# Patient Record
Sex: Female | Born: 1965
Health system: Southern US, Community
[De-identification: ages and names within clinical notes are randomized; demographics above are authoritative.]

## PROBLEM LIST (undated history)

## (undated) DIAGNOSIS — R7303 Prediabetes: Secondary | ICD-10-CM

## (undated) DIAGNOSIS — M25569 Pain in unspecified knee: Secondary | ICD-10-CM

## (undated) DIAGNOSIS — Z87442 Personal history of urinary calculi: Secondary | ICD-10-CM

## (undated) DIAGNOSIS — G2581 Restless legs syndrome: Secondary | ICD-10-CM

## (undated) DIAGNOSIS — T7840XA Allergy, unspecified, initial encounter: Secondary | ICD-10-CM

## (undated) DIAGNOSIS — R12 Heartburn: Secondary | ICD-10-CM

## (undated) DIAGNOSIS — G43909 Migraine, unspecified, not intractable, without status migrainosus: Secondary | ICD-10-CM

## (undated) DIAGNOSIS — Z96651 Presence of right artificial knee joint: Secondary | ICD-10-CM

## (undated) DIAGNOSIS — Z6841 Body Mass Index (BMI) 40.0 and over, adult: Secondary | ICD-10-CM

## (undated) DIAGNOSIS — G47419 Narcolepsy without cataplexy: Secondary | ICD-10-CM

## (undated) DIAGNOSIS — G4730 Sleep apnea, unspecified: Secondary | ICD-10-CM

## (undated) DIAGNOSIS — F32A Depression, unspecified: Secondary | ICD-10-CM

## (undated) DIAGNOSIS — M1990 Unspecified osteoarthritis, unspecified site: Secondary | ICD-10-CM

## (undated) DIAGNOSIS — M1712 Unilateral primary osteoarthritis, left knee: Secondary | ICD-10-CM

## (undated) DIAGNOSIS — R002 Palpitations: Secondary | ICD-10-CM

## (undated) DIAGNOSIS — H269 Unspecified cataract: Secondary | ICD-10-CM

## (undated) DIAGNOSIS — E559 Vitamin D deficiency, unspecified: Secondary | ICD-10-CM

## (undated) DIAGNOSIS — B001 Herpesviral vesicular dermatitis: Secondary | ICD-10-CM

## (undated) DIAGNOSIS — B002 Herpesviral gingivostomatitis and pharyngotonsillitis: Secondary | ICD-10-CM

## (undated) DIAGNOSIS — G8929 Other chronic pain: Secondary | ICD-10-CM

## (undated) DIAGNOSIS — I10 Essential (primary) hypertension: Secondary | ICD-10-CM

## (undated) HISTORY — DX: Herpesviral gingivostomatitis and pharyngotonsillitis: B00.2

## (undated) HISTORY — DX: Restless legs syndrome: G25.81

## (undated) HISTORY — DX: Unilateral primary osteoarthritis, left knee: M17.12

## (undated) HISTORY — DX: Narcolepsy without cataplexy: G47.419

## (undated) HISTORY — DX: Migraine, unspecified, not intractable, without status migrainosus: G43.909

## (undated) HISTORY — DX: Palpitations: R00.2

## (undated) HISTORY — DX: Unspecified osteoarthritis, unspecified site: M19.90

## (undated) HISTORY — DX: Sleep apnea, unspecified: G47.30

## (undated) HISTORY — DX: Vitamin D deficiency, unspecified: E55.9

## (undated) HISTORY — DX: Unspecified cataract: H26.9

## (undated) HISTORY — DX: Heartburn: R12

## (undated) HISTORY — DX: Pain in unspecified knee: M25.569

## (undated) HISTORY — DX: Morbid (severe) obesity due to excess calories: E66.01

## (undated) HISTORY — DX: Prediabetes: R73.03

## (undated) HISTORY — DX: Depression, unspecified: F32.A

## (undated) HISTORY — DX: Allergy, unspecified, initial encounter: T78.40XA

## (undated) HISTORY — DX: Herpesviral vesicular dermatitis: B00.1

## (undated) HISTORY — DX: Presence of right artificial knee joint: Z96.651

## (undated) HISTORY — PX: OTHER SURGICAL HISTORY: SHX169

## (undated) HISTORY — DX: Other chronic pain: G89.29

## (undated) HISTORY — DX: Body Mass Index (BMI) 40.0 and over, adult: Z684

## (undated) SURGERY — OOPHORECTOMY, LAPAROSCOPIC
Anesthesia: General

---

## 2003-12-08 ENCOUNTER — Other Ambulatory Visit: Payer: Self-pay

## 2004-08-12 ENCOUNTER — Ambulatory Visit: Payer: Self-pay | Admitting: Obstetrics and Gynecology

## 2004-10-03 ENCOUNTER — Ambulatory Visit: Payer: Self-pay | Admitting: Obstetrics and Gynecology

## 2005-03-14 ENCOUNTER — Ambulatory Visit: Payer: Self-pay | Admitting: Psychiatry

## 2005-04-02 ENCOUNTER — Ambulatory Visit: Payer: Self-pay | Admitting: Psychiatry

## 2007-01-21 DIAGNOSIS — D239 Other benign neoplasm of skin, unspecified: Secondary | ICD-10-CM

## 2007-01-21 HISTORY — DX: Other benign neoplasm of skin, unspecified: D23.9

## 2008-02-18 ENCOUNTER — Ambulatory Visit: Payer: Self-pay | Admitting: Obstetrics and Gynecology

## 2008-10-23 HISTORY — PX: ABDOMINAL HYSTERECTOMY: SHX81

## 2011-01-24 ENCOUNTER — Ambulatory Visit: Payer: Self-pay | Admitting: Family Medicine

## 2012-02-23 ENCOUNTER — Ambulatory Visit: Payer: Self-pay | Admitting: Orthopedic Surgery

## 2013-02-21 ENCOUNTER — Encounter: Payer: Self-pay | Admitting: Nurse Practitioner

## 2013-02-21 ENCOUNTER — Ambulatory Visit (INDEPENDENT_AMBULATORY_CARE_PROVIDER_SITE_OTHER): Payer: No Typology Code available for payment source | Admitting: Nurse Practitioner

## 2013-02-21 VITALS — BP 142/80 | HR 64 | Ht 64.0 in | Wt 231.0 lb

## 2013-02-21 DIAGNOSIS — G43009 Migraine without aura, not intractable, without status migrainosus: Secondary | ICD-10-CM

## 2013-02-21 DIAGNOSIS — G47419 Narcolepsy without cataplexy: Secondary | ICD-10-CM | POA: Insufficient documentation

## 2013-02-21 NOTE — Progress Notes (Signed)
HPI: Patient returns for followup after last visit with Dr. Vickey Huger on 06/26/2012. She has a history of narcolepsy. She has been unable to take Provigil. She is currently on Xyrem and doing well.  ROS:  Restless legs  Physical Exam General: well developed, well nourished, seated, in no evident distress Head: head normocephalic and atraumatic. Oropharynx benign Neck: supple with no carotid or supraclavicular bruits Cardiovascular: regular rate and rhythm, no murmurs  Neurologic Exam Mental Status: Awake and fully alert. Oriented to place and time. ESS 9. Cranial Nerves: Pupils equal, briskly reactive to light. Extraocular movements full without nystagmus. Visual fields full to confrontation. Hearing intact and symmetric to finger snap. Facial sensation intact. Face, tongue, palate move normally and symmetrically. Neck flexion and extension normal.  Motor: Normal bulk and tone. Normal strength in all tested extremity muscles. Sensory.: intact to touch and pinprick and vibratory.  Coordination: Rapid alternating movements normal in all extremities. Finger-to-nose and heel-to-shin performed accurately bilaterally. Gait and Station: Arises from chair without difficulty. Stance is normal. Gait demonstrates normal stride length and balance . Able to heel, toe and tandem walk without difficulty.  Reflexes: 1+ and symmetric. Toes downgoing.     ASSESSMENT: Narcolepsy without cataplexy.Epworth 9.    PLAN: Pt to continue Xyrem. ESS 9. Which has improved F/U in 6 months with Dr. Leander Rams Darrol Angel, GNP-BC APRN

## 2013-02-21 NOTE — Patient Instructions (Addendum)
Pt to continue Xyrem. ESS 9. Which has improved F/U in 6 months with Dr. Vickey Huger

## 2013-02-24 NOTE — Progress Notes (Signed)
I, Shirel Mallis, MD agree with assessment and management as outlined in this  note.   

## 2013-02-24 NOTE — Progress Notes (Signed)
Lazarus Salines, MD agree with assessment and management as outlined in this  note.

## 2013-03-03 ENCOUNTER — Telehealth: Payer: Self-pay | Admitting: Nurse Practitioner

## 2013-05-14 ENCOUNTER — Ambulatory Visit: Payer: Self-pay | Admitting: Obstetrics and Gynecology

## 2013-05-14 LAB — BASIC METABOLIC PANEL
Calcium, Total: 9.1 mg/dL (ref 8.5–10.1)
Chloride: 104 mmol/L (ref 98–107)
Co2: 28 mmol/L (ref 21–32)
Creatinine: 0.81 mg/dL (ref 0.60–1.30)
Glucose: 77 mg/dL (ref 65–99)
Osmolality: 273 (ref 275–301)
Potassium: 4.2 mmol/L (ref 3.5–5.1)

## 2013-05-14 LAB — CBC
HCT: 37.9 % (ref 35.0–47.0)
HGB: 12.7 g/dL (ref 12.0–16.0)
RBC: 4.58 10*6/uL (ref 3.80–5.20)
RDW: 14.1 % (ref 11.5–14.5)
WBC: 8.8 10*3/uL (ref 3.6–11.0)

## 2013-05-22 ENCOUNTER — Ambulatory Visit: Payer: Self-pay | Admitting: Obstetrics and Gynecology

## 2013-05-23 LAB — HEMATOCRIT: HCT: 34 % — ABNORMAL LOW (ref 35.0–47.0)

## 2013-05-23 LAB — BASIC METABOLIC PANEL
Calcium, Total: 8.4 mg/dL — ABNORMAL LOW (ref 8.5–10.1)
EGFR (African American): >60
Glucose: 68 mg/dL (ref 65–99)
Osmolality: 271 (ref 275–301)

## 2013-08-27 ENCOUNTER — Ambulatory Visit: Payer: No Typology Code available for payment source | Admitting: Neurology

## 2013-10-08 ENCOUNTER — Ambulatory Visit: Payer: No Typology Code available for payment source | Admitting: Neurology

## 2013-12-11 NOTE — Telephone Encounter (Signed)
Pt r/s apt closing encounter

## 2013-12-17 ENCOUNTER — Ambulatory Visit: Payer: No Typology Code available for payment source | Admitting: Neurology

## 2014-03-19 ENCOUNTER — Telehealth: Payer: Self-pay

## 2014-03-19 NOTE — Telephone Encounter (Signed)
Xyrem called regarding Rx.  They said the patient will not have ins after June 1 and would like to get auth to send refill before ins terminates.  I spoke with the pharmacist, Raven.  Gave verbal auth to refill med.  Patient has an appt scheduled in Sept.

## 2014-04-01 ENCOUNTER — Other Ambulatory Visit: Payer: Self-pay | Admitting: Neurology

## 2014-04-01 MED ORDER — SODIUM OXYBATE 500 MG/ML PO SOLN: 4500.0000 mg | Freq: Two times a day (BID) | ORAL | Status: DC

## 2014-06-24 ENCOUNTER — Ambulatory Visit (INDEPENDENT_AMBULATORY_CARE_PROVIDER_SITE_OTHER): Payer: No Typology Code available for payment source | Admitting: Neurology

## 2014-06-24 ENCOUNTER — Encounter: Payer: Self-pay | Admitting: Neurology

## 2014-06-24 VITALS — BP 121/67 | HR 68 | Resp 16 | Ht 64.0 in | Wt 238.0 lb

## 2014-06-24 DIAGNOSIS — R0609 Other forms of dyspnea: Secondary | ICD-10-CM

## 2014-06-24 DIAGNOSIS — G43909 Migraine, unspecified, not intractable, without status migrainosus: Secondary | ICD-10-CM

## 2014-06-24 DIAGNOSIS — R0989 Other specified symptoms and signs involving the circulatory and respiratory systems: Secondary | ICD-10-CM

## 2014-06-24 DIAGNOSIS — R0683 Snoring: Secondary | ICD-10-CM

## 2014-06-24 DIAGNOSIS — G47411 Narcolepsy with cataplexy: Secondary | ICD-10-CM

## 2014-06-24 DIAGNOSIS — E669 Obesity, unspecified: Secondary | ICD-10-CM

## 2014-06-24 DIAGNOSIS — D509 Iron deficiency anemia, unspecified: Secondary | ICD-10-CM

## 2014-06-24 DIAGNOSIS — G2581 Restless legs syndrome: Secondary | ICD-10-CM

## 2014-06-24 MED ORDER — RIZATRIPTAN BENZOATE 10 MG PO TABS: 10.0000 mg | ORAL_TABLET | ORAL | Status: DC | PRN

## 2014-06-24 NOTE — Patient Instructions (Signed)
I would like for her to repeat a sleep study at her convenience followed by an MS LT.  I know they will have to wean her off the XYREM  medication for at least 7 days prior to a sleep study to get a valid MSLT to follow .  Xyrem is already weaned. Alternatively I would like to look if there is any apnea present now since the patient has gained some weight over the last year , for today  I do a HLA narcolepsy panel.  This would not require the weaning off medication. There is high likelihood that she has apnea , she  snores now.  She is willing to wean off Zoloft to 25 mg daily and than in 14 days to 25 mg every other day for 8 days than off.    Polysomnography (Sleep Studies) Polysomnography (PSG) is a series of tests used for detecting (diagnosing) obstructive sleep apnea and other sleep disorders. The tests measure how some parts of your body are working while you are sleeping. The tests are extensive and expensive. They are done in a sleep lab or hospital, and vary from center to center. Your caregiver may perform other more simple sleep studies and questionnaires before doing more complete and involved testing. Testing may not be covered by insurance. Some of these tests are:  An EEG (Electroencephalogram). This tests your brain waves and stages of sleep.  An EOG (Electrooculogram). This measures the movements of your eyes. It detects periods of REM (rapid eye movement) sleep, which is your dream sleep.  An EKG (Electrocardiogram). This measures your heart rhythm.  EMG (Electromyography). This is a measurement of how the muscles are working in your upper airway and your legs while sleeping.  An oximetry measurement. It measures how much oxygen (air) you are getting while sleeping.  Breathing efforts may be measured. The same test can be interpreted (understood) differently by different caregivers and centers that study sleep.  Studies may be given an apnea/hypopnea index (AHI). This is a  number which is found by counting the times of no breathing or under breathing during the night, and relating those numbers to the amount of time spent in bed. When the AHI is greater than 15, the patient is likely to complain of daytime sleepiness. When the AHI is greater than 30, the patient is at increased risk for heart problems and must be followed more closely. Following the AHI also allows you to know how treatment is working. Simple oximetry (tracking the amount of oxygen that is taken in) can be used for screening patients who:  Do not have symptoms (problems) of OSA.  Have a normal Epworth Sleepiness Scale Score.  Have a low pre-test probability of having OSA.  Have none of the upper airway problems likely to cause apnea.  Oximetry is also used to determine if treatment is effective in patients who showed significant desaturations (not getting enough oxygen) on their home sleep study. One extra measure of safety is to perform additional studies for the person who only snores. This is because no one can predict with absolute certainty who will have OSA. Those who show significant desaturations (not getting enough oxygen) are recommended to have a more detailed sleep study. Document Released: 04/15/2003 Document Revised: 01/01/2012 Document Reviewed: 12/15/2013 [facility] Patient Information 2015 [place], [place]. This information is not intended to replace advice given to you by your health care provider. Make sure you discuss any questions you have with your health care provider. Marland Kitchen

## 2014-06-24 NOTE — Progress Notes (Signed)
SLEEP MEDICINE CLINIC   Provider:  Larey Seat, M D  Referring Provider: Dear, Trude Mcburney, MD Primary Care Physician:  Dear, Trude Mcburney, MD  Chief Complaint  Patient presents with  . Follow-up    Room 11  . narcolepsy  . Restless leg syndrome    HPI:  Amy Mooney is a 48 y.o. female  Is seen here as a  6 month  revisit  from Dr. Dear for narcolepsy follow up,  [name] was originally diagnosed with narcolepsy in the year 2008 in [place]. The patient had a polysomnography which at the time did not confirm apnea or restless legs. The study was followed 14 days later by an MSLT in [place], which was deemed to be abnormal.  however I have never been able to repeat the study -in the past it was not necessary,  the patient responded well to Xyrem,  after initially failing to respond to Provigil or Nuvigil  The patient endorsed the Epworth score at the upper limits at 22 and 19 points- now on Xyrem the  score is in the single digits.  I would like for her to repeat a sleep study at her convenience followed by an MS LT I know they will have to wean her off the medication for at least 7 days prior to a sleep study to get a valid MSLT to follow .  Xyrem is already weaned. Alternatively I would like to look if there is any apnea present now since the patient has gained some weight over the last year , for today  I do a HLA narcolepsy panel. This would not require the weaning off medication. There is high likelihood  that she has apnea , she  snores now. She is willing to wean off Zoloft to 25 mg daily and than in 14 days to 25 mg every other day.         Review of Systems: Out of a complete 14 system review, the patient complains of only the following symptoms, and all other reviewed systems are negative.   Epworth score 11  , Fatigue severity score: 29  , depression score 2 ,    History   Social History  . Marital Status: Married    Spouse Name: [name]    Number of Children: 2    . Years of Education: College   Occupational History  .     Social History Main Topics  . Smoking status: Never Smoker   . Smokeless tobacco: Never Used  . Alcohol Use: No  . Drug Use: No  . Sexual Activity: Not on file   Other Topics Concern  . Not on file   Social History Narrative   [name])   Patient has two adult children.   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   Patient has an college education.   Patient is right-handed.   Patient drinks very little caffeine.    Family History  Problem Relation Age of Onset  . Breast cancer Mother   . Diabetes Mother   . Breast cancer Paternal Grandmother     Past Medical History  Diagnosis Date  . Migraines   . Narcolepsy without cataplexy     Past Surgical History  Procedure Laterality Date  . Artroscopic meniscus repair      Current Outpatient Prescriptions  Medication Sig Dispense Refill  . rizatriptan (MAXALT) 10 MG tablet 10 mg as needed.      Marland Kitchen  sertraline (ZOLOFT) 50 MG tablet 50 mg at bedtime.      . Sodium Oxybate (XYREM) 500 MG/ML SOLN Take 9 mLs (4,500 mg total) by mouth 2 (two) times daily.  540 mL  3  . valACYclovir (VALTREX) 1000 MG tablet 1,000 mg as needed.       No current facility-administered medications for this visit.    Allergies as of 06/24/2014 - Review Complete 06/24/2014  Allergen Reaction Noted  . Nuvigil [armodafinil] Hives 02/21/2013  . Penicillins Rash 02/21/2013    Vitals: BP 121/67  Pulse 68  Resp 16  Ht 5' 4"  (1.626 m)  Wt 238 lb (107.956 kg)  BMI 40.83 kg/m2 Last Weight:  Wt Readings from Last 1 Encounters:  06/24/14 238 lb (107.956 kg)       Last Height:   Ht Readings from Last 1 Encounters:  06/24/14 5' 4"  (1.626 m)    Physical exam:  General: The patient is awake, alert and appears not in acute distress. The patient is well groomed. Head: Normocephalic, atraumatic. Neck is supple. Mallampati 3 ,  neck  circumference 15 inches . Nasal airflow unrestricted , TMJ is not  evident . Retrognathia is not seen.  Cardiovascular:  Regular rate and rhythm , without  murmurs or carotid bruit, and without distended neck veins. Respiratory: Lungs are clear to auscultation. Skin:  Without evidence of edema, or rash Trunk: BMI is elevated,  patient  has normal posture.  Neurologic exam : The patient is awake and alert, oriented to place and time.   Memory subjective  described as intact. There is a normal attention span & concentration ability. Speech is fluent without  dysarthria, dysphonia or aphasia. Mood and affect are appropriate.  Cranial nerves: Pupils are equal and briskly reactive to light.  Extraocular movements  in vertical and horizontal planes intact and without nystagmus. Visual fields by finger perimetry are intact. Hearing to finger rub intact.  Facial sensation intact to fine touch. Facial motor strength is symmetric and tongue and uvula move midline.  Motor exam:  Normal tone ,muscle bulk and symmetric ,strength in all extremities.  Sensory:  Fine touch, pinprick and vibration were  normal.  Coordination: Rapid alternating movements in the fingers/hands is normal. Finger-to-nose maneuver  normal without evidence of ataxia, dysmetria or tremor.  Gait and station: Patient walks without assistive device. Deep tendon reflexes: in the  upper and lower extremities are symmetric and intact. Babinski maneuver response is downgoing.   Assessment:  After physical and neurologic examination, review of laboratory studies, imaging, neurophysiology testing and pre-existing records, assessment is   1)Narcolepsy by clinic and MSLT 2008, no recent cataplexy - XYREM therapy was interrupted as the patients 73 year old grandfather lives now with her and needs assistance.  I like for her to resume regular intake pattern.  Fatigue severity score 29 and Epworth score 11.  2) Migraine - since she has slept  less at night and is sleepier in daytime , her migraines have increased. 3) RLS , feet constantly moving when at rest. Not painful but aggravating. Creepy crawly sensation with myoclonic jerks.      The patient was advised of the nature of the diagnosed sleep disorder , the treatment options and risks for general a health and wellness arising from not treating the condition. Visit duration was 45 minutes.   Plan:  Treatment plan and additional workup : 1) Restart XYREM full daose, from the current irregular intake.  2) Ferritin and TIBC level.  CK and CKMB and sed rate.  3) migraines are bound to get better with better sleep. She wakes up with headaches. She has gained weight off Xyrem , and she snores. SPLIT study with MSLT to follow ordered. CO2 needed.   No xytrem for 14 days and no Zoloft.      Asencion Partridge Tarik Teixeira MD  06/24/2014

## 2014-06-25 LAB — CK TOTAL AND CKMB (NOT AT ARMC)
CK-MB Index: 3.4 ng/mL (ref 0.0–5.3)
Total CK: 171 U/L (ref 24–173)

## 2014-06-25 LAB — FERRITIN: Ferritin: 130 ng/mL (ref 15–150)

## 2014-06-25 LAB — IRON AND TIBC
IRON SATURATION: 15 % (ref 15–55)
Iron: 50 ug/dL (ref 35–155)
TIBC: 328 ug/dL (ref 250–450)
UIBC: 278 ug/dL (ref 150–375)

## 2014-07-02 ENCOUNTER — Telehealth: Payer: Self-pay | Admitting: *Deleted

## 2014-07-02 MED ORDER — PRAMIPEXOLE DIHYDROCHLORIDE 0.25 MG PO TABS: 0.2500 mg | ORAL_TABLET | Freq: Three times a day (TID) | ORAL | Status: DC

## 2014-07-02 NOTE — Telephone Encounter (Signed)
Patient was given her lab results and wants to know from Dr. Brett Fairy if she is going to wait until after her sleep study to prescribe a medication for her restless legs.  Please advise.

## 2014-07-02 NOTE — Telephone Encounter (Signed)
i will prescribe 0.25 mg of restless leg medication now, that we know it's not an iron deficiency.  Prescription faxed , take one pill 1 hour to onset of symptoms.

## 2014-07-02 NOTE — Telephone Encounter (Signed)
Message copied by Fran Lowes on Thu Jul 02, 2014  1:44 PM ------      Message from: [facility], [place]      Created: Wed Jul 01, 2014  4:22 PM       normal iron levels. ------

## 2014-07-03 ENCOUNTER — Telehealth: Payer: Self-pay | Admitting: Neurology

## 2014-07-03 NOTE — Telephone Encounter (Signed)
Patient called back and would like the doctor to call her and explain about the HLA typing test.

## 2014-07-03 NOTE — Telephone Encounter (Signed)
It means the patient is not genetically pres disposed to narcolepsy . It cannot rule it 100% in or out.

## 2014-07-03 NOTE — Telephone Encounter (Signed)
Patient was notified that the rx will be sent to the pharmacy.  Patient  Verbalized understanding.

## 2014-07-03 NOTE — Telephone Encounter (Signed)
Left message that blood work for HLA typing for narcolepsy came back negative.   

## 2014-07-06 NOTE — Telephone Encounter (Signed)
Spoke to patient and relayed that the HLA typing result means that she is not genetically predisposed to narcolepsy, but it cannot rule it 100% in or out, per Dr. Brett Fairy.

## 2014-07-30 ENCOUNTER — Ambulatory Visit (INDEPENDENT_AMBULATORY_CARE_PROVIDER_SITE_OTHER): Payer: No Typology Code available for payment source

## 2014-07-30 DIAGNOSIS — R0902 Hypoxemia: Secondary | ICD-10-CM

## 2014-07-30 DIAGNOSIS — G4733 Obstructive sleep apnea (adult) (pediatric): Secondary | ICD-10-CM

## 2014-07-30 DIAGNOSIS — G47411 Narcolepsy with cataplexy: Secondary | ICD-10-CM

## 2014-07-30 DIAGNOSIS — R0683 Snoring: Secondary | ICD-10-CM

## 2014-07-30 DIAGNOSIS — E669 Obesity, unspecified: Secondary | ICD-10-CM

## 2014-08-11 ENCOUNTER — Telehealth: Payer: Self-pay | Admitting: *Deleted

## 2014-08-11 NOTE — Telephone Encounter (Signed)
Patient was called and given the results of her overnight sleep study in which obstructive sleep apnea was present.  Patient was intolerant the night of the study and would not comply to using a CPAP mask.  Patient is unwilling to schedule a CPAP titration study but is willing to come back for a follow up visit to discuss other treatment options. A copy of the results were mailed to the patient and a copy was sent to her Physicians office.  Patient stated she would call our office back to schedule the follow up visit.

## 2015-02-12 NOTE — Op Note (Signed)
PATIENT NAME:  Amy Mooney, PAWLING MR#:  [id number] DATE OF BIRTH:  [birth year]  DATE OF PROCEDURE:  05/23/2013  PREOPERATIVE DIAGNOSES:  Cervical stump prolapse, stress urinary incontinence, prolapsed bladder cystocele.  POSTOPERATIVE DIAGNOSES:  Cervical stump prolapse, stress urinary incontinence, prolapsed bladder cystocele, bladder mass.    ESTIMATED BLOOD LOSS:  Approximately 75 mL.   SURGEON:  Delsa Sale, M.D.   PROCEDURE:  Trachelectomy with the intraperitoneal colpopexy with uterosacral ligament plication, anterior colporrhaphy, placement of an Exact TVT sling, cystoscopy and biopsy of the bladder.   ANESTHESIA:  General endotracheal and local.   DESCRIPTION OF PROCEDURE:  The patient was taken to the operating room and placed in the supine position.  After adequate general endotracheal anesthesia was instilled, the patient was prepped and draped in the usual sterile fashion.  A weighted speculum was placed in the patient's vagina and the cervical stump was grasped with a cat claw grasper.  The uterosacrals are massaged to lengthen the cervical tissue.  Lidocaine with epinephrine was injected circumferentially around the cervix.  A knife was used to cut a circular edge of the tissue circumferentially around the cervix.  This was pushed off with the surgeon's finger and a moist open Ray-Tec.  Metzenbaum scissors were then used to sharply dissect the vaginal tissue off of the cervix.  The bladder fold was identified and the Metzenbaum's were used to enter the peritoneum and were spread to open the incision.  A lighted Deaver was placed into the abdomen through this incision.  Attention was then turned to the posterior aspect of the cervix which was grasped, cut and entered sharply.  The long weighted speculum was placed to the peritoneum.  Curved Heaneys were used to take serial bites up the sides of the cervical stump.  These were clamped, cut and suture ligated with Vicryl suture.  The cervical  stump was then removed and the Ethibond suture was used to approximate the uterosacrals by doing a circumferential suture from right uterosacral, posterior peritoneum, left uterosacral, anterior peritoneum and tying this down together.  Serial sutures were then used to approximate the vaginal mucosa.  The cystocele was then injected with superficial lidocaine.  An incision was made and the bladder was peeled off of the vaginal mucosa.  Three sutures were placed to plicate the tissue together in the midline.  The excess vaginal mucosal tissue was removed.  Attention was then turned to the urethra where an incision was made underneath, approximately 1 cm from the urethral opening.  This was then separated from the urethra and the vaginal mucosa to the pubic bone.  The trocars were then placed through this incision after diverting the bladder first to the left, then to the right while placing the right and the left trocar of the TVT Exact.  The sling placement was placed with a Hegar, ensuring that there was enough room between the Hegar dilator and the urethra.  It was placed in the urethral area.  The Foley bulb was used to find the bladder neck.  This was then placed up through the skin edges.  The plastic covering was removed and the tape was cut flush with the skin edge.  Cystoscopy was performed and efflux from ureters bilaterally was seen.  This time an unusual amount of tissue in the midline was identified.  It was biopsied and sent to pathology.  Good hemostasis was identified.  The vaginal mucosa incision was closed with a running Monocryl suture.  All sutures  were cut.  The vagina was found to have good support and good depth.  Good hemostasis was identified.  The patient's vagina was then packed with a Premarin-soaked Kerlix.  Foley catheter was placed back into the bladder.  The patient was then laid supine and taken to recovery after having tolerated the procedure well.      ____________________________ Delsa Sale, MD cck:ea D: 05/23/2013 00:21:00 ET T: 05/23/2013 01:10:04 ET JOB#: [id number]  cc: Delsa Sale, MD, <Dictator> Delsa Sale MD ELECTRONICALLY SIGNED 05/23/2013 13:02

## 2015-02-26 ENCOUNTER — Other Ambulatory Visit: Payer: Self-pay | Admitting: Neurology

## 2015-02-26 NOTE — Telephone Encounter (Signed)
Per note on 09/10

## 2015-12-23 ENCOUNTER — Ambulatory Visit
Admission: EM | Admit: 2015-12-23 | Discharge: 2015-12-23 | Disposition: A | Discharge status: Home / Self Care | Payer: 59 | Attending: Family Medicine | Admitting: Family Medicine

## 2015-12-23 ENCOUNTER — Encounter: Payer: Self-pay | Admitting: Emergency Medicine

## 2015-12-23 DIAGNOSIS — J9801 Acute bronchospasm: Secondary | ICD-10-CM | POA: Diagnosis not present

## 2015-12-23 DIAGNOSIS — B349 Viral infection, unspecified: Secondary | ICD-10-CM

## 2015-12-23 MED ORDER — HYDROCOD POLST-CPM POLST ER 10-8 MG/5ML PO SUER: 5.0000 mL | Freq: Every evening | ORAL | Status: AC | PRN

## 2015-12-23 MED ORDER — ALBUTEROL SULFATE HFA 108 (90 BASE) MCG/ACT IN AERS: 2.0000 | INHALATION_SPRAY | RESPIRATORY_TRACT | Status: DC | PRN

## 2015-12-23 MED ORDER — BENZONATATE 100 MG PO CAPS: 100.0000 mg | ORAL_CAPSULE | Freq: Three times a day (TID) | ORAL | Status: DC | PRN

## 2015-12-23 NOTE — ED Notes (Addendum)
Pt reports cough and congestion that started on Monday denies known fever, sore throat, or ear pain

## 2015-12-23 NOTE — ED Provider Notes (Signed)
[facility]  ____________________________________________  Time seen: Approximately 9:36 AM  I have reviewed the triage vital signs and the nursing notes.   HISTORY  Chief Complaint Cough   HPI Amy Mooney is a 50 y.o. female presents for complaints of 2-3 days of cough and congestion. States mild nasal congestion. Denies sore throat or fever. Denies known sick contacts. Reports works at a [employer]. States cough is mostly a dry cough. States she feels like she intermittently will wheeze and states mostly at night. Reports continues to eat and drink well.   Denies chest pain, shortness of breath, abdominal pain, neck pain, back pain, rash. Denies history of wheezing when sick.   Past Medical History  Diagnosis Date  . Migraines   . Narcolepsy without cataplexy    Denies chance of pregnancy  Patient Active Problem List   Diagnosis Date Noted  . Severe obesity (BMI >= 40) ([place]) 06/24/2014  . Migraine without aura, without mention of intractable migraine without mention of status migrainosus 02/21/2013  . Narcolepsy without cataplexy 02/21/2013    Past Surgical History  Procedure Laterality Date  . Artroscopic meniscus repair      Current Outpatient Rx  Name  Route  Sig  Dispense  Refill  . pramipexole (MIRAPEX) 0.25 MG tablet      TAKE ONE TABLET BY MOUTH THREE TIMES DAILY   90 tablet   1   . rizatriptan (MAXALT) 10 MG tablet   Oral   Take 1 tablet (10 mg total) by mouth as needed for migraine.   10 tablet   5   . sertraline (ZOLOFT) 50 MG tablet      50 mg at bedtime.         . Sodium Oxybate (XYREM) 500 MG/ML SOLN   Oral   Take 9 mLs (4,500 mg total) by mouth 2 (two) times daily.   540 mL   3   . valACYclovir (VALTREX) 1000 MG tablet      1,000 mg as needed.           Allergies Nuvigil and Penicillins  Family History  Problem Relation Age of Onset  . Breast cancer Mother   . Diabetes Mother   . Breast cancer  Paternal Grandmother     Social History Social History  Substance Use Topics  . Smoking status: Never Smoker   . Smokeless tobacco: Never Used  . Alcohol Use: No    Review of Systems Constitutional: No fever/chills Eyes: No visual changes. ENT: No sore throat. Positive cough and congestion. Cardiovascular: Denies chest pain. Respiratory: Denies shortness of breath. Gastrointestinal: No abdominal pain.  No nausea, no vomiting.  No diarrhea.  No constipation. Genitourinary: Negative for dysuria. Musculoskeletal: Negative for back pain. Skin: Negative for rash. Neurological: Negative for headaches, focal weakness or numbness.  10-point ROS otherwise negative.  ____________________________________________   PHYSICAL EXAM:  VITAL SIGNS: ED Triage Vitals  Enc Vitals Group     BP 12/23/15 0853 152/87 mmHg     Pulse Rate 12/23/15 0853 82     Resp 12/23/15 0853 16     Temp 12/23/15 0853 97.5 F (36.4 C)     Temp Source 12/23/15 0853 Tympanic     SpO2 12/23/15 0853 99 %     Weight 12/23/15 0853 229 lb (103.874 kg)     Height 12/23/15 0853 5\' 4"  (1.626 m)     Head Cir --      Peak Flow --  Pain Score --      Pain Loc --      Pain Edu? --      Excl. in [place]? --   Constitutional: Alert and oriented. Well appearing and in no acute distress. Eyes: Conjunctivae are normal. PERRL. EOMI. Head: Atraumatic. No sinus tenderness to palpation. No swelling. No erythema.  Ears: no erythema, normal TMs bilaterally.   Nose: Mild Nasal congestion with clear rhinorrhea  Mouth/Throat: Mucous membranes are moist. No pharyngeal erythema. No tonsillar swelling or exudate.  Neck: No stridor.  No cervical spine tenderness to palpation. Hematological/Lymphatic/Immunilogical: No cervical lymphadenopathy. Cardiovascular: Normal rate, regular rhythm. Grossly normal heart sounds.  Good peripheral circulation. Respiratory: Normal respiratory effort.  No retractions. Lungs CTAB.No wheezes, rales or  rhonchi. Good air movement. Dry intermittent cough noted in room, mild wheeze bronchospasm noted with cough. Gastrointestinal: Soft and nontender. Normal Bowel sounds. No CVA tenderness. Musculoskeletal: No lower or upper extremity tenderness nor edema. No cervical, thoracic or lumbar tenderness to palpation. Neurologic:  Normal speech and language. No gross focal neurologic deficits are appreciated. No gait instability. Skin:  Skin is warm, dry and intact. No rash noted. Psychiatric: Mood and affect are normal. Speech and behavior are normal.  ____________________________________________   LABS (all labs ordered are listed, but only abnormal results are displayed)  Labs Reviewed - No data to display  INITIAL IMPRESSION / ASSESSMENT AND PLAN / ED COURSE  Pertinent labs & imaging results that were available during my care of the patient were reviewed by me and considered in my medical decision making (see chart for details).  Very well-appearing patient. No acute distress. Presents with complaints of to 3 days of some runny nose, chest congestion and cough. Denies fevers. Reports he is eating and drink well. Patient states chief complaint is cough. Lungs clear throughout, however with slight bronchospasm noted with cough. Abdomen soft and nontender. Suspect viral upper respiratory infection with bronchitis with bronchospasm. Will treat supportively and symptomatically. Will treat with oral when necessary Tussionex at night, Tessalon Perles during the day as well as pro-air albuterol inhaler as needed. Reports has taken tussionex in past and tolerated well. Encourage PCP follow up.Discussed indication, risks and benefits of medications with patient.  Discussed follow up with Primary care physician this week. Discussed follow up and return parameters including no resolution or any worsening concerns. Patient verbalized understanding and agreed to plan.    ____________________________________________   FINAL CLINICAL IMPRESSION(S) / ED DIAGNOSES  Final diagnoses:  Acute bronchospasm due to viral infection      Note: This dictation was prepared with Dragon dictation along with smaller phrase technology. Any transcriptional errors that result from this process are unintentional.    Marylene Land, NP 12/23/15 1015

## 2015-12-23 NOTE — Discharge Instructions (Signed)
Take medication as prescribed. Rest. Drink plenty of fluids.   Follow up with your primary care physician this week as needed. Return to Urgent care for new or worsening concerns.    Bronchospasm, Adult A bronchospasm is a spasm or tightening of the airways going into the lungs. During a bronchospasm breathing becomes more difficult because the airways get smaller. When this happens there can be coughing, a whistling sound when breathing (wheezing), and difficulty breathing. Bronchospasm is often associated with asthma, but not all patients who experience a bronchospasm have asthma. CAUSES  A bronchospasm is caused by inflammation or irritation of the airways. The inflammation or irritation may be triggered by:   Allergies (such as to animals, pollen, food, or mold). Allergens that cause bronchospasm may cause wheezing immediately after exposure or many hours later.   Infection. Viral infections are believed to be the most common cause of bronchospasm.   Exercise.   Irritants (such as pollution, cigarette smoke, strong odors, aerosol sprays, and paint fumes).   Weather changes. Winds increase molds and pollens in the air. Rain refreshes the air by washing irritants out. Cold air may cause inflammation.   Stress and emotional upset.  SIGNS AND SYMPTOMS   Wheezing.   Excessive nighttime coughing.   Frequent or severe coughing with a simple cold.   Chest tightness.   Shortness of breath.  DIAGNOSIS  Bronchospasm is usually diagnosed through a history and physical exam. Tests, such as chest X-rays, are sometimes done to look for other conditions. TREATMENT   Inhaled medicines can be given to open up your airways and help you breathe. The medicines can be given using either an inhaler or a nebulizer machine.  Corticosteroid medicines may be given for severe bronchospasm, usually when it is associated with asthma. HOME CARE INSTRUCTIONS   Always have a plan prepared for  seeking medical care. Know when to call your health care provider and local emergency services (911 in the [place] Know where you can access local emergency care.  Only take medicines as directed by your health care provider.  If you were prescribed an inhaler or nebulizer machine, ask your health care provider to explain how to use it correctly. Always use a spacer with your inhaler if you were given one.  It is necessary to remain calm during an attack. Try to relax and breathe more slowly.  Control your home environment in the following ways:   Change your heating and air conditioning filter at least once a month.   Limit your use of fireplaces and wood stoves.  Do not smoke and do not allow smoking in your home.   Avoid exposure to perfumes and fragrances.   Get rid of pests (such as roaches and mice) and their droppings.   Throw away plants if you see mold on them.   Keep your house clean and dust free.   Replace carpet with wood, tile, or vinyl flooring. Carpet can trap dander and dust.   Use allergy-proof pillows, mattress covers, and box spring covers.   Wash bed sheets and blankets every week in hot water and dry them in a dryer.   Use blankets that are made of polyester or cotton.   Wash hands frequently. SEEK MEDICAL CARE IF:   You have muscle aches.   You have chest pain.   The sputum changes from clear or white to yellow, green, gray, or bloody.   The sputum you cough up gets thicker.   There are  problems that may be related to the medicine you are given, such as a rash, itching, swelling, or trouble breathing.  SEEK IMMEDIATE MEDICAL CARE IF:   You have worsening wheezing and coughing even after taking your prescribed medicines.   You have increased difficulty breathing.   You develop severe chest pain. MAKE SURE YOU:   Understand these instructions.  Will watch your condition.  Will get help right away if you are not doing well  or get worse.   This information is not intended to replace advice given to you by your health care provider. Make sure you discuss any questions you have with your health care provider.   Document Released: 10/12/2003 Document Revised: 10/30/2014 Document Reviewed: 03/31/2013 Elsevier Interactive Patient Education 2016 Elsevier Inc.  Viral Infections A virus is a type of germ. Viruses can cause:  Minor sore throats.  Aches and pains.  Headaches.  Runny nose.  Rashes.  Watery eyes.  Tiredness.  Coughs.  Loss of appetite.  Feeling sick to your stomach (nausea).  Throwing up (vomiting).  Watery poop (diarrhea). HOME CARE   Only take medicines as told by your doctor.  Drink enough water and fluids to keep your pee (urine) clear or pale yellow. Sports drinks are a good choice.  Get plenty of rest and eat healthy. Soups and broths with crackers or rice are fine. GET HELP RIGHT AWAY IF:   You have a very bad headache.  You have shortness of breath.  You have chest pain or neck pain.  You have an unusual rash.  You cannot stop throwing up.  You have watery poop that does not stop.  You cannot keep fluids down.  You or your child has a temperature by mouth above 102 F (38.9 C), not controlled by medicine.  Your baby is older than 3 months with a rectal temperature of 102 F (38.9 C) or higher.  Your baby is 80 months old or younger with a rectal temperature of 100.4 F (38 C) or higher. MAKE SURE YOU:   Understand these instructions.  Will watch this condition.  Will get help right away if you are not doing well or get worse.   This information is not intended to replace advice given to you by your health care provider. Make sure you discuss any questions you have with your health care provider.   Document Released: 09/21/2008 Document Revised: 01/01/2012 Document Reviewed: 03/17/2015 Elsevier Interactive Patient Education [employer].

## 2016-01-03 DIAGNOSIS — R030 Elevated blood-pressure reading, without diagnosis of hypertension: Secondary | ICD-10-CM | POA: Diagnosis not present

## 2016-03-10 ENCOUNTER — Ambulatory Visit: Payer: 59 | Admitting: Primary Care

## 2016-03-17 ENCOUNTER — Ambulatory Visit (INDEPENDENT_AMBULATORY_CARE_PROVIDER_SITE_OTHER): Payer: 59 | Admitting: Primary Care

## 2016-03-17 ENCOUNTER — Encounter: Payer: Self-pay | Admitting: Primary Care

## 2016-03-17 VITALS — BP 144/82 | HR 75 | Temp 97.6°F | Ht 63.0 in | Wt 231.0 lb

## 2016-03-17 DIAGNOSIS — M25569 Pain in unspecified knee: Secondary | ICD-10-CM

## 2016-03-17 DIAGNOSIS — R030 Elevated blood-pressure reading, without diagnosis of hypertension: Secondary | ICD-10-CM

## 2016-03-17 DIAGNOSIS — R0683 Snoring: Secondary | ICD-10-CM

## 2016-03-17 DIAGNOSIS — G43001 Migraine without aura, not intractable, with status migrainosus: Secondary | ICD-10-CM | POA: Diagnosis not present

## 2016-03-17 DIAGNOSIS — G47411 Narcolepsy with cataplexy: Secondary | ICD-10-CM

## 2016-03-17 DIAGNOSIS — G2581 Restless legs syndrome: Secondary | ICD-10-CM

## 2016-03-17 DIAGNOSIS — E669 Obesity, unspecified: Secondary | ICD-10-CM

## 2016-03-17 DIAGNOSIS — G8929 Other chronic pain: Secondary | ICD-10-CM

## 2016-03-17 DIAGNOSIS — IMO0001 Reserved for inherently not codable concepts without codable children: Secondary | ICD-10-CM

## 2016-03-17 MED ORDER — PRAMIPEXOLE DIHYDROCHLORIDE 0.25 MG PO TABS: ORAL_TABLET | ORAL | Status: DC

## 2016-03-17 MED ORDER — RIZATRIPTAN BENZOATE 10 MG PO TABS: ORAL_TABLET | ORAL | Status: DC

## 2016-03-17 NOTE — Patient Instructions (Signed)
I sent refills of your medications to your pharmacy.  Please schedule a physical with me within the next 3 months. You may also schedule a lab only appointment 3-4 days prior. We will discuss your lab results in detail during your physical.  It was a pleasure to meet you today! Please don't hesitate to call me with any questions. Welcome to [facility]!

## 2016-03-17 NOTE — Progress Notes (Signed)
Pre visit review using our clinic review tool, if applicable. No additional management support is needed unless otherwise documented below in the visit note. 

## 2016-03-17 NOTE — Progress Notes (Signed)
Subjective:    Patient ID: [name], female    DOB: September 03, 1966, 50 y.o.   MRN: [id number]  HPI  Ms. Wanko is a 50 year old female who presents today to establish care and discuss the problems mentioned below. Will obtain old records. Her last physical was over 1 year ago.   1) Migraines: Currently managed on Maxalt 10 mg as needed for Migraines. Migraines occur sporadically. Present since age 28. She's had migraines twice this week as she is under increased stress, but typically does not experience migraines often.   2) Restless Leg: Present for several years. Currently managed on Mirapex 0.25 mg. Feels well managed on this medication at this dose. She is requesting refills today.   3) Obesity: Managed on Phentermine 37.5 and Topamax 25 for the past 4-5 months. She's lost 20 pounds since being on this medication regimen. She recently started participating in a nutrition class through [facility]. Denies palpitations, chest pain.  4) Oral herpes: Currently managed on Valtrex 1000 mg as needed. Rare outbreaks. Managed by dermatology.  5) Elevated Blood Pressure: Slightly above goal today at 144/80 today. She checks her BP at work and at home at gets readings of 140's/60's. Denies chest pain, changes in vision, dizziness.   6) Chronic knee pain: Present for numerous years. She has a history of meniscal tears to bilateral knees. Takes meloxicam as needed very sparingly.   Review of Systems  Respiratory: Negative for shortness of breath.   Cardiovascular: Negative for chest pain and palpitations.  Musculoskeletal:       Chronic bilateral knee pain  Neurological: Positive for headaches. Negative for dizziness.       Past Medical History  Diagnosis Date  . Migraines   . Narcolepsy without cataplexy   . Restless leg      Social History   Social History  . Marital Status: Married    Spouse Name: [name]  . Number of Children: 2  . Years of Education: College   Occupational  History  .     Social History Main Topics  . Smoking status: Never Smoker   . Smokeless tobacco: Never Used  . Alcohol Use: No  . Drug Use: No  . Sexual Activity: Not on file   Other Topics Concern  . Not on file   Social History Narrative   [name])   Patient has two adult children.   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   Patient has an college education.   Patient is right-handed.   Patient drinks very little caffeine.   Enjoys spending time with family.     Past Surgical History  Procedure Laterality Date  . Artroscopic meniscus repair    . Abdominal hysterectomy  2010    Family History  Problem Relation Age of Onset  . Breast cancer Mother   . Diabetes Mother   . Breast cancer Paternal Grandmother     Allergies  Allergen Reactions  . Nuvigil [Armodafinil] Hives  . Penicillins Rash    Current Outpatient Prescriptions on File Prior to Visit  Medication Sig Dispense Refill  . valACYclovir (VALTREX) 1000 MG tablet 1,000 mg as needed.     No current facility-administered medications on file prior to visit.    BP 144/82 mmHg  Pulse 75  Temp(Src) 97.6 F (36.4 C) (Oral)  Ht 5\' 3"  (1.6 m)  Wt 231 lb (104.781 kg)  BMI 40.93 kg/m2  SpO2 99%  Objective:   Physical Exam  Constitutional: She appears well-nourished.  Neck: Neck supple.  Cardiovascular: Normal rate and regular rhythm.   Pulmonary/Chest: Effort normal and breath sounds normal.  Skin: Skin is warm and dry.  Psychiatric: She has a normal mood and affect.          Assessment & Plan:

## 2016-03-19 ENCOUNTER — Encounter: Payer: Self-pay | Admitting: Primary Care

## 2016-03-19 DIAGNOSIS — G8929 Other chronic pain: Secondary | ICD-10-CM | POA: Insufficient documentation

## 2016-03-19 DIAGNOSIS — M25569 Pain in unspecified knee: Secondary | ICD-10-CM

## 2016-03-19 DIAGNOSIS — G2581 Restless legs syndrome: Secondary | ICD-10-CM | POA: Insufficient documentation

## 2016-03-19 DIAGNOSIS — IMO0001 Reserved for inherently not codable concepts without codable children: Secondary | ICD-10-CM | POA: Insufficient documentation

## 2016-03-19 DIAGNOSIS — R030 Elevated blood-pressure reading, without diagnosis of hypertension: Secondary | ICD-10-CM

## 2016-03-19 NOTE — Assessment & Plan Note (Signed)
Managed on Maxalt as needed, typically migraines are infrequent. Refill provided today.

## 2016-03-19 NOTE — Assessment & Plan Note (Signed)
Managed on Mirapex, feels well managed. Refill provided today.

## 2016-03-19 NOTE — Assessment & Plan Note (Signed)
Likely due to a combination of arthritis and morbid obesity. Discussed importance of weight loss.

## 2016-03-19 NOTE — Assessment & Plan Note (Signed)
Managed on Topamax and phentermine. Discussed that I do not manage weight loss medications, she is okay with this and will stop taking after her current RX runs out. Discussed obtaining monitoring/refills at [facility] in [place]. Recently joined a nutrition class through [facility].

## 2016-03-19 NOTE — Assessment & Plan Note (Signed)
Slightly above goal today, endorses home/work readings about the same. Will continue to monitor, have her work on [employer] and weight loss. If continued elevation, will initiate low dose HCTZ.

## 2016-03-22 ENCOUNTER — Other Ambulatory Visit: Payer: Self-pay | Admitting: Primary Care

## 2016-03-22 DIAGNOSIS — Z0000 Encounter for general adult medical examination without abnormal findings: Secondary | ICD-10-CM

## 2016-03-24 ENCOUNTER — Other Ambulatory Visit (INDEPENDENT_AMBULATORY_CARE_PROVIDER_SITE_OTHER): Payer: 59

## 2016-03-24 DIAGNOSIS — Z0000 Encounter for general adult medical examination without abnormal findings: Secondary | ICD-10-CM

## 2016-03-24 LAB — VITAMIN D 25 HYDROXY (VIT D DEFICIENCY, FRACTURES): VITD: 38.15 ng/mL (ref 30.00–100.00)

## 2016-03-24 LAB — TSH: TSH: 1.7 u[IU]/mL (ref 0.35–4.50)

## 2016-03-24 LAB — COMPREHENSIVE METABOLIC PANEL
ALBUMIN: 4.2 g/dL (ref 3.5–5.2)
ALK PHOS: 100 U/L (ref 39–117)
ALT: 22 U/L (ref 0–35)
AST: 19 U/L (ref 0–37)
BUN: 20 mg/dL (ref 6–23)
CALCIUM: 9.2 mg/dL (ref 8.4–10.5)
CHLORIDE: 103 meq/L (ref 96–112)
CO2: 26 mEq/L (ref 19–32)
Creatinine, Ser: 0.75 mg/dL (ref 0.40–1.20)
GFR: 87.02 mL/min (ref >60.00)
Glucose, Bld: 92 mg/dL (ref 70–99)
POTASSIUM: 3.6 meq/L (ref 3.5–5.1)
SODIUM: 139 meq/L (ref 135–145)
TOTAL PROTEIN: 7.2 g/dL (ref 6.0–8.3)
Total Bilirubin: 0.6 mg/dL (ref 0.2–1.2)

## 2016-03-24 LAB — CBC
HEMATOCRIT: 39.1 % (ref 36.0–46.0)
HEMOGLOBIN: 12.9 g/dL (ref 12.0–15.0)
MCHC: 33 g/dL (ref 30.0–36.0)
MCV: 82.2 fl (ref 78.0–100.0)
Platelets: 232 10*3/uL (ref 150.0–400.0)
RBC: 4.75 Mil/uL (ref 3.87–5.11)
RDW: 14.9 % (ref 11.5–15.5)
WBC: 9.8 10*3/uL (ref 4.0–10.5)

## 2016-03-24 LAB — LIPID PANEL
CHOL/HDL RATIO: 3
Cholesterol: 134 mg/dL (ref 0–200)
HDL: 43.9 mg/dL (ref >39.00)
LDL Cholesterol: 73 mg/dL (ref 0–99)
NONHDL: 90.29
Triglycerides: 84 mg/dL (ref 0.0–149.0)
VLDL: 16.8 mg/dL (ref 0.0–40.0)

## 2016-03-24 LAB — HEMOGLOBIN A1C: HEMOGLOBIN A1C: 5.7 % (ref 4.6–6.5)

## 2016-03-30 ENCOUNTER — Ambulatory Visit (INDEPENDENT_AMBULATORY_CARE_PROVIDER_SITE_OTHER): Payer: 59 | Admitting: Primary Care

## 2016-03-30 ENCOUNTER — Encounter: Payer: Self-pay | Admitting: Primary Care

## 2016-03-30 VITALS — BP 132/88 | HR 70 | Temp 97.7°F | Ht 63.0 in | Wt 230.1 lb

## 2016-03-30 DIAGNOSIS — Z1239 Encounter for other screening for malignant neoplasm of breast: Secondary | ICD-10-CM

## 2016-03-30 DIAGNOSIS — Z23 Encounter for immunization: Secondary | ICD-10-CM | POA: Diagnosis not present

## 2016-03-30 DIAGNOSIS — Z0000 Encounter for general adult medical examination without abnormal findings: Secondary | ICD-10-CM | POA: Diagnosis not present

## 2016-03-30 NOTE — Progress Notes (Signed)
Subjective:    Patient ID: Amy Mooney, female    DOB: 01/20/1966, 50 y.o.   MRN: [id number]  HPI  Amy Mooney is a 50 year old female who presents today for complete physical.  Immunizations: -Tetanus: Unsure, believes it was in 2007. Nothing in [facility] or Epic. -Influenza: Did receive last season.   Diet: She endorses a fair diet. Breakfast: Protein shake Lunch: Catered lunch (3-4 days weekly), cereal Dinner: Meat, vegetable Snacks: Occasionally  Desserts: Occasionally  Beverages: Water, sweet tea  Exercise: She is not currently exercising Eye exam: Completed in January 2017 Dental exam: Completed 6 months ago. Due again today. Pap Smear: Hysterectomy Mammogram: Completed 1 year ago. Due in 2018.   Review of Systems  Constitutional: Negative for unexpected weight change.  HENT: Negative for rhinorrhea.   Respiratory: Negative for cough and shortness of breath.   Cardiovascular: Negative for chest pain.  Gastrointestinal: Negative for diarrhea and constipation.  Genitourinary: Negative for difficulty urinating and menstrual problem.  Musculoskeletal: Negative for myalgias.       Chronic knee pain. Healing chin splints from a 5K walk last weekend, overall feeling better.  Skin: Negative for rash.  Allergic/Immunologic: Negative for environmental allergies.  Neurological: Negative for dizziness, numbness and headaches.  Psychiatric/Behavioral:       Denies concerns for anxiety or depression       Past Medical History  Diagnosis Date  . Migraines   . Narcolepsy without cataplexy   . Restless leg   . Chronic knee pain   . Oral herpes   . Morbid obesity with BMI of 40.0-44.9, adult [facility])      Social History   Social History  . Marital Status: Married    Spouse Name: [name]  . Number of Children: 2  . Years of Education: College   Occupational History  .     Social History Main Topics  . Smoking status: Never Smoker   . Smokeless tobacco: Never Used  .  Alcohol Use: No  . Drug Use: No  . Sexual Activity: Not on file   Other Topics Concern  . Not on file   Social History Narrative   [name])   Patient has two adult children.   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   Patient has an college education.   Patient is right-handed.   Patient drinks very little caffeine.   Enjoys spending time with family.     Past Surgical History  Procedure Laterality Date  . Artroscopic meniscus repair    . Abdominal hysterectomy  2010    Family History  Problem Relation Age of Onset  . Breast cancer Mother   . Diabetes Mother   . Breast cancer Paternal Grandmother     Allergies  Allergen Reactions  . Nuvigil [Armodafinil] Hives  . Penicillins Rash    Current Outpatient Prescriptions on File Prior to Visit  Medication Sig Dispense Refill  . Cholecalciferol (VITAMIN D3) 2000 units TABS Take 2,000 Units by mouth daily.    . meloxicam (MOBIC) 15 MG tablet Take 15 mg by mouth as needed.   2  . Multiple Vitamins-Minerals (CENTRUM ADULTS PO) Take 1 tablet by mouth daily.    . pramipexole (MIRAPEX) 0.25 MG tablet Take 1 tablet by mouth 30 minutes to 1 hour prior to bedtime. 90 tablet 1  . rizatriptan (MAXALT) 10 MG tablet Take 1 tablet by mouth at migraine onset, may repeat in 2  hours if headache persists. Do not exceed 2 tablets in 24 hours. 10 tablet 5  . valACYclovir (VALTREX) 1000 MG tablet 1,000 mg as needed.     No current facility-administered medications on file prior to visit.    BP 132/88 mmHg  Pulse 70  Temp(Src) 97.7 F (36.5 C) (Oral)  Ht 5\' 3"  (1.6 m)  Wt 230 lb 1.9 oz (104.382 kg)  BMI 40.77 kg/m2  SpO2 97%    Objective:   Physical Exam  Constitutional: She is oriented to person, place, and time. She appears well-nourished.  HENT:  Right Ear: Tympanic membrane and ear canal normal.  Left Ear: Tympanic membrane and ear canal normal.  Nose: Nose normal.    Mouth/Throat: Oropharynx is clear and moist.  Eyes: Conjunctivae and EOM are normal. Pupils are equal, round, and reactive to light.  Neck: Neck supple. No thyromegaly present.  Cardiovascular: Normal rate and regular rhythm.   No murmur heard. Pulmonary/Chest: Effort normal and breath sounds normal. She has no rales.  Abdominal: Soft. Bowel sounds are normal. There is no tenderness.  Musculoskeletal: Normal range of motion.  Lymphadenopathy:    She has no cervical adenopathy.  Neurological: She is alert and oriented to person, place, and time. She has normal reflexes. No cranial nerve deficit.  Skin: Skin is warm and dry. No rash noted.  Psychiatric: She has a normal mood and affect.          Assessment & Plan:

## 2016-03-30 NOTE — Patient Instructions (Signed)
You will be contacted regarding your Mammogram.  Please let us know if you have not heard back within one week.   Work to reduce consumption of catered food during the week. Bring your own lunch most days to prevent excess calorie consumption.  You were provided with a tetanus vaccination which will cover you for 10 years.   Follow up in 1 year for repeat physical or sooner if needed.  It was a pleasure to see you today!

## 2016-03-30 NOTE — Addendum Note (Signed)
Addended by: Jacqualin Combes on: 03/30/2016 08:16 AM   Modules accepted: Orders, SmartSet

## 2016-03-30 NOTE — Assessment & Plan Note (Signed)
Td due, provided today.  Mammogram ordered. Colonoscopy due next year. Poor diet and does not exercise. Discussed the importance of a healthy diet and regular exercise in order for weight loss and to reduce risk of other medical diseases. Exam unremarkable. Labs stable.  Follow up in 1 year for repeat physical.

## 2016-04-26 DIAGNOSIS — M7751 Other enthesopathy of right foot: Secondary | ICD-10-CM | POA: Diagnosis not present

## 2016-05-10 DIAGNOSIS — L503 Dermatographic urticaria: Secondary | ICD-10-CM | POA: Diagnosis not present

## 2016-05-10 DIAGNOSIS — R21 Rash and other nonspecific skin eruption: Secondary | ICD-10-CM | POA: Diagnosis not present

## 2016-05-17 ENCOUNTER — Other Ambulatory Visit: Payer: Self-pay | Admitting: Bariatrics

## 2016-05-17 DIAGNOSIS — K219 Gastro-esophageal reflux disease without esophagitis: Secondary | ICD-10-CM | POA: Diagnosis not present

## 2016-05-17 DIAGNOSIS — G4733 Obstructive sleep apnea (adult) (pediatric): Secondary | ICD-10-CM | POA: Insufficient documentation

## 2016-05-17 DIAGNOSIS — R5383 Other fatigue: Secondary | ICD-10-CM | POA: Diagnosis not present

## 2016-05-17 DIAGNOSIS — R5381 Other malaise: Secondary | ICD-10-CM | POA: Diagnosis not present

## 2016-05-26 ENCOUNTER — Ambulatory Visit: Payer: 59

## 2016-06-12 DIAGNOSIS — M170 Bilateral primary osteoarthritis of knee: Secondary | ICD-10-CM | POA: Diagnosis not present

## 2016-06-12 DIAGNOSIS — M25562 Pain in left knee: Secondary | ICD-10-CM | POA: Diagnosis not present

## 2016-06-12 DIAGNOSIS — M25561 Pain in right knee: Secondary | ICD-10-CM | POA: Diagnosis not present

## 2016-06-12 DIAGNOSIS — R262 Difficulty in walking, not elsewhere classified: Secondary | ICD-10-CM | POA: Diagnosis not present

## 2016-06-13 ENCOUNTER — Encounter: Payer: Self-pay | Admitting: Primary Care

## 2016-06-14 ENCOUNTER — Encounter: Payer: Self-pay | Admitting: Primary Care

## 2016-06-14 ENCOUNTER — Ambulatory Visit (INDEPENDENT_AMBULATORY_CARE_PROVIDER_SITE_OTHER): Payer: 59 | Admitting: Primary Care

## 2016-06-14 VITALS — BP 122/74 | HR 74 | Temp 98.0°F | Ht 63.0 in | Wt 233.1 lb

## 2016-06-14 DIAGNOSIS — R109 Unspecified abdominal pain: Secondary | ICD-10-CM

## 2016-06-14 LAB — POC URINALSYSI DIPSTICK (AUTOMATED)
BILIRUBIN UA: NEGATIVE
Blood, UA: NEGATIVE
GLUCOSE UA: NEGATIVE
KETONES UA: NEGATIVE
LEUKOCYTES UA: NEGATIVE
NITRITE UA: NEGATIVE
Protein, UA: NEGATIVE
Spec Grav, UA: 1.025
Urobilinogen, UA: NEGATIVE
pH, UA: 6

## 2016-06-14 MED ORDER — CYCLOBENZAPRINE HCL 5 MG PO TABS: 5.0000 mg | ORAL_TABLET | Freq: Three times a day (TID) | ORAL | 0 refills | Status: DC | PRN

## 2016-06-14 NOTE — Progress Notes (Signed)
Subjective:    Patient ID: Amy Mooney, female    DOB: 07-Sep-1966, 50 y.o.   MRN: [id number]  HPI  [name] is a 50 yo female who presents with flank pain. Her pain is located to the right flank and has been present for the past 1 week. Her pain is intermittent and she describes it as stabbing and sharp, especially with certain movements such as twisting and turning. She taken ibuprofen OTC without relief. She denies n/v, fevers, abd/pelvic pain, urinary symptoms, changes in her diet. She has a history of kidney stones with her last episode being 6 years ago.  Review of Systems  Constitutional: Negative for chills and fever.  Gastrointestinal: Negative for abdominal pain and nausea.  Genitourinary: Positive for flank pain. Negative for dysuria, frequency, hematuria, pelvic pain and vaginal discharge.       Past Medical History:  Diagnosis Date  . Chronic knee pain   . Migraines   . Morbid obesity with BMI of 40.0-44.9, adult ([place])   . Narcolepsy without cataplexy   . Oral herpes   . Restless leg      Social History   Social History  . Marital status: Married    Spouse name: Amy Mooney  . Number of children: 2  . Years of education: College   Occupational History  .  [employer]   Social History Main Topics  . Smoking status: Never Smoker  . Smokeless tobacco: Never Used  . Alcohol use No  . Drug use: No  . Sexual activity: Not on file   Other Topics Concern  . Not on file   Social History Narrative   Patient lives at home with her husband Amy Mooney)   Patient has two adult children.   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   Patient has an college education.   Patient is right-handed.   Patient drinks very little caffeine.   Enjoys spending time with family.     Past Surgical History:  Procedure Laterality Date  . ABDOMINAL HYSTERECTOMY  2010  . artroscopic meniscus repair      Family History  Problem Relation Age of Onset  . Breast  cancer Mother   . Diabetes Mother   . Breast cancer Paternal Grandmother     Allergies  Allergen Reactions  . Nuvigil [Armodafinil] Hives  . Penicillins Rash    Current Outpatient Prescriptions on File Prior to Visit  Medication Sig Dispense Refill  . Cholecalciferol (VITAMIN D3) 2000 units TABS Take 2,000 Units by mouth daily.    . Multiple Vitamins-Minerals (CENTRUM ADULTS PO) Take 1 tablet by mouth daily.    . pramipexole (MIRAPEX) 0.25 MG tablet Take 1 tablet by mouth 30 minutes to 1 hour prior to bedtime. 90 tablet 1  . rizatriptan (MAXALT) 10 MG tablet Take 1 tablet by mouth at migraine onset, may repeat in 2 hours if headache persists. Do not exceed 2 tablets in 24 hours. 10 tablet 5  . valACYclovir (VALTREX) 1000 MG tablet 1,000 mg as needed.    . meloxicam (MOBIC) 15 MG tablet Take 15 mg by mouth as needed.   2   No current facility-administered medications on file prior to visit.     BP 122/74   Pulse 74   Temp 98 F (36.7 C) (Oral)   Ht 5\' 3"  (1.6 m)   Wt 233 lb 1.9 oz (105.7 kg)   SpO2 98%   BMI 41.30 kg/m    Objective:  Physical Exam  Constitutional: She appears well-nourished. She does not appear ill.  Neck: Neck supple.  Cardiovascular: Normal rate and regular rhythm.   Pulmonary/Chest: Effort normal and breath sounds normal.  Abdominal: Soft. Bowel sounds are normal. There is no tenderness. There is no CVA tenderness.  Musculoskeletal:  Right flank pain with twisting movement. Also pain with moving to and from exam table.  Skin: Skin is warm and dry.          Assessment & Plan:  Flank pain:  Located to right flank for the past 1 week. History of kidney stones 6 years ago. Exam today suspicious of MSK involvement such as muscle spasm/strain. UA: Negative. No CVA tenderness. Does not appear acutely ill or distressed. Suspect MSK involvement at this point and will treat with conservative measures. Rx for Flexeril sent to pharmacy to use as  needed for discomfort. Discussed to increase water consumption and limit sugary drinks for now. Continue ibuprofen as needed. She will call if no improvement in the next 3-4 days.  Sheral Flow, NP

## 2016-06-14 NOTE — Patient Instructions (Signed)
You may take Flexeril (cyclobenzaprine) tablets as needed for muscle spasm. Take 1 tablet by mouth every 8 hours as needed. Caution as this may cause drowsiness.  Continue Ibuprofen. Take 600 mg three times daily as needed for pain.  Complete stretching exercises as discussed. Apply a heating pad three times daily for 20 minute intervals.  Please e-mail me if no improvement in 3-4 days.  It was a pleasure to see you today!

## 2016-06-14 NOTE — Progress Notes (Signed)
Pre visit review using our clinic review tool, if applicable. No additional management support is needed unless otherwise documented below in the visit note. 

## 2016-06-21 DIAGNOSIS — M25561 Pain in right knee: Secondary | ICD-10-CM | POA: Diagnosis not present

## 2016-06-21 DIAGNOSIS — M25562 Pain in left knee: Secondary | ICD-10-CM | POA: Diagnosis not present

## 2016-06-21 DIAGNOSIS — M170 Bilateral primary osteoarthritis of knee: Secondary | ICD-10-CM | POA: Diagnosis not present

## 2016-06-23 ENCOUNTER — Ambulatory Visit: Payer: 59

## 2016-06-28 ENCOUNTER — Encounter: Payer: Self-pay | Admitting: Primary Care

## 2016-06-28 DIAGNOSIS — M25561 Pain in right knee: Secondary | ICD-10-CM | POA: Diagnosis not present

## 2016-06-28 DIAGNOSIS — M25562 Pain in left knee: Secondary | ICD-10-CM | POA: Diagnosis not present

## 2016-06-28 DIAGNOSIS — M170 Bilateral primary osteoarthritis of knee: Secondary | ICD-10-CM | POA: Diagnosis not present

## 2016-06-30 ENCOUNTER — Ambulatory Visit: Payer: 59 | Admitting: Dietician

## 2016-07-05 DIAGNOSIS — M25562 Pain in left knee: Secondary | ICD-10-CM | POA: Diagnosis not present

## 2016-07-05 DIAGNOSIS — M170 Bilateral primary osteoarthritis of knee: Secondary | ICD-10-CM | POA: Diagnosis not present

## 2016-07-05 DIAGNOSIS — M25561 Pain in right knee: Secondary | ICD-10-CM | POA: Diagnosis not present

## 2016-07-07 ENCOUNTER — Ambulatory Visit: Payer: 59

## 2016-07-12 DIAGNOSIS — M170 Bilateral primary osteoarthritis of knee: Secondary | ICD-10-CM | POA: Diagnosis not present

## 2016-07-12 DIAGNOSIS — M25561 Pain in right knee: Secondary | ICD-10-CM | POA: Diagnosis not present

## 2016-07-12 DIAGNOSIS — M25562 Pain in left knee: Secondary | ICD-10-CM | POA: Diagnosis not present

## 2016-08-25 ENCOUNTER — Ambulatory Visit: Payer: 59

## 2016-09-06 ENCOUNTER — Ambulatory Visit (INDEPENDENT_AMBULATORY_CARE_PROVIDER_SITE_OTHER): Payer: 59

## 2016-09-06 ENCOUNTER — Ambulatory Visit (INDEPENDENT_AMBULATORY_CARE_PROVIDER_SITE_OTHER): Payer: 59 | Admitting: Orthopedic Surgery

## 2016-09-06 VITALS — BP 157/90 | HR 81 | Wt 241.0 lb

## 2016-09-06 DIAGNOSIS — M25561 Pain in right knee: Secondary | ICD-10-CM

## 2016-09-06 DIAGNOSIS — G8929 Other chronic pain: Secondary | ICD-10-CM | POA: Diagnosis not present

## 2016-09-06 DIAGNOSIS — M170 Bilateral primary osteoarthritis of knee: Secondary | ICD-10-CM

## 2016-09-06 DIAGNOSIS — M25562 Pain in left knee: Secondary | ICD-10-CM

## 2016-09-06 DIAGNOSIS — M1711 Unilateral primary osteoarthritis, right knee: Secondary | ICD-10-CM | POA: Diagnosis not present

## 2016-09-06 MED ORDER — DICLOFENAC POTASSIUM 50 MG PO TABS: 50.0000 mg | ORAL_TABLET | Freq: Two times a day (BID) | ORAL | 2 refills | Status: DC

## 2016-09-06 NOTE — Patient Instructions (Signed)
Weight loss  nsaids   Reck 6 weeks

## 2016-09-06 NOTE — Progress Notes (Signed)
Patient ID: Amy Mooney, female   DOB: 03-05-1966, 50 y.o.   MRN: [id number]  Chief Complaint  Patient presents with  . Knee Pain    bilateral knee pain    HPI Amy Mooney is a 49 y.o. female.  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 She describes intermittent pain stiffness and increased pain with standing. She has tried ibuprofen (which she also takes to control migraine headaches) and meloxicam which did not seem to help.   Review of Systems Review of Systems  Constitutional: Negative.   Respiratory: Negative.   Cardiovascular: Negative.   Gastrointestinal: Negative.     Past Medical History:  Diagnosis Date  . Chronic knee pain   . Migraines   . Morbid obesity with BMI of 40.0-44.9, adult ([place])   . Narcolepsy without cataplexy   . Oral herpes   . Restless leg     Past Surgical History:  Procedure Laterality Date  . ABDOMINAL HYSTERECTOMY  2010  . artroscopic meniscus repair      Social History Social History  Substance Use Topics  . Smoking status: Never Smoker  . Smokeless tobacco: Never Used  . Alcohol use No    Allergies  Allergen Reactions  . Nuvigil [Armodafinil] Hives  . Penicillins Rash    No outpatient prescriptions have been marked as taking for the 09/06/16 encounter (Appointment) with Carole Civil, MD.      Physical Exam Physical Exam There were no vitals taken for this visit.  Gen. appearance. The patient is well-developed and well-nourished, grooming and hygiene are normal. There are no gross congenital abnormalities  The patient is alert and oriented to person place and time  Mood and affect are normal  Ambulation remains normal   Examination reveals the following: On inspection we find medial joint line tenderness in each knee  With the range of motion of  The right knee 110 and the left 115  Stability tests were normal  In each knee   Strength tests revealed grade 5 motor  strength in each quadriceps   Skin we find no rash ulceration or erythema on the right or left leg   Sensation remains intact in each leg   Impression vascular system shows no peripheral edema in the right or left ankle   Data Reviewed I read bilateral knee films as severe OA in each knee   Assessment    Encounter Diagnoses  Name Primary?  . Primary osteoarthritis of both knees Yes  . Primary osteoarthritis of right knee        Plan    Start aggressive medical management in this 50 yo female who is on her feet all day and starting to have trouble with routine activity   Meds ordered this encounter  Medications  . diclofenac (CATAFLAM) 50 MG tablet    Sig: Take 1 tablet (50 mg total) by mouth 2 (two) times daily.    Dispense:  60 tablet    Refill:  2   I recommended she lose weight to get her MI to under 40  And I discussed the long term need for TKA   Return in 6 weeks        Arther Abbott 09/06/2016, 9:14 AM

## 2016-09-29 ENCOUNTER — Ambulatory Visit
Admission: RE | Admit: 2016-09-29 | Discharge: 2016-09-29 | Disposition: A | Discharge status: Home / Self Care | Payer: 59 | Source: Ambulatory Visit | Attending: Primary Care | Admitting: Primary Care

## 2016-09-29 DIAGNOSIS — Z1231 Encounter for screening mammogram for malignant neoplasm of breast: Secondary | ICD-10-CM | POA: Diagnosis not present

## 2016-09-29 DIAGNOSIS — Z1239 Encounter for other screening for malignant neoplasm of breast: Secondary | ICD-10-CM

## 2016-10-18 ENCOUNTER — Encounter (INDEPENDENT_AMBULATORY_CARE_PROVIDER_SITE_OTHER): Payer: No Typology Code available for payment source | Admitting: Family Medicine

## 2016-10-24 ENCOUNTER — Ambulatory Visit: Payer: 59 | Admitting: Orthopedic Surgery

## 2016-10-25 ENCOUNTER — Encounter: Payer: Self-pay | Admitting: Orthopedic Surgery

## 2016-10-25 ENCOUNTER — Ambulatory Visit (INDEPENDENT_AMBULATORY_CARE_PROVIDER_SITE_OTHER): Payer: 59 | Admitting: Orthopedic Surgery

## 2016-10-25 VITALS — Wt 244.0 lb

## 2016-10-25 DIAGNOSIS — D649 Anemia, unspecified: Secondary | ICD-10-CM | POA: Diagnosis not present

## 2016-10-25 MED ORDER — DICLOFENAC POTASSIUM 50 MG PO TABS: 50.0000 mg | ORAL_TABLET | Freq: Two times a day (BID) | ORAL | 2 refills | Status: DC

## 2016-10-25 NOTE — Progress Notes (Signed)
Chief Complaint  Patient presents with  . Knee Pain      bilateral knee pain      HPI Amy Mooney is a 51 y.o. female.  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 She describes intermittent pain stiffness and increased pain with standing. She has tried ibuprofen (which she also takes to control migraine headaches) and meloxicam which did not seem to help.    Review of Systems As noted in 09/06/2016 Review of Systems  Constitutional: Negative.   Respiratory: Negative.   Cardiovascular: Negative.   Gastrointestinal: Negative.    Scheduled follow-up on, started diclofenac which is pretty much controlling her knee pain.  She still has some stiffness when sitting for long periods of time  She does continue to take ibuprofen to prevent migraine when needed  She is going to continue with the diclofenac. We will get a CBC since she does intermittently take ibuprofen to check her hemoglobin for anemia  She'll follow-up in 3 months and we will refill the diclofenac

## 2016-11-11 ENCOUNTER — Encounter (INDEPENDENT_AMBULATORY_CARE_PROVIDER_SITE_OTHER): Payer: No Typology Code available for payment source | Admitting: Family Medicine

## 2016-11-17 DIAGNOSIS — D649 Anemia, unspecified: Secondary | ICD-10-CM | POA: Diagnosis not present

## 2016-11-17 DIAGNOSIS — H52222 Regular astigmatism, left eye: Secondary | ICD-10-CM | POA: Diagnosis not present

## 2016-11-17 DIAGNOSIS — H5212 Myopia, left eye: Secondary | ICD-10-CM | POA: Diagnosis not present

## 2016-11-17 DIAGNOSIS — H524 Presbyopia: Secondary | ICD-10-CM | POA: Diagnosis not present

## 2016-11-17 DIAGNOSIS — H5211 Myopia, right eye: Secondary | ICD-10-CM | POA: Diagnosis not present

## 2016-11-18 LAB — CBC WITH DIFFERENTIAL/PLATELET
BASOS: 0 %
Basophils Absolute: 0 10*3/uL (ref 0.0–0.2)
EOS (ABSOLUTE): 0.2 10*3/uL (ref 0.0–0.4)
EOS: 2 %
HEMATOCRIT: 39.1 % (ref 34.0–46.6)
Hemoglobin: 13 g/dL (ref 11.1–15.9)
Immature Grans (Abs): 0 10*3/uL (ref 0.0–0.1)
Immature Granulocytes: 0 %
LYMPHS ABS: 3.7 10*3/uL — AB (ref 0.7–3.1)
Lymphs: 40 %
MCH: 26.9 pg (ref 26.6–33.0)
MCHC: 33.2 g/dL (ref 31.5–35.7)
MCV: 81 fL (ref 79–97)
MONOS ABS: 0.6 10*3/uL (ref 0.1–0.9)
Monocytes: 7 %
Neutrophils Absolute: 4.8 10*3/uL (ref 1.4–7.0)
Neutrophils: 51 %
Platelets: 225 10*3/uL (ref 150–379)
RBC: 4.84 x10E6/uL (ref 3.77–5.28)
RDW: 15.3 % (ref 12.3–15.4)
WBC: 9.3 10*3/uL (ref 3.4–10.8)

## 2016-12-04 ENCOUNTER — Other Ambulatory Visit: Payer: Self-pay | Admitting: Primary Care

## 2016-12-04 DIAGNOSIS — G2581 Restless legs syndrome: Secondary | ICD-10-CM

## 2016-12-04 NOTE — Telephone Encounter (Signed)
Ok to refill? Electronically refill request for pramipexole (MIRAPEX) 0.25 MG tablet. Last prescribed on 03/17/2016. Last seen on 03/30/2016

## 2016-12-11 DIAGNOSIS — D2261 Melanocytic nevi of right upper limb, including shoulder: Secondary | ICD-10-CM | POA: Diagnosis not present

## 2016-12-11 DIAGNOSIS — D485 Neoplasm of uncertain behavior of skin: Secondary | ICD-10-CM | POA: Diagnosis not present

## 2016-12-21 ENCOUNTER — Other Ambulatory Visit: Payer: Self-pay | Admitting: Primary Care

## 2016-12-21 ENCOUNTER — Encounter: Payer: Self-pay | Admitting: Primary Care

## 2016-12-21 DIAGNOSIS — Z20828 Contact with and (suspected) exposure to other viral communicable diseases: Secondary | ICD-10-CM

## 2016-12-21 MED ORDER — OSELTAMIVIR PHOSPHATE 75 MG PO CAPS: 75.0000 mg | ORAL_CAPSULE | Freq: Every day | ORAL | 0 refills | Status: DC

## 2017-01-15 ENCOUNTER — Telehealth: Payer: 59 | Admitting: Adult Health

## 2017-01-15 DIAGNOSIS — N3000 Acute cystitis without hematuria: Secondary | ICD-10-CM

## 2017-01-15 MED ORDER — CEPHALEXIN 500 MG PO CAPS: 500.0000 mg | ORAL_CAPSULE | Freq: Two times a day (BID) | ORAL | 0 refills | Status: AC

## 2017-01-15 NOTE — Progress Notes (Signed)

## 2017-01-31 ENCOUNTER — Ambulatory Visit: Payer: 59 | Admitting: Orthopedic Surgery

## 2017-02-09 ENCOUNTER — Encounter: Payer: Self-pay | Admitting: Orthopedic Surgery

## 2017-02-09 ENCOUNTER — Ambulatory Visit (INDEPENDENT_AMBULATORY_CARE_PROVIDER_SITE_OTHER): Payer: 59 | Admitting: Orthopedic Surgery

## 2017-02-09 DIAGNOSIS — M170 Bilateral primary osteoarthritis of knee: Secondary | ICD-10-CM

## 2017-02-09 MED ORDER — DICLOFENAC POTASSIUM 50 MG PO TABS: 50.0000 mg | ORAL_TABLET | Freq: Two times a day (BID) | ORAL | 2 refills | Status: DC

## 2017-02-09 NOTE — Progress Notes (Signed)
Progress Note   Patient ID: Amy Mooney, female   DOB: Apr 21, 1966, 51 y.o.   MRN: [id number]  Chief Complaint  Patient presents with  . Follow-up    bilateral knees oa    HPI Amy Mooney is a 51 y.o. female.   HPI 51 year old female with bilateral knee osteoarthritis. BMI is over 40. She's had flexor Genex ibuprofen currently on diclofenac twice a day she is ambulatory with no assistive devices.  She had some increased pain on her vacation with more pain tonight and during the day her left knee hurts worse than the right  She's here for routine follow-up  Review of Systems Review of Systems Normal neuro  Denies fever  Examination There were no vitals taken for this visit.  Gen. appearance the patient's appearance is normal with normal grooming and  hygiene The patient is oriented to person place and time Mood and affect are normal   Ortho Exam Stability tests are normal in each knee Motor exam 5/5 manual muscle testing , no atrophy right and left leg Skin is normal (no rash or erythema) right and left leg Range of motion remains the same in each eye in left knee with crepitus especially in the patellofemoral joint and medial and patellofemoral joint tenderness bilaterally  Medical decision-making Diagnosis, Data, Plan (risk)  Encounter Diagnosis  Name Primary?  . Primary osteoarthritis of both knees Yes   We recommend that she continue diclofenac 50 mg twice a day come back in 6 months for routine follow-up x-rays are not necessary unless change in condition   Current Outpatient Prescriptions:  .  diclofenac (CATAFLAM) 50 MG tablet, Take 1 tablet (50 mg total) by mouth 2 (two) times daily., Disp: 60 tablet, Rfl: 2 .  pramipexole (MIRAPEX) 0.25 MG tablet, TAKE 1 TABLET BY MOUTH 30 MINUTES TO 1 HOUR PRIOR TO BEDTIME., Disp: 90 tablet, Rfl: 1 .  rizatriptan (MAXALT) 10 MG tablet, Take 1 tablet by mouth at migraine onset, may repeat in 2 hours if headache persists. Do  not exceed 2 tablets in 24 hours., Disp: 10 tablet, Rfl: 5   Arther Abbott, MD 02/09/2017 8:58 AM

## 2017-03-15 ENCOUNTER — Telehealth: Payer: Self-pay | Admitting: Orthopedic Surgery

## 2017-03-15 NOTE — Telephone Encounter (Signed)
Forwarding for scheduling.

## 2017-03-15 NOTE — Telephone Encounter (Signed)
Patient called, states Dr Aline Brochure is treating her for arthritis of both knees; relays that her "right medial knee" has started hurting and that the pain is sharp at times, particularly when she moves a certain way.  Last office visit was 02/09/17.  Please advise if new appointment is needed or if other recommendation. [phone]

## 2017-03-15 NOTE — Telephone Encounter (Signed)
"  joint space narrowing on the medial side is more evident on this x-ray view"  Patient is on diclofenac, follow up with xrays due October, any further recommendations?

## 2017-03-15 NOTE — Telephone Encounter (Signed)
Schedule for re evaluation next week

## 2017-03-16 NOTE — Telephone Encounter (Signed)
Reached patient; scheduled appointment accordingly. Patient aware.

## 2017-03-20 ENCOUNTER — Encounter: Payer: Self-pay | Admitting: Orthopedic Surgery

## 2017-03-20 ENCOUNTER — Ambulatory Visit (INDEPENDENT_AMBULATORY_CARE_PROVIDER_SITE_OTHER): Payer: 59 | Admitting: Orthopedic Surgery

## 2017-03-20 VITALS — BP 154/87 | HR 73 | Ht 62.5 in | Wt 246.0 lb

## 2017-03-20 DIAGNOSIS — M25561 Pain in right knee: Secondary | ICD-10-CM

## 2017-03-20 NOTE — Progress Notes (Signed)
Progress Note   Patient ID: Amy Mooney, female   DOB: [birth year], 51 y.o.   MRN: [id number]  Chief Complaint  Patient presents with  . Follow-up    right medial knee pain    HPI Philena B Altmann is a 51 y.o. female.   51 year old female started having new kind of pain in her arthritic right knee. She knows what her right knee pain is like and it was different. It was a sharp pain he comes intermittently is not related to anything in specifics  She had a tear in her meniscus before and it doesn't feel like that either       Review of Systems Review of Systems Normal neuro  Denies fever  Examination BP (!) 154/87   Pulse 73   Ht 5' 2.5" (1.588 m)   Wt 246 lb (111.6 kg)   BMI 44.28 kg/m   Gen. appearance the patient's appearance is normal with normal grooming and  hygiene The patient is oriented to person place and time Mood and affect are normal   Ortho Exam Stability tests are normal  Motor exam 5/5 manual muscle testing , no atrophy  Skin is normal (no rash or erythema)  Right knee is tender over the medial femoral condyle but not the pain she was experiencing in that she has bursitis pain with tenderness over the pes bursa some swelling is maintained her range of motion  Medical decision-making Diagnosis, Data, Plan (risk)  Differential diagnoses includes arthritis of the knee, meniscal tear, bursitis, avascular necrosis  Patient is advised to call us for an appointment if she still experienced any catching locking giving way or mechanical symptoms or pain similar to her previous meniscal tear  Arther Abbott, MD 03/20/2017 3:23 PM

## 2017-03-22 ENCOUNTER — Emergency Department: Payer: 59 | Admitting: Certified Registered Nurse Anesthetist

## 2017-03-22 ENCOUNTER — Emergency Department: Payer: 59

## 2017-03-22 ENCOUNTER — Encounter: Payer: Self-pay | Admitting: Primary Care

## 2017-03-22 ENCOUNTER — Encounter
Admission: EM | Disposition: A | Discharge status: Home / Self Care | Payer: Self-pay | Source: Home / Self Care | Attending: Emergency Medicine

## 2017-03-22 ENCOUNTER — Observation Stay
Admission: EM | Admit: 2017-03-22 | Discharge: 2017-03-23 | Disposition: A | Discharge status: Home / Self Care | Payer: 59 | Attending: Obstetrics & Gynecology | Admitting: Obstetrics & Gynecology

## 2017-03-22 ENCOUNTER — Other Ambulatory Visit: Payer: Self-pay | Admitting: Primary Care

## 2017-03-22 ENCOUNTER — Encounter: Payer: Self-pay | Admitting: Emergency Medicine

## 2017-03-22 DIAGNOSIS — R102 Pelvic and perineal pain: Secondary | ICD-10-CM | POA: Diagnosis not present

## 2017-03-22 DIAGNOSIS — N83291 Other ovarian cyst, right side: Secondary | ICD-10-CM | POA: Diagnosis not present

## 2017-03-22 DIAGNOSIS — N83511 Torsion of right ovary and ovarian pedicle: Principal | ICD-10-CM | POA: Insufficient documentation

## 2017-03-22 DIAGNOSIS — N8311 Corpus luteum cyst of right ovary: Secondary | ICD-10-CM | POA: Diagnosis not present

## 2017-03-22 DIAGNOSIS — Z880 Allergy status to penicillin: Secondary | ICD-10-CM | POA: Insufficient documentation

## 2017-03-22 DIAGNOSIS — K5732 Diverticulitis of large intestine without perforation or abscess without bleeding: Secondary | ICD-10-CM | POA: Diagnosis not present

## 2017-03-22 DIAGNOSIS — G43009 Migraine without aura, not intractable, without status migrainosus: Secondary | ICD-10-CM | POA: Diagnosis not present

## 2017-03-22 DIAGNOSIS — Z0000 Encounter for general adult medical examination without abnormal findings: Secondary | ICD-10-CM

## 2017-03-22 DIAGNOSIS — Z6841 Body Mass Index (BMI) 40.0 and over, adult: Secondary | ICD-10-CM | POA: Insufficient documentation

## 2017-03-22 DIAGNOSIS — Z79899 Other long term (current) drug therapy: Secondary | ICD-10-CM | POA: Diagnosis not present

## 2017-03-22 DIAGNOSIS — N83519 Torsion of ovary and ovarian pedicle, unspecified side: Secondary | ICD-10-CM

## 2017-03-22 DIAGNOSIS — R7303 Prediabetes: Secondary | ICD-10-CM

## 2017-03-22 DIAGNOSIS — Z9889 Other specified postprocedural states: Secondary | ICD-10-CM

## 2017-03-22 DIAGNOSIS — N8301 Follicular cyst of right ovary: Secondary | ICD-10-CM | POA: Diagnosis not present

## 2017-03-22 DIAGNOSIS — G8929 Other chronic pain: Secondary | ICD-10-CM | POA: Insufficient documentation

## 2017-03-22 DIAGNOSIS — G2581 Restless legs syndrome: Secondary | ICD-10-CM | POA: Insufficient documentation

## 2017-03-22 DIAGNOSIS — N83201 Unspecified ovarian cyst, right side: Secondary | ICD-10-CM

## 2017-03-22 DIAGNOSIS — N8353 Torsion of ovary, ovarian pedicle and fallopian tube: Secondary | ICD-10-CM | POA: Diagnosis not present

## 2017-03-22 DIAGNOSIS — R1031 Right lower quadrant pain: Secondary | ICD-10-CM | POA: Diagnosis not present

## 2017-03-22 HISTORY — PX: LAPAROSCOPIC SALPINGO OOPHERECTOMY: SHX5927

## 2017-03-22 HISTORY — DX: Torsion of ovary and ovarian pedicle, unspecified side: N83.519

## 2017-03-22 LAB — URINALYSIS, COMPLETE (UACMP) WITH MICROSCOPIC
BILIRUBIN URINE: NEGATIVE
Bacteria, UA: NONE SEEN
GLUCOSE, UA: NEGATIVE mg/dL
HGB URINE DIPSTICK: NEGATIVE
Ketones, ur: NEGATIVE mg/dL
Leukocytes, UA: NEGATIVE
NITRITE: NEGATIVE
Protein, ur: NEGATIVE mg/dL
SPECIFIC GRAVITY, URINE: 1.013 (ref 1.005–1.030)
pH: 6 (ref 5.0–8.0)

## 2017-03-22 LAB — COMPREHENSIVE METABOLIC PANEL
ALK PHOS: 82 U/L (ref 38–126)
ALT: 35 U/L (ref 14–54)
ANION GAP: 8 (ref 5–15)
AST: 33 U/L (ref 15–41)
Albumin: 4.4 g/dL (ref 3.5–5.0)
BILIRUBIN TOTAL: 1 mg/dL (ref 0.3–1.2)
BUN: 15 mg/dL (ref 6–20)
CALCIUM: 9 mg/dL (ref 8.9–10.3)
CO2: 26 mmol/L (ref 22–32)
CREATININE: 0.89 mg/dL (ref 0.44–1.00)
Chloride: 99 mmol/L — ABNORMAL LOW (ref 101–111)
GFR calc non Af Amer: >60 mL/min (ref >60)
Glucose, Bld: 98 mg/dL (ref 65–99)
Potassium: 3.4 mmol/L — ABNORMAL LOW (ref 3.5–5.1)
SODIUM: 133 mmol/L — AB (ref 135–145)
TOTAL PROTEIN: 8.1 g/dL (ref 6.5–8.1)

## 2017-03-22 LAB — CBC
HCT: 39 % (ref 35.0–47.0)
Hemoglobin: 13.1 g/dL (ref 12.0–16.0)
MCH: 27.4 pg (ref 26.0–34.0)
MCHC: 33.4 g/dL (ref 32.0–36.0)
MCV: 82.1 fL (ref 80.0–100.0)
PLATELETS: 197 10*3/uL (ref 150–440)
RBC: 4.76 MIL/uL (ref 3.80–5.20)
RDW: 15 % — ABNORMAL HIGH (ref 11.5–14.5)
WBC: 9.9 10*3/uL (ref 3.6–11.0)

## 2017-03-22 LAB — LIPASE, BLOOD: Lipase: 29 U/L (ref 11–51)

## 2017-03-22 SURGERY — SALPINGO-OOPHORECTOMY, LAPAROSCOPIC
Anesthesia: General | Laterality: Right | Wound class: Clean Contaminated

## 2017-03-22 MED ORDER — ONDANSETRON HCL 4 MG/2ML IJ SOLN
INTRAMUSCULAR | Status: AC
  Filled 2017-03-22: qty 2

## 2017-03-22 MED ORDER — BUPIVACAINE HCL (PF) 0.5 % IJ SOLN
INTRAMUSCULAR | Status: DC | PRN
  Administered 2017-03-22: 10 mL

## 2017-03-22 MED ORDER — DEXAMETHASONE SODIUM PHOSPHATE 10 MG/ML IJ SOLN
INTRAMUSCULAR | Status: DC | PRN
  Administered 2017-03-22: 10 mg via INTRAVENOUS

## 2017-03-22 MED ORDER — MIDAZOLAM HCL 2 MG/2ML IJ SOLN
INTRAMUSCULAR | Status: DC | PRN
  Administered 2017-03-22: 2 mg via INTRAVENOUS

## 2017-03-22 MED ORDER — OXYCODONE HCL 5 MG PO TABS
ORAL_TABLET | ORAL | Status: AC
  Filled 2017-03-22: qty 2

## 2017-03-22 MED ORDER — ACETAMINOPHEN 650 MG RE SUPP
650.0000 mg | RECTAL | Status: DC | PRN
  Filled 2017-03-22: qty 1

## 2017-03-22 MED ORDER — SODIUM CHLORIDE 0.9 % IV SOLN
INTRAVENOUS | Status: DC | PRN
  Administered 2017-03-22: 20:00:00 via INTRAVENOUS

## 2017-03-22 MED ORDER — BUPIVACAINE LIPOSOME 1.3 % IJ SUSP
INTRAMUSCULAR | Status: AC
  Filled 2017-03-22: qty 20

## 2017-03-22 MED ORDER — ONDANSETRON HCL 4 MG/2ML IJ SOLN: 4.0000 mg | Freq: Once | INTRAMUSCULAR | Status: DC | PRN

## 2017-03-22 MED ORDER — TRAMADOL HCL 50 MG PO TABS
50.0000 mg | ORAL_TABLET | Freq: Four times a day (QID) | ORAL | Status: DC | PRN
  Administered 2017-03-23: 50 mg via ORAL
  Filled 2017-03-22: qty 1

## 2017-03-22 MED ORDER — FENTANYL CITRATE (PF) 100 MCG/2ML IJ SOLN
INTRAMUSCULAR | Status: AC
  Filled 2017-03-22: qty 2

## 2017-03-22 MED ORDER — KETOROLAC TROMETHAMINE 30 MG/ML IJ SOLN
INTRAMUSCULAR | Status: AC
  Filled 2017-03-22: qty 1

## 2017-03-22 MED ORDER — PROPOFOL 10 MG/ML IV BOLUS
INTRAVENOUS | Status: AC
  Filled 2017-03-22: qty 20

## 2017-03-22 MED ORDER — SODIUM CHLORIDE 0.9 % IR SOLN
Status: DC | PRN
  Administered 2017-03-22: 500 mL

## 2017-03-22 MED ORDER — FENTANYL CITRATE (PF) 100 MCG/2ML IJ SOLN
INTRAMUSCULAR | Status: DC | PRN
  Administered 2017-03-22 (×4): 50 ug via INTRAVENOUS

## 2017-03-22 MED ORDER — OXYCODONE HCL 5 MG PO TABS: 5.0000 mg | ORAL_TABLET | ORAL | 0 refills | Status: DC | PRN

## 2017-03-22 MED ORDER — KETOROLAC TROMETHAMINE 30 MG/ML IJ SOLN: 30.0000 mg | Freq: Once | INTRAMUSCULAR | Status: DC

## 2017-03-22 MED ORDER — KETOROLAC TROMETHAMINE 30 MG/ML IJ SOLN
30.0000 mg | Freq: Four times a day (QID) | INTRAMUSCULAR | Status: DC
  Administered 2017-03-22: 30 mg via INTRAVENOUS

## 2017-03-22 MED ORDER — MIDAZOLAM HCL 2 MG/2ML IJ SOLN
INTRAMUSCULAR | Status: AC
  Filled 2017-03-22: qty 2

## 2017-03-22 MED ORDER — HYDROMORPHONE HCL 1 MG/ML IJ SOLN
1.0000 mg | Freq: Once | INTRAMUSCULAR | Status: AC
Start: 2017-03-22 — End: 2017-03-22
  Administered 2017-03-22: 1 mg via INTRAVENOUS
  Filled 2017-03-22: qty 1

## 2017-03-22 MED ORDER — MORPHINE SULFATE (PF) 2 MG/ML IV SOLN: 1.0000 mg | INTRAVENOUS | Status: DC | PRN

## 2017-03-22 MED ORDER — SUGAMMADEX SODIUM 200 MG/2ML IV SOLN
INTRAVENOUS | Status: AC
  Filled 2017-03-22: qty 2

## 2017-03-22 MED ORDER — LIDOCAINE HCL (CARDIAC) 20 MG/ML IV SOLN
INTRAVENOUS | Status: DC | PRN
  Administered 2017-03-22: 100 mg via INTRAVENOUS

## 2017-03-22 MED ORDER — SODIUM CHLORIDE 0.9 % IV SOLN
50.0000 mL/h | INTRAVENOUS | Status: DC
  Administered 2017-03-22: 50 mL/h via INTRAVENOUS

## 2017-03-22 MED ORDER — OXYCODONE HCL 5 MG PO TABS
5.0000 mg | ORAL_TABLET | ORAL | Status: DC | PRN
  Administered 2017-03-22 – 2017-03-23 (×2): 10 mg via ORAL
  Filled 2017-03-22: qty 2

## 2017-03-22 MED ORDER — SUGAMMADEX SODIUM 200 MG/2ML IV SOLN
INTRAVENOUS | Status: DC | PRN
  Administered 2017-03-22: 222.2 mg via INTRAVENOUS

## 2017-03-22 MED ORDER — HYDROMORPHONE HCL 1 MG/ML IJ SOLN: 0.2000 mg | INTRAMUSCULAR | Status: DC | PRN

## 2017-03-22 MED ORDER — ONDANSETRON HCL 4 MG PO TABS: 4.0000 mg | ORAL_TABLET | Freq: Four times a day (QID) | ORAL | Status: DC | PRN

## 2017-03-22 MED ORDER — DOCUSATE SODIUM 100 MG PO CAPS
100.0000 mg | ORAL_CAPSULE | Freq: Two times a day (BID) | ORAL | Status: DC
  Administered 2017-03-23: 100 mg via ORAL
  Filled 2017-03-22: qty 1

## 2017-03-22 MED ORDER — DEXAMETHASONE SODIUM PHOSPHATE 10 MG/ML IJ SOLN
INTRAMUSCULAR | Status: AC
  Filled 2017-03-22: qty 1

## 2017-03-22 MED ORDER — BUPIVACAINE LIPOSOME 1.3 % IJ SUSP
INTRAMUSCULAR | Status: DC | PRN
  Administered 2017-03-22: 20 mL

## 2017-03-22 MED ORDER — ONDANSETRON HCL 4 MG/2ML IJ SOLN: 4.0000 mg | Freq: Four times a day (QID) | INTRAMUSCULAR | Status: DC | PRN

## 2017-03-22 MED ORDER — BUPIVACAINE HCL (PF) 0.5 % IJ SOLN
INTRAMUSCULAR | Status: AC
  Filled 2017-03-22: qty 30

## 2017-03-22 MED ORDER — PHENYLEPHRINE HCL 10 MG/ML IJ SOLN
INTRAMUSCULAR | Status: DC | PRN
  Administered 2017-03-22 (×4): 100 ug via INTRAVENOUS

## 2017-03-22 MED ORDER — ACETAMINOPHEN 325 MG PO TABS
650.0000 mg | ORAL_TABLET | ORAL | Status: DC | PRN
  Administered 2017-03-23 (×2): 650 mg via ORAL
  Filled 2017-03-22 (×2): qty 2

## 2017-03-22 MED ORDER — PROPOFOL 10 MG/ML IV BOLUS
INTRAVENOUS | Status: DC | PRN
  Administered 2017-03-22: 140 mg via INTRAVENOUS

## 2017-03-22 MED ORDER — FENTANYL CITRATE (PF) 100 MCG/2ML IJ SOLN
25.0000 ug | INTRAMUSCULAR | Status: AC | PRN
  Administered 2017-03-22 (×6): 25 ug via INTRAVENOUS

## 2017-03-22 MED ORDER — SIMETHICONE 80 MG PO CHEW: 160.0000 mg | CHEWABLE_TABLET | Freq: Four times a day (QID) | ORAL | Status: DC | PRN

## 2017-03-22 MED ORDER — IBUPROFEN 600 MG PO TABS: 600.0000 mg | ORAL_TABLET | Freq: Four times a day (QID) | ORAL | 1 refills | Status: DC

## 2017-03-22 MED ORDER — ACETAMINOPHEN 10 MG/ML IV SOLN
INTRAVENOUS | Status: DC | PRN
  Administered 2017-03-22: 1000 mg via INTRAVENOUS

## 2017-03-22 MED ORDER — SUCCINYLCHOLINE CHLORIDE 20 MG/ML IJ SOLN
INTRAMUSCULAR | Status: DC | PRN
  Administered 2017-03-22: 100 mg via INTRAVENOUS

## 2017-03-22 MED ORDER — ACETAMINOPHEN 325 MG PO TABS: 650.0000 mg | ORAL_TABLET | Freq: Four times a day (QID) | ORAL | Status: DC

## 2017-03-22 MED ORDER — MENTHOL 3 MG MT LOZG
1.0000 | LOZENGE | OROMUCOSAL | Status: DC | PRN
  Filled 2017-03-22: qty 9

## 2017-03-22 MED ORDER — SODIUM CHLORIDE 0.9 % IV SOLN
Freq: Once | INTRAVENOUS | Status: AC
  Administered 2017-03-22: 17:00:00 via INTRAVENOUS

## 2017-03-22 MED ORDER — DOCUSATE SODIUM 100 MG PO CAPS: 100.0000 mg | ORAL_CAPSULE | Freq: Two times a day (BID) | ORAL | Status: DC

## 2017-03-22 MED ORDER — ONDANSETRON HCL 4 MG/2ML IJ SOLN
INTRAMUSCULAR | Status: DC | PRN
  Administered 2017-03-22: 4 mg via INTRAVENOUS

## 2017-03-22 MED ORDER — ROCURONIUM BROMIDE 100 MG/10ML IV SOLN
INTRAVENOUS | Status: DC | PRN
  Administered 2017-03-22: 50 mg via INTRAVENOUS

## 2017-03-22 MED ORDER — KETOROLAC TROMETHAMINE 30 MG/ML IJ SOLN
30.0000 mg | Freq: Four times a day (QID) | INTRAMUSCULAR | Status: DC
  Administered 2017-03-23: 30 mg via INTRAVENOUS
  Filled 2017-03-22: qty 1

## 2017-03-22 MED ORDER — ACETAMINOPHEN 10 MG/ML IV SOLN
INTRAVENOUS | Status: AC
  Filled 2017-03-22: qty 100

## 2017-03-22 SURGICAL SUPPLY — 53 items
BAG URO DRAIN 2000ML W/SPOUT (MISCELLANEOUS) IMPLANT
BLADE SURG SZ11 CARB STEEL (BLADE) ×4 IMPLANT
CANISTER SUCT 1200ML W/VALVE (MISCELLANEOUS) ×4 IMPLANT
CATH FOLEY 2WAY  5CC 16FR (CATHETERS)
CATH ROBINSON RED A/P 16FR (CATHETERS) ×4 IMPLANT
CATH URTH 16FR FL 2W BLN LF (CATHETERS) IMPLANT
CHLORAPREP W/TINT 26ML (MISCELLANEOUS) ×4 IMPLANT
DERMABOND ADVANCED (GAUZE/BANDAGES/DRESSINGS) ×2
DERMABOND ADVANCED .7 DNX12 (GAUZE/BANDAGES/DRESSINGS) ×2 IMPLANT
DEVICE PMI PUNCTURE CLOSURE (MISCELLANEOUS) ×4 IMPLANT
DRAPE LEGGINS SURG 28X43 STRL (DRAPES) ×4 IMPLANT
DRAPE UNDER BUTTOCK W/FLU (DRAPES) ×4 IMPLANT
DRSG TEGADERM 2-3/8X2-3/4 SM (GAUZE/BANDAGES/DRESSINGS) IMPLANT
DRSG TELFA 4X3 1S NADH ST (GAUZE/BANDAGES/DRESSINGS) IMPLANT
ENDOPOUCH RETRIEVER 10 (MISCELLANEOUS) IMPLANT
GAUZE SPONGE NON-WVN 2X2 STRL (MISCELLANEOUS) IMPLANT
GLOVE PI ORTHOPRO 6.5 (GLOVE) ×2
GLOVE PI ORTHOPRO STRL 6.5 (GLOVE) ×2 IMPLANT
GLOVE SURG SYN 6.5 ES PF (GLOVE) ×4 IMPLANT
GOWN STRL REUS W/ TWL LRG LVL3 (GOWN DISPOSABLE) ×4 IMPLANT
GOWN STRL REUS W/ TWL XL LVL3 (GOWN DISPOSABLE) IMPLANT
GOWN STRL REUS W/TWL LRG LVL3 (GOWN DISPOSABLE) ×4
GOWN STRL REUS W/TWL XL LVL3 (GOWN DISPOSABLE)
GRASPER SUT TROCAR 14GX15 (MISCELLANEOUS) IMPLANT
IRRIGATION STRYKERFLOW (MISCELLANEOUS) IMPLANT
IRRIGATOR STRYKERFLOW (MISCELLANEOUS)
IV LACTATED RINGERS 1000ML (IV SOLUTION) IMPLANT
KIT PINK PAD W/HEAD ARE REST (MISCELLANEOUS) ×4
KIT PINK PAD W/HEAD ARM REST (MISCELLANEOUS) ×2 IMPLANT
KIT RM TURNOVER CYSTO AR (KITS) ×4 IMPLANT
LABEL OR SOLS (LABEL) IMPLANT
LIGASURE LAP MARYLAND 5MM 37CM (ELECTROSURGICAL) IMPLANT
LIGASURE VESSEL 5MM BLUNT TIP (ELECTROSURGICAL) ×4 IMPLANT
MANIPULATOR UTERINE 4.5 ZUMI (MISCELLANEOUS) IMPLANT
NEEDLE VERESS 14GA 120MM (NEEDLE) ×4 IMPLANT
NS IRRIG 500ML POUR BTL (IV SOLUTION) ×4 IMPLANT
PACK LAP CHOLECYSTECTOMY (MISCELLANEOUS) ×4 IMPLANT
PAD OB MATERNITY 4.3X12.25 (PERSONAL CARE ITEMS) ×4 IMPLANT
PAD PREP 24X41 OB/GYN DISP (PERSONAL CARE ITEMS) ×4 IMPLANT
PENCIL ELECTRO HAND CTR (MISCELLANEOUS) ×4 IMPLANT
SCISSORS METZENBAUM CVD 33 (INSTRUMENTS) ×4 IMPLANT
SHEARS HARMONIC ACE PLUS 36CM (ENDOMECHANICALS) IMPLANT
SLEEVE ENDOPATH XCEL 5M (ENDOMECHANICALS) ×8 IMPLANT
SLEEVE SCD COMPRESS THIGH MED (MISCELLANEOUS) ×4 IMPLANT
SPONGE VERSALON 2X2 STRL (MISCELLANEOUS)
SUT MNCRL AB 4-0 PS2 18 (SUTURE) ×4 IMPLANT
SUT VIC AB 0 CT1 36 (SUTURE) ×4 IMPLANT
SUT VIC AB 2-0 UR6 27 (SUTURE) ×4 IMPLANT
SUT VIC AB 4-0 PS2 18 (SUTURE) IMPLANT
SYRINGE 10CC LL (SYRINGE) ×4 IMPLANT
TROCAR ENDO BLADELESS 11MM (ENDOMECHANICALS) ×4 IMPLANT
TROCAR XCEL NON-BLD 5MMX100MML (ENDOMECHANICALS) ×4 IMPLANT
TUBING INSUFFLATOR HI FLOW (MISCELLANEOUS) ×4 IMPLANT

## 2017-03-22 NOTE — Discharge Instructions (Signed)
Discharge instructions:  Call office if you have any of the following: fever >101 F, chills, excessive vaginal bleeding, incision drainage or problems, leg pain or redness, or any other concerns.   Activity: Do not lift > 15 lbs for 2 weeks   You may feel some pain in your upper right abdomen/rib and right shoulder.  This is from the gas in the abdomen for surgery. This will subside over time, please be patient!  Take 600mg  Ibuprofen and 1000mg  Tylenol around the clock, every 6 hours for at least the first 3-5 days.  After this you can take as needed.  This will help decrease inflammation and promote healing.  The narcotics you'll take just as needed, as they just trick your brain into thinking its not in pain.    Please don't limit yourself in terms of routine activity.  You will be able to do most things, although they may take longer to do or be a little painful.  You can do it!  Don't be a hero, but don't be a wimp either!

## 2017-03-22 NOTE — Transfer of Care (Signed)
Immediate Anesthesia Transfer of Care Note  Patient: Amy Mooney  Procedure(s) Performed: Procedure(s): LAPAROSCOPIC SALPINGO OOPHORECTOMY (Right)  Patient Location: PACU  Anesthesia Type:General  Level of Consciousness: sedated  Airway & Oxygen Therapy: Patient Spontanous Breathing and Patient connected to face mask oxygen  Post-op Assessment: Report given to RN and Post -op Vital signs reviewed and stable  Post vital signs: Reviewed and stable  Last Vitals:  Vitals:   03/22/17 1504 03/22/17 1828  BP: (!) 172/89 (!) 149/78  Pulse: 81 76  Resp: 16 16  Temp: 36.7 C     Last Pain:  Vitals:   03/22/17 1638  TempSrc:   PainSc: 10-Worst pain ever         Complications: No apparent anesthesia complications

## 2017-03-22 NOTE — ED Notes (Signed)
Dr Leonides Schanz at patient's bedside

## 2017-03-22 NOTE — ED Provider Notes (Signed)
[facility] Emergency Department Provider Note       Time seen: ----------------------------------------- 4:17 PM on 03/22/2017 -----------------------------------------     I have reviewed the triage vital signs and the nursing notes.   HISTORY   Chief Complaint Abdominal Pain    HPI Amy Mooney is a 51 y.o. female who presents to the ED for right lower quadrant abdominal pain that started earlier this morning. Patient states this started as a dull pain and nausea sharp and stabbing pain. She denies fevers, chills, chest pain, shortness of breath, vomiting or diarrhea. She's never had it before. Pain is currently 10 out of 10.   Past Medical History:  Diagnosis Date  . Chronic knee pain   . Migraines   . Morbid obesity with BMI of 40.0-44.9, adult ([place])   . 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 Oral herpes   . Restless leg     Patient Active Problem List   Diagnosis Date Noted  . Preventative health care 03/30/2016  . Restless leg 03/19/2016  . Chronic knee pain 03/19/2016  . Elevated blood pressure 03/19/2016  . Severe obesity (BMI >= 40) ([place]) 06/24/2014  . Migraine without aura 02/21/2013    Past Surgical History:  Procedure Laterality Date  . ABDOMINAL HYSTERECTOMY  2010  . artroscopic meniscus repair      Allergies Nuvigil [armodafinil] and Penicillins  Social History Social History  Substance Use Topics  . Smoking status: Never Smoker  . Smokeless tobacco: Never Used  . Alcohol use No    Review of Systems Constitutional: Negative for fever. Eyes: Negative for vision changes ENT:  Negative for congestion, sore throat Cardiovascular: Negative for chest pain. Respiratory: Negative for shortness of breath. Gastrointestinal: Positive for abdominal pain Genitourinary: Negative for dysuria. Musculoskeletal: Negative for back pain. Skin: Negative for rash. Neurological: Negative for headaches, focal weakness or  numbness.  All systems negative/normal/unremarkable except as stated in the HPI  ____________________________________________   PHYSICAL EXAM:  VITAL SIGNS: ED Triage Vitals [03/22/17 1504]  Enc Vitals Group     BP (!) 172/89     Pulse Rate 81     Resp 16     Temp 98 F (36.7 C)     Temp Source Oral     SpO2 100 %     Weight 245 lb (111.1 kg)     Height 5\' 2"  (1.575 m)     Head Circumference      Peak Flow      Pain Score 10     Pain Loc      Pain Edu?      Excl. in [place]?     Constitutional: Alert and oriented. Mild to moderate distress Eyes: Conjunctivae are normal. Normal extraocular movements. ENT   Head: Normocephalic and atraumatic.   Nose: No congestion/rhinnorhea.   Mouth/Throat: Mucous membranes are moist.   Neck: No stridor. Cardiovascular: Normal rate, regular rhythm. No murmurs, rubs, or gallops. Respiratory: Normal respiratory effort without tachypnea nor retractions. Breath sounds are clear and equal bilaterally. No wheezes/rales/rhonchi. Gastrointestinal: Right lower quadrant tenderness, no rebound or guarding. Normal bowel sounds. Musculoskeletal: Nontender with normal range of motion in extremities. No lower extremity tenderness nor edema. Neurologic:  Normal speech and language. No gross focal neurologic deficits are appreciated.  Skin:  Skin is warm, dry and intact. No rash noted. Psychiatric: Mood and affect are normal. Speech and behavior are normal.  ____________________________________________  ED COURSE:  Pertinent labs & imaging results that  were available during my care of the patient were reviewed by me and considered in my medical decision making (see chart for details). Patient presents for right lower quadrant pain, we will assess with labs and imaging as indicated.   Procedures ____________________________________________   LABS (pertinent positives/negatives)  Labs Reviewed  COMPREHENSIVE METABOLIC PANEL - Abnormal;  Notable for the following:       Result Value   Sodium 133 (*)    Potassium 3.4 (*)    Chloride 99 (*)    All other components within normal limits  CBC - Abnormal; Notable for the following:    RDW 15.0 (*)    All other components within normal limits  URINALYSIS, COMPLETE (UACMP) WITH MICROSCOPIC - Abnormal; Notable for the following:    Color, Urine YELLOW (*)    APPearance CLEAR (*)    Squamous Epithelial / LPF 0-5 (*)    All other components within normal limits  LIPASE, BLOOD    RADIOLOGY Images were viewed by me  CT renal protocol IMPRESSION: 1. Up to 7 cm cystic and solid right hemipelvis mass, inseparable from distal small bowel but favored to be ovarian in origin. Pelvic Ultrasound would characterize further but Pelvis MRI (GYN protocol without and with contrast) may ultimately be necessary. 2. Trace pelvic free fluid in the cul-de-sac, but otherwise no ascites. No pelvic lymphadenopathy. 3. Normal appendix. Diverticulosis, most pronounced in the transverse colon. 4. Fatty liver disease. IMPRESSION: 6.8 cm complex cystic and solid lesion in the right adnexal space, corresponding to the abnormality seen at CT. The course of the gonadal vasculature on the CT scan is compatible with this being right ovarian origin. Ultrasound imaging shows no detectable blood flow within most of the lesion with only a minimal amount of arterial and venous flow identified along a single margin of the lesion on pulse Doppler assessment. Ultrasound imaging features highly suspicious for ovarian torsion.  Small volume simple appearing intraperitoneal free fluid.  ____________________________________________  FINAL ASSESSMENT AND PLAN  Ovarian torsion  Plan: Patient's labs and imaging were dictated above. Patient had presented for severe right sided pelvic pain that was gradual in onset but suddenly much more severe today. Ultrasound findings were concerning for ovarian torsion. I  will discuss with [place] doctor on call for emergent evaluation.   Earleen Newport, MD   Note: This note was generated in part or whole with voice recognition software. Voice recognition is usually quite accurate but there are transcription errors that can and very often do occur. I apologize for any typographical errors that were not detected and corrected.     Earleen Newport, MD 03/22/17 [id number]

## 2017-03-22 NOTE — Progress Notes (Signed)
Report given at 2340.

## 2017-03-22 NOTE — ED Notes (Signed)
Report to OR RN.

## 2017-03-22 NOTE — Op Note (Signed)
03/22/2017  PATIENT:  Amy Mooney  51 y.o. female  PRE-OPERATIVE DIAGNOSIS:  OVARIAN TORSION  POST-OPERATIVE DIAGNOSIS:  Right ovarian torsion  PROCEDURE:  Procedure(s): LAPAROSCOPIC SALPINGO OOPHORECTOMY (Right)  SURGEON:  Surgeon(s) and Role:    * Ward, Honor Loh, MD - Primary  ANESTHESIA: GET  EBL:  Total I/O In: 800 [I.V.:800] Out: 25 [Blood:25] UOP none  DRAINS: none  SPECIMEN: Right tube and ovary, pelvic washings  DISPOSITION OF SPECIMEN:  To pathology  COUNTS: correct x2  COMPLICATIONS: none apparent  PATIENT DISPOSITION:  VS stable to PACU   Indication for surgery: Patient has history of [place] with LSO, trachelectomy with vaginal vault repair, and presented to ED with acute onset pain on right side; was found on imaging to have an ovarian torsion.  Risks, benefits and alternatives discussed with patient.   Procedure: The patient was brought to the OR and identified as Amy Mooney.  She was given general anesthesia via endotracheal route, positioned supine, and prepped and draped in the usual sterile fashion.  A surgical time-out was called. A periumbilical incision was made, and using the visiport method, a 69mm trochar was inserted into the abdominal cavity.  Opening pressure was 54mm Hg. Pneumoperitoneum was created to 1mmHg.   Brief survey of the abdomen was performed and appeared normal.  Two 68mm trochar was placed in the left and right quadrants under visualization.    The right tube was rubrous and the right ovary was necrotic.  They were rotated on their axis 180 degrees.   There were no other abnormal structures.  Due to the character of the ovary, pelvic washings were obtained for cytology.  There were no gross abnormalities of the peritoneal surfaces. The right ureter was far below the surgical field.  The Ligasure was used to thrice burn then transect the IP ligament. The right trochar site was enlarged to accommodate a 59mm trochar.  The tube and ovary were  placed in an endocatch bag which was retracted.  The incision was again enlarged so the specimen could be removed in the bag.   Once the specimen was removed, the pneumoperitoneum was deflated then recreated and all operative sites were hemostatic.    The inlet closure device was used to close the fascia of the right port site.  Two figures-of-eight were placed to obtain sufficient closure.  The pneumoperitoneum was deflated and trochars removed.  20cc long-acting and 10cc short-acting bupivacaine was injected into the fascia and surrounding muscle/subcutaneous skin of the three port sites,  The skin incisions were reapproximated with 4-0 monocryl.  The skin was then closed with sugical glue.    The patient tolerated the procedure, the sponge, needle, and instrument counts were correct x2, and the patient was brought to PACU extubated, and in a stable condition.  I was present for, and performed this procedure in its entirety.  ----- Larey Days, MD Attending Obstetrician and Gynecologist [facility], Department of [facility]

## 2017-03-22 NOTE — Anesthesia Preprocedure Evaluation (Addendum)
Anesthesia Evaluation  Patient identified by MRN, date of birth, ID band Patient awake    Reviewed: Allergy & Precautions, NPO status , Patient's Chart, lab work & pertinent test results, reviewed documented beta blocker date and time   Airway Mallampati: III  TM Distance: >3 FB     Dental  (+) Chipped   Pulmonary           Cardiovascular      Neuro/Psych    GI/Hepatic   Endo/Other  Morbid obesity  Renal/GU      Musculoskeletal   Abdominal   Peds  Hematology   Anesthesia Other Findings Rls. Obese.  Reproductive/Obstetrics                            Anesthesia Physical Anesthesia Plan  ASA: III  Anesthesia Plan: General   Post-op Pain Management:    Induction: Intravenous  Airway Management Planned: Oral ETT  Additional Equipment:   Intra-op Plan:   Post-operative Plan:   Informed Consent: I have reviewed the patients History and Physical, chart, labs and discussed the procedure including the risks, benefits and alternatives for the proposed anesthesia with the patient or authorized representative who has indicated his/her understanding and acceptance.     Plan Discussed with: CRNA  Anesthesia Plan Comments:         Anesthesia Quick Evaluation

## 2017-03-22 NOTE — ED Notes (Signed)
Patient transported to CT 

## 2017-03-22 NOTE — Anesthesia Procedure Notes (Signed)
Procedure Name: Intubation Date/Time: 03/22/2017 8:20 PM Performed by: Nelda Marseille Pre-anesthesia Checklist: Patient identified, Patient being monitored, Timeout performed, Emergency Drugs available and Suction available Patient Re-evaluated:Patient Re-evaluated prior to inductionOxygen Delivery Method: Circle system utilized Preoxygenation: Pre-oxygenation with 100% oxygen Intubation Type: IV induction Ventilation: Mask ventilation without difficulty Laryngoscope Size: Mac, 3 and McGraph Grade View: Grade III Tube type: Oral Tube size: 7.0 mm Number of attempts: 1 Airway Equipment and Method: Stylet and Video-laryngoscopy Placement Confirmation: ETT inserted through vocal cords under direct vision,  positive ETCO2 and breath sounds checked- equal and bilateral Secured at: 21 cm Tube secured with: Tape Dental Injury: Teeth and Oropharynx as per pre-operative assessment

## 2017-03-22 NOTE — ED Triage Notes (Signed)
Pt comes into the ED via POV c/o RLQ abdominal pain that started earlier this morning and significantly progressed after eating lunch.  Patient denies any N/V/D but states the pain is sharp.  Patient tearful in triage at this time. Patient denies any chest pain or shortness of breath.

## 2017-03-22 NOTE — H&P (Signed)
Consult History and Physical   SERVICE: Gynecology   Patient Name: Amy Mooney Patient MRN:   [id number]  CC: acute abdominal pain  HPI: Amy Mooney is a 51 y.o. s/p hysterectomy, LSO and Right salpingectomy (retaining right ovary) then trachelectomy and pelvic floor reconstruction (colpopexy, uterosacral ligament plication, anterior colporrhaphy, TVT sling) who about a month ago had a 24hr episode of acute pain which spontaneously resolved.  This morning, she had another episode of acute right sided pelvic pain, which has doubled her over throughout the day.  She was unable to lay straight or stand straight.  CT scan then Ultrasound shows a complex right ovarian cystic structure without evidence of blood flow - clinically diagnostic of ovarian torsion. She has migraine with aura thus cannot take estrogen, she is in perimenopause, having a few daily hot flushes starting about a month or two ago.  She denies any cardiac, pulmonary, renal, vascular, or nervous system chronic issues.  She has narcolepsy, mild.   Review of Systems: positives in bold GEN:   fevers, chills, weight changes, appetite changes, fatigue, night sweats HEENT:  HA, vision changes, hearing loss, congestion, rhinorrhea, sinus pressure, dysphagia CV:   CP, palpitations PULM:  SOB, cough GI:  abd pain, N/V/D/C GU:  dysuria, urgency, frequency MSK:  arthralgias, myalgias, back pain, swelling SKIN:  rashes, color changes, pallor NEURO:  numbness, weakness, tingling, seizures, dizziness, tremors PSYCH:  depression, anxiety, behavioral problems, confusion  HEME/LYMPH:  easy bruising or bleeding ENDO:  heat/cold intolerance  Past Obstetrical History: OB History    No data available    G2P2 s/p SVD x2  Past Gynecologic History: No LMP recorded. Patient has had a hysterectomy.  S/p supracervical hysterectomy, with LSO.   Then s/p colpopexy, uterosacral ligament plication, anterior colporrhaphy, TVT sling  Past  Medical History: Past Medical History:  Diagnosis Date  . Chronic knee pain   . Migraines   . Morbid obesity with BMI of 40.0-44.9, adult ([place])   . 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 Oral herpes   . Restless leg     Past Surgical History:   Past Surgical History:  Procedure Laterality Date  . Supracervical  HYSTERECTOMY  2010  . artroscopic meniscus repair      Family History:  family history includes Breast cancer in her paternal grandmother; Breast cancer (age of onset: 69) in her mother; Diabetes in her mother.  Social History:  Social History   Social History  . Marital status: Married    Spouse name: [name]  . Number of children: 2  . Years of education: College   Occupational History  .  [employer]   Social History Main Topics  . Smoking status: Never Smoker  . Smokeless tobacco: Never Used  . Alcohol use No  . Drug use: No  . Sexual activity: Not on file   Other Topics Concern  . Not on file   Social History Narrative   [name])   Patient has two adult children.   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   Patient has an college education.   Patient is right-handed.   Patient drinks very little caffeine.   Enjoys spending time with family.     Home Medications:  Medications reconciled in EPIC  No current facility-administered medications on file prior to encounter.    Current Outpatient Prescriptions on File Prior to Encounter  Medication Sig Dispense Refill  . diclofenac (CATAFLAM)  50 MG tablet Take 1 tablet (50 mg total) by mouth 2 (two) times daily. 60 tablet 2  . pramipexole (MIRAPEX) 0.25 MG tablet TAKE 1 TABLET BY MOUTH 30 MINUTES TO 1 HOUR PRIOR TO BEDTIME. 90 tablet 1  . rizatriptan (MAXALT) 10 MG tablet Take 1 tablet by mouth at migraine onset, may repeat in 2 hours if headache persists. Do not exceed 2 tablets in 24 hours. 10 tablet 5    Allergies:  Allergies  Allergen  Reactions  . Nuvigil [Armodafinil] Hives  . Penicillins Rash    Has patient had a PCN reaction causing immediate rash, facial/tongue/throat swelling, SOB or lightheadedness with hypotension: Unknown Has patient had a PCN reaction causing severe rash involving mucus membranes or skin necrosis: Unknown Has patient had a PCN reaction that required hospitalization: Unknown Has patient had a PCN reaction occurring within the last 10 years: Unknown If all of the above answers are "NO", then may proceed with Cephalosporin use.     Physical Exam:  Temp:  [98 F (36.7 C)] 98 F (36.7 C) (05/31 1504) Pulse Rate:  [76-81] 76 (05/31 1828) Resp:  [16] 16 (05/31 1828) BP: (149-172)/(78-89) 149/78 (05/31 1828) SpO2:  [97 %-100 %] 97 % (05/31 1828) Weight:  [111.1 kg (245 lb)] 111.1 kg (245 lb) (05/31 1504)  She has been treated in the ED with dilaudid, she is coherent and well-mannered.  She is not currently in acute pain but this wanes when the medication wears off per patient. General Appearance:  Well developed, well nourished, no acute distress, alert and oriented, cooperative and appears stated age 51:  Normocephalic atraumatic, extraocular movements intact, moist mucous membranes, neck supple with midline trachea and thyroid without masses Cardiovascular:  Normal S1/S2, regular rate and rhythm, no murmurs, 2+ distal pulses Pulmonary:  clear to auscultation, no wheezes, rales or rhonchi, symmetric air entry, good air exchange Abdomen:  Bowel sounds present, soft, tender in RLQ, nondistended, no abnormal masses or organomegaly, no epigastric pain Back: inspection of back is normal Extremities:  extremities normal, no tenderness, atraumatic, no cyanosis or edema Skin:  normal coloration and turgor, no rashes, no suspicious skin lesions noted  Neurologic:  Cranial nerves 2-12 grossly intact, grossly equal strength and muscle tone, normal speech, no focal findings or movement disorder  noted. Psychiatric:  Normal mood and affect, appropriate, no AH/VH Pelvic:  deferred   Labs/Studies:   CBC and Coags:  Lab Results  Component Value Date   WBC 9.9 03/22/2017   NEUTOPHILPCT 51 11/17/2016   HGB 13.1 03/22/2017   HCT 39.0 03/22/2017   MCV 82.1 03/22/2017   PLT 197 03/22/2017   CMP:  Lab Results  Component Value Date   NA 133 (L) 03/22/2017   K 3.4 (L) 03/22/2017   CL 99 (L) 03/22/2017   CO2 26 03/22/2017   BUN 15 03/22/2017   CREATININE 0.89 03/22/2017   CREATININE 0.75 03/24/2016   CREATININE 0.86 05/23/2013   PROT 8.1 03/22/2017   BILITOT 1.0 03/22/2017   ALT 35 03/22/2017   AST 33 03/22/2017   ALKPHOS 82 03/22/2017    US Transvaginal Non-ob  Result Date: 03/22/2017 CLINICAL DATA:  Worsening pelvic pain since this morning. Patient is status post hysterectomy and unilateral oophorectomy although patient is unsure which ovary was removed. EXAM: TRANSABDOMINAL AND TRANSVAGINAL ULTRASOUND OF PELVIS DOPPLER ULTRASOUND OF OVARIES TECHNIQUE: Both transabdominal and transvaginal ultrasound examinations of the pelvis were performed. Transabdominal technique was performed for global imaging of the pelvis including  uterus, ovaries, adnexal regions, and pelvic cul-de-sac. It was necessary to proceed with endovaginal exam following the transabdominal exam to visualize the right ovary. Color and duplex Doppler ultrasound was utilized to evaluate blood flow to the ovaries. COMPARISON:  CT scan from earlier today. FINDINGS: Uterus Measurements: Surgically absent. Endometrium Thickness: N/A. Right ovary Complex right adnexal mass measures 6.8 x 4.2 x 4.5 cm. This corresponds to the abnormality seen on the CT scan earlier today. This structure is complex with multi cystic change in areas of solid soft tissue. Collar and pulsed Doppler evaluation of the right adnexal mass shows little to no blood flow can be detectable flow on pulsed Doppler imaging is at the extreme periphery of  the lesion. Left ovary Not visualized Other findings Small volume of simple appearing free fluid is identified in the right adnexal space, adjacent to the adnexal mass. IMPRESSION: 6.8 cm complex cystic and solid lesion in the right adnexal space, corresponding to the abnormality seen at CT. The course of the gonadal vasculature on the CT scan is compatible with this being right ovarian origin. Ultrasound imaging shows no detectable blood flow within most of the lesion with only a minimal amount of arterial and venous flow identified along a single margin of the lesion on pulse Doppler assessment. Ultrasound imaging features highly suspicious for ovarian torsion. Small volume simple appearing intraperitoneal free fluid. Electronically Signed   By: Misty Stanley M.D.   On: 03/22/2017 17:45   US Pelvis Complete  Result Date: 03/22/2017 CLINICAL DATA:  Worsening pelvic pain since this morning. Patient is status post hysterectomy and unilateral oophorectomy although patient is unsure which ovary was removed. EXAM: TRANSABDOMINAL AND TRANSVAGINAL ULTRASOUND OF PELVIS DOPPLER ULTRASOUND OF OVARIES TECHNIQUE: Both transabdominal and transvaginal ultrasound examinations of the pelvis were performed. Transabdominal technique was performed for global imaging of the pelvis including uterus, ovaries, adnexal regions, and pelvic cul-de-sac. It was necessary to proceed with endovaginal exam following the transabdominal exam to visualize the right ovary. Color and duplex Doppler ultrasound was utilized to evaluate blood flow to the ovaries. COMPARISON:  CT scan from earlier today. FINDINGS: Uterus Measurements: Surgically absent. Endometrium Thickness: N/A. Right ovary Complex right adnexal mass measures 6.8 x 4.2 x 4.5 cm. This corresponds to the abnormality seen on the CT scan earlier today. This structure is complex with multi cystic change in areas of solid soft tissue. Collar and pulsed Doppler evaluation of the right  adnexal mass shows little to no blood flow can be detectable flow on pulsed Doppler imaging is at the extreme periphery of the lesion. Left ovary Not visualized Other findings Small volume of simple appearing free fluid is identified in the right adnexal space, adjacent to the adnexal mass. IMPRESSION: 6.8 cm complex cystic and solid lesion in the right adnexal space, corresponding to the abnormality seen at CT. The course of the gonadal vasculature on the CT scan is compatible with this being right ovarian origin. Ultrasound imaging shows no detectable blood flow within most of the lesion with only a minimal amount of arterial and venous flow identified along a single margin of the lesion on pulse Doppler assessment. Ultrasound imaging features highly suspicious for ovarian torsion. Small volume simple appearing intraperitoneal free fluid. Electronically Signed   By: Misty Stanley M.D.   On: 03/22/2017 17:45     Ct Renal Stone Study  Result Date: 03/22/2017 CLINICAL DATA:  51 year old female with right lower quadrant abdominal and flank pain, onset this morning with progression.  EXAM: CT ABDOMEN AND PELVIS WITHOUT CONTRAST TECHNIQUE: Multidetector CT imaging of the abdomen and pelvis was performed following the standard protocol without IV contrast. COMPARISON:  None. FINDINGS: Lower chest: Normal lung bases.  No pericardial or pleural effusion. Hepatobiliary: Generalized hepatic steatosis.  Negative gallbladder. Pancreas: Negative. Spleen: Negative. Adrenals/Urinary Tract: Normal adrenal glands. Normal noncontrast left kidney and left ureter. Normal noncontrast right kidney and proximal right ureter. The distal right ureter pres difficult to delineate. The urinary bladder is decompressed, appears separate from the right pelvic mass and is unremarkable. Stomach/Bowel: Decompressed rectosigmoid colon, separate from the right pelvic mass described earlier. Mild diverticulosis in the left colon, no active  inflammation. Mild to moderate diverticulosis in the transverse colon, primarily the proximal segment. No active inflammation. Redundant hepatic flexure. Negative right colon and retrocecal appendix. Negative terminal ileum. Small bowel loops in the lower abdomen and pelvis are decompressed. Several are inseparable from the right pelvic mass. No dilated or inflamed small bowel is identified. Negative stomach and duodenum. No abdominal free fluid or free air. Vascular/Lymphatic: Vascular patency is not evaluated in the absence of IV contrast. No lymphadenopathy identified in the abdomen or pelvis. Reproductive: The uterus appears surgically absent. No left adnexa is identified. In the right hemipelvis there is a complex cystic and solid mass which is partially inseparable from distal small bowel loops but estimated at 70 x 46 x 59 mm (AP by transverse by CC). See series 2, image 70, sagittal image 80, and coronal image 105. Other: Trace pelvic free fluid in the cul-de-sac. Musculoskeletal: Lumbar facet degeneration. No acute osseous abnormality identified. IMPRESSION: 1. Up to 7 cm cystic and solid right hemipelvis mass, inseparable from distal small bowel but favored to be ovarian in origin. Pelvic Ultrasound would characterize further but Pelvis MRI (GYN protocol without and with contrast) may ultimately be necessary. 2. Trace pelvic free fluid in the cul-de-sac, but otherwise no ascites. No pelvic lymphadenopathy. 3. Normal appendix. Diverticulosis, most pronounced in the transverse colon. 4. Fatty liver disease. Electronically Signed   By: Genevie Ann M.D.   On: 03/22/2017 16:41     Assessment / Plan:   Amy Mooney is a 51 y.o. with ovarian torsion  1. Emergency explained to patient, and she agrees to surgical intervention.  Planned surgery: laparoscopic right ovarian cystectomy vs oophorectomy.  She is in perimenopause and removing the ovary in its entirety would send her definitively into menopause.  She  is limited in treatment modalities, although non-estrogen medications as well as acupuncture can be used with success.   2. Called OR team, they need to bring in a 2nd team due to other also emergent case.  Consent signed with patient.  She understands risks of bleeding, infection, damage to nearby organs specifically bowel and ureter.  She would like to stay overnight since her husband is out of town. Will make arrangements for this.    To OR when available.   Thank you for the opportunity to be involved with this patient's care.  ----- Larey Days, MD Attending Obstetrician and Gynecologist [facility], Department of [facility]

## 2017-03-22 NOTE — Anesthesia Post-op Follow-up Note (Cosign Needed)
Anesthesia QCDR form completed.        

## 2017-03-23 ENCOUNTER — Encounter: Payer: Self-pay | Admitting: Obstetrics & Gynecology

## 2017-03-23 DIAGNOSIS — Z79899 Other long term (current) drug therapy: Secondary | ICD-10-CM | POA: Diagnosis not present

## 2017-03-23 DIAGNOSIS — G43009 Migraine without aura, not intractable, without status migrainosus: Secondary | ICD-10-CM | POA: Diagnosis not present

## 2017-03-23 DIAGNOSIS — G8929 Other chronic pain: Secondary | ICD-10-CM | POA: Diagnosis not present

## 2017-03-23 DIAGNOSIS — N83511 Torsion of right ovary and ovarian pedicle: Secondary | ICD-10-CM | POA: Diagnosis not present

## 2017-03-23 DIAGNOSIS — Z6841 Body Mass Index (BMI) 40.0 and over, adult: Secondary | ICD-10-CM | POA: Diagnosis not present

## 2017-03-23 DIAGNOSIS — G2581 Restless legs syndrome: Secondary | ICD-10-CM | POA: Diagnosis not present

## 2017-03-23 DIAGNOSIS — Z880 Allergy status to penicillin: Secondary | ICD-10-CM | POA: Diagnosis not present

## 2017-03-23 LAB — BASIC METABOLIC PANEL
ANION GAP: 8 (ref 5–15)
BUN: 11 mg/dL (ref 6–20)
CHLORIDE: 106 mmol/L (ref 101–111)
CO2: 23 mmol/L (ref 22–32)
Calcium: 8.4 mg/dL — ABNORMAL LOW (ref 8.9–10.3)
Creatinine, Ser: 0.84 mg/dL (ref 0.44–1.00)
GFR calc Af Amer: >60 mL/min (ref >60)
Glucose, Bld: 146 mg/dL — ABNORMAL HIGH (ref 65–99)
POTASSIUM: 3.9 mmol/L (ref 3.5–5.1)
SODIUM: 137 mmol/L (ref 135–145)

## 2017-03-23 LAB — CBC
HCT: 40.4 % (ref 35.0–47.0)
HEMOGLOBIN: 13.5 g/dL (ref 12.0–16.0)
MCH: 28 pg (ref 26.0–34.0)
MCHC: 33.4 g/dL (ref 32.0–36.0)
MCV: 83.9 fL (ref 80.0–100.0)
Platelets: 215 10*3/uL (ref 150–440)
RBC: 4.81 MIL/uL (ref 3.80–5.20)
RDW: 15 % — ABNORMAL HIGH (ref 11.5–14.5)
WBC: 10.3 10*3/uL (ref 3.6–11.0)

## 2017-03-23 MED ORDER — IBUPROFEN 600 MG PO TABS
600.0000 mg | ORAL_TABLET | Freq: Four times a day (QID) | ORAL | Status: DC
  Administered 2017-03-23: 600 mg via ORAL
  Filled 2017-03-23: qty 1

## 2017-03-23 MED ORDER — ACETAMINOPHEN 500 MG PO TABS
1000.0000 mg | ORAL_TABLET | Freq: Four times a day (QID) | ORAL | Status: DC
  Administered 2017-03-23: 1000 mg via ORAL
  Filled 2017-03-23: qty 2

## 2017-03-23 NOTE — Progress Notes (Signed)
Patient discharged home with family. Discharge instructions, prescriptions and follow up appointment given to and reviewed with patient and family. Patient verbalized understanding. Escorted out via wheelchair by auxiliary.  

## 2017-03-23 NOTE — Anesthesia Postprocedure Evaluation (Signed)
Anesthesia Post Note  Patient: Amy Mooney  Procedure(s) Performed: Procedure(s) (LRB): LAPAROSCOPIC SALPINGO OOPHORECTOMY (Right)  Patient location during evaluation: PACU Anesthesia Type: General Level of consciousness: awake and alert Pain management: pain level controlled Vital Signs Assessment: post-procedure vital signs reviewed and stable Respiratory status: spontaneous breathing, nonlabored ventilation, respiratory function stable and patient connected to nasal cannula oxygen Cardiovascular status: blood pressure returned to baseline and stable Postop Assessment: no signs of nausea or vomiting Anesthetic complications: no     Last Vitals:  Vitals:   03/23/17 0302 03/23/17 0702  BP: (!) 142/75 119/70  Pulse: 81 76  Resp: 18 18  Temp: 36.7 C 36.6 C    Last Pain:  Vitals:   03/23/17 0702  TempSrc: Oral  PainSc:                  Hoang Reich S

## 2017-03-23 NOTE — Discharge Summary (Signed)
Gynecology Physician Postoperative Discharge Summary  Patient ID: Amy Mooney MRN: [id number] DOB/AGE: Aug 24, 1966 51 y.o.  Admit Date: 03/22/2017 Discharge Date: 03/23/2017  Preoperative Diagnoses: right ovarian torsion  Procedures: Procedure(s) (LRB): LAPAROSCOPIC SALPINGO OOPHORECTOMY (Right)  CBC Latest Ref Rng & Units 03/23/2017 03/22/2017 11/17/2016  WBC 3.6 - 11.0 K/uL 10.3 9.9 9.3  Hemoglobin 12.0 - 16.0 g/dL 13.5 13.1 -  Hematocrit 35.0 - 47.0 % 40.4 39.0 39.1  Platelets 150 - 440 K/uL 215 197 225    Hospital Course:  Amy Mooney is a 51 y.o.  admitted from the ED for acute pain suspicious of ovarian torsion.  She was brought to the OR for emergency surgery.  She underwent the procedures as mentioned above, her operation was uncomplicated. For further details about surgery, please refer to the operative report. Patient stayed overnight for pain control. By time of discharge on POD#1, her pain was controlled on oral pain medications; she was ambulating, voiding without difficulty, tolerating regular diet and passing flatus. She was deemed stable for discharge to home.   Discharge Exam: Blood pressure 119/70, pulse 76, temperature 97.8 F (36.6 C), temperature source Oral, resp. rate 18, height 5\' 2"  (1.575 m), weight 111.1 kg (245 lb), SpO2 94 %. General appearance: alert and no distress  Resp: clear to auscultation bilaterally, normal respiratory effort Cardio: regular rate and rhythm  GI: soft, non-tender; bowel sounds normal; no masses, no organomegaly.  Incision: C/D/I, no erythema, no drainage noted Extremities: extremities normal, atraumatic, no cyanosis or edema and Homans sign is negative, no sign of DVT  Discharged Condition: Stable  Disposition: 01-Home or Self Care   Allergies as of 03/23/2017      Reactions   Nuvigil [armodafinil] Hives   Penicillins Rash   Has patient had a PCN reaction causing immediate rash, facial/tongue/throat swelling, SOB or  lightheadedness with hypotension: Unknown Has patient had a PCN reaction causing severe rash involving mucus membranes or skin necrosis: Unknown Has patient had a PCN reaction that required hospitalization: Unknown Has patient had a PCN reaction occurring within the last 10 years: Unknown If all of the above answers are "NO", then may proceed with Cephalosporin use.      Medication List    TAKE these medications   diclofenac 50 MG tablet Commonly known as:  CATAFLAM Take 1 tablet (50 mg total) by mouth 2 (two) times daily.   ibuprofen 600 MG tablet Commonly known as:  ADVIL,MOTRIN Take 1 tablet (600 mg total) by mouth every 6 (six) hours.   oxyCODONE 5 MG immediate release tablet Commonly known as:  ROXICODONE Take 1 tablet (5 mg total) by mouth every 4 (four) hours as needed.   pramipexole 0.25 MG tablet Commonly known as:  MIRAPEX TAKE 1 TABLET BY MOUTH 30 MINUTES TO 1 HOUR PRIOR TO BEDTIME.   rizatriptan 10 MG tablet Commonly known as:  MAXALT Take 1 tablet by mouth at migraine onset, may repeat in 2 hours if headache persists. Do not exceed 2 tablets in 24 hours.      Follow-up Information    Ward, Honor Loh, MD Follow up in 2 week(s).   Specialty:  Obstetrics and Gynecology Contact information: [place] [address] [phone]           Signed:  [place] Attending [facility]

## 2017-03-26 LAB — CYTOLOGY - NON PAP

## 2017-03-26 LAB — SURGICAL PATHOLOGY

## 2017-03-30 ENCOUNTER — Other Ambulatory Visit: Payer: 59

## 2017-04-05 ENCOUNTER — Encounter: Payer: 59 | Admitting: Primary Care

## 2017-04-10 DIAGNOSIS — Z9889 Other specified postprocedural states: Secondary | ICD-10-CM | POA: Diagnosis not present

## 2017-04-10 DIAGNOSIS — N83519 Torsion of ovary and ovarian pedicle, unspecified side: Secondary | ICD-10-CM | POA: Diagnosis not present

## 2017-04-16 ENCOUNTER — Other Ambulatory Visit: Payer: Self-pay | Admitting: Primary Care

## 2017-04-24 ENCOUNTER — Encounter: Payer: Self-pay | Admitting: Orthopedic Surgery

## 2017-04-27 ENCOUNTER — Other Ambulatory Visit: Payer: Self-pay | Admitting: *Deleted

## 2017-04-27 ENCOUNTER — Other Ambulatory Visit (INDEPENDENT_AMBULATORY_CARE_PROVIDER_SITE_OTHER): Payer: 59

## 2017-04-27 DIAGNOSIS — Z0000 Encounter for general adult medical examination without abnormal findings: Secondary | ICD-10-CM | POA: Diagnosis not present

## 2017-04-27 DIAGNOSIS — R7303 Prediabetes: Secondary | ICD-10-CM

## 2017-04-27 LAB — LIPID PANEL
CHOL/HDL RATIO: 4
Cholesterol: 164 mg/dL (ref 0–200)
HDL: 46.7 mg/dL (ref >39.00)
LDL Cholesterol: 87 mg/dL (ref 0–99)
NONHDL: 117.34
Triglycerides: 151 mg/dL — ABNORMAL HIGH (ref 0.0–149.0)
VLDL: 30.2 mg/dL (ref 0.0–40.0)

## 2017-04-27 LAB — HEMOGLOBIN A1C: HEMOGLOBIN A1C: 5.8 % (ref 4.6–6.5)

## 2017-04-27 MED ORDER — DICLOFENAC SODIUM 2 % TD SOLN: TRANSDERMAL | 0 refills | Status: DC

## 2017-05-04 ENCOUNTER — Ambulatory Visit (INDEPENDENT_AMBULATORY_CARE_PROVIDER_SITE_OTHER): Payer: 59 | Admitting: Primary Care

## 2017-05-04 ENCOUNTER — Encounter: Payer: Self-pay | Admitting: Primary Care

## 2017-05-04 VITALS — BP 122/76 | HR 62 | Temp 97.9°F | Ht 62.0 in | Wt 239.8 lb

## 2017-05-04 DIAGNOSIS — R05 Cough: Secondary | ICD-10-CM

## 2017-05-04 DIAGNOSIS — R7303 Prediabetes: Secondary | ICD-10-CM | POA: Diagnosis not present

## 2017-05-04 DIAGNOSIS — Z0000 Encounter for general adult medical examination without abnormal findings: Secondary | ICD-10-CM

## 2017-05-04 DIAGNOSIS — R059 Cough, unspecified: Secondary | ICD-10-CM

## 2017-05-04 DIAGNOSIS — N83519 Torsion of ovary and ovarian pedicle, unspecified side: Secondary | ICD-10-CM | POA: Diagnosis not present

## 2017-05-04 DIAGNOSIS — Z1211 Encounter for screening for malignant neoplasm of colon: Secondary | ICD-10-CM | POA: Diagnosis not present

## 2017-05-04 MED ORDER — HYDROCODONE-HOMATROPINE 5-1.5 MG/5ML PO SYRP: 5.0000 mL | ORAL_SOLUTION | Freq: Every evening | ORAL | 0 refills | Status: DC | PRN

## 2017-05-04 NOTE — Patient Instructions (Addendum)
Continue exercising. You should be getting 150 minutes of moderate intensity exercise weekly.  It's important to improve your diet by reducing consumption of fast food, fried food, processed snack foods, sugary drinks. Increase consumption of fresh vegetables and fruits, whole grains, water.  Ensure you are drinking 64 ounces of water daily.  You may take the Hycodan cough suppressant at bedtime as needed for cough and rest. Caution this medication contains codeine and will make you feel drowsy.  You will be contacted regarding your referral to GI for the colonoscopy.  Please let us know if you have not heard back within one week.   Follow up in 1 year for your annual exam or sooner if needed.   It was a pleasure to see you today!

## 2017-05-04 NOTE — Assessment & Plan Note (Signed)
Discussed the importance of a healthy diet and regular exercise in order for weight loss, and to reduce the risk of other medical problems.  

## 2017-05-04 NOTE — Assessment & Plan Note (Signed)
A1C slightly increased to 5.8. Encouraged weight loss through healthy diet and exercise. Continue to monitor.

## 2017-05-04 NOTE — Assessment & Plan Note (Signed)
Underwent surgery in early June, doing well. Following with GYN.

## 2017-05-04 NOTE — Assessment & Plan Note (Signed)
Immunizations UTD. Mammogram UTD, due in December 2018. Colonoscopy due, pending. Discussed the importance of a healthy diet and regular exercise in order for weight loss, and to reduce the risk of other medical problems. Exam unremarkable. Labs stable. Follow up in 1 year for annual exam.

## 2017-05-04 NOTE — Progress Notes (Signed)
Subjective:    Patient ID: Amy Mooney, female    DOB: [birth year], 51 y.o.   MRN: [id number]  HPI  Amy Mooney is a 51 year old female who presents today for complete physical.  Immunizations: -Tetanus: Completed in 2017 -Influenza: Did completed last season    Diet: She endorses a fair diet. Breakfast: Protein shake, eggs Lunch: Sandwich, restaurant food from drug reps. Dinner: Scientist, research (physical sciences), vegetable, starch Snacks: Nuts, rice cakes Desserts: 2-3 times weekly Beverages: Water, sweet tea  Exercise: She has started swimming Eye exam: Completed in January 2018 Dental exam: Completes semi-annually Colonoscopy: Due. Pap Smear: Hysterectomy  Mammogram: Completed in December 2017, normal   Review of Systems  Constitutional: Negative for unexpected weight change.  HENT: Positive for voice change. Negative for postnasal drip and rhinorrhea.   Respiratory: Positive for cough. Negative for shortness of breath.        Cough worse at night. Started Tuesday this week. Overall feels well.   Cardiovascular: Negative for chest pain.  Gastrointestinal: Negative for constipation and diarrhea.  Genitourinary: Negative for difficulty urinating and menstrual problem.  Musculoskeletal: Negative for arthralgias and myalgias.  Skin: Negative for rash.  Allergic/Immunologic: Negative for environmental allergies.  Neurological: Negative for dizziness, numbness and headaches.  Psychiatric/Behavioral:       Denies concerns for anxiety or depression       Past Medical History:  Diagnosis Date  . Chronic knee pain   . Migraines   . Morbid obesity with BMI of 40.0-44.9, adult ([place])   . 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 Oral herpes   . Restless leg      Social History   Social History  . Marital status: Married    Spouse name: [name]  . Number of children: 2  . Years of education: College   Occupational History  .  [place]   Social History Main Topics  . Smoking  status: Never Smoker  . Smokeless tobacco: Never Used  . Alcohol use No  . Drug use: No  . Sexual activity: Not on file   Other Topics Concern  . Not on file   Social History Narrative   [name])   Patient has two adult children.   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   Patient has an college education.   Patient is right-handed.   Patient drinks very little caffeine.   Enjoys spending time with family.     Past Surgical History:  Procedure Laterality Date  . ABDOMINAL HYSTERECTOMY  2010  . artroscopic meniscus repair    . LAPAROSCOPIC SALPINGO OOPHERECTOMY Right 03/22/2017   Procedure: LAPAROSCOPIC SALPINGO OOPHORECTOMY;  Surgeon: Ward, Honor Loh, MD;  Location: [facility] ORS;  Service: Gynecology;  Laterality: Right;    Family History  Problem Relation Age of Onset  . Breast cancer Mother 44  . Diabetes Mother   . Breast cancer Paternal Grandmother     Allergies  Allergen Reactions  . Nuvigil [Armodafinil] Hives  . Penicillins Rash    Has patient had a PCN reaction causing immediate rash, facial/tongue/throat swelling, SOB or lightheadedness with hypotension: Unknown Has patient had a PCN reaction causing severe rash involving mucus membranes or skin necrosis: Unknown Has patient had a PCN reaction that required hospitalization: Unknown Has patient had a PCN reaction occurring within the last 10 years: Unknown If all of the above answers are "NO", then may proceed with Cephalosporin use.  Current Outpatient Prescriptions on File Prior to Visit  Medication Sig Dispense Refill  . diclofenac (CATAFLAM) 50 MG tablet Take 1 tablet (50 mg total) by mouth 2 (two) times daily. 60 tablet 2  . Diclofenac Sodium (PENNSAID) 2 % SOLN Pennsaid 20 mg/gram/actuation (2 %) topical soln in metered-dose pump  apply 2 PUMPS twice daily 112 g 0  . pramipexole (MIRAPEX) 0.25 MG tablet TAKE 1 TABLET BY MOUTH 30 MINUTES TO 1 HOUR PRIOR TO  BEDTIME. 90 tablet 1  . rizatriptan (MAXALT) 10 MG tablet Take 1 tablet by mouth at migraine onset, may repeat in 2 hours if headache persists. Do not exceed 2 tablets in 24 hours. 10 tablet 5   No current facility-administered medications on file prior to visit.     BP 122/76   Pulse 62   Temp 97.9 F (36.6 C) (Oral)   Ht 5\' 2"  (1.575 m)   Wt 239 lb 12.8 oz (108.8 kg)   SpO2 97%   BMI 43.86 kg/m    Objective:   Physical Exam  Constitutional: She is oriented to person, place, and time. She appears well-nourished.  HENT:  Right Ear: Tympanic membrane and ear canal normal.  Left Ear: Tympanic membrane and ear canal normal.  Nose: Nose normal.  Mouth/Throat: Oropharynx is clear and moist.  Hoarse voice   Eyes: Pupils are equal, round, and reactive to light. Conjunctivae and EOM are normal.  Neck: Neck supple. No thyromegaly present.  Cardiovascular: Normal rate and regular rhythm.   No murmur heard. Pulmonary/Chest: Effort normal and breath sounds normal. She has no rales.  Abdominal: Soft. Bowel sounds are normal. There is no tenderness.  Musculoskeletal: Normal range of motion.  Lymphadenopathy:    She has no cervical adenopathy.  Neurological: She is alert and oriented to person, place, and time. She has normal reflexes. No cranial nerve deficit.  Skin: Skin is warm and dry. No rash noted.  Psychiatric: She has a normal mood and affect.          Assessment & Plan:  URI:  Cough and hoarse voice x 3 days. Overall feeling well. Cough worse at night. Exam today with clear lungs, no bacterial involvement. Could be allergies.  Rx for Hycodan provided to use HS PRN. She will update if symptoms persist over 7 days.  Sheral Flow, NP

## 2017-05-22 ENCOUNTER — Other Ambulatory Visit: Payer: Self-pay

## 2017-05-22 DIAGNOSIS — Z1211 Encounter for screening for malignant neoplasm of colon: Secondary | ICD-10-CM

## 2017-05-22 MED ORDER — NA SULFATE-K SULFATE-MG SULF 17.5-3.13-1.6 GM/177ML PO SOLN: 1.0000 | ORAL | 0 refills | Status: DC

## 2017-06-01 ENCOUNTER — Other Ambulatory Visit: Payer: Self-pay | Admitting: Primary Care

## 2017-06-01 DIAGNOSIS — G2581 Restless legs syndrome: Secondary | ICD-10-CM

## 2017-06-01 DIAGNOSIS — G43001 Migraine without aura, not intractable, with status migrainosus: Secondary | ICD-10-CM

## 2017-06-01 NOTE — Telephone Encounter (Signed)
Noted, Rx's sent to pharmacy 

## 2017-06-01 NOTE — Telephone Encounter (Signed)
Ok to refill? Electronically refill request for  rizatriptan (MAXALT) 10 MG tablet Last prescribed on 03/17/2016   pramipexole (MIRAPEX) 0.25 MG tablet Last prescribed on 12/04/2016  Last seen for physical on 05/04/2017

## 2017-06-19 ENCOUNTER — Ambulatory Visit: Payer: 59 | Admitting: Anesthesiology

## 2017-06-19 ENCOUNTER — Encounter
Admission: RE | Disposition: A | Discharge status: Home / Self Care | Payer: Self-pay | Source: Ambulatory Visit | Attending: Gastroenterology

## 2017-06-19 ENCOUNTER — Ambulatory Visit
Admission: RE | Admit: 2017-06-19 | Discharge: 2017-06-19 | Disposition: A | Discharge status: Home / Self Care | Payer: 59 | Source: Ambulatory Visit | Attending: Gastroenterology | Admitting: Gastroenterology

## 2017-06-19 DIAGNOSIS — K635 Polyp of colon: Secondary | ICD-10-CM | POA: Diagnosis not present

## 2017-06-19 DIAGNOSIS — G2581 Restless legs syndrome: Secondary | ICD-10-CM | POA: Insufficient documentation

## 2017-06-19 DIAGNOSIS — G8929 Other chronic pain: Secondary | ICD-10-CM | POA: Diagnosis not present

## 2017-06-19 DIAGNOSIS — Z79899 Other long term (current) drug therapy: Secondary | ICD-10-CM | POA: Diagnosis not present

## 2017-06-19 DIAGNOSIS — D125 Benign neoplasm of sigmoid colon: Secondary | ICD-10-CM | POA: Diagnosis not present

## 2017-06-19 DIAGNOSIS — Z6841 Body Mass Index (BMI) 40.0 and over, adult: Secondary | ICD-10-CM | POA: Insufficient documentation

## 2017-06-19 DIAGNOSIS — G47419 Narcolepsy without cataplexy: Secondary | ICD-10-CM | POA: Insufficient documentation

## 2017-06-19 DIAGNOSIS — Z87442 Personal history of urinary calculi: Secondary | ICD-10-CM | POA: Insufficient documentation

## 2017-06-19 DIAGNOSIS — N189 Chronic kidney disease, unspecified: Secondary | ICD-10-CM | POA: Insufficient documentation

## 2017-06-19 DIAGNOSIS — Z90710 Acquired absence of both cervix and uterus: Secondary | ICD-10-CM | POA: Insufficient documentation

## 2017-06-19 DIAGNOSIS — Z1211 Encounter for screening for malignant neoplasm of colon: Secondary | ICD-10-CM | POA: Insufficient documentation

## 2017-06-19 DIAGNOSIS — M25569 Pain in unspecified knee: Secondary | ICD-10-CM | POA: Diagnosis not present

## 2017-06-19 DIAGNOSIS — K5730 Diverticulosis of large intestine without perforation or abscess without bleeding: Secondary | ICD-10-CM | POA: Insufficient documentation

## 2017-06-19 HISTORY — PX: COLONOSCOPY WITH PROPOFOL: SHX5780

## 2017-06-19 SURGERY — COLONOSCOPY WITH PROPOFOL
Anesthesia: General

## 2017-06-19 MED ORDER — LIDOCAINE 2% (20 MG/ML) 5 ML SYRINGE
INTRAMUSCULAR | Status: DC | PRN
  Administered 2017-06-19: 40 mg via INTRAVENOUS

## 2017-06-19 MED ORDER — PROPOFOL 10 MG/ML IV BOLUS
INTRAVENOUS | Status: DC | PRN
  Administered 2017-06-19: 100 mg via INTRAVENOUS

## 2017-06-19 MED ORDER — SODIUM CHLORIDE 0.9 % IV SOLN
INTRAVENOUS | Status: DC
Start: 2017-06-19 — End: 2017-06-19
  Administered 2017-06-19 (×2): via INTRAVENOUS

## 2017-06-19 MED ORDER — PROPOFOL 500 MG/50ML IV EMUL
INTRAVENOUS | Status: DC | PRN
  Administered 2017-06-19: 200 ug/kg/min via INTRAVENOUS

## 2017-06-19 NOTE — H&P (Signed)
Amy Lame, MD [facility] [address], [place], [place] [address] Phone: [phone] Fax : [phone]  Primary Care Physician:  Amy Koch, NP Primary Gastroenterologist:  Dr. Allen Mooney  Pre-Procedure History & Physical: HPI:  Amy Mooney is a 51 y.o. female is here for a screening colonoscopy.   Past Medical History:  Diagnosis Date  . Chronic kidney disease    kidney stones  . Chronic knee pain   . Migraines   . Morbid obesity with BMI of 40.0-44.9, adult ([place])   . 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 Oral herpes   . Restless leg     Past Surgical History:  Procedure Laterality Date  . ABDOMINAL HYSTERECTOMY  2010  . artroscopic meniscus repair    . LAPAROSCOPIC SALPINGO OOPHERECTOMY Right 03/22/2017   Procedure: LAPAROSCOPIC SALPINGO OOPHORECTOMY;  Surgeon: Ward, Honor Loh, MD;  Location: [facility] ORS;  Service: Gynecology;  Laterality: Right;    Prior to Admission medications   Medication Sig Start Date End Date Taking? Authorizing Provider  Na Sulfate-K Sulfate-Mg Sulf (SUPREP BOWEL PREP KIT) 17.5-3.13-1.6 GM/180ML SOLN Take 1 kit by mouth as directed. 05/22/17  Yes Amy Lame, MD  rizatriptan (MAXALT) 10 MG tablet TAKE 1 TABLET BY MOUTH AT MIGRAINE ONSET MAY REPEAT IN 2 HOURS IF HEADACHE PERSISTS.NOT EXCEED 2 TABLETS IN 24HRS 06/01/17  Yes Amy Koch, NP  diclofenac (CATAFLAM) 50 MG tablet Take 1 tablet (50 mg total) by mouth 2 (two) times daily. 02/09/17   Carole Civil, MD  Diclofenac Sodium (PENNSAID) 2 % SOLN Pennsaid 20 mg/gram/actuation (2 %) topical soln in metered-dose pump  apply 2 PUMPS twice daily 04/27/17   Carole Civil, MD  HYDROcodone-homatropine [facility]) 5-1.5 MG/5ML syrup Take 5 mLs by mouth at bedtime as needed. Patient not taking: Reported on 06/19/2017 05/04/17   Amy Koch, NP  pramipexole (MIRAPEX) 0.25 MG tablet TAKE 1 TABLET BY MOUTH 30 MINUTES TO 1 HOUR PRIOR TO BEDTIME 06/01/17   Amy Koch, NP     Allergies as of 05/22/2017 - Review Complete 05/04/2017  Allergen Reaction Noted  . Nuvigil [armodafinil] Hives 02/21/2013  . Penicillins Rash 02/21/2013    Family History  Problem Relation Age of Onset  . Breast cancer Mother 51  . Diabetes Mother   . Breast cancer Paternal Grandmother     Social History   Social History  . Marital status: Married    Spouse name: Amy Mooney  . Number of children: 2  . Years of education: College   Occupational History  .  Commodore   Social History Main Topics  . Smoking status: Never Smoker  . Smokeless tobacco: Never Used  . Alcohol use No  . Drug use: No  . Sexual activity: Not on file   Other Topics Concern  . Not on file   Social History Narrative   Patient lives at home with her husband Amy Mooney)   Patient has two adult children.   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   Patient has an college education.   Patient is right-handed.   Patient drinks very little caffeine.   Enjoys spending time with family.     Review of Systems: See HPI, otherwise negative ROS  Physical Exam: BP (!) 144/75   Pulse 77   Temp 99 F (37.2 C) (Tympanic)   Resp 20   Ht 5' 3"  (1.6 m)   Wt 230 lb (104.3 kg)   SpO2 99%  BMI 40.74 kg/m  General:   Alert,  pleasant and cooperative in NAD Head:  Normocephalic and atraumatic. Neck:  Supple; no masses or thyromegaly. Lungs:  Clear throughout to auscultation.    Heart:  Regular rate and rhythm. Abdomen:  Soft, nontender and nondistended. Normal bowel sounds, without guarding, and without rebound.   Neurologic:  Alert and  oriented x4;  grossly normal neurologically.  Impression/Plan: Amy Mooney is now here to undergo a screening colonoscopy.  Risks, benefits, and alternatives regarding colonoscopy have been reviewed with the patient.  Questions have been answered.  All parties agreeable.

## 2017-06-19 NOTE — Op Note (Signed)
[facility] Gastroenterology Patient Name: Amy Mooney Procedure Date: 06/19/2017 9:52 AM MRN: [id number] Account #: [id number] Date of Birth: 1966/08/03 Admit Type: Outpatient Age: 51 Room: [facility] ENDO ROOM 4 Gender: Female Note Status: Finalized Procedure:            Colonoscopy Indications:          Screening for colorectal malignant neoplasm Providers:            [name] MD, MD Referring MD:         [name] (Referring MD) Medicines:            Propofol per Anesthesia Complications:        No immediate complications. Procedure:            Pre-Anesthesia Assessment:                       - Prior to the procedure, a History and Physical was                        performed, and patient medications and allergies were                        reviewed. The patient's tolerance of previous                        anesthesia was also reviewed. The risks and benefits of                        the procedure and the sedation options and risks were                        discussed with the patient. All questions were                        answered, and informed consent was obtained. Prior                        Anticoagulants: The patient has taken no previous                        anticoagulant or antiplatelet agents. ASA Grade                        Assessment: II - A patient with mild systemic disease.                        After reviewing the risks and benefits, the patient was                        deemed in satisfactory condition to undergo the                        procedure.                       After obtaining informed consent, the colonoscope was                        passed under direct vision. Throughout the procedure,  the patient's blood pressure, pulse, and oxygen                        saturations were monitored continuously. The                        Colonoscope was introduced through the anus and                         advanced to the the cecum, identified by appendiceal                        orifice and ileocecal valve. The colonoscopy was                        performed without difficulty. The patient tolerated the                        procedure well. The quality of the bowel preparation                        was excellent. Findings:      The perianal and digital rectal examinations were normal.      Three sessile polyps were found in the sigmoid colon. The polyps were 2       to 3 mm in size. These polyps were removed with a cold biopsy forceps.       Resection and retrieval were complete.      A few small-mouthed diverticula were found in the entire colon. Impression:           - Three 2 to 3 mm polyps in the sigmoid colon, removed                        with a cold biopsy forceps. Resected and retrieved.                       - Diverticulosis in the entire examined colon. Recommendation:       - Discharge patient to home.                       - Resume previous diet.                       - Continue present medications.                       - Await pathology results.                       - Repeat colonoscopy in 5 years if polyp adenoma and 10                        years if hyperplastic Procedure Code(s):    --- Professional ---                       [phone], Colonoscopy, flexible; with biopsy, single or                        multiple Diagnosis Code(s):    --- Professional ---  Z12.11, Encounter for screening for malignant neoplasm                        of colon                       D12.5, Benign neoplasm of sigmoid colon CPT copyright 2016 American Medical Association. All rights reserved. The codes documented in this report are preliminary and upon coder review may  be revised to meet current compliance requirements. [name] MD, MD 06/19/2017 10:11:40 AM This report has been signed electronically. Number of Addenda: 0 Note Initiated On: 06/19/2017 9:52  AM Scope Withdrawal Time: 0 hours 7 minutes 58 seconds  Total Procedure Duration: 0 hours 12 minutes 53 seconds       [facility]

## 2017-06-19 NOTE — Transfer of Care (Signed)
Immediate Anesthesia Transfer of Care Note  Patient: Amy Mooney  Procedure(s) Performed: Procedure(s): COLONOSCOPY WITH PROPOFOL (N/A)  Patient Location: PACU and Endoscopy Unit  Anesthesia Type:General  Level of Consciousness: drowsy  Airway & Oxygen Therapy: Patient Spontanous Breathing and Patient connected to nasal cannula oxygen  Post-op Assessment: Report given to RN and Post -op Vital signs reviewed and stable  Post vital signs: Reviewed and stable  Last Vitals:  Vitals:   06/19/17 0929  BP: (!) 144/75  Pulse: 77  Resp: 20  Temp: 37.2 C  SpO2: 99%    Last Pain:  Vitals:   06/19/17 0929  TempSrc: Tympanic         Complications: No apparent anesthesia complications

## 2017-06-19 NOTE — Anesthesia Post-op Follow-up Note (Signed)
Anesthesia QCDR form completed.        

## 2017-06-19 NOTE — Anesthesia Preprocedure Evaluation (Signed)
Anesthesia Evaluation  Patient identified by MRN, date of birth, ID band Patient awake    Reviewed: Allergy & Precautions, H&P , NPO status , Patient's Chart, lab work & pertinent test results, reviewed documented beta blocker date and time   History of Anesthesia Complications Negative for: history of anesthetic complications  Airway Mallampati: I  TM Distance: >3 FB Neck ROM: full    Dental  (+) Dental Advidsory Given, Missing   Pulmonary neg pulmonary ROS,           Cardiovascular Exercise Tolerance: Good negative cardio ROS       Neuro/Psych  Headaches, neg Seizures negative psych ROS   GI/Hepatic negative GI ROS, Neg liver ROS,   Endo/Other  neg diabetesMorbid obesity  Renal/GU Renal disease (kidney stones)  negative genitourinary   Musculoskeletal   Abdominal   Peds  Hematology negative hematology ROS (+)   Anesthesia Other Findings Past Medical History: No date: Chronic kidney disease     Comment:  kidney stones No date: Chronic knee pain No date: Migraines No date: Morbid obesity with BMI of 40.0-44.9, adult ([facility]) No date: Narcolepsy without cataplexy(347.00) No date: Oral herpes No date: Restless leg   Reproductive/Obstetrics negative OB ROS                             Anesthesia Physical Anesthesia Plan  ASA: III  Anesthesia Plan: General   Post-op Pain Management:    Induction: Intravenous  PONV Risk Score and Plan: 3 and Propofol infusion  Airway Management Planned: Natural Airway and Nasal Cannula  Additional Equipment:   Intra-op Plan:   Post-operative Plan:   Informed Consent: I have reviewed the patients History and Physical, chart, labs and discussed the procedure including the risks, benefits and alternatives for the proposed anesthesia with the patient or authorized representative who has indicated his/her understanding and acceptance.   Dental  Advisory Given  Plan Discussed with: Anesthesiologist, CRNA and Surgeon  Anesthesia Plan Comments:         Anesthesia Quick Evaluation

## 2017-06-20 LAB — SURGICAL PATHOLOGY

## 2017-06-21 ENCOUNTER — Encounter: Payer: Self-pay | Admitting: Gastroenterology

## 2017-06-21 NOTE — Anesthesia Postprocedure Evaluation (Signed)
Anesthesia Post Note  Patient: Amy Mooney  Procedure(s) Performed: Procedure(s) (LRB): COLONOSCOPY WITH PROPOFOL (N/A)  Patient location during evaluation: Endoscopy Anesthesia Type: General Level of consciousness: awake and alert Pain management: pain level controlled Vital Signs Assessment: post-procedure vital signs reviewed and stable Respiratory status: spontaneous breathing, nonlabored ventilation, respiratory function stable and patient connected to nasal cannula oxygen Cardiovascular status: blood pressure returned to baseline and stable Postop Assessment: no signs of nausea or vomiting Anesthetic complications: no     Last Vitals:  Vitals:   06/19/17 1046 06/19/17 1056  BP: 132/80   Pulse: 61 61  Resp: 18 17  Temp:    SpO2: 99% 100%    Last Pain:  Vitals:   06/20/17 0756  TempSrc:   PainSc: 0-No pain                 Martha Clan

## 2017-08-10 ENCOUNTER — Encounter: Payer: Self-pay | Admitting: Orthopedic Surgery

## 2017-08-10 ENCOUNTER — Ambulatory Visit (INDEPENDENT_AMBULATORY_CARE_PROVIDER_SITE_OTHER): Payer: 59 | Admitting: Orthopedic Surgery

## 2017-08-10 VITALS — BP 152/99 | HR 66 | Ht 62.0 in | Wt 236.0 lb

## 2017-08-10 DIAGNOSIS — M170 Bilateral primary osteoarthritis of knee: Secondary | ICD-10-CM

## 2017-08-10 MED ORDER — DICLOFENAC POTASSIUM 50 MG PO TABS: 50.0000 mg | ORAL_TABLET | Freq: Two times a day (BID) | ORAL | 2 refills | Status: DC

## 2017-08-10 NOTE — Progress Notes (Signed)
Progress Note   Patient ID: [name], female   DOB: [birth year], 52 y.o.   MRN: [id number]  Chief Complaint  Patient presents with  . Follow-up    6 month recheck on bilateral knees.    On the patient's 9 years all she has bilateral knee arthritis she is managed with Pennsaid and diclofenac as Cataflam. Her son died a month or 2 ago when she stopped taking her medicine. However, now that she is back on her medication her knees are starting to feel good again.     Review of Systems  Psychiatric/Behavioral: Positive for depression.   No outpatient prescriptions have been marked as taking for the 08/10/17 encounter (Office Visit) with Carole Civil, MD.    Allergies  Allergen Reactions  . Nuvigil [Armodafinil] Hives  . Penicillins Rash    Has patient had a PCN reaction causing immediate rash, facial/tongue/throat swelling, SOB or lightheadedness with hypotension: Unknown Has patient had a PCN reaction causing severe rash involving mucus membranes or skin necrosis: Unknown Has patient had a PCN reaction that required hospitalization: Unknown Has patient had a PCN reaction occurring within the last 10 years: Unknown If all of the above answers are "NO", then may proceed with Cephalosporin use.      BP (!) 152/99   Pulse 66   Ht 5\' 2"  (1.575 m)   Wt 236 lb (107 kg)   BMI 43.16 kg/m   Physical Exam   Gen. appearance the patient's appearance is normal with normal grooming and  hygiene The patient is oriented to person place and time Mood Depressed affect flat  BP (!) 152/99   Pulse 66   Ht 5\' 2"  (1.575 m)   Wt 236 lb (107 kg)   BMI 43.16 kg/m  Ortho Exam  Right knee full range of motion left knee full range of motion   Ambulation is without assistive device    Medical decision-making Encounter Diagnosis  Name Primary?  . Primary osteoarthritis of both knees Yes      Meds ordered this encounter  Medications  . diclofenac (CATAFLAM) 50 MG tablet     Sig: Take 1 tablet (50 mg total) by mouth 2 (two) times daily.    Dispense:  180 tablet    Refill:  2   6 month follow-up x-rays needed  Arther Abbott, MD 08/10/2017 10:08 AM

## 2017-12-14 DIAGNOSIS — H524 Presbyopia: Secondary | ICD-10-CM | POA: Diagnosis not present

## 2017-12-14 DIAGNOSIS — H5213 Myopia, bilateral: Secondary | ICD-10-CM | POA: Diagnosis not present

## 2017-12-24 ENCOUNTER — Other Ambulatory Visit: Payer: Self-pay | Admitting: Primary Care

## 2017-12-24 DIAGNOSIS — G2581 Restless legs syndrome: Secondary | ICD-10-CM

## 2018-02-22 ENCOUNTER — Ambulatory Visit: Payer: 59 | Admitting: Orthopedic Surgery

## 2018-03-02 IMAGING — US US PELVIS COMPLETE
1 series · 13 of 25 positions shown · non-contrast
Comparison: CT scan from earlier today.

CLINICAL DATA: Worsening pelvic pain since this morning. Patient is
status post hysterectomy and unilateral oophorectomy although
patient is unsure which ovary was removed.

EXAM:
TRANSABDOMINAL AND TRANSVAGINAL ULTRASOUND OF PELVIS
DOPPLER ULTRASOUND OF OVARIES
TECHNIQUE: Both transabdominal and transvaginal ultrasound examinations of the
pelvis were performed. Transabdominal technique was performed for
global imaging of the pelvis including uterus, ovaries, adnexal
regions, and pelvic cul-de-sac.
It was necessary to proceed with endovaginal exam following the
transabdominal exam to visualize the right ovary. Color and duplex
Doppler ultrasound was utilized to evaluate blood flow to the
ovaries.

[Series 1: us pelvis complete · 0.31mm/px · 100 acquisitions, 13 frames shown]
[im 1/100]
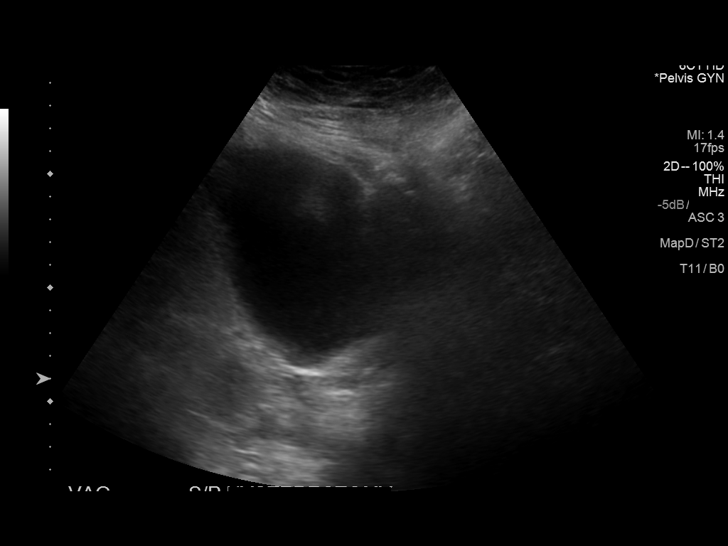
[im 9/100]
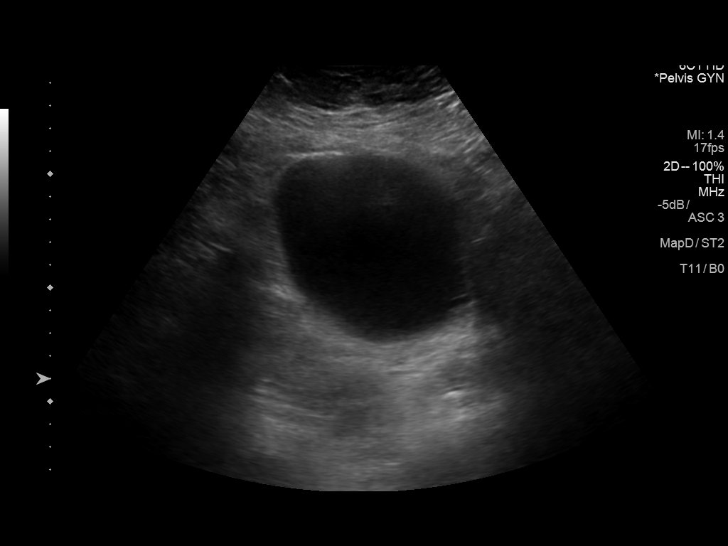
[im 17/100]
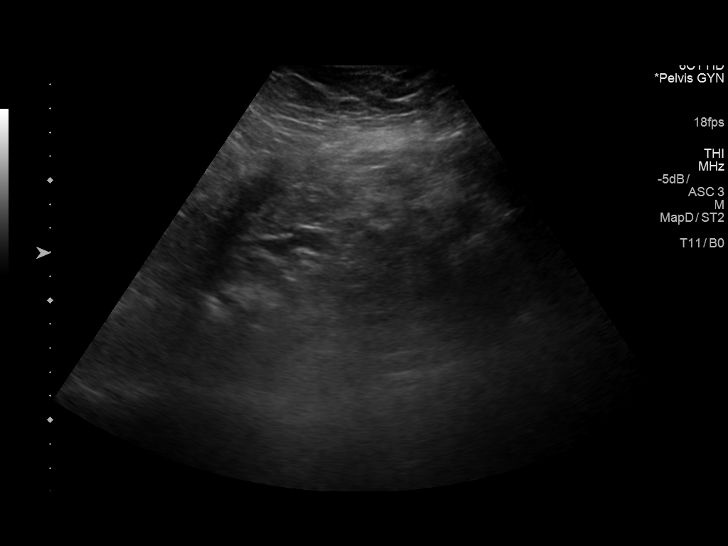
[im 25/100]
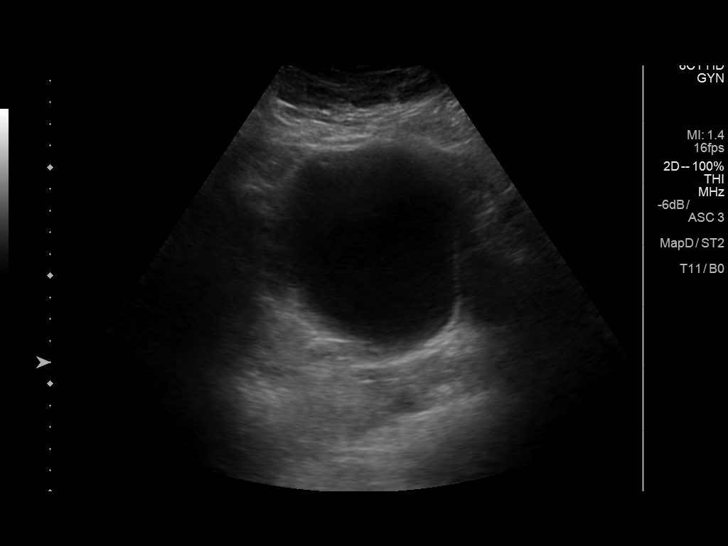
[im 34/100]
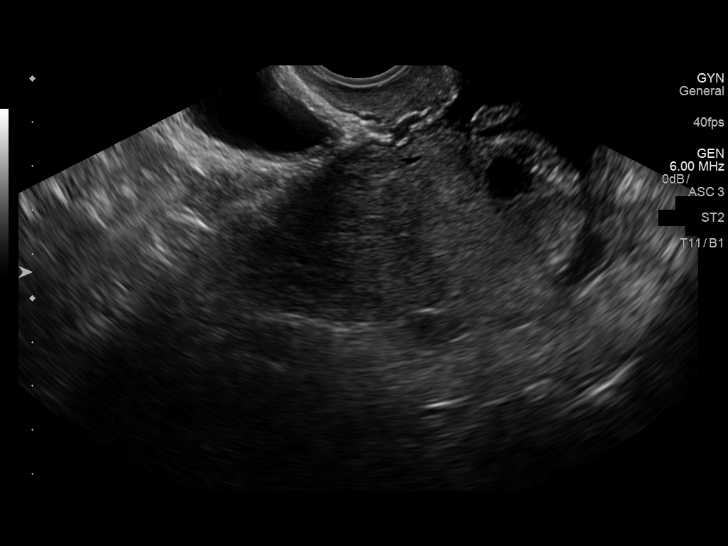
[im 42/100]
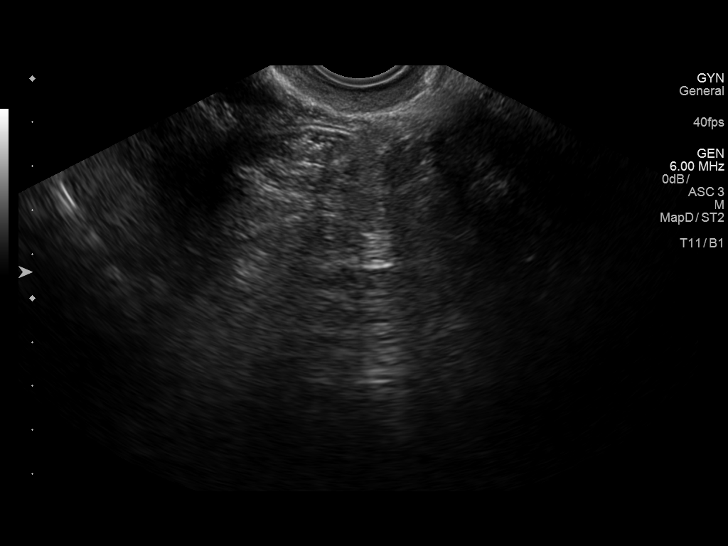
[im 50/100]
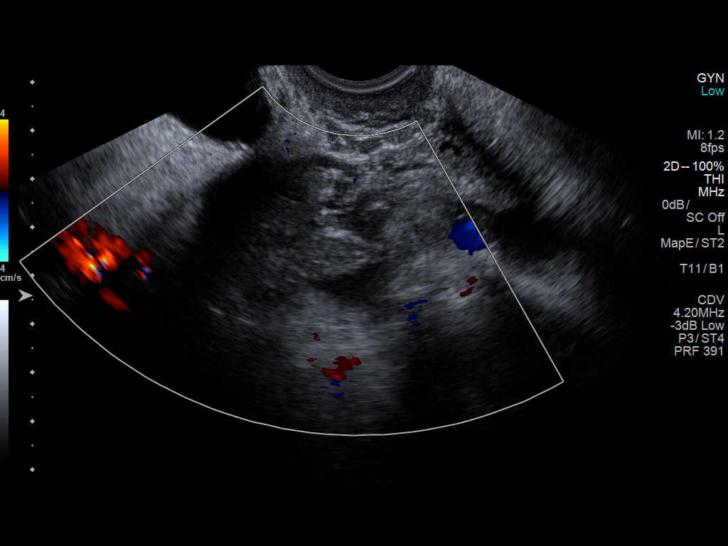
[im 58/100]
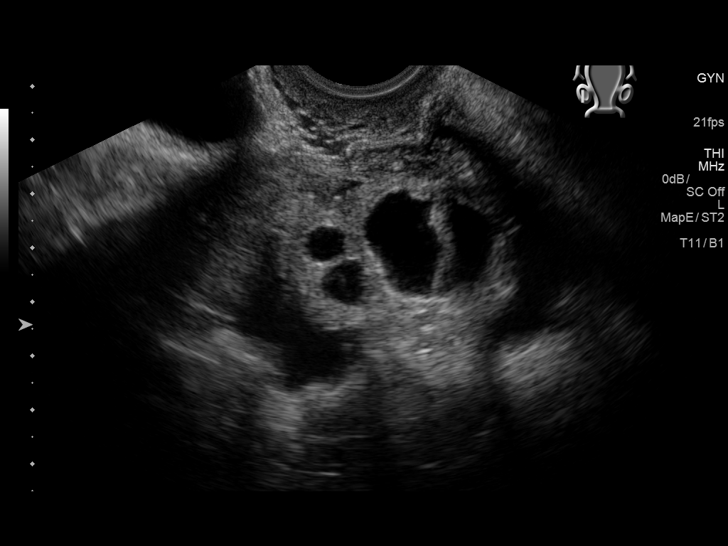
[im 67/100]
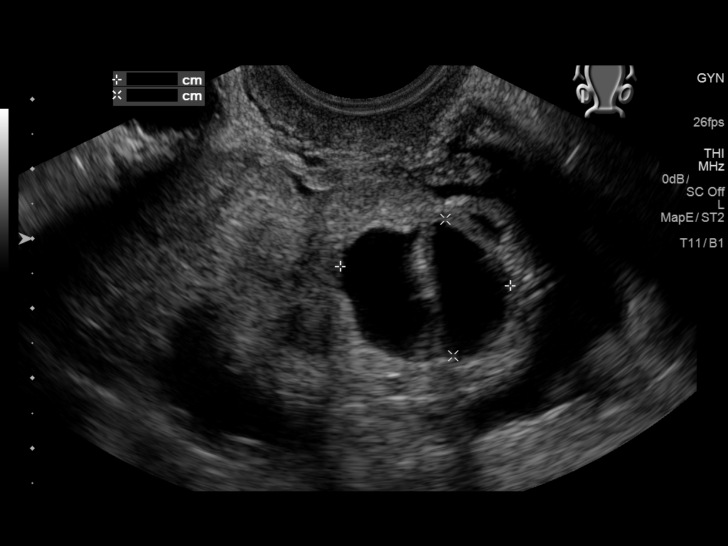
[im 75/100]
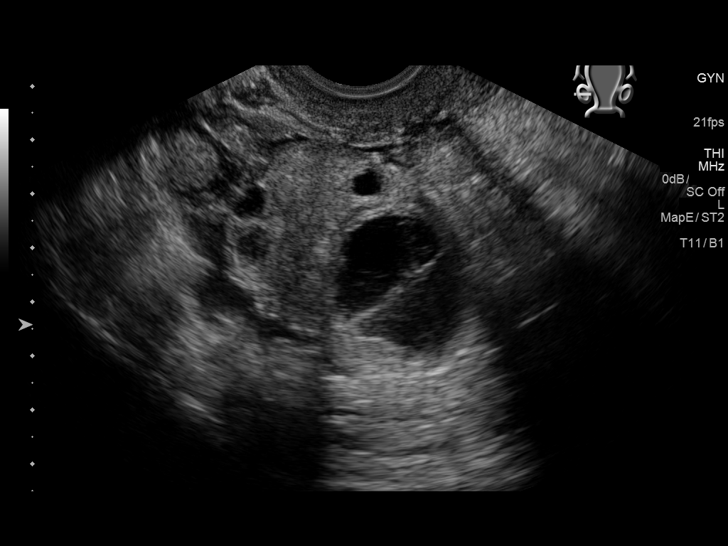
[im 83/100]
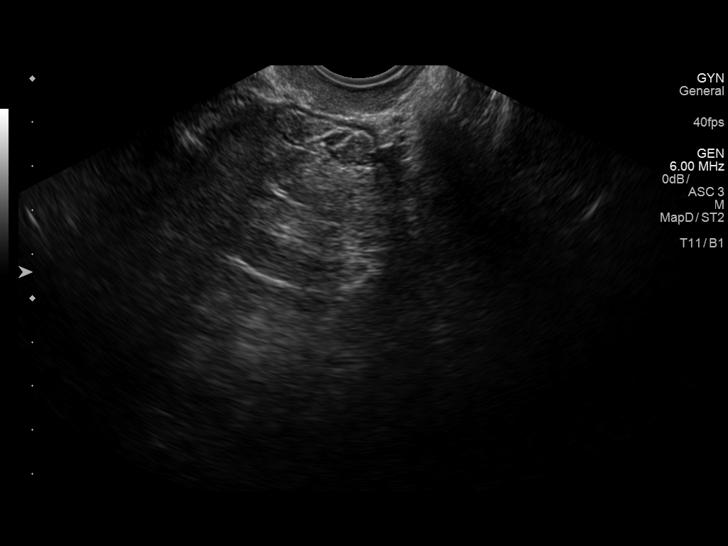
[im 91/100]
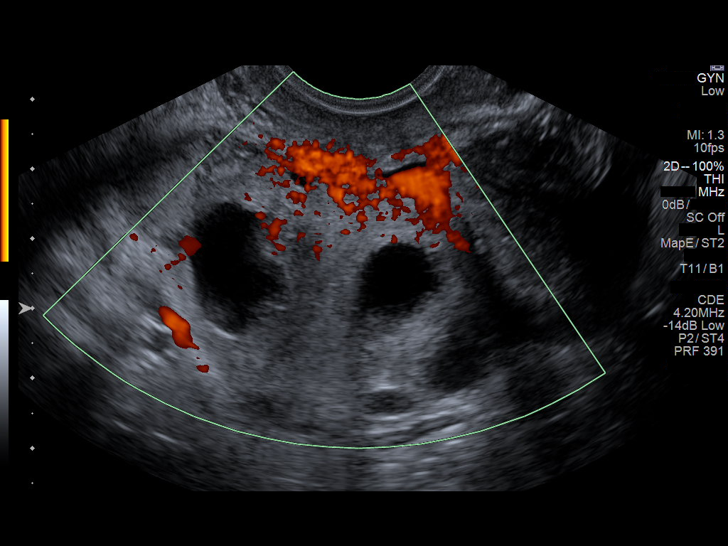
[im 100/100]
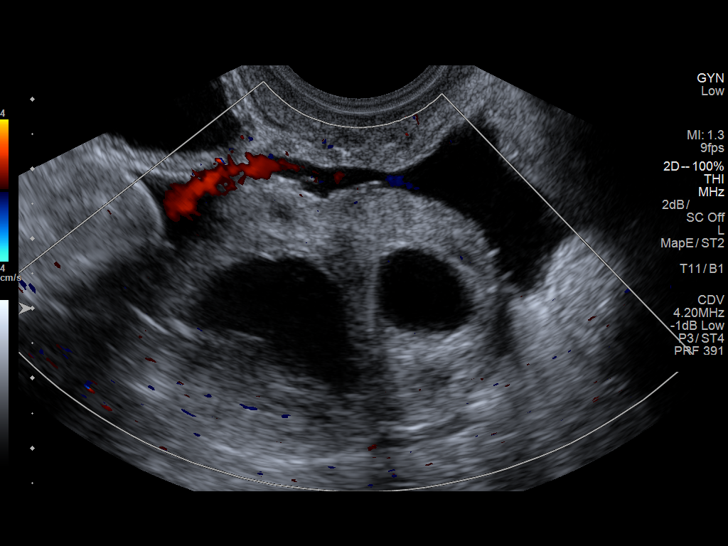

[13 of 25 positions shown; findings below may reference images not displayed]

FINDINGS: Uterus

Measurements: Surgically absent..

Endometrium

Thickness: N/A.

Right ovary

Complex right adnexal mass measures 6.8 x 4.2 x 4.5 cm. This
corresponds to the abnormality seen on the CT scan earlier today.
This structure is complex with multi cystic change in areas of solid
soft tissue. Collar and pulsed Doppler evaluation of the right
adnexal mass shows little to no blood flow can be detectable flow on
pulsed Doppler imaging is at the extreme periphery of the lesion.

Left ovary

Not visualized

Other findings

Small volume of simple appearing free fluid is identified in the
right adnexal space, adjacent to the adnexal mass.
IMPRESSION: 6.8 cm complex cystic and solid lesion in the right adnexal space,
corresponding to the abnormality seen at CT. The course of the
gonadal vasculature on the CT scan is compatible with this being
right ovarian origin. Ultrasound imaging shows no detectable blood
flow within most of the lesion with only a minimal amount of
arterial and venous flow identified along a single margin of the
lesion on pulse Doppler assessment. Ultrasound imaging features
highly suspicious for ovarian torsion.

Small volume simple appearing intraperitoneal free fluid.

## 2018-03-08 ENCOUNTER — Encounter: Payer: Self-pay | Admitting: Orthopedic Surgery

## 2018-03-08 ENCOUNTER — Ambulatory Visit (INDEPENDENT_AMBULATORY_CARE_PROVIDER_SITE_OTHER): Payer: 59

## 2018-03-08 ENCOUNTER — Ambulatory Visit (INDEPENDENT_AMBULATORY_CARE_PROVIDER_SITE_OTHER): Payer: 59 | Admitting: Orthopedic Surgery

## 2018-03-08 VITALS — BP 132/85 | HR 76 | Ht 62.0 in | Wt 240.0 lb

## 2018-03-08 DIAGNOSIS — M170 Bilateral primary osteoarthritis of knee: Secondary | ICD-10-CM

## 2018-03-08 MED ORDER — DICLOFENAC SODIUM 75 MG PO TBEC: 75.0000 mg | DELAYED_RELEASE_TABLET | Freq: Two times a day (BID) | ORAL | 5 refills | Status: DC

## 2018-03-08 NOTE — Progress Notes (Signed)
Progress Note   Patient ID: Amy Mooney, female   DOB: [birth year], 52 y.o.   MRN: [id number] Chief Complaint  Patient presents with  . Follow-up    Recheck on bilateral knees.        Medical decision-making  Imaging:  Xrays ordered: y/n ? yes right and left knee  The x-ray report is been dictated the right knee worse than the left goes along with her symptoms  Encounter Diagnosis  Name Primary?  . Primary osteoarthritis of both knees Yes     PLAN:  Weight loss goal weight to 15 Bilateral knee injections Increase diclofenac to 75 mg Return in 6 months x-ray again   Procedure note left knee injection verbal consent was obtained to inject left knee joint  Timeout was completed to confirm the site of injection  The medications used were 40 mg of Depo-Medrol and 1% lidocaine 3 cc  Anesthesia was provided by ethyl chloride and the skin was prepped with alcohol.  After cleaning the skin with alcohol a 20-gauge needle was used to inject the left knee joint. There were no complications. A sterile bandage was applied.   Procedure note right knee injection verbal consent was obtained to inject right knee joint  Timeout was completed to confirm the site of injection  The medications used were 40 mg of Depo-Medrol and 1% lidocaine 3 cc  Anesthesia was provided by ethyl chloride and the skin was prepped with alcohol.  After cleaning the skin with alcohol a 20-gauge needle was used to inject the right knee joint. There were no complications. A sterile bandage was applied.    Meds ordered this encounter  Medications  . diclofenac (VOLTAREN) 75 MG EC tablet    Sig: Take 1 tablet (75 mg total) by mouth 2 (two) times daily.    Dispense:  60 tablet    Refill:  5      Chief Complaint  Patient presents with  . Follow-up    Recheck on bilateral knees.    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  She says Tuesdays are the worst days as she does surgery by the end of last Tuesday she was in tears  The pain is dull its on the inside of the knee right is worse than left she is had it for several years now she is only 52 years old.  She does not associate this with swelling or major stiffness    Review of Systems  Constitutional: Negative for fever.  Skin: Negative for rash.  Neurological: Negative for tingling.  All other systems reviewed and are negative.  No outpatient medications have been marked as taking for the 03/08/18 encounter (Office Visit) with Carole Civil, MD.    Past Medical History:  Diagnosis Date  . Chronic kidney disease    kidney stones  . Chronic knee pain   . Migraines   . Morbid obesity with BMI of 40.0-44.9, adult ([place])   . 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 Oral herpes   . Restless leg      Allergies  Allergen Reactions  . Nuvigil [Armodafinil] Hives  . Penicillins Rash    Has patient had a PCN reaction causing immediate rash, facial/tongue/throat swelling, SOB or lightheadedness with hypotension: Unknown Has patient  had a PCN reaction causing severe rash involving mucus membranes or skin necrosis: Unknown Has patient had a PCN reaction that required hospitalization: Unknown Has patient had a PCN reaction occurring within the last 10 years: Unknown If all of the above answers are "NO", then may proceed with Cephalosporin use.     BP 132/85   Pulse 76   Ht 5\' 2"  (1.575 m)   Wt 240 lb (108.9 kg)   BMI 43.90 kg/m    Physical Exam  Constitutional: She is oriented to person, place, and time. She appears well-developed and well-nourished.  Moderate obesity  Musculoskeletal:       Right knee: She exhibits no effusion.       Left knee: She exhibits no effusion.   Neurological: She is alert and oriented to person, place, and time.  Psychiatric: She has a normal mood and affect. Judgment normal.  Vitals reviewed.   Right Knee Exam   Muscle Strength  The patient has normal right knee strength.  Tenderness  The patient is experiencing tenderness in the medial joint line.  Range of Motion  Extension: normal  Flexion:  120 normal   Tests  McMurray:  Medial - negative Lateral - negative Varus: negative Valgus: negative Drawer:  Anterior - negative    Posterior - negative  Other  Erythema: absent Scars: absent Sensation: normal Pulse: present Swelling: none Effusion: no effusion present   Left Knee Exam   Muscle Strength  The patient has normal left knee strength.  Tenderness  The patient is experiencing tenderness in the medial joint line.  Range of Motion  Extension: normal  Flexion:  120 normal   Tests  McMurray:  Medial - negative Lateral - negative Varus: negative Valgus: negative Drawer:  Anterior - negative     Posterior - negative  Other  Erythema: absent Scars: absent Sensation: normal Pulse: present Swelling: none Effusion: no effusion present      Arther Abbott, MD  10:44 AM 03/08/2018

## 2018-04-11 ENCOUNTER — Other Ambulatory Visit: Payer: Self-pay | Admitting: Primary Care

## 2018-04-11 DIAGNOSIS — R7303 Prediabetes: Secondary | ICD-10-CM

## 2018-04-11 DIAGNOSIS — Z0000 Encounter for general adult medical examination without abnormal findings: Secondary | ICD-10-CM

## 2018-05-06 ENCOUNTER — Other Ambulatory Visit: Payer: 59

## 2018-05-10 ENCOUNTER — Encounter: Payer: 59 | Admitting: Primary Care

## 2018-05-22 ENCOUNTER — Telehealth: Payer: Self-pay | Admitting: Orthopedic Surgery

## 2018-05-22 NOTE — Telephone Encounter (Signed)
Patient called for appointment.  I did explain that Dr. Aline Brochure is out of the office until the 12th but that I could offer her an appointment on Monday, 05/10/18 in the late afternoon.  She said that wasn't a good date or time.  I then offered several other appointments to which did not work for her schedule.  She opted to just wait until her next scheduled appointment.

## 2018-06-03 ENCOUNTER — Other Ambulatory Visit (INDEPENDENT_AMBULATORY_CARE_PROVIDER_SITE_OTHER): Payer: 59

## 2018-06-03 DIAGNOSIS — Z0000 Encounter for general adult medical examination without abnormal findings: Secondary | ICD-10-CM | POA: Diagnosis not present

## 2018-06-03 DIAGNOSIS — R7303 Prediabetes: Secondary | ICD-10-CM | POA: Diagnosis not present

## 2018-06-03 LAB — COMPREHENSIVE METABOLIC PANEL
ALBUMIN: 4.1 g/dL (ref 3.5–5.2)
ALK PHOS: 100 U/L (ref 39–117)
ALT: 37 U/L — ABNORMAL HIGH (ref 0–35)
AST: 28 U/L (ref 0–37)
BUN: 13 mg/dL (ref 6–23)
CALCIUM: 9.4 mg/dL (ref 8.4–10.5)
CHLORIDE: 105 meq/L (ref 96–112)
CO2: 28 mEq/L (ref 19–32)
Creatinine, Ser: 0.82 mg/dL (ref 0.40–1.20)
GFR: 77.82 mL/min (ref >60.00)
Glucose, Bld: 95 mg/dL (ref 70–99)
POTASSIUM: 3.6 meq/L (ref 3.5–5.1)
Sodium: 141 mEq/L (ref 135–145)
TOTAL PROTEIN: 7.3 g/dL (ref 6.0–8.3)
Total Bilirubin: 1 mg/dL (ref 0.2–1.2)

## 2018-06-03 LAB — HEMOGLOBIN A1C: HEMOGLOBIN A1C: 5.9 % (ref 4.6–6.5)

## 2018-06-03 LAB — LIPID PANEL
CHOLESTEROL: 146 mg/dL (ref 0–200)
HDL: 47.6 mg/dL (ref >39.00)
LDL CALC: 70 mg/dL (ref 0–99)
NonHDL: 97.96
Total CHOL/HDL Ratio: 3
Triglycerides: 140 mg/dL (ref 0.0–149.0)
VLDL: 28 mg/dL (ref 0.0–40.0)

## 2018-06-04 ENCOUNTER — Telehealth: Payer: 59 | Admitting: Physician Assistant

## 2018-06-04 DIAGNOSIS — N3000 Acute cystitis without hematuria: Secondary | ICD-10-CM

## 2018-06-04 MED ORDER — NITROFURANTOIN MONOHYD MACRO 100 MG PO CAPS: 100.0000 mg | ORAL_CAPSULE | Freq: Two times a day (BID) | ORAL | 0 refills | Status: AC

## 2018-06-04 NOTE — Progress Notes (Signed)

## 2018-06-07 ENCOUNTER — Ambulatory Visit (INDEPENDENT_AMBULATORY_CARE_PROVIDER_SITE_OTHER): Payer: 59 | Admitting: Primary Care

## 2018-06-07 ENCOUNTER — Encounter: Payer: Self-pay | Admitting: Primary Care

## 2018-06-07 VITALS — BP 128/86 | HR 65 | Temp 97.6°F | Ht 62.5 in | Wt 245.0 lb

## 2018-06-07 DIAGNOSIS — F4320 Adjustment disorder, unspecified: Secondary | ICD-10-CM

## 2018-06-07 DIAGNOSIS — F4321 Adjustment disorder with depressed mood: Secondary | ICD-10-CM | POA: Insufficient documentation

## 2018-06-07 DIAGNOSIS — M25561 Pain in right knee: Secondary | ICD-10-CM

## 2018-06-07 DIAGNOSIS — Z0000 Encounter for general adult medical examination without abnormal findings: Secondary | ICD-10-CM

## 2018-06-07 DIAGNOSIS — G2581 Restless legs syndrome: Secondary | ICD-10-CM

## 2018-06-07 DIAGNOSIS — R7303 Prediabetes: Secondary | ICD-10-CM | POA: Diagnosis not present

## 2018-06-07 DIAGNOSIS — M25562 Pain in left knee: Secondary | ICD-10-CM

## 2018-06-07 DIAGNOSIS — Z1231 Encounter for screening mammogram for malignant neoplasm of breast: Secondary | ICD-10-CM | POA: Diagnosis not present

## 2018-06-07 DIAGNOSIS — G43009 Migraine without aura, not intractable, without status migrainosus: Secondary | ICD-10-CM | POA: Diagnosis not present

## 2018-06-07 DIAGNOSIS — Z1239 Encounter for other screening for malignant neoplasm of breast: Secondary | ICD-10-CM

## 2018-06-07 DIAGNOSIS — G8929 Other chronic pain: Secondary | ICD-10-CM

## 2018-06-07 HISTORY — DX: Adjustment disorder, unspecified: F43.20

## 2018-06-07 HISTORY — DX: Adjustment disorder with depressed mood: F43.21

## 2018-06-07 MED ORDER — SERTRALINE HCL 25 MG PO TABS: 25.0000 mg | ORAL_TABLET | Freq: Every day | ORAL | 0 refills | Status: DC

## 2018-06-07 NOTE — Assessment & Plan Note (Signed)
BP normal in the office today, continue monitoring. Discussed the importance of a healthy diet and regular exercise in order for weight loss, and to reduce the risk of any potential medical problems.

## 2018-06-07 NOTE — Assessment & Plan Note (Signed)
Immunizations UTD. Colonoscopy UTD. Mammogram due, pending. Discussed the importance of a healthy diet and regular exercise in order for weight loss, and to reduce the risk of any potential medical problems. Exam unremarkable. Labs reviewed.  Follow up in 1 year for CPE.

## 2018-06-07 NOTE — Patient Instructions (Signed)
Start sertraline (Zoloft) 25 mg tablets for depression/grief. Start by taking 1/2 tablet once daily for 6 days, then increase to 1 full tablet thereafter.   Start exercising. You should be getting 150 minutes of exercise weekly.  Limit take out food. Increase vegetables, fruit, whole grains, lean protein.  Ensure you are consuming 64 ounces of water daily.  Call the [facility] to schedule your Mammogram.  Follow up in 1 year for your annual exam or sooner if needed.  It was a pleasure to see you today!   Preventive Care 40-64 Years, Female Preventive care refers to lifestyle choices and visits with your health care provider that can promote health and wellness. What does preventive care include?  A yearly physical exam. This is also called an annual well check.  Dental exams once or twice a year.  Routine eye exams. Ask your health care provider how often you should have your eyes checked.  Personal lifestyle choices, including: ? Daily care of your teeth and gums. ? Regular physical activity. ? Eating a healthy diet. ? Avoiding tobacco and drug use. ? Limiting alcohol use. ? Practicing safe sex. ? Taking low-dose aspirin daily starting at age 65. ? Taking vitamin and mineral supplements as recommended by your health care provider. What happens during an annual well check? The services and screenings done by your health care provider during your annual well check will depend on your age, overall health, lifestyle risk factors, and family history of disease. Counseling Your health care provider may ask you questions about your:  Alcohol use.  Tobacco use.  Drug use.  Emotional well-being.  Home and relationship well-being.  Sexual activity.  Eating habits.  Work and work Statistician.  Method of birth control.  Menstrual cycle.  Pregnancy history.  Screening You may have the following tests or measurements:  Height, weight, and BMI.  Blood  pressure.  Lipid and cholesterol levels. These may be checked every 5 years, or more frequently if you are over 31 years old.  Skin check.  Lung cancer screening. You may have this screening every year starting at age 3 if you have a 30-pack-year history of smoking and currently smoke or have quit within the past 15 years.  Fecal occult blood test (FOBT) of the stool. You may have this test every year starting at age 44.  Flexible sigmoidoscopy or colonoscopy. You may have a sigmoidoscopy every 5 years or a colonoscopy every 10 years starting at age 30.  Hepatitis C blood test.  Hepatitis B blood test.  Sexually transmitted disease (STD) testing.  Diabetes screening. This is done by checking your blood sugar (glucose) after you have not eaten for a while (fasting). You may have this done every 1-3 years.  Mammogram. This may be done every 1-2 years. Talk to your health care provider about when you should start having regular mammograms. This may depend on whether you have a family history of breast cancer.  BRCA-related cancer screening. This may be done if you have a family history of breast, ovarian, tubal, or peritoneal cancers.  Pelvic exam and Pap test. This may be done every 3 years starting at age 62. Starting at age 26, this may be done every 5 years if you have a Pap test in combination with an HPV test.  Bone density scan. This is done to screen for osteoporosis. You may have this scan if you are at high risk for osteoporosis.  Discuss your test results, treatment  options, and if necessary, the need for more tests with your health care provider. Vaccines Your health care provider may recommend certain vaccines, such as:  Influenza vaccine. This is recommended every year.  Tetanus, diphtheria, and acellular pertussis (Tdap, Td) vaccine. You may need a Td booster every 10 years.  Varicella vaccine. You may need this if you have not been vaccinated.  Zoster vaccine. You  may need this after age 45.  Measles, mumps, and rubella (MMR) vaccine. You may need at least one dose of MMR if you were born in 1957 or later. You may also need a second dose.  Pneumococcal 13-valent conjugate (PCV13) vaccine. You may need this if you have certain conditions and were not previously vaccinated.  Pneumococcal polysaccharide (PPSV23) vaccine. You may need one or two doses if you smoke cigarettes or if you have certain conditions.  Meningococcal vaccine. You may need this if you have certain conditions.  Hepatitis A vaccine. You may need this if you have certain conditions or if you travel or work in places where you may be exposed to hepatitis A.  Hepatitis B vaccine. You may need this if you have certain conditions or if you travel or work in places where you may be exposed to hepatitis B.  Haemophilus influenzae type b (Hib) vaccine. You may need this if you have certain conditions.  Talk to your health care provider about which screenings and vaccines you need and how often you need them. This information is not intended to replace advice given to you by your health care provider. Make sure you discuss any questions you have with your health care provider. Document Released: 11/05/2015 Document Revised: 06/28/2016 Document Reviewed: 08/10/2015 Elsevier Interactive Patient Education  [employer].

## 2018-06-07 NOTE — Progress Notes (Signed)
Subjective:    Patient ID: Amy Mooney, female    DOB: 07-06-1966, 52 y.o.   MRN: [id number]  HPI  Amy Mooney is a 52 year old female who presents today for complete physical.  Grieving the loss of her son from September 2018. Symptoms of sporadic tearfulness, sadness occurring 2-3 times weekly. Cries daily on the way home from work. She was once managed on Zoloft in the past after the passing of her grandmother, did well on Zoloft. Thinks she's ready to resume. PHQ 9 score of 7 today. She's not interested in speaking with a therapist, already done so and made symptoms worse.   Immunizations: -Tetanus: Completed in 2017 -Influenza: Completed last season   Diet: She endorses a fair diet. Breakfast: Egg, bacon Lunch: Take out food Dinner: Chicken, some fruit, vegetables, starch Snacks: None Desserts: None Beverages: Water, sweet tea  Exercise: She is not exercising due to chronic knee pain Eye exam: Completed in 2018 Dental exam: Completes semi-annually  Colonoscopy: Completed in 2018 Pap Smear: Hysterectomy  Mammogram: Completed in December 2017, due   Review of Systems  Constitutional: Negative for unexpected weight change.  HENT: Negative for rhinorrhea.   Respiratory: Negative for cough and shortness of breath.   Cardiovascular: Negative for chest pain.  Gastrointestinal: Negative for constipation and diarrhea.  Genitourinary: Negative for difficulty urinating.       Hot flashes several times daily, using estradiol patches once monthly average for one weeks duration. This was prescribed after her hysterectomy  Musculoskeletal: Positive for arthralgias.  Skin: Negative for rash.  Allergic/Immunologic: Negative for environmental allergies.  Neurological: Negative for dizziness, numbness and headaches.  Psychiatric/Behavioral:       See HPI       Past Medical History:  Diagnosis Date  . Chronic kidney disease    kidney stones  . Chronic knee pain   . Migraines     . Morbid obesity with BMI of 40.0-44.9, adult ([place])   . 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 Oral herpes   . Restless leg      Social History   Socioeconomic History  . Marital status: Married    Spouse name: Amy Mooney  . Number of children: 2  . Years of education: College  . Highest education level: Not on file  Occupational History    Employer: [employer]  Social Needs  . Financial resource strain: Not on file  . Food insecurity:    Worry: Not on file    Inability: Not on file  . Transportation needs:    Medical: Not on file    Non-medical: Not on file  Tobacco Use  . Smoking status: Never Smoker  . Smokeless tobacco: Never Used  Substance and Sexual Activity  . Alcohol use: No  . Drug use: No  . Sexual activity: Not on file  Lifestyle  . Physical activity:    Days per week: Not on file    Minutes per session: Not on file  . Stress: Not on file  Relationships  . Social connections:    Talks on phone: Not on file    Gets together: Not on file    Attends religious service: Not on file    Active member of club or organization: Not on file    Attends meetings of clubs or organizations: Not on file    Relationship status: Not on file  . Intimate partner violence:    Fear of current or ex partner: Not on  file    Emotionally abused: Not on file    Physically abused: Not on file    Forced sexual activity: Not on file  Other Topics Concern  . Not on file  Social History Narrative   Patient lives at home with her husband Amy Mooney)   Patient has two adult children.   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   Patient has an college education.   Patient is right-handed.   Patient drinks very little caffeine.   Enjoys spending time with family.     Past Surgical History:  Procedure Laterality Date  . ABDOMINAL HYSTERECTOMY  2010  . artroscopic meniscus repair    . COLONOSCOPY WITH PROPOFOL N/A 06/19/2017   Procedure: COLONOSCOPY WITH PROPOFOL;   Surgeon: [name], MD;  Location: [facility] ENDOSCOPY;  Service: Endoscopy;  Laterality: N/A;  . LAPAROSCOPIC SALPINGO OOPHERECTOMY Right 03/22/2017   Procedure: LAPAROSCOPIC SALPINGO OOPHORECTOMY;  Surgeon: Ward, Honor Loh, MD;  Location: [facility] ORS;  Service: Gynecology;  Laterality: Right;    Family History  Problem Relation Age of Onset  . Breast cancer Mother 17  . Diabetes Mother   . Breast cancer Paternal Grandmother     Allergies  Allergen Reactions  . Nuvigil [Armodafinil] Hives  . Penicillins Rash    Has patient had a PCN reaction causing immediate rash, facial/tongue/throat swelling, SOB or lightheadedness with hypotension: Unknown Has patient had a PCN reaction causing severe rash involving mucus membranes or skin necrosis: Unknown Has patient had a PCN reaction that required hospitalization: Unknown Has patient had a PCN reaction occurring within the last 10 years: Unknown If all of the above answers are "NO", then may proceed with Cephalosporin use.     Current Outpatient Medications on File Prior to Visit  Medication Sig Dispense Refill  . diclofenac (VOLTAREN) 75 MG EC tablet Take 1 tablet (75 mg total) by mouth 2 (two) times daily. 60 tablet 5  . Diclofenac Sodium (PENNSAID) 2 % SOLN Pennsaid 20 mg/gram/actuation (2 %) topical soln in metered-dose pump  apply 2 PUMPS twice daily 112 g 0  . nitrofurantoin, macrocrystal-monohydrate, (MACROBID) 100 MG capsule Take 1 capsule (100 mg total) by mouth 2 (two) times daily for 5 days. 10 capsule 0  . pramipexole (MIRAPEX) 0.25 MG tablet TAKE 1 TABLET BY MOUTH 30 MINUTES TO 1 HOUR PRIOR TO BEDTIME 90 tablet 1  . rizatriptan (MAXALT) 10 MG tablet TAKE 1 TABLET BY MOUTH AT MIGRAINE ONSET MAY REPEAT IN 2 HOURS IF HEADACHE PERSISTS.NOT EXCEED 2 TABLETS IN 24HRS 10 tablet 0   No current facility-administered medications on file prior to visit.     BP 128/86 (BP Location: Left Arm, Patient Position: Sitting, Cuff Size: Large)    Pulse 65   Temp 97.6 F (36.4 C) (Oral)   Ht 5' 2.5" (1.588 m)   Wt 245 lb (111.1 kg)   SpO2 96%   BMI 44.10 kg/m    Objective:   Physical Exam  Constitutional: She is oriented to person, place, and time. She appears well-nourished.  HENT:  Mouth/Throat: No oropharyngeal exudate.  Eyes: Pupils are equal, round, and reactive to light. EOM are normal.  Neck: Neck supple. No thyromegaly present.  Cardiovascular: Normal rate and regular rhythm.  Respiratory: Effort normal and breath sounds normal.  GI: Soft. Bowel sounds are normal. There is no tenderness.  Musculoskeletal: Normal range of motion.  Neurological: She is alert and oriented to person, place, and time.  Skin: Skin is warm and dry.  Psychiatric: She has a normal mood and affect.  Tearful when discussing grief symptoms           Assessment & Plan:

## 2018-06-07 NOTE — Assessment & Plan Note (Signed)
Last migraine was several weeks ago, infrequent overall.  Continue to monitor. Using Maxalt sparingly.

## 2018-06-07 NOTE — Assessment & Plan Note (Signed)
A1C of 5.9 today. Recommended to limit take out food, start exercising as tolerated. Will continue to monitor.

## 2018-06-07 NOTE — Assessment & Plan Note (Addendum)
Following with Dr. Aline Brochure through orthopedics.  Getting injections, taking diclofenac daily.  Plans are for knee replacement, right before left.

## 2018-06-07 NOTE — Assessment & Plan Note (Signed)
Doing okay with pramipexole daily, continue same.

## 2018-06-07 NOTE — Assessment & Plan Note (Signed)
Exercise has been limited given chronic knee pain, will go for replacement soon. Recommended to improve diet, try exercise as tolerated.

## 2018-06-07 NOTE — Assessment & Plan Note (Signed)
Grieving the loss of her son from September 2018. Needing some help as she's having a tough time controlling symptoms at work. Offered sincerest condolences.  Will resume Zoloft as she did well on this in the past. Rx for Zoloft 25 mg sent to pharmacy. Patient is to take 1/2 tablet daily for 6 days, then advance to 1 full tablet thereafter. We discussed possible side effects of headache, GI upset, drowsiness, and SI/HI. If thoughts of SI/HI develop, we discussed to present to the emergency immediately. Patient verbalized understanding.   Will send a message via My Chart in 4 weeks for an update.

## 2018-06-10 ENCOUNTER — Ambulatory Visit: Payer: 59 | Admitting: Orthopedic Surgery

## 2018-06-10 VITALS — BP 130/80 | HR 68 | Ht 63.0 in | Wt 247.0 lb

## 2018-06-10 DIAGNOSIS — M170 Bilateral primary osteoarthritis of knee: Secondary | ICD-10-CM | POA: Diagnosis not present

## 2018-06-10 DIAGNOSIS — Z6841 Body Mass Index (BMI) 40.0 and over, adult: Secondary | ICD-10-CM

## 2018-06-10 NOTE — Progress Notes (Signed)
Chief Complaint  Patient presents with  . Follow-up    Recheck on bilateral knees.    Encounter Diagnoses  Name Primary?  . Primary osteoarthritis of both knees Yes  . Body mass index 40.0-44.9, adult ([place])   . Morbid obesity ([place])      Procedure note left knee injection verbal consent was obtained to inject left knee joint  Timeout was completed to confirm the site of injection  The medications used were 40 mg of Depo-Medrol and 1% lidocaine 3 cc  Anesthesia was provided by ethyl chloride and the skin was prepped with alcohol.  After cleaning the skin with alcohol a 20-gauge needle was used to inject the left knee joint. There were no complications. A sterile bandage was applied.   Procedure note right knee injection verbal consent was obtained to inject right knee joint  Timeout was completed to confirm the site of injection  The medications used were 40 mg of Depo-Medrol and 1% lidocaine 3 cc  Anesthesia was provided by ethyl chloride and the skin was prepped with alcohol.  After cleaning the skin with alcohol a 20-gauge needle was used to inject the right knee joint. There were no complications. A sterile bandage was applied.  Fu nov  Xray if planning surgery

## 2018-06-19 ENCOUNTER — Other Ambulatory Visit: Payer: Self-pay | Admitting: Primary Care

## 2018-06-19 DIAGNOSIS — G2581 Restless legs syndrome: Secondary | ICD-10-CM

## 2018-08-06 ENCOUNTER — Encounter (INDEPENDENT_AMBULATORY_CARE_PROVIDER_SITE_OTHER): Payer: 59

## 2018-08-15 ENCOUNTER — Encounter (INDEPENDENT_AMBULATORY_CARE_PROVIDER_SITE_OTHER): Payer: 59

## 2018-08-19 ENCOUNTER — Ambulatory Visit (INDEPENDENT_AMBULATORY_CARE_PROVIDER_SITE_OTHER): Payer: 59 | Admitting: Family Medicine

## 2018-08-19 ENCOUNTER — Encounter (INDEPENDENT_AMBULATORY_CARE_PROVIDER_SITE_OTHER): Payer: Self-pay | Admitting: Family Medicine

## 2018-08-19 VITALS — BP 152/85 | HR 62 | Ht 63.0 in | Wt 244.0 lb

## 2018-08-19 DIAGNOSIS — R030 Elevated blood-pressure reading, without diagnosis of hypertension: Secondary | ICD-10-CM | POA: Diagnosis not present

## 2018-08-19 DIAGNOSIS — Z9189 Other specified personal risk factors, not elsewhere classified: Secondary | ICD-10-CM | POA: Diagnosis not present

## 2018-08-19 DIAGNOSIS — R0602 Shortness of breath: Secondary | ICD-10-CM

## 2018-08-19 DIAGNOSIS — Z1331 Encounter for screening for depression: Secondary | ICD-10-CM

## 2018-08-19 DIAGNOSIS — E559 Vitamin D deficiency, unspecified: Secondary | ICD-10-CM

## 2018-08-19 DIAGNOSIS — R5383 Other fatigue: Secondary | ICD-10-CM

## 2018-08-19 DIAGNOSIS — Z6841 Body Mass Index (BMI) 40.0 and over, adult: Secondary | ICD-10-CM | POA: Diagnosis not present

## 2018-08-19 DIAGNOSIS — Z0289 Encounter for other administrative examinations: Secondary | ICD-10-CM

## 2018-08-20 ENCOUNTER — Encounter (INDEPENDENT_AMBULATORY_CARE_PROVIDER_SITE_OTHER): Payer: Self-pay | Admitting: Family Medicine

## 2018-08-20 LAB — T4, FREE: FREE T4: 1.2 ng/dL (ref 0.82–1.77)

## 2018-08-20 LAB — LIPID PANEL WITH LDL/HDL RATIO
CHOLESTEROL TOTAL: 178 mg/dL (ref 100–199)
HDL: 46 mg/dL (ref >39)
LDL Calculated: 100 mg/dL — ABNORMAL HIGH (ref 0–99)
LDl/HDL Ratio: 2.2 ratio (ref 0.0–3.2)
Triglycerides: 160 mg/dL — ABNORMAL HIGH (ref 0–149)
VLDL Cholesterol Cal: 32 mg/dL (ref 5–40)

## 2018-08-20 LAB — CBC WITH DIFFERENTIAL
BASOS ABS: 0.1 10*3/uL (ref 0.0–0.2)
BASOS: 1 %
EOS (ABSOLUTE): 0.2 10*3/uL (ref 0.0–0.4)
Eos: 2 %
HEMOGLOBIN: 13.1 g/dL (ref 11.1–15.9)
Hematocrit: 39.9 % (ref 34.0–46.6)
IMMATURE GRANS (ABS): 0 10*3/uL (ref 0.0–0.1)
Immature Granulocytes: 0 %
LYMPHS: 34 %
Lymphocytes Absolute: 3.1 10*3/uL (ref 0.7–3.1)
MCH: 27.8 pg (ref 26.6–33.0)
MCHC: 32.8 g/dL (ref 31.5–35.7)
MCV: 85 fL (ref 79–97)
MONOCYTES: 7 %
Monocytes Absolute: 0.6 10*3/uL (ref 0.1–0.9)
NEUTROS ABS: 5.2 10*3/uL (ref 1.4–7.0)
NEUTROS PCT: 56 %
RBC: 4.71 x10E6/uL (ref 3.77–5.28)
RDW: 14 % (ref 12.3–15.4)
WBC: 9.1 10*3/uL (ref 3.4–10.8)

## 2018-08-20 LAB — VITAMIN D 25 HYDROXY (VIT D DEFICIENCY, FRACTURES): Vit D, 25-Hydroxy: 45.4 ng/mL (ref 30.0–100.0)

## 2018-08-20 LAB — INSULIN, RANDOM: INSULIN: 29 u[IU]/mL — AB (ref 2.6–24.9)

## 2018-08-20 LAB — COMPREHENSIVE METABOLIC PANEL
ALBUMIN: 4.5 g/dL (ref 3.5–5.5)
ALT: 33 IU/L — AB (ref 0–32)
AST: 28 IU/L (ref 0–40)
Albumin/Globulin Ratio: 1.6 (ref 1.2–2.2)
Alkaline Phosphatase: 115 IU/L (ref 39–117)
BILIRUBIN TOTAL: 0.6 mg/dL (ref 0.0–1.2)
BUN/Creatinine Ratio: 22 (ref 9–23)
BUN: 18 mg/dL (ref 6–24)
CALCIUM: 9.2 mg/dL (ref 8.7–10.2)
CHLORIDE: 100 mmol/L (ref 96–106)
CO2: 23 mmol/L (ref 20–29)
CREATININE: 0.81 mg/dL (ref 0.57–1.00)
GFR calc Af Amer: 97 mL/min/{1.73_m2} (ref >59)
GFR calc non Af Amer: 84 mL/min/{1.73_m2} (ref >59)
GLUCOSE: 83 mg/dL (ref 65–99)
Globulin, Total: 2.8 g/dL (ref 1.5–4.5)
Potassium: 4.7 mmol/L (ref 3.5–5.2)
Sodium: 141 mmol/L (ref 134–144)
Total Protein: 7.3 g/dL (ref 6.0–8.5)

## 2018-08-20 LAB — TSH: TSH: 2.85 u[IU]/mL (ref 0.450–4.500)

## 2018-08-20 LAB — FOLATE: Folate: >20 ng/mL (ref >3.0)

## 2018-08-20 LAB — HEMOGLOBIN A1C
ESTIMATED AVERAGE GLUCOSE: 117 mg/dL
Hgb A1c MFr Bld: 5.7 % — ABNORMAL HIGH (ref 4.8–5.6)

## 2018-08-20 LAB — VITAMIN B12: Vitamin B-12: 561 pg/mL (ref 232–1245)

## 2018-08-20 LAB — T3: T3 TOTAL: 150 ng/dL (ref 71–180)

## 2018-08-20 NOTE — Progress Notes (Signed)
Office: [phone]  /  Fax: [phone]   Dear [name], NP,   Thank you for referring [name] to our clinic. The following note includes my evaluation and treatment recommendations.  HPI:   Chief Complaint: OBESITY    Cory Roughen Wachter has been referred by [name], NP for consultation regarding her obesity and obesity related comorbidities.    Amy Mooney (MR# [id number]) is a 52 y.o. female who presents on 08/20/2018 for obesity evaluation and treatment. Current BMI is Body mass index is 43.22 kg/m.Amy Mooney has been struggling with her weight for many years and has been unsuccessful in either losing weight, maintaining weight loss, or reaching her healthy weight goal.     [name] attended our information session and states she is currently in the action stage of change and ready to dedicate time achieving and maintaining a healthier weight. [name] is interested in becoming our patient and working on intensive lifestyle modifications including (but not limited to) diet, exercise and weight loss. [name] wants surgery and she must lose weight before this can be done.    [name] states her family eats meals together she thinks her family will eat healthier with  her her desired weight loss is 64 lbs she started gaining weight after 1st pregnancy her heaviest weight ever was 244 lbs. she has significant food cravings issues  she snacks frequently in the evenings she is frequently drinking liquids with calories she frequently eats larger portions than normal  she has binge eating behaviors she struggles with emotional eating    Amy Mooney feels her energy is lower than it should be. This has worsened with weight gain and has not worsened recently. Amy Mooney admits to daytime somnolence and admits to waking up still tired. Patient is at risk for obstructive sleep apnea. Patent has a history of symptoms of daytime Amy, morning Amy and morning headache.  Patient generally gets 7 or 8 hours of sleep per night, and states they generally have restless sleep. Snoring is present. Apneic episodes are present. Epworth Sleepiness Score is 16  Dyspnea on exertion Amy Mooney notes increasing shortness of breath with exercising and seems to be worsening over time with weight gain. She notes getting out of breath sooner with activity than she used to. This has not gotten worse recently. [name] denies orthopnea.  Elevated Blood Pressure without hypertension Amy Mooney is a 52 y.o. female with elevated blood pressure today at 152/85. She is not on medications. This may be white coat hypertension [name] denies chest pain or headache   At risk for cardiovascular disease Amy Mooney is at a higher than average risk for cardiovascular disease due to obesity and elevated blood pressure. She currently denies any chest pain.  Vitamin D deficiency Amy Mooney has a diagnosis of vitamin D deficiency. Amy Mooney is currently taking vit D and she admits Amy. Amy Mooney denies nausea, vomiting or muscle weakness.  Positive Depression Screen Amy Mooney had a strongly positive depression screening with a PHQ-9 score of 16 and she is struggling with emotional eating. Depression is commonly associated with obesity and often results in emotional eating behaviors. Amy Mooney has a history of depression and she has been on Zoloft previously, but she is not taking Zoloft now.  Amy Mooney's Food and Mood (modified PHQ-9) score was  Depression screen PHQ 2/9 08/19/2018  Decreased Interest 2  Down, Depressed, Hopeless 1  PHQ - 2 Score 3  Altered sleeping 2  Tired, decreased energy 3  Change in appetite 2  Feeling bad or failure about yourself  1  Trouble concentrating 2  Moving slowly or fidgety/restless 3  Suicidal thoughts 0  PHQ-9 Score 16  Difficult doing work/chores Somewhat difficult    ALLERGIES: Allergies  Allergen Reactions  . Nuvigil [Armodafinil] Hives  . Penicillins Rash      Has patient had a PCN reaction causing immediate rash, facial/tongue/throat swelling, SOB or lightheadedness with hypotension: Unknown Has patient had a PCN reaction causing severe rash involving mucus membranes or skin necrosis: Unknown Has patient had a PCN reaction that required hospitalization: Unknown Has patient had a PCN reaction occurring within the last 10 years: Unknown If all of the above answers are "NO", then may proceed with Cephalosporin use.     MEDICATIONS: Current Outpatient Medications on File Prior to Visit  Medication Sig Dispense Refill  . Calcium Carb-Cholecalciferol (CALCIUM 600+D) 600-800 MG-UNIT TABS Take 1 tablet by mouth daily.    . Cholecalciferol (VITAMIN D3) 5000 units CAPS Take 1 capsule by mouth daily.    . diclofenac (VOLTAREN) 75 MG EC tablet Take 1 tablet (75 mg total) by mouth 2 (two) times daily. 60 tablet 5  . doxycycline (VIBRAMYCIN) 100 MG capsule Take 100 mg by mouth 2 (two) times daily as needed.    Amy Kitchen estradiol (CLIMARA - DOSED IN MG/24 HR) 0.025 mg/24hr patch Place 0.025 mg onto the skin once a week.    . fluconazole (DIFLUCAN) 100 MG tablet Take 100 mg by mouth every 3 (three) days.    Amy Kitchen loratadine (CLARITIN) 10 MG tablet Take 10 mg by mouth daily as needed for allergies.    . pramipexole (MIRAPEX) 0.25 MG tablet TAKE 1 TABLET BY MOUTH 30 MINUTES TO 1 HOUR PRIOR TO BEDTIME 90 tablet 1  . rizatriptan (MAXALT) 10 MG tablet TAKE 1 TABLET BY MOUTH AT MIGRAINE ONSET MAY REPEAT IN 2 HOURS IF HEADACHE PERSISTS.NOT EXCEED 2 TABLETS IN 24HRS 10 tablet 0  . sertraline (ZOLOFT) 25 MG tablet Take 1 tablet (25 mg total) by mouth daily. 90 tablet 0  . valACYclovir (VALTREX) 1000 MG tablet Take 1,000 mg by mouth 2 (two) times daily as needed.    . Diclofenac Sodium (PENNSAID) 2 % SOLN Pen[place]aid 20 mg/gram/actuation (2 %) topical soln in metered-dose pump  apply 2 PUMPS twice daily 112 g 0   No current facility-administered medications on file prior to visit.      PAST MEDICAL HISTORY: Past Medical History:  Diagnosis Date  . Chronic kidney disease    kidney stones  . Chronic knee pain    arthritis  . Fever blister    history of  . Migraines   . Morbid obesity with BMI of 40.0-44.9, adult ([place])   . 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 Oral herpes   . Osteoarthritis   . Restless leg   . Sleep apnea    per pt not treated  . Vitamin D deficiency     PAST SURGICAL HISTORY: Past Surgical History:  Procedure Laterality Date  . ABDOMINAL HYSTERECTOMY  2010  . artroscopic meniscus repair    . cervix removed, bladder tack     2015  . COLONOSCOPY WITH PROPOFOL N/A 06/19/2017   Procedure: COLONOSCOPY WITH PROPOFOL;  Surgeon: [name], MD;  Location: [facility] ENDOSCOPY;  Service: Endoscopy;  Laterality: N/A;  . LAPAROSCOPIC SALPINGO OOPHERECTOMY Right 03/22/2017   Procedure: LAPAROSCOPIC SALPINGO OOPHORECTOMY;  Surgeon: Ward, Honor Loh, MD;  Location: [facility] ORS;  Service: Gynecology;  Laterality: Right;    SOCIAL HISTORY: Social History   Tobacco Use  . Smoking status: Former Smoker    Packs/day: 0.50    Years: 4.00    Pack years: 2.00    Last attempt to quit: 10/23/1986    Years since quitting: 31.8  . Smokeless tobacco: Never Used  Substance Use Topics  . Alcohol use: No  . Drug use: No    FAMILY HISTORY: Family History  Problem Relation Age of Onset  . Breast cancer Mother 85  . Diabetes Mother   . Depression Mother   . Anxiety disorder Mother   . Obesity Mother   . Breast cancer Paternal Grandmother     ROS: Review of Systems  Constitutional: Positive for malaise/Amy.  Respiratory: Positive for shortness of breath (on exertion).   Cardiovascular: Negative for chest pain and orthopnea.  Gastrointestinal: Negative for nausea and vomiting.  Musculoskeletal:       Negative for muscle weakness  Neurological: Negative for headaches.    PHYSICAL EXAM: Blood pressure (!) 152/85, pulse 62, height 5\' 3"  (1.6 m),  weight 244 lb (110.7 kg), SpO2 99 %. Body mass index is 43.22 kg/m. Physical Exam  Constitutional: She is oriented to person, place, and time. She appears well-developed and well-nourished.  HENT:  Head: Normocephalic and atraumatic.  Nose: Nose normal.  Eyes: EOM are normal. No scleral icterus.  Neck: Normal range of motion. Neck supple. No thyromegaly present.  Cardiovascular: Normal rate and regular rhythm.  Pulmonary/Chest: Effort normal. No respiratory distress.  Abdominal: Soft. There is no tenderness.  + Obesity  Musculoskeletal: Normal range of motion.  Range of Motion normal in all 4 extremities  Neurological: She is alert and oriented to person, place, and time. Coordination normal.  Skin: Skin is warm and dry.  Psychiatric: She has a normal mood and affect. She expresses no homicidal and no suicidal ideation.  Vitals reviewed.   RECENT LABS AND TESTS: BMET    Component Value Date/Time   NA 141 08/19/2018 1005   NA 137 05/23/2013 1515   K 4.7 08/19/2018 1005   K 3.9 05/23/2013 1515   CL 100 08/19/2018 1005   CL 104 05/23/2013 1515   CO2 23 08/19/2018 1005   CO2 35 (H) 05/23/2013 1515   GLUCOSE 83 08/19/2018 1005   GLUCOSE 95 06/03/2018 0746   GLUCOSE 68 05/23/2013 1515   BUN 18 08/19/2018 1005   BUN 10 05/23/2013 1515   CREATININE 0.81 08/19/2018 1005   CREATININE 0.86 05/23/2013 1515   CALCIUM 9.2 08/19/2018 1005   CALCIUM 8.4 (L) 05/23/2013 1515   GFRNONAA 84 08/19/2018 1005   GFRNONAA >60 05/23/2013 1515   GFRAA 97 08/19/2018 1005   GFRAA >60 05/23/2013 1515   Lab Results  Component Value Date   HGBA1C 5.7 (H) 08/19/2018   Lab Results  Component Value Date   INSULIN 29.0 (H) 08/19/2018   CBC    Component Value Date/Time   WBC 9.1 08/19/2018 1005   WBC 10.3 03/23/2017 0625   RBC 4.71 08/19/2018 1005   RBC 4.81 03/23/2017 0625   HGB 13.1 08/19/2018 1005   HCT 39.9 08/19/2018 1005   PLT 215 03/23/2017 0625   PLT 225 11/17/2016 1000   MCV 85  08/19/2018 1005   MCV 83 05/14/2013 1149   MCH 27.8 08/19/2018 1005   MCH 28.0 03/23/2017 0625   MCHC 32.8 08/19/2018 1005   MCHC 33.4 03/23/2017 0625   RDW 14.0 08/19/2018 1005  RDW 14.1 05/14/2013 1149   LYMPHSABS 3.1 08/19/2018 1005   EOSABS 0.2 08/19/2018 1005   BASOSABS 0.1 08/19/2018 1005   Iron/TIBC/Ferritin/ %Sat    Component Value Date/Time   IRON 50 06/24/2014 1542   TIBC 328 06/24/2014 1542   FERRITIN 130 06/24/2014 1542   IRONPCTSAT 15 06/24/2014 1542   Lipid Panel     Component Value Date/Time   CHOL 178 08/19/2018 1005   TRIG 160 (H) 08/19/2018 1005   HDL 46 08/19/2018 1005   CHOLHDL 3 06/03/2018 0746   VLDL 28.0 06/03/2018 0746   LDLCALC 100 (H) 08/19/2018 1005   Hepatic Function Panel     Component Value Date/Time   PROT 7.3 08/19/2018 1005   ALBUMIN 4.5 08/19/2018 1005   AST 28 08/19/2018 1005   ALT 33 (H) 08/19/2018 1005   ALKPHOS 115 08/19/2018 1005   BILITOT 0.6 08/19/2018 1005      Component Value Date/Time   TSH 2.850 08/19/2018 1005   TSH 1.70 03/24/2016 1259    ECG  shows NSR with a rate of 62 BPM INDIRECT CALORIMETER done today shows a VO2 of 243 and a REE of 1687.  Her calculated basal metabolic rate is 6063 thus her basal metabolic rate is worse than expected.    ASSESSMENT AND PLAN: Other Amy - Plan: EKG 12-Lead, Vitamin B12, CBC With Differential, Comprehensive metabolic panel, Folate, Hemoglobin A1c, Insulin, random, T3, T4, free, TSH  Shortness of breath on exertion - Plan: Lipid Panel With LDL/HDL Ratio  Blood pressure elevated without history of HTN - Plan: Comprehensive metabolic panel  Vitamin D deficiency - Plan: VITAMIN D 25 Hydroxy (Vit-D Deficiency, Fractures)  Positive depression screening  At risk for heart disease  Class 3 severe obesity with serious comorbidity and body mass index (BMI) of 40.0 to 44.9 in adult, unspecified obesity type (HCC)  PLAN: Amy Kali was informed that her Amy may be  related to obesity, depression or many other causes. Labs will be ordered, and in the meanwhile [facility] has agreed to work on diet, exercise and weight loss to help with Amy. Proper sleep hygiene was discussed including the need for 7-8 hours of quality sleep each night. A sleep study was not ordered based on symptoms and Epworth score.  Dyspnea on exertion Loreen's shortness of breath appears to be obesity related and exercise induced. She has agreed to work on weight loss and gradually increase exercise to treat her exercise induced shortness of breath. If Elli follows our instructions and loses weight without improvement of her shortness of breath, we will plan to refer to pulmonology. We will monitor this condition regularly. Jamarria agrees to this plan.  Elevated Blood Pressure without hypertension We discussed sodium restriction, working on healthy weight loss, and a regular exercise program as the means to achieve improved blood pressure control. Dajanique agreed with this plan and agreed to follow up as directed. We will recheck blood pressure in 2 weeks and will continue to monitor her blood pressure as well as her progress with the above lifestyle modifications.   Cardiovascular risk counseling Blu was given extended (15 minutes) coronary artery disease prevention counseling today. She is 52 y.o. female and has risk factors for heart disease including obesity and elevated blood pressure. We discussed intensive lifestyle modifications today with an emphasis on specific weight loss instructions and strategies. Pt was also informed of the importance of increasing exercise and decreasing saturated fats to help prevent heart disease.  Vitamin D Deficiency  Gaylin was informed that low vitamin D levels contributes to Amy and are associated with obesity, breast, and colon cancer. She will continue to take OTC Vit D and will follow up for routine testing of vitamin D, at least 2-3 times per  year. She was informed of the risk of over-replacement of vitamin D and agrees to not increase her dose unless she discusses this with [name] first. We will check vitamin D level today and Tamberly will follow up as directed.  Positive Depression Screen We will monitor this closely and work on CBT to help improve the non-hunger eating patterns. We may need to refer to Dr. Mallie Mussel our bariatric psychologist. [facility] agrees to follow up with our clinic in 2 weeks.  Obesity Yena is currently in the action stage of change and her goal is to continue with weight loss efforts. I recommend Abaigeal begin the structured treatment plan as follows:  She has agreed to follow the Category 3 plan [facility] has been instructed to eventually work up to a goal of 150 minutes of combined cardio and strengthening exercise per week for weight loss and overall health benefits. We discussed the following Behavioral Modification Strategies today: increasing lean protein intake, decreasing simple carbohydrates , decrease eating out and work on meal planning and easy cooking plans   She was informed of the importance of frequent follow up visits to maximize her success with intensive lifestyle modifications for her multiple health conditions. She was informed we would discuss her lab results at her next visit unless there is a critical issue that needs to be addressed sooner. Mairen agreed to keep her next visit at the agreed upon time to discuss these results.    OBESITY BEHAVIORAL INTERVENTION VISIT  Today's visit was # 1   Starting weight: 244 lbs Starting date: 08/19/18 Today's weight : 244 lbs  Today's date: 08/19/2018 Total lbs lost to date: 0 At least 15 minutes were spent on discussing the following behavioral intervention visit.   ASK: We discussed the diagnosis of obesity with Brittinee B Koehne today and Montrice agreed to give [name] permission to discuss obesity behavioral modification therapy today.  ASSESS: Celita  has the diagnosis of obesity and her BMI today is 43.23 Gracianna is in the action stage of change   ADVISE: Jeannene was educated on the multiple health risks of obesity as well as the benefit of weight loss to improve her health. She was advised of the need for long term treatment and the importance of lifestyle modifications to improve her current health and to decrease her risk of future health problems.  AGREE: Multiple dietary modification options and treatment options were discussed and  Brianah agreed to follow the recommendations documented in the above note.  ARRANGE: Anndee was educated on the importance of frequent visits to treat obesity as outlined per CMS and USPSTF guidelines and agreed to schedule her next follow up appointment today.  I, Doreene Nest, am acting as transcriptionist for Dennard Nip, MD   I have reviewed the above documentation for accuracy and completeness, and I agree with the above. -Dennard Nip, MD

## 2018-08-22 ENCOUNTER — Ambulatory Visit (INDEPENDENT_AMBULATORY_CARE_PROVIDER_SITE_OTHER): Payer: 59 | Admitting: Bariatrics

## 2018-08-27 ENCOUNTER — Encounter (INDEPENDENT_AMBULATORY_CARE_PROVIDER_SITE_OTHER): Payer: Self-pay | Admitting: Family Medicine

## 2018-08-30 ENCOUNTER — Ambulatory Visit
Admission: RE | Admit: 2018-08-30 | Discharge: 2018-08-30 | Disposition: A | Discharge status: Home / Self Care | Payer: 59 | Source: Ambulatory Visit | Attending: Primary Care | Admitting: Primary Care

## 2018-08-30 DIAGNOSIS — Z1231 Encounter for screening mammogram for malignant neoplasm of breast: Secondary | ICD-10-CM | POA: Diagnosis not present

## 2018-08-30 DIAGNOSIS — Z1239 Encounter for other screening for malignant neoplasm of breast: Secondary | ICD-10-CM | POA: Diagnosis not present

## 2018-09-02 ENCOUNTER — Ambulatory Visit (INDEPENDENT_AMBULATORY_CARE_PROVIDER_SITE_OTHER): Payer: 59 | Admitting: Family Medicine

## 2018-09-02 VITALS — BP 132/78 | HR 70 | Temp 97.7°F | Ht 63.0 in | Wt 240.0 lb

## 2018-09-02 DIAGNOSIS — Z6841 Body Mass Index (BMI) 40.0 and over, adult: Secondary | ICD-10-CM | POA: Diagnosis not present

## 2018-09-02 DIAGNOSIS — R7303 Prediabetes: Secondary | ICD-10-CM

## 2018-09-02 DIAGNOSIS — E782 Mixed hyperlipidemia: Secondary | ICD-10-CM

## 2018-09-02 DIAGNOSIS — Z9189 Other specified personal risk factors, not elsewhere classified: Secondary | ICD-10-CM

## 2018-09-02 DIAGNOSIS — E66813 Obesity, class 3: Secondary | ICD-10-CM

## 2018-09-03 NOTE — Progress Notes (Signed)
Office: [phone]  /  Fax: [phone]   HPI:   Chief Complaint: OBESITY Amy Mooney is here to discuss her progress with her obesity treatment plan. She is on the Category 3 plan and is following her eating plan approximately 95 % of the time. She states she is exercising 0 minutes 0 times per week. Amy Mooney did very well on her Category 2 plan. She states hunger is controlled, but she felt that it was a lot of protein. Her weight is 240 lb (108.9 kg) today and has had a weight loss of 4 pounds over a period of 2 weeks since her last visit. She has lost 4 lbs since starting treatment with [name].  Pre-Diabetes Amy Mooney has a diagnosis of prediabetes based on her elevated Hgb A1c and was informed this puts her at greater risk of developing diabetes. Her last A1c was at 5.7, which is improved from 5.9. Amy Mooney has a strong family history of diabetes. Amy Mooney is not taking metformin currently and she continues to work on diet and exercise to decrease risk of diabetes. Polyphagia has decreased on diet prescription and she denies nausea or hypoglycemia.  At risk for diabetes Amy Mooney is at higher than average risk for developing diabetes due to her obesity and pre-diabetes. She currently denies polyuria or polydipsia.  Mixed Hyperlipidemia Amy Mooney has mixed hyperlipidemia. Her triglycerides and LDL have increased over the last two months. She is attempting to control her cholesterol levels with intensive lifestyle modification including a low saturated fat diet, exercise and weight loss. She denies any chest pain.  ALLERGIES: Allergies  Allergen Reactions  . Nuvigil [Armodafinil] Hives  . Penicillins Rash    Has patient had a PCN reaction causing immediate rash, facial/tongue/throat swelling, SOB or lightheadedness with hypotension: Unknown Has patient had a PCN reaction causing severe rash involving mucus membranes or skin necrosis: Unknown Has patient had a PCN reaction that required hospitalization:  Unknown Has patient had a PCN reaction occurring within the last 10 years: Unknown If all of the above answers are "NO", then may proceed with Cephalosporin use.     MEDICATIONS: Current Outpatient Medications on File Prior to Visit  Medication Sig Dispense Refill  . Calcium Carb-Cholecalciferol (CALCIUM 600+D) 600-800 MG-UNIT TABS Take 1 tablet by mouth daily.    . Cholecalciferol (VITAMIN D3) 5000 units CAPS Take 1 capsule by mouth daily.    . diclofenac (VOLTAREN) 75 MG EC tablet Take 1 tablet (75 mg total) by mouth 2 (two) times daily. 60 tablet 5  . Diclofenac Sodium (PENNSAID) 2 % SOLN Pennsaid 20 mg/gram/actuation (2 %) topical soln in metered-dose pump  apply 2 PUMPS twice daily 112 g 0  . doxycycline (VIBRAMYCIN) 100 MG capsule Take 100 mg by mouth 2 (two) times daily as needed.    Marland Kitchen estradiol (CLIMARA - DOSED IN MG/24 HR) 0.025 mg/24hr patch Place 0.025 mg onto the skin once a week.    . fluconazole (DIFLUCAN) 100 MG tablet Take 100 mg by mouth every 3 (three) days.    Marland Kitchen loratadine (CLARITIN) 10 MG tablet Take 10 mg by mouth daily as needed for allergies.    . pramipexole (MIRAPEX) 0.25 MG tablet TAKE 1 TABLET BY MOUTH 30 MINUTES TO 1 HOUR PRIOR TO BEDTIME 90 tablet 1  . rizatriptan (MAXALT) 10 MG tablet TAKE 1 TABLET BY MOUTH AT MIGRAINE ONSET MAY REPEAT IN 2 HOURS IF HEADACHE PERSISTS.NOT EXCEED 2 TABLETS IN 24HRS 10 tablet 0  . sertraline (ZOLOFT) 25 MG tablet Take  1 tablet (25 mg total) by mouth daily. 90 tablet 0  . valACYclovir (VALTREX) 1000 MG tablet Take 1,000 mg by mouth 2 (two) times daily as needed.     No current facility-administered medications on file prior to visit.     PAST MEDICAL HISTORY: Past Medical History:  Diagnosis Date  . Chronic kidney disease    kidney stones  . Chronic knee pain    arthritis  . Fever blister    history of  . Migraines   . Morbid obesity with BMI of 40.0-44.9, adult ([place])   . 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 Oral  herpes   . Osteoarthritis   . Restless leg   . Sleep apnea    per pt not treated  . Vitamin D deficiency     PAST SURGICAL HISTORY: Past Surgical History:  Procedure Laterality Date  . ABDOMINAL HYSTERECTOMY  2010  . artroscopic meniscus repair    . cervix removed, bladder tack     2015  . COLONOSCOPY WITH PROPOFOL N/A 06/19/2017   Procedure: COLONOSCOPY WITH PROPOFOL;  Surgeon: [name], MD;  Location: [facility] ENDOSCOPY;  Service: Endoscopy;  Laterality: N/A;  . LAPAROSCOPIC SALPINGO OOPHERECTOMY Right 03/22/2017   Procedure: LAPAROSCOPIC SALPINGO OOPHORECTOMY;  Surgeon: Ward, Honor Loh, MD;  Location: [facility] ORS;  Service: Gynecology;  Laterality: Right;    SOCIAL HISTORY: Social History   Tobacco Use  . Smoking status: Former Smoker    Packs/day: 0.50    Years: 4.00    Pack years: 2.00    Last attempt to quit: 10/23/1986    Years since quitting: 31.8  . Smokeless tobacco: Never Used  Substance Use Topics  . Alcohol use: No  . Drug use: No    FAMILY HISTORY: Family History  Problem Relation Age of Onset  . Breast cancer Mother 4  . Diabetes Mother   . Depression Mother   . Anxiety disorder Mother   . Obesity Mother   . Breast cancer Paternal Grandmother     ROS: Review of Systems  Constitutional: Positive for weight loss.  Cardiovascular: Negative for chest pain.  Gastrointestinal: Negative for nausea.  Genitourinary: Negative for frequency.  Endo/Heme/Allergies: Negative for polydipsia.       + polyphagia Negative for hypoglycemia    PHYSICAL EXAM: Blood pressure 132/78, pulse 70, temperature 97.7 F (36.5 C), temperature source Oral, height 5\' 3"  (1.6 m), weight 240 lb (108.9 kg), SpO2 97 %. Body mass index is 42.51 kg/m. Physical Exam  Constitutional: She is oriented to person, place, and time. She appears well-developed and well-nourished.  Cardiovascular: Normal rate.  Pulmonary/Chest: Effort normal.  Musculoskeletal: Normal range of motion.    Neurological: She is oriented to person, place, and time.  Skin: Skin is warm and dry.  Psychiatric: She has a normal mood and affect. Her behavior is normal.  Vitals reviewed.   RECENT LABS AND TESTS: BMET    Component Value Date/Time   NA 141 08/19/2018 1005   NA 137 05/23/2013 1515   K 4.7 08/19/2018 1005   K 3.9 05/23/2013 1515   CL 100 08/19/2018 1005   CL 104 05/23/2013 1515   CO2 23 08/19/2018 1005   CO2 35 (H) 05/23/2013 1515   GLUCOSE 83 08/19/2018 1005   GLUCOSE 95 06/03/2018 0746   GLUCOSE 68 05/23/2013 1515   BUN 18 08/19/2018 1005   BUN 10 05/23/2013 1515   CREATININE 0.81 08/19/2018 1005   CREATININE 0.86 05/23/2013 1515   CALCIUM  9.2 08/19/2018 1005   CALCIUM 8.4 (L) 05/23/2013 1515   GFRNONAA 84 08/19/2018 1005   GFRNONAA >60 05/23/2013 1515   GFRAA 97 08/19/2018 1005   GFRAA >60 05/23/2013 1515   Lab Results  Component Value Date   HGBA1C 5.7 (H) 08/19/2018   HGBA1C 5.9 06/03/2018   HGBA1C 5.8 04/27/2017   HGBA1C 5.7 03/24/2016   Lab Results  Component Value Date   INSULIN 29.0 (H) 08/19/2018   CBC    Component Value Date/Time   WBC 9.1 08/19/2018 1005   WBC 10.3 03/23/2017 0625   RBC 4.71 08/19/2018 1005   RBC 4.81 03/23/2017 0625   HGB 13.1 08/19/2018 1005   HCT 39.9 08/19/2018 1005   PLT 215 03/23/2017 0625   PLT 225 11/17/2016 1000   MCV 85 08/19/2018 1005   MCV 83 05/14/2013 1149   MCH 27.8 08/19/2018 1005   MCH 28.0 03/23/2017 0625   MCHC 32.8 08/19/2018 1005   MCHC 33.4 03/23/2017 0625   RDW 14.0 08/19/2018 1005   RDW 14.1 05/14/2013 1149   LYMPHSABS 3.1 08/19/2018 1005   EOSABS 0.2 08/19/2018 1005   BASOSABS 0.1 08/19/2018 1005   Iron/TIBC/Ferritin/ %Sat    Component Value Date/Time   IRON 50 06/24/2014 1542   TIBC 328 06/24/2014 1542   FERRITIN 130 06/24/2014 1542   IRONPCTSAT 15 06/24/2014 1542   Lipid Panel     Component Value Date/Time   CHOL 178 08/19/2018 1005   TRIG 160 (H) 08/19/2018 1005   HDL 46  08/19/2018 1005   CHOLHDL 3 06/03/2018 0746   VLDL 28.0 06/03/2018 0746   LDLCALC 100 (H) 08/19/2018 1005   Hepatic Function Panel     Component Value Date/Time   PROT 7.3 08/19/2018 1005   ALBUMIN 4.5 08/19/2018 1005   AST 28 08/19/2018 1005   ALT 33 (H) 08/19/2018 1005   ALKPHOS 115 08/19/2018 1005   BILITOT 0.6 08/19/2018 1005      Component Value Date/Time   TSH 2.850 08/19/2018 1005   TSH 1.70 03/24/2016 1259   Results for ALEEYA, VEITCH (MRN [id number]) as of 09/03/2018 10:54  Ref. Range 08/19/2018 10:05  Vitamin D, 25-Hydroxy Latest Ref Range: 30.0 - 100.0 ng/mL 45.4   ASSESSMENT AND PLAN: Prediabetes  Mixed hyperlipidemia  At risk for diabetes mellitus  Class 3 severe obesity with serious comorbidity and body mass index (BMI) of 40.0 to 44.9 in adult, unspecified obesity type (Amy Mooney)  PLAN:  Pre-Diabetes Amy Mooney will continue to work on weight loss, exercise, and decreasing simple carbohydrates in her diet to help decrease the risk of diabetes. She was informed that eating too many simple carbohydrates or too many calories at one sitting increases the likelihood of GI side effects. We will defer metformin and recheck labs in 3 months. Amy Mooney agreed to follow up with [name] as directed to monitor her progress  Diabetes risk counseling Amy Mooney was given extended diabetes prevention counseling today. She is 52 y.o. female and has risk factors for diabetes including obesity and pre-diabetes. We discussed intensive lifestyle modifications today with an emphasis on weight loss as well as increasing exercise and decreasing simple carbohydrates in her diet.  Mixed Hyperlipidemia Amy Mooney was informed of the American Heart Association Guidelines emphasizing intensive lifestyle modifications as the first line treatment for hyperlipidemia. We discussed many lifestyle modifications today in depth, and Amy Mooney will continue to work on decreasing saturated fats such as fatty red meat, butter  and many fried foods. She will also  increase vegetables and lean protein in her diet and continue to work on exercise and weight loss efforts. We will recheck labs in 3 months and Amy Mooney will follow up at the agreed upon time.  Obesity Amy Mooney is currently in the action stage of change. As such, her goal is to continue with weight loss efforts She has agreed to follow the Category 2 plan Amy Mooney has been instructed to work up to a goal of 150 minutes of combined cardio and strengthening exercise per week for weight loss and overall health benefits. We discussed the following Behavioral Modification Strategies today: increasing lean protein intake, decreasing simple carbohydrates , work on meal planning and easy cooking plans and holiday eating strategies   Amy Mooney has agreed to follow up with our clinic in 3 weeks. She was informed of the importance of frequent follow up visits to maximize her success with intensive lifestyle modifications for her multiple health conditions.  I spent > than 50% of the 25 minute visit on counseling as documented in the note.     OBESITY BEHAVIORAL INTERVENTION VISIT  Today's visit was # 2   Starting weight: 244 lbs Starting date: 08/19/2018 Today's weight : 240 lbs Today's date: 09/02/2018 Total lbs lost to date: 4   ASK: We discussed the diagnosis of obesity with Amy Mooney today and Amy Mooney agreed to give [name] permission to discuss obesity behavioral modification therapy today.  ASSESS: Amy Mooney has the diagnosis of obesity and her BMI today is 42.52 Amy Mooney is in the action stage of change   ADVISE: Amy Mooney was educated on the multiple health risks of obesity as well as the benefit of weight loss to improve her health. She was advised of the need for long term treatment and the importance of lifestyle modifications to improve her current health and to decrease her risk of future health problems.  AGREE: Multiple dietary modification options and  treatment options were discussed and  Amy Mooney agreed to follow the recommendations documented in the above note.  ARRANGE: Amy Mooney was educated on the importance of frequent visits to treat obesity as outlined per CMS and USPSTF guidelines and agreed to schedule her next follow up appointment today.  I, Doreene Nest, am acting as transcriptionist for Dennard Nip, MD  I have reviewed the above documentation for accuracy and completeness, and I agree with the above. -Dennard Nip, MD

## 2018-09-05 ENCOUNTER — Ambulatory Visit (INDEPENDENT_AMBULATORY_CARE_PROVIDER_SITE_OTHER): Payer: 59 | Admitting: Bariatrics

## 2018-09-06 ENCOUNTER — Encounter: Payer: Self-pay | Admitting: Orthopedic Surgery

## 2018-09-06 ENCOUNTER — Ambulatory Visit: Payer: 59 | Admitting: Orthopedic Surgery

## 2018-09-06 ENCOUNTER — Ambulatory Visit (INDEPENDENT_AMBULATORY_CARE_PROVIDER_SITE_OTHER): Payer: 59

## 2018-09-06 VITALS — BP 142/86 | HR 68 | Ht 63.0 in | Wt 240.0 lb

## 2018-09-06 DIAGNOSIS — M170 Bilateral primary osteoarthritis of knee: Secondary | ICD-10-CM

## 2018-09-06 DIAGNOSIS — Z6841 Body Mass Index (BMI) 40.0 and over, adult: Secondary | ICD-10-CM

## 2018-09-06 NOTE — Progress Notes (Signed)
Chief Complaint  Patient presents with  . Follow-up    Recheck on bilateral knees.    Encounter Diagnosis  Name Primary?  . Primary osteoarthritis of both knees Yes    BP (!) 142/86   Pulse 68   Ht 5\' 3"  (1.6 m)   Wt 240 lb (108.9 kg)   BMI 42.51 kg/m   The patient meets the AMA guidelines for Morbid (severe) obesity with a BMI > 40.0 and I have recommended weight loss.    Procedure note left knee injection verbal consent was obtained to inject left knee joint  Timeout was completed to confirm the site of injection  The medications used were 40 mg of Depo-Medrol and 1% lidocaine 3 cc  Anesthesia was provided by ethyl chloride and the skin was prepped with alcohol.  After cleaning the skin with alcohol a 20-gauge needle was used to inject the left knee joint. There were no complications. A sterile bandage was applied.   Procedure note right knee injection verbal consent was obtained to inject right knee joint  Timeout was completed to confirm the site of injection  The medications used were 40 mg of Depo-Medrol and 1% lidocaine 3 cc  Anesthesia was provided by ethyl chloride and the skin was prepped with alcohol.  After cleaning the skin with alcohol a 20-gauge needle was used to inject the right knee joint. There were no complications. A sterile bandage was applied.   She decided to have knee replacement right knee  The procedure has been fully reviewed with the patient; The risks and benefits of surgery have been discussed and explained and understood. Alternative treatment has also been reviewed, questions were encouraged and answered. The postoperative plan is also been reviewed.   Encounter Diagnoses  Name Primary?  . Primary osteoarthritis of both knees Yes  . Body mass index 40.0-44.9, adult ([place])   . Morbid obesity ([place])    New x-rays were taken she has severe arthritis with varus on the right moderately severe arthritis with varus on the left

## 2018-09-06 NOTE — Patient Instructions (Addendum)
Your body mass index is 42.5 You are considering total knee surgery please try to lose 10 pounds before your surgery to get her body mass index under 40 to decrease her chance of wound complications   Preparing for Knee Replacement Getting prepared before knee replacement surgery can make your recovery easier and more comfortable. This document provides some tips and guidelines that will help you prepare for your surgery. You can ease any concerns about your financial responsibilities by calling your insurance company after you decide to have surgery. In addition to asking about your surgery and hospital stay, you will want to ask about coverage for medical equipment, rehabilitation facilities, and home care. How should I arrange for help? You will be stronger and more mobile every day. However, in the first couple of weeks after surgery, it is unlikely that you will be able to do all your daily activities as easily as you did before your surgery. You may tire easily and will still have limited movement in your leg. Follow these guidelines to best arrange for the help you may need after your surgery:  Plan to have someone take you home after the procedure. Your health care provider will tell you how many days you can expect to be in the hospital.  Cancel all work, caregiving, and volunteer responsibilities for at least 4-6 weeks after your surgery.  If you live alone, arrange for someone to take care of your home and pets for the first 4-6 weeks after surgery.  Plan to have someone stay with you day and night for the first week. This person should be someone you are comfortable with. You may need this person to help you with your exercises and personal care, such as bathing and using the toilet.  Arrange for drivers to take you to and from your follow-up appointments, the grocery store, and other places you may need to go for at least 4-6 weeks.  How should I prepare my home?  Pick a recovery  spot, but do not plan on recovering in bed. Sitting upright is better for your health. You may want to use a recliner with a small table nearby. Place the items you use most frequently on that table. These may include the TV remote, a cordless phone, a cell phone, a book or laptop computer, a water glass, and any other items of your choice.  Remove all clutter from your floors. Also remove any throw rugs.  To see if you will be able to move in your home with a walker, hold your hands out about 6 inches (15 cm) from your sides. Walk from your recovery spot to your kitchen and bathroom. Then walk from your bed to the bathroom. If you do not hit anything with your hands, you'll know that you have enough room for a walker.  Move the items that you use most often from your kitchen, bathroom, and bedroom to shelves and drawers that are at countertop height.  Prepare a few meals to freeze and reheat later.  Consider adding grab bars in the shower and near the toilet.  While you are in the hospital, you will learn about equipment that can be helpful for your recovery. Equipment may include raised toilet seats, tub benches, and shower benches. How should I prepare my body?  Have a preoperative exam. ? During the exam, your health care provider will make sure that your body is healthy enough to safely have this surgery. ? To your exam, take  a complete list of all your medicines and supplements, including herbs and vitamins. ? You may need to have additional tests to ensure your safety.  Have elective dental care and routine cleanings completed before your surgery. Germs from anywhere in your body, including your mouth, can travel to your new joint and infect it. It is important that you do not have any dental work performed for at least 3 months after your surgery. After surgery, be sure to tell your dentist about your joint replacement.  Maintain a healthy diet. Do not change your diet before surgery  unless your health care provider advised you to do that.  Do not use any products that contain nicotine or tobacco, such as cigarettes or e-cigarettes. Tobacco and nicotine can delay bone healing. If you need help quitting, ask your health care provider.  The day before your surgery, follow instructions from your health care provider for showering, eating, drinking, and taking medicines. These directions are for your safety. What kinds of exercises should I do? Your health care provider may have you do the following exercises before your surgery. Be sure to follow the exercise program only as directed by your health care provider. You should not feel pain while performing these exercises. Ankle pumps ( Dorsiflexion/plantar flexion) 1. Sit on a firm surface with your __________ leg straight out in front of you. Do not rest your foot on anything. 2. Move your foot at the ankle joint to tilt the top of your foot toward your shin. 3. Hold this position for __________ seconds. 4. Reverse the motion by tilting the top of your foot away from your shin. 5. Hold this position for __________ seconds. Repeat __________ times, then do the exercise with your other leg. Complete this exercise __________ times a day. Heel slides ( Knee flexion) 1. Lie on your back with both knees straight. (If this causes back discomfort, bend your healthy knee so your foot is flat on the floor. Keep this leg in this position while doing heel slides with the other leg.) 2. Slowly slide your __________ heel back toward your buttocks until you feel a gentle stretch in the front of your knee or thigh. 3. Hold this position for __________ seconds. 4. Slowly slide your __________ heel to the starting position. Repeat __________ times, then do the exercise with your other leg. Complete this exercise __________ times a day. Quadriceps, isometric 1. Lie on your back with your __________ leg extended and your other knee  bent. 2. Gradually tighten the muscles in the front of your __________ thigh. This motion will push the back of your knee down toward the surface that is under it. 3. For __________ seconds, keep the muscles as tight as you can without causing or increasing pain. 4. Relax the muscles slowly and completely after each repetition. Repeat __________ times, then do the exercise with your other leg. Complete this exercise __________ times a day. Short arc kicks 1. Lie on your back. Place a rolled towel (about 4-6 inches [10-15 cm] in diameter) under one knee so the knee is slightly bent. 2. Tighten the muscles in the front of your __________ thigh to raise only the lower part of your slightly bent leg off the floor. Do not allow your thigh to rise. 3. Hold this position for __________ seconds. Repeat __________ times, then do the exercise with your other leg. Complete this exercise __________ times a day. Straight leg raises - quadriceps 1. Lie on your back with your  __________ leg extended and your other knee bent. 2. Tighten the muscles in the front of your __________ thigh. Your thigh may shake slightly. 3. Tighten these muscles even more to raise your leg 4-6 inches (10-15 cm) off the floor. 4. Hold this position for __________ seconds. 5. Keep these muscles tight as you lower your leg. 6. Relax the muscles slowly and completely after each repetition. Repeat __________ times, then do the exercise with your other leg. Complete this exercise __________ times a day. Hamstring curls, prone If told by your health care provider, do this exercise while wearing ankle weights. Begin with __________ weights. Then increase the weight by 1 lb (0.5 kg) increments. Do not wear ankle weights that are more than __________. 1. Lie on your abdomen with your legs straight. [place] your __________ knee as far as you can comfortably do that. Keep your hips flat against the surface that is under them. When you bend  your knee, bring your foot straight toward your buttock. Do not let it fall in or out. 3. Hold this position for __________ seconds. 4. Slowly lower your leg to the starting position.  Repeat __________ times, then do the exercise with your other leg. Complete this exercise __________ times a day. Armchair push-ups 1. Find a firm, non-wheeled chair that has solid armrests. 2. Sitting in the chair, extend your __________ leg straight out in front of you. 3. Use your arms and your other leg to lift your body weight off of the seat of the chair. 4. Slowly lower your body weight to the seat. Repeat __________ times, then do the exercise with your other leg. Complete this exercise __________ times a day. This information is not intended to replace advice given to you by your health care provider. Make sure you discuss any questions you have with your health care provider. Document Released: 01/13/2011 Document Revised: 11/11/2016 Document Reviewed: 01/01/2014 Elsevier Interactive Patient Education  2018 [employer].  Total Knee Replacement Total knee replacement is a procedure to replace the knee joint with an artificial (prosthetic) knee joint. The purpose of this surgery is to reduce knee pain and improve knee function. The prosthetic knee joint (prosthesis) may be made of metal, plastic or ceramic. It replaces parts of the thigh bone (femur), lower leg bone (tibia), and kneecap (patella) that are removed during the procedure. Tell a health care provider about:  Any allergies you have.  All medicines you are taking, including vitamins, herbs, eye drops, creams, and over-the-counter medicines.  Any problems you or family members have had with anesthetic medicines.  Any blood disorders you have.  Any surgeries you have had.  Any medical conditions you have.  Whether you are pregnant or may be pregnant. What are the risks? Generally, this is a safe procedure. However, problems may  occur, including:  Infection.  Bleeding.  Allergic reactions to medicines.  Damage to other structures or organs.  Decreased range of motion of the knee.  Instability of the knee.  Loosening of the prosthetic joint.  Knee pain that does not go away (chronic pain).  What happens before the procedure?  Ask your health care provider about: ? Changing or stopping your regular medicines. This is especially important if you are taking diabetes medicines or blood thinners. ? Taking medicines such as aspirin and ibuprofen. These medicines can thin your blood. Do not take these medicines before your procedure if your health care provider instructs you not to.  Have dental care and  routine cleanings completed before your procedure. Plan to not have dental work done for 3 months after your procedure. Germs from anywhere in your body, including your mouth, can travel to your new joint and infect it.  Follow instructions from your health care provider about eating or drinking restrictions.  Ask your health care provider how your surgical site will be marked or identified.  You may be given antibiotic medicine to help prevent infection.  If your health care provider prescribes physical therapy, do exercises as instructed.  Do not use any tobacco products, such as cigarettes, chewing tobacco, or e-cigarettes. If you need help quitting, ask your health care provider.  You may have a physical exam.  You may have tests, such as: ? X-rays. ? MRI. ? CT scan. ? Bone scans.  You may have a blood or urine sample taken.  Plan to have someone take you home after the procedure.  If you will be going home right after the procedure, plan to have someone with you for at least 24 hours. It is recommended that you have someone to help care for you for at least 4-6 weeks after your procedure. What happens during the procedure?  To reduce your risk of infection: ? Your health care team will wash  or sanitize their hands. ? Your skin will be washed with soap.  An IV tube will be inserted into one of your veins.  You will be given one or more of the following: ? A medicine to help you relax (sedative). ? A medicine to numb the area (local anesthetic). ? A medicine to make you fall asleep (general anesthetic). ? A medicine that is injected into your spine to numb the area below and slightly above the injection site (spinal anesthetic). ? A medicine that is injected into an area of your body to numb everything below the injection site (regional anesthetic).  An incision will be made in your knee.  Damaged cartilage and bone will be removed from your femur, tibia, and patella.  Parts of the prosthesis (liners) will be placed over the areas of bone and cartilage that were removed. A metal liner will be placed over your femur, and plastic liners will be placed over your tibia and the underside of your patella.  One or more small tubes (drains) may be placed near your incision to help drain extra fluid from your surgical site.  Your incision will be closed with stitches (sutures), skin glue, or adhesive strips. Medicine may be applied to your incision.  A bandage (dressing) will be placed over your incision. The procedure may vary among health care providers and hospitals. What happens after the procedure?  Your blood pressure, heart rate, breathing rate, and blood oxygen level will be monitored often until the medicines you were given have worn off.  You may continue to receive fluids and medicines through an IV tube.  You will have some pain. Pain medicines will be available to help you.  You may have fluid coming from one or more drains in your incision.  You may have to wear compression stockings. These stockings help to prevent blood clots and reduce swelling in your legs.  You will be encouraged to move around as much as possible.  You may be given a continuous passive  motion machine to use at home. You will be shown how to use this machine.  Do not drive for 24 hours if you received a sedative. This information is not intended to  replace advice given to you by your health care provider. Make sure you discuss any questions you have with your health care provider. Document Released: 01/15/2001 Document Revised: 11/11/2016 Document Reviewed: 09/15/2015 Elsevier Interactive Patient Education  [employer].

## 2018-09-24 ENCOUNTER — Ambulatory Visit (INDEPENDENT_AMBULATORY_CARE_PROVIDER_SITE_OTHER): Payer: 59 | Admitting: Family Medicine

## 2018-09-24 VITALS — BP 144/83 | HR 62 | Temp 98.0°F | Ht 63.0 in | Wt 237.0 lb

## 2018-09-24 DIAGNOSIS — R030 Elevated blood-pressure reading, without diagnosis of hypertension: Secondary | ICD-10-CM | POA: Diagnosis not present

## 2018-09-24 DIAGNOSIS — Z6841 Body Mass Index (BMI) 40.0 and over, adult: Secondary | ICD-10-CM | POA: Diagnosis not present

## 2018-09-25 ENCOUNTER — Ambulatory Visit (INDEPENDENT_AMBULATORY_CARE_PROVIDER_SITE_OTHER): Payer: 59 | Admitting: Family Medicine

## 2018-09-25 ENCOUNTER — Encounter: Payer: Self-pay | Admitting: Orthopedic Surgery

## 2018-09-26 ENCOUNTER — Ambulatory Visit (INDEPENDENT_AMBULATORY_CARE_PROVIDER_SITE_OTHER): Payer: 59 | Admitting: Family Medicine

## 2018-09-26 NOTE — Progress Notes (Signed)
Office: [phone]  /  Fax: [phone]   HPI:   Chief Complaint: OBESITY Amy Mooney is here to discuss her progress with her obesity treatment plan. She is on the Category 2 plan and is following her eating plan approximately 75 % of the time. She states she is exercising 0 minutes 0 times per week. Amy Mooney continues to do well with weight loss. She is scheduled for knee replacement January 7th and she needs to lose another 7 pounds to get her BMI below 40. Her weight is 237 lb (107.5 kg) today and has had a weight loss of 3 pounds over a period of 3 weeks since her last visit. She has lost 7 lbs since starting treatment with [name].  Elevated Blood Pressure Amy Mooney has elevated blood pressure today at 144/83.  She checked her blood pressure at work and states it is always below 140/90 and she suspects she has white coat syndrome. She is working on diet and weight loss in the meanwhile. She denies chest pain and her pulse is normal.  ALLERGIES: Allergies  Allergen Reactions  . Nuvigil [Armodafinil] Hives  . Penicillins Rash    Has patient had a PCN reaction causing immediate rash, facial/tongue/throat swelling, SOB or lightheadedness with hypotension: Unknown Has patient had a PCN reaction causing severe rash involving mucus membranes or skin necrosis: Unknown Has patient had a PCN reaction that required hospitalization: Unknown Has patient had a PCN reaction occurring within the last 10 years: Unknown If all of the above answers are "NO", then may proceed with Cephalosporin use.     MEDICATIONS: Current Outpatient Medications on File Prior to Visit  Medication Sig Dispense Refill  . Calcium Carb-Cholecalciferol (CALCIUM 600+D) 600-800 MG-UNIT TABS Take 1 tablet by mouth daily.    . Cholecalciferol (VITAMIN D3) 5000 units CAPS Take 1 capsule by mouth daily.    . diclofenac (VOLTAREN) 75 MG EC tablet Take 1 tablet (75 mg total) by mouth 2 (two) times daily. 60 tablet 5  . Diclofenac  Sodium (PENNSAID) 2 % SOLN Pennsaid 20 mg/gram/actuation (2 %) topical soln in metered-dose pump  apply 2 PUMPS twice daily 112 g 0  . doxycycline (VIBRAMYCIN) 100 MG capsule Take 100 mg by mouth 2 (two) times daily as needed.    Marland Kitchen estradiol (CLIMARA - DOSED IN MG/24 HR) 0.025 mg/24hr patch Place 0.025 mg onto the skin once a week.    . fluconazole (DIFLUCAN) 100 MG tablet Take 100 mg by mouth every 3 (three) days.    Marland Kitchen loratadine (CLARITIN) 10 MG tablet Take 10 mg by mouth daily as needed for allergies.    . meloxicam (MOBIC) 15 MG tablet meloxicam 15 mg tablet    . phentermine (ADIPEX-P) 37.5 MG tablet phentermine 37.5 mg tablet    . pramipexole (MIRAPEX) 0.25 MG tablet TAKE 1 TABLET BY MOUTH 30 MINUTES TO 1 HOUR PRIOR TO BEDTIME 90 tablet 1  . rizatriptan (MAXALT) 10 MG tablet TAKE 1 TABLET BY MOUTH AT MIGRAINE ONSET MAY REPEAT IN 2 HOURS IF HEADACHE PERSISTS.NOT EXCEED 2 TABLETS IN 24HRS 10 tablet 0  . sertraline (ZOLOFT) 25 MG tablet Take 1 tablet (25 mg total) by mouth daily. 90 tablet 0  . topiramate (TOPAMAX) 25 MG tablet topiramate 25 mg tablet    . valACYclovir (VALTREX) 1000 MG tablet Take 1,000 mg by mouth 2 (two) times daily as needed.     No current facility-administered medications on file prior to visit.     PAST MEDICAL HISTORY:  Past Medical History:  Diagnosis Date  . Chronic kidney disease    kidney stones  . Chronic knee pain    arthritis  . Fever blister    history of  . Migraines   . Morbid obesity with BMI of 40.0-44.9, adult ([place])   . 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 Oral herpes   . Osteoarthritis   . Restless leg   . Sleep apnea    per pt not treated  . Vitamin D deficiency     PAST SURGICAL HISTORY: Past Surgical History:  Procedure Laterality Date  . ABDOMINAL HYSTERECTOMY  2010  . artroscopic meniscus repair    . cervix removed, bladder tack     2015  . COLONOSCOPY WITH PROPOFOL N/A 06/19/2017   Procedure: COLONOSCOPY WITH PROPOFOL;   Surgeon: [name], MD;  Location: [facility] ENDOSCOPY;  Service: Endoscopy;  Laterality: N/A;  . LAPAROSCOPIC SALPINGO OOPHERECTOMY Right 03/22/2017   Procedure: LAPAROSCOPIC SALPINGO OOPHORECTOMY;  Surgeon: Ward, Honor Loh, MD;  Location: [facility] ORS;  Service: Gynecology;  Laterality: Right;    SOCIAL HISTORY: Social History   Tobacco Use  . Smoking status: Former Smoker    Packs/day: 0.50    Years: 4.00    Pack years: 2.00    Last attempt to quit: 10/23/1986    Years since quitting: 31.9  . Smokeless tobacco: Never Used  Substance Use Topics  . Alcohol use: No  . Drug use: No    FAMILY HISTORY: Family History  Problem Relation Age of Onset  . Breast cancer Mother 75  . Diabetes Mother   . Depression Mother   . Anxiety disorder Mother   . Obesity Mother   . Breast cancer Paternal Grandmother     ROS: Review of Systems  Constitutional: Positive for weight loss.  Cardiovascular: Negative for chest pain.    PHYSICAL EXAM: Blood pressure (!) 144/83, pulse 62, temperature 98 F (36.7 C), temperature source Oral, height 5\' 3"  (1.6 m), weight 237 lb (107.5 kg), SpO2 99 %. Body mass index is 41.98 kg/m. Physical Exam  Constitutional: She is oriented to person, place, and time. She appears well-developed and well-nourished.  Cardiovascular: Normal rate.  Pulmonary/Chest: Effort normal.  Musculoskeletal: Normal range of motion.  Neurological: She is oriented to person, place, and time.  Skin: Skin is warm and dry.  Psychiatric: She has a normal mood and affect. Her behavior is normal.  Vitals reviewed.   RECENT LABS AND TESTS: BMET    Component Value Date/Time   NA 141 08/19/2018 1005   NA 137 05/23/2013 1515   K 4.7 08/19/2018 1005   K 3.9 05/23/2013 1515   CL 100 08/19/2018 1005   CL 104 05/23/2013 1515   CO2 23 08/19/2018 1005   CO2 35 (H) 05/23/2013 1515   GLUCOSE 83 08/19/2018 1005   GLUCOSE 95 06/03/2018 0746   GLUCOSE 68 05/23/2013 1515   BUN 18 08/19/2018  1005   BUN 10 05/23/2013 1515   CREATININE 0.81 08/19/2018 1005   CREATININE 0.86 05/23/2013 1515   CALCIUM 9.2 08/19/2018 1005   CALCIUM 8.4 (L) 05/23/2013 1515   GFRNONAA 84 08/19/2018 1005   GFRNONAA >60 05/23/2013 1515   GFRAA 97 08/19/2018 1005   GFRAA >60 05/23/2013 1515   Lab Results  Component Value Date   HGBA1C 5.7 (H) 08/19/2018   HGBA1C 5.9 06/03/2018   HGBA1C 5.8 04/27/2017   HGBA1C 5.7 03/24/2016   Lab Results  Component Value Date   INSULIN 29.0 (  H) 08/19/2018   CBC    Component Value Date/Time   WBC 9.1 08/19/2018 1005   WBC 10.3 03/23/2017 0625   RBC 4.71 08/19/2018 1005   RBC 4.81 03/23/2017 0625   HGB 13.1 08/19/2018 1005   HCT 39.9 08/19/2018 1005   PLT 215 03/23/2017 0625   PLT 225 11/17/2016 1000   MCV 85 08/19/2018 1005   MCV 83 05/14/2013 1149   MCH 27.8 08/19/2018 1005   MCH 28.0 03/23/2017 0625   MCHC 32.8 08/19/2018 1005   MCHC 33.4 03/23/2017 0625   RDW 14.0 08/19/2018 1005   RDW 14.1 05/14/2013 1149   LYMPHSABS 3.1 08/19/2018 1005   EOSABS 0.2 08/19/2018 1005   BASOSABS 0.1 08/19/2018 1005   Iron/TIBC/Ferritin/ %Sat    Component Value Date/Time   IRON 50 06/24/2014 1542   TIBC 328 06/24/2014 1542   FERRITIN 130 06/24/2014 1542   IRONPCTSAT 15 06/24/2014 1542   Lipid Panel     Component Value Date/Time   CHOL 178 08/19/2018 1005   TRIG 160 (H) 08/19/2018 1005   HDL 46 08/19/2018 1005   CHOLHDL 3 06/03/2018 0746   VLDL 28.0 06/03/2018 0746   LDLCALC 100 (H) 08/19/2018 1005   Hepatic Function Panel     Component Value Date/Time   PROT 7.3 08/19/2018 1005   ALBUMIN 4.5 08/19/2018 1005   AST 28 08/19/2018 1005   ALT 33 (H) 08/19/2018 1005   ALKPHOS 115 08/19/2018 1005   BILITOT 0.6 08/19/2018 1005      Component Value Date/Time   TSH 2.850 08/19/2018 1005   TSH 1.70 03/24/2016 1259    Ref. Range 08/19/2018 10:05  Vitamin D, 25-Hydroxy Latest Ref Range: 30.0 - 100.0 ng/mL 45.4   ASSESSMENT AND PLAN: Elevated  blood pressure reading  Class 3 severe obesity with serious comorbidity and body mass index (BMI) of 40.0 to 44.9 in adult, unspecified obesity type ([place])  PLAN:  Elevated Blood Pressure Amy Mooney will continue to work on diet and exercise and we will recheck her blood pressure in 2 weeks. Amy Mooney agreed to follow up at the agreed upon time.  I spent > than 50% of the 15 minute visit on counseling as documented in the note.  Obesity Amy Mooney is currently in the action stage of change. As such, her goal is to continue with weight loss efforts She has agreed to follow the Category 2 plan Amy Mooney has been instructed to work up to a goal of 150 minutes of combined cardio and strengthening exercise per week for weight loss and overall Mooney benefits. We discussed the following Behavioral Modification Strategies today: increasing lean protein intake, work on meal planning and easy cooking plans, holiday eating strategies, celebration eating strategies and decrease liquid calories  Amy Mooney has agreed to follow up with our clinic in 2 weeks. She was informed of the importance of frequent follow up visits to maximize her success with intensive lifestyle modifications for her multiple Mooney conditions.   OBESITY BEHAVIORAL INTERVENTION VISIT  Today's visit was # 3   Starting weight: 244 lbs Starting date: 08/19/2018 Today's weight : 237 lbs Today's date: 09/24/2018 Total lbs lost to date: 7   ASK: We discussed the diagnosis of obesity with Amy Mooney today and Amy Mooney agreed to give [name] permission to discuss obesity behavioral modification therapy today.  ASSESS: Amy Mooney has the diagnosis of obesity and her BMI today is 41.99 Amy Mooney is in the action stage of change   ADVISE: Amy Mooney was educated on  the multiple Mooney risks of obesity as well as the benefit of weight loss to improve her Mooney. She was advised of the need for long term treatment and the importance of lifestyle modifications to  improve her current Mooney and to decrease her risk of future Mooney problems.  AGREE: Multiple dietary modification options and treatment options were discussed and  Amy Mooney agreed to follow the recommendations documented in the above note.  ARRANGE: Amy Mooney was educated on the importance of frequent visits to treat obesity as outlined per CMS and USPSTF guidelines and agreed to schedule her next follow up appointment today.  I, Doreene Nest, am acting as transcriptionist for Dennard Nip, MD  I have reviewed the above documentation for accuracy and completeness, and I agree with the above. -Dennard Nip, MD

## 2018-10-04 ENCOUNTER — Telehealth: Payer: Self-pay | Admitting: Orthopedic Surgery

## 2018-10-04 DIAGNOSIS — M170 Bilateral primary osteoarthritis of knee: Secondary | ICD-10-CM

## 2018-10-04 NOTE — Telephone Encounter (Signed)
Patient has questions about the equipment needed following her total knee surgery 10/29/2018; states has 2 walkers - rolling walker with seat, and also standard walker; therefore, will not need walker, but needs all other equipment. Please advise. States will bring her 2020 [facility] insurance card 10/24/2018, for pre-authorization purposes.

## 2018-10-07 NOTE — Telephone Encounter (Signed)
She wants a shower bench and a 3 in one commode  fax [phone] / done

## 2018-10-09 ENCOUNTER — Ambulatory Visit (INDEPENDENT_AMBULATORY_CARE_PROVIDER_SITE_OTHER): Payer: 59 | Admitting: Family Medicine

## 2018-10-09 ENCOUNTER — Other Ambulatory Visit: Payer: Self-pay | Admitting: Primary Care

## 2018-10-09 ENCOUNTER — Other Ambulatory Visit: Payer: Self-pay | Admitting: Orthopedic Surgery

## 2018-10-09 ENCOUNTER — Encounter (INDEPENDENT_AMBULATORY_CARE_PROVIDER_SITE_OTHER): Payer: Self-pay | Admitting: Family Medicine

## 2018-10-09 VITALS — BP 148/82 | HR 66 | Temp 97.7°F | Ht 63.0 in | Wt 236.0 lb

## 2018-10-09 DIAGNOSIS — M170 Bilateral primary osteoarthritis of knee: Secondary | ICD-10-CM

## 2018-10-09 DIAGNOSIS — Z6841 Body Mass Index (BMI) 40.0 and over, adult: Secondary | ICD-10-CM

## 2018-10-09 DIAGNOSIS — G43001 Migraine without aura, not intractable, with status migrainosus: Secondary | ICD-10-CM

## 2018-10-09 DIAGNOSIS — I10 Essential (primary) hypertension: Secondary | ICD-10-CM | POA: Diagnosis not present

## 2018-10-09 DIAGNOSIS — Z9189 Other specified personal risk factors, not elsewhere classified: Secondary | ICD-10-CM | POA: Diagnosis not present

## 2018-10-09 MED ORDER — HYDROCHLOROTHIAZIDE 25 MG PO TABS: 25.0000 mg | ORAL_TABLET | Freq: Every day | ORAL | 0 refills | Status: DC

## 2018-10-10 NOTE — Progress Notes (Signed)
Office: [phone]  /  Fax: [phone]   HPI:   Chief Complaint: OBESITY Amy Mooney is here to discuss her progress with her obesity treatment plan. She is on the Category 2 plan and is following her eating plan approximately 65 % of the time. She states she is exercising 0 minutes 0 times per week. Amy Mooney has indulged more this month, but still tried to be mindful and use portion control. Her hunger is controlled.  Her weight is 236 lb (107 kg) today and has had a weight loss of 10 pounds over a period of 2 weeks since her last visit. She has lost 8 lbs since starting treatment with [name].  Hypertension Amy Mooney is a 52 y.o. female with hypertension. Carla's blood pressure is elevated again at 148/82. She is not on medications and is working on weight loss to help control her blood pressure, but it is still elevated. Navreet denies chest pain or headache.  At risk for cardiovascular disease Amy Mooney is at a higher than average risk for cardiovascular disease due to hypertension and obesity. She currently denies any chest pain.  ASSESSMENT AND PLAN:  Essential hypertension - Plan: hydrochlorothiazide (HYDRODIURIL) 25 MG tablet  At risk for heart disease  Class 3 severe obesity with serious comorbidity and body mass index (BMI) of 40.0 to 44.9 in adult, unspecified obesity type ([place])  PLAN:  Hypertension We discussed sodium restriction, working on healthy weight loss, and a regular exercise program as the means to achieve improved blood pressure control. We will continue to monitor her blood pressure as well as her progress with the above lifestyle modifications. She will watch for signs of hypotension as she continues her lifestyle modifications. Amy Mooney agrees to start HCTZ 25mg  qd #30 with no refills and we will check her blood pressure in 3 weeks. Amy Mooney agreed with this plan and agreed to follow up as directed.  Cardiovascular risk counseling Amy Mooney was given extended (15  minutes) coronary artery disease prevention counseling today. She is 52 y.o. female and has risk factors for heart disease including hypertension and obesity. We discussed intensive lifestyle modifications today with an emphasis on specific weight loss instructions and strategies. Pt was also informed of the importance of increasing exercise and decreasing saturated fats to help prevent heart disease.  Obesity Amy Mooney is currently in the action stage of change. As such, her goal is to continue with weight loss efforts. She has agreed to follow the Category 2 plan. Amy Mooney has been instructed to work up to a goal of 150 minutes of combined cardio and strengthening exercise per week for weight loss and overall health benefits. We discussed the following Behavioral Modification Strategies today: increasing lean protein intake, decreasing simple carbohydrates, work on meal planning and easy cooking plans, holiday eating strategies, and celebration eating strategies.  Amy Mooney has agreed to follow up with our clinic in 3 weeks with Dr. Owens Shark. She was informed of the importance of frequent follow up visits to maximize her success with intensive lifestyle modifications for her multiple health conditions.  ALLERGIES: Allergies  Allergen Reactions  . Nuvigil [Armodafinil] Hives  . Penicillins Rash    Has patient had a PCN reaction causing immediate rash, facial/tongue/throat swelling, SOB or lightheadedness with hypotension: Unknown Has patient had a PCN reaction causing severe rash involving mucus membranes or skin necrosis: Unknown Has patient had a PCN reaction that required hospitalization: Unknown Has patient had a PCN reaction occurring within the last 10 years: Unknown If all of  the above answers are "NO", then may proceed with Cephalosporin use.     MEDICATIONS: Current Outpatient Medications on File Prior to Visit  Medication Sig Dispense Refill  . Calcium Carb-Cholecalciferol (CALCIUM 600+D)  600-800 MG-UNIT TABS Take 1 tablet by mouth daily.    . Cholecalciferol (VITAMIN D3) 5000 units CAPS Take 1 capsule by mouth daily.    . Diclofenac Sodium (PENNSAID) 2 % SOLN Pennsaid 20 mg/gram/actuation (2 %) topical soln in metered-dose pump  apply 2 PUMPS twice daily 112 g 0  . doxycycline (VIBRAMYCIN) 100 MG capsule Take 100 mg by mouth 2 (two) times daily as needed.    Marland Kitchen estradiol (CLIMARA - DOSED IN MG/24 HR) 0.025 mg/24hr patch Place 0.025 mg onto the skin once a week.    . fluconazole (DIFLUCAN) 100 MG tablet Take 100 mg by mouth every 3 (three) days.    Marland Kitchen loratadine (CLARITIN) 10 MG tablet Take 10 mg by mouth daily as needed for allergies.    . meloxicam (MOBIC) 15 MG tablet meloxicam 15 mg tablet    . pramipexole (MIRAPEX) 0.25 MG tablet TAKE 1 TABLET BY MOUTH 30 MINUTES TO 1 HOUR PRIOR TO BEDTIME 90 tablet 1  . sertraline (ZOLOFT) 25 MG tablet Take 1 tablet (25 mg total) by mouth daily. 90 tablet 0  . topiramate (TOPAMAX) 25 MG tablet topiramate 25 mg tablet    . valACYclovir (VALTREX) 1000 MG tablet Take 1,000 mg by mouth 2 (two) times daily as needed.     No current facility-administered medications on file prior to visit.     PAST MEDICAL HISTORY: Past Medical History:  Diagnosis Date  . Chronic kidney disease    kidney stones  . Chronic knee pain    arthritis  . Fever blister    history of  . Migraines   . Morbid obesity with BMI of 40.0-44.9, adult ([place])   . 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 Oral herpes   . Osteoarthritis   . Restless leg   . Sleep apnea    per pt not treated  . Vitamin D deficiency     PAST SURGICAL HISTORY: Past Surgical History:  Procedure Laterality Date  . ABDOMINAL HYSTERECTOMY  2010  . artroscopic meniscus repair    . cervix removed, bladder tack     2015  . COLONOSCOPY WITH PROPOFOL N/A 06/19/2017   Procedure: COLONOSCOPY WITH PROPOFOL;  Surgeon: [name], MD;  Location: [facility] ENDOSCOPY;  Service: Endoscopy;  Laterality:  N/A;  . LAPAROSCOPIC SALPINGO OOPHERECTOMY Right 03/22/2017   Procedure: LAPAROSCOPIC SALPINGO OOPHORECTOMY;  Surgeon: Ward, Honor Loh, MD;  Location: [facility] ORS;  Service: Gynecology;  Laterality: Right;    SOCIAL HISTORY: Social History   Tobacco Use  . Smoking status: Former Smoker    Packs/day: 0.50    Years: 4.00    Pack years: 2.00    Last attempt to quit: 10/23/1986    Years since quitting: 31.9  . Smokeless tobacco: Never Used  Substance Use Topics  . Alcohol use: No  . Drug use: No    FAMILY HISTORY: Family History  Problem Relation Age of Onset  . Breast cancer Mother 35  . Diabetes Mother   . Depression Mother   . Anxiety disorder Mother   . Obesity Mother   . Breast cancer Paternal Grandmother    ROS: Review of Systems  Constitutional: Positive for weight loss.  Cardiovascular: Negative for chest pain.  Neurological: Negative for headaches.   PHYSICAL EXAM: Blood  pressure (!) 148/82, pulse 66, temperature 97.7 F (36.5 C), temperature source Oral, height 5\' 3"  (1.6 m), weight 236 lb (107 kg), SpO2 98 %. Body mass index is 41.81 kg/m. Physical Exam Vitals signs reviewed.  Constitutional:      Appearance: Normal appearance. She is obese.  Cardiovascular:     Rate and Rhythm: Normal rate.  Pulmonary:     Effort: Pulmonary effort is normal.  Musculoskeletal: Normal range of motion.  Skin:    General: Skin is warm and dry.  Neurological:     Mental Status: She is alert and oriented to person, place, and time.  Psychiatric:        Mood and Affect: Mood normal.        Behavior: Behavior normal.    RECENT LABS AND TESTS: BMET    Component Value Date/Time   NA 141 08/19/2018 1005   NA 137 05/23/2013 1515   K 4.7 08/19/2018 1005   K 3.9 05/23/2013 1515   CL 100 08/19/2018 1005   CL 104 05/23/2013 1515   CO2 23 08/19/2018 1005   CO2 35 (H) 05/23/2013 1515   GLUCOSE 83 08/19/2018 1005   GLUCOSE 95 06/03/2018 0746   GLUCOSE 68 05/23/2013 1515   BUN  18 08/19/2018 1005   BUN 10 05/23/2013 1515   CREATININE 0.81 08/19/2018 1005   CREATININE 0.86 05/23/2013 1515   CALCIUM 9.2 08/19/2018 1005   CALCIUM 8.4 (L) 05/23/2013 1515   GFRNONAA 84 08/19/2018 1005   GFRNONAA >60 05/23/2013 1515   GFRAA 97 08/19/2018 1005   GFRAA >60 05/23/2013 1515   Lab Results  Component Value Date   HGBA1C 5.7 (H) 08/19/2018   HGBA1C 5.9 06/03/2018   HGBA1C 5.8 04/27/2017   HGBA1C 5.7 03/24/2016   Lab Results  Component Value Date   INSULIN 29.0 (H) 08/19/2018   CBC    Component Value Date/Time   WBC 9.1 08/19/2018 1005   WBC 10.3 03/23/2017 0625   RBC 4.71 08/19/2018 1005   RBC 4.81 03/23/2017 0625   HGB 13.1 08/19/2018 1005   HCT 39.9 08/19/2018 1005   PLT 215 03/23/2017 0625   PLT 225 11/17/2016 1000   MCV 85 08/19/2018 1005   MCV 83 05/14/2013 1149   MCH 27.8 08/19/2018 1005   MCH 28.0 03/23/2017 0625   MCHC 32.8 08/19/2018 1005   MCHC 33.4 03/23/2017 0625   RDW 14.0 08/19/2018 1005   RDW 14.1 05/14/2013 1149   LYMPHSABS 3.1 08/19/2018 1005   EOSABS 0.2 08/19/2018 1005   BASOSABS 0.1 08/19/2018 1005   Iron/TIBC/Ferritin/ %Sat    Component Value Date/Time   IRON 50 06/24/2014 1542   TIBC 328 06/24/2014 1542   FERRITIN 130 06/24/2014 1542   IRONPCTSAT 15 06/24/2014 1542   Lipid Panel     Component Value Date/Time   CHOL 178 08/19/2018 1005   TRIG 160 (H) 08/19/2018 1005   HDL 46 08/19/2018 1005   CHOLHDL 3 06/03/2018 0746   VLDL 28.0 06/03/2018 0746   LDLCALC 100 (H) 08/19/2018 1005   Hepatic Function Panel     Component Value Date/Time   PROT 7.3 08/19/2018 1005   ALBUMIN 4.5 08/19/2018 1005   AST 28 08/19/2018 1005   ALT 33 (H) 08/19/2018 1005   ALKPHOS 115 08/19/2018 1005   BILITOT 0.6 08/19/2018 1005      Component Value Date/Time   TSH 2.850 08/19/2018 1005   TSH 1.70 03/24/2016 1259   Results for RAYNEISHA, BOUZA (MRN  [id number]) as of 10/10/2018 11:13  Ref. Range 08/19/2018 10:05  Vitamin D, 25-Hydroxy  Latest Ref Range: 30.0 - 100.0 ng/mL 45.4    OBESITY BEHAVIORAL INTERVENTION VISIT  Today's visit was # 4   Starting weight: 244 lbs Starting date: 08/19/18 Today's weight : Weight: 236 lb (107 kg)  Today's date: 10/09/2018 Total lbs lost to date: 8  ASK: We discussed the diagnosis of obesity with Amy Mooney today and Amy Mooney agreed to give [name] permission to discuss obesity behavioral modification therapy today.  ASSESS: Amy Mooney has the diagnosis of obesity and her BMI today is 41.8. Amy Mooney is in the action stage of change.   ADVISE: Amy Mooney was educated on the multiple health risks of obesity as well as the benefit of weight loss to improve her health. She was advised of the need for long term treatment and the importance of lifestyle modifications to improve her current health and to decrease her risk of future health problems.  AGREE: Multiple dietary modification options and treatment options were discussed and Amy Mooney agreed to follow the recommendations documented in the above note.  ARRANGE: Amy Mooney was educated on the importance of frequent visits to treat obesity as outlined per CMS and USPSTF guidelines and agreed to schedule her next follow up appointment today.  I, Marcille Blanco, am acting as transcriptionist for Starlyn Skeans, MD  I have reviewed the above documentation for accuracy and completeness, and I agree with the above. -Dennard Nip, MD

## 2018-10-14 NOTE — Patient Instructions (Signed)
Amy Mooney  10/14/2018     @PREFPERIOPPHARMACY @   Your procedure is scheduled on  10/29/2018  Report to [name] at  615   A.M.  Call this number if you have problems the morning of surgery:  [phone]   Remember:  Do not eat or drink after midnight.                        Take these medicines the morning of surgery with A SIP OF WATER  voltaren and maxalt ( if needed).    Do not wear jewelry, make-up or nail polish.  Do not wear lotions, powders, or perfumes, or deodorant.  Do not shave 48 hours prior to surgery.  Men may shave face and neck.  Do not bring valuables to the hospital.  [facility] is not responsible for any belongings or valuables.  Contacts, dentures or bridgework may not be worn into surgery.  Leave your suitcase in the car.  After surgery it may be brought to your room.  For patients admitted to the hospital, discharge time will be determined by your treatment team.  Patients discharged the day of surgery will not be allowed to drive home.   Name and phone number of your driver:   family Special instructions:  None  Please read over the following fact sheets that you were given. Pain Booklet, Coughing and Deep Breathing, Blood Transfusion Information, Lab Information, Total Joint Packet, MRSA Information, Surgical Site Infection Prevention, Anesthesia Post-op Instructions and Care and Recovery After Surgery       Total Knee Replacement Total knee replacement is a procedure to replace the knee joint with an artificial (prosthetic) knee joint. The purpose of this surgery is to reduce knee pain and improve knee function. The prosthetic knee joint (prosthesis) may be made of metal, plastic or ceramic. It replaces parts of the thigh bone (femur), lower leg bone (tibia), and kneecap (patella) that are removed during the procedure. Tell a health care provider about:  Any allergies you have.  All medicines you are taking,  including vitamins, herbs, eye drops, creams, and over-the-counter medicines.  Any problems you or family members have had with anesthetic medicines.  Any blood disorders you have.  Any surgeries you have had.  Any medical conditions you have.  Whether you are pregnant or may be pregnant. What are the risks? Generally, this is a safe procedure. However, problems may occur, including:  Infection.  Bleeding.  Allergic reactions to medicines.  Damage to other structures or organs.  Decreased range of motion of the knee.  Instability of the knee.  Loosening of the prosthetic joint.  Knee pain that does not go away (chronic pain). What happens before the procedure? Staying hydrated Follow instructions from your health care provider about hydration, which may include:  Up to 2 hours before the procedure - you may continue to drink clear liquids, such as water, clear fruit juice, black coffee, and plain tea.  Eating and drinking restrictions Follow instructions from your health care provider about eating and drinking, which may include:  8 hours before the procedure - stop eating heavy meals or foods such as meat, fried foods, or fatty foods.  6 hours before the procedure - stop eating light meals or foods, such as toast or cereal.  6 hours before the procedure - stop drinking milk or drinks that contain  milk.  2 hours before the procedure - stop drinking clear liquids. Medicines  Ask your health care provider about: ? Changing or stopping your regular medicines. This is especially important if you are taking diabetes medicines or blood thinners. ? Taking medicines such as aspirin and ibuprofen. These medicines can thin your blood. Do not take these medicines before your procedure if your health care provider instructs you not to.  You may be given antibiotic medicine to help prevent infection. General instructions  Have dental care and routine cleanings completed  before your procedure. Plan to not have dental work done for 3 months after your procedure. Germs from anywhere in your body, including your mouth, can travel to your new joint and infect it.  Ask your health care provider how your surgical site will be marked or identified.  If your health care provider prescribes physical therapy, do exercises as instructed.  Do not use any tobacco products, such as cigarettes, chewing tobacco, or e-cigarettes. If you need help quitting, ask your health care provider.  You may have a physical exam.  You may have tests, such as: ? X-rays. ? MRI. ? CT scan. ? Bone scans.  You may have a blood or urine sample taken.  Plan to have someone take you home after the procedure.  If you will be going home right after the procedure, plan to have someone with you for at least 24 hours. It is recommended that you have someone to help care for you for at least 4-6 weeks after your procedure. What happens during the procedure?   To reduce your risk of infection: ? Your health care team will wash or sanitize their hands. ? Your skin will be washed with soap.  An IV tube will be inserted into one of your veins.  You will be given one or more of the following: ? A medicine to help you relax (sedative). ? A medicine to numb the area (local anesthetic). ? A medicine to make you fall asleep (general anesthetic). ? A medicine that is injected into your spine to numb the area below and slightly above the injection site (spinal anesthetic). ? A medicine that is injected into an area of your body to numb everything below the injection site (regional anesthetic).  An incision will be made in your knee.  Damaged cartilage and bone will be removed from your femur, tibia, and patella.  Parts of the prosthesis (liners) will be placed over the areas of bone and cartilage that were removed. A metal liner will be placed over your femur, and plastic liners will be placed  over your tibia and the underside of your patella.  One or more small tubes (drains) may be placed near your incision to help drain extra fluid from your surgical site.  Your incision will be closed with stitches (sutures), skin glue, or adhesive strips. Medicine may be applied to your incision.  A bandage (dressing) will be placed over your incision. The procedure may vary among health care providers and hospitals. What happens after the procedure?  Your blood pressure, heart rate, breathing rate, and blood oxygen level will be monitored often until the medicines you were given have worn off.  You may continue to receive fluids and medicines through an IV tube.  You will have some pain. Pain medicines will be available to help you.  You may have fluid coming from one or more drains in your incision.  You may have to wear compression stockings.  These stockings help to prevent blood clots and reduce swelling in your legs.  You will be encouraged to move around as much as possible.  You may be given a continuous passive motion machine to use at home. You will be shown how to use this machine.  Do not drive for 24 hours if you received a sedative. This information is not intended to replace advice given to you by your health care provider. Make sure you discuss any questions you have with your health care provider. Document Released: 01/15/2001 Document Revised: 03/29/2017 Document Reviewed: 09/15/2015 Elsevier Interactive Patient Education  2019 [place].  Total Knee Replacement, Care After Refer to this sheet in the next few weeks. These instructions provide you with information about caring for yourself after your procedure. Your health care provider may also give you more specific instructions. Your treatment has been planned according to current medical practices, but problems sometimes occur. Call your health care provider if you have any problems or questions after your  procedure. What can I expect after the procedure? After the procedure, it is common to have:  Pain and swelling.  A small amount of blood or clear fluid coming from your incision.  Limited range of motion. Follow these instructions at home: Medicines  Take over-the-counter and prescription medicines only as told by your health care provider.  If you were prescribed an antibiotic medicine, take it as told by your health care provider. Do not stop taking the antibiotic even if you start to feel better.  If you were prescribed a blood thinner (anticoagulant), take it as told by your health care provider. Bathing  Do not take baths, swim, or use a hot tub until your health care provider approves. Ask your health care provider if you can take showers. You may only be allowed to take sponge baths for bathing.  If you have an immobilizer that is not waterproof, cover it with a watertight covering when you take a bath or shower.  Keep your bandage (dressing) dry until your health care provider says it can be removed. Incision care and drain care   Check your incision area and drain site every day for signs of infection. Check for: ? More redness, swelling, or pain. ? More fluid or blood. ? Warmth. ? Pus or a bad smell.  Follow instructions from your health care provider about how to take care of your incision. Make sure you: ? Wash your hands with soap and water before you change your dressing. If soap and water are not available, use alcohol-based hand sanitizer. ? Change your dressing as told by your health care provider. ? Leave stitches (sutures), skin glue, or adhesive strips in place. These skin closures may need to stay in place for 2 weeks or longer. If adhesive strip edges start to loosen and curl up, you may trim the loose edges. Do not remove adhesive strips completely unless your health care provider tells you to do that.  If you have a drain, follow instructions from your  health care provider about caring for it. Do not remove the drain tube or any dressings around the tube opening unless your health care provider approves. Managing pain, stiffness, and swelling      If directed, put ice on your knee. ? Put ice in a plastic bag or use the icing device (cold flow pad or cryocuff) that you were given. Follow instructions from your health care provider about how to use the icing device. ?  Place a towel between your skin and the bag or between your skin and the icing device. ? Leave the ice on for 20 minutes, 2-3 times per day.  If directed, apply heat to the affected area as often as told by your health care provider. Use the heat source that your health care provider recommends, such as a moist heat pack or a heating pad. ? Place a towel between your skin and the heat source. ? Leave the heat on for 20-30 minutes. ? Remove the heat if your skin turns bright red. This is especially important if you are unable to feel pain, heat, or cold. You may have a greater risk of getting burned.  Move your toes often to avoid stiffness and to lessen swelling.  Raise (elevate) your knee above the level of your heart while you are sitting or lying down.  Wear elastic knee support for as long as told by your health care provider. Driving   Do not drive until your health care provider approves. Ask your health care provider when it is safe to drive if you have an immobilizer on your knee.  Do not drive or operate heavy machinery while taking prescription pain medicine.  Do not drive for 24 hours if you received a sedative. Activity  Do not play contact sports until your health care provider approves.  Avoid high-impact activities, including running, jumping rope, and jumping jacks.  Avoid sitting for a long time without moving. Get up and move around at least every few hours.  If physical therapy was prescribed, do exercises as told by your health care  provider.  Return to your normal activities as told by your health care provider. Ask your health care provider what activities are safe for you. Safety  Do not use your leg to support your body weight until your health care provider approves. Use crutches or a walker as told by your health care provider. General instructions  Do not have any dental work done for at least 3 months after your surgery. When you do have dental work done, tell your dentist about your joint replacement.  Do not use any tobacco products, such as cigarettes, chewing tobacco, or e-cigarettes. If you need help quitting, ask your health care provider.  Wear compression stockings as told by your health care provider. These stockings help to prevent blood clots and reduce swelling in your legs.  If you have been sent home with a continuous passive motion machine, use it as told by your health care provider.  Drink enough fluid to keep your urine clear or pale yellow.  If you have been instructed to lose weight, follow instructions from your health care provider about how to do this safely.  Keep all follow-up visits as told by your health care provider. This is important. Contact a health care provider if:  You have more redness, swelling, or pain around your incision or drain.  You have more fluid or blood coming from your incision or drain.  Your incision or drain site feels warm to the touch.  You have pus or a bad smell coming from your incision or drain.  You have a fever.  Your incision breaks open after your health care provider removes your sutures, skin glue, or adhesive tape.  Your prosthesis feels loose.  You have knee pain that does not go away. Get help right away if:  You have a rash.  You have pain or swelling in your calf or thigh.  You have shortness of breath or difficulty breathing.  You have chest pain.  Your range of motion in your knee is getting worse. This information is  not intended to replace advice given to you by your health care provider. Make sure you discuss any questions you have with your health care provider. Document Released: 04/28/2005 Document Revised: 03/04/2017 Document Reviewed: 09/15/2015 Elsevier Interactive Patient Education  2019 [place].  Spinal Anesthesia and Epidural Anesthesia Spinal anesthesia and epidural anesthesia are methods of numbing the body. They are done by injecting numbing medicine (anesthetic) into the back, near the spinal cord. Spinal anesthesia is usually done to numb an area at and below the place where the injection is made. It is often used during surgeries of the pelvis, hips, legs, and lower abdomen. It begins to work almost immediately. Epidural anesthesia may be done to numb an area above or below the area where the injection is made. It is often used during childbirth and after major abdominal or chest surgery. It begins to work after 10-20 minutes. Tell a health care provider about:  Any allergies you have.  All medicines you are taking, including vitamins, herbs, eye drops, creams, and over-the-counter medicines.  Any problems you or family members have had with anesthetic medicines.  Any blood disorders you have.  Any surgeries you have had.  Any medical conditions you have.  Whether you are pregnant or may be pregnant.  Any recent alcohol, tobacco, or drug use. What are the risks? Generally, this is a safe procedure. However, problems may occur, including:  Headache.  A drop in blood pressure. In some cases, this can lead to a heart attack or a stroke.  Nerve damage.  Infection.  Allergic reaction to medicines.  Seizures.  Spinal fluid leak.  Bleeding around the injection site.  Inability to breathe. If this happens, a tube may be put into your windpipe (trachea) and a machine may be used to help you breathe until the anesthetic wears off.  Long-lasting numbness, pain, or loss of  function of body parts.  Nausea and vomiting.  Dizziness and fainting.  Shivering.  Itching. What happens before the procedure? Staying hydrated Follow instructions from your health care provider about hydration, which may include:  Up to 2 hours before the procedure - you may continue to drink clear liquids, such as water, clear fruit juice, black coffee, and plain tea.  Eating and drinking restrictions Follow instructions from your health care provider about eating and drinking, which may include:  8 hours before the procedure - stop eating heavy meals or foods such as meat, fried foods, or fatty foods.  6 hours before the procedure - stop eating light meals or foods, such as toast or cereal.  6 hours before the procedure - stop drinking milk or drinks that contain milk.  2 hours before the procedure - stop drinking clear liquids. Medicine Ask your health care provider about:  Changing or stopping your regular medicines. This is especially important if you are taking diabetes medicines or blood thinners.  Changing or stopping your dietary supplements.  Taking medicines such as aspirin and ibuprofen. These medicines can thin your blood. Do not take these medicines before your procedure if your health care provider instructs you not to. General instructions   Do not use any tobacco products, such as cigarettes, chewing tobacco, and electronic cigarettes, for as long as possible before your procedure. If you need help quitting, ask your health care provider.  If  you use a sleep apnea device, ask your health care provider whether you should bring it with you on the day of your surgery.  Ask your health care provider if you will have to stay overnight at the hospital or clinic.  If you will not have to stay overnight: ? Plan to have someone take you home from the hospital or clinic. ? Plan to have a responsible adult care for you for at least 24 hours after you leave the  hospital or clinic. This is important. What happens during the procedure?   A health care provider will put patches on your chest, a cuff around your arm, or a sensor device on your finger. These will be attached to monitors that allow your health care provider to watch your blood pressure, pulse, and oxygen levels to make sure that the anesthetic does not cause any problems.  An IV tube may be inserted into one of your veins. The tube will be used to give you fluids and medicines throughout the procedure as needed.  You may be given a medicine to help you relax (sedative).  You will be asked to sit up or to lie on your side with your knees and your chin bent toward your chest. These positions open up the space between the bones in your back, making it easier to inject the medicine.  The area of your back where the medicine will be injected will be cleaned.  A medicine called a local anesthetic may be injected to numb the area where the spinal or epidural anesthetic will be injected.  A needle will be inserted between the bones of your back. While this is being done: ? Continue to breathe normally. ? Stay as still and quiet as you can. ? If you feel a tingling shock or pain going down your leg, tell your health care provider but try not to move.  The spinal or epidural anesthetic will be injected.  If you receive an epidural anesthetic and need more than one dose, a tiny, flexible tube (catheter) will be placed where the anesthetic was injected. Additional doses will be given through the catheter. If you need pain medicine after the procedure, the catheter will be kept in place.  If an epidural catheter has not been placed in your injection site or left there, a small bandage (dressing) will be placed over the injection site. The procedure may vary among health care providers and hospitals. What happens after the procedure?  You will need to stay in bed until your health care provider  says it safe for you to walk.  Your blood pressure, heart rate, breathing rate, and blood oxygen level will be monitored until the medicines you were given have worn off.  If you have a catheter, it will be removed when it is no longer needed.  Do not drive for 24 hours if you received a sedative.  It is common to have nausea and itching. There are medicines that can help with these side effects. It is also common to have: ? Sleepiness. ? Vomiting. ? Numbness or tingling in your legs. ? Trouble urinating. Summary  Spinal anesthesia and epidural anesthesia are methods of numbing the body.  Tell your health care provider about any allergies or medical problems you have, any problems you have had with anesthetic medicines, any blood disorders you have, and whether you are or may be pregnant.  If you use a sleep apnea device, ask your health care provider whether  you should bring it with you on the day of your surgery.  The spinal or epidural anesthetic will be injected through the space between the bones in your back.  Do not drive for 24 hours if you received a sedative. This information is not intended to replace advice given to you by your health care provider. Make sure you discuss any questions you have with your health care provider. Document Released: 12/30/2003 Document Revised: 05/23/2017 Document Reviewed: 01/31/2016 Elsevier Interactive Patient Education  2019 [place].  Spinal Anesthesia and Epidural Anesthesia, Care After This sheet gives you information about how to care for yourself after your procedure. Your doctor may also give you more specific instructions. If you have problems or questions, call your doctor. Follow these instructions at home: For at least 24 hours after the procedure:   Have a responsible adult stay with you. It is important to have someone help care for you until you are awake and alert.  Rest as needed.  Do not do activities where you  could fall or get hurt (injured).  Do not drive.  Do not use heavy machinery.  Do not drink alcohol.  Do not take sleeping pills or medicines that make you sleepy (drowsy).  Do not make important decisions.  Do not sign legal documents.  Do not take care of children on your own. Eating and drinking  If you throw up (vomit), drink water, juice, or soup when nausea and vomiting stop.  Drink enough fluid to keep your pee (urine) pale yellow.  Make sure you do not feel like throwing up (nauseous) before you eat solid foods.  Follow the diet that your doctor recommends. General instructions  Return to your normal activities as told by your doctor. Ask your doctor what activities are safe for you.  Take over-the-counter and prescription medicines only as told by your doctor.  If you have sleep apnea, surgery and certain medicines can raise your risk for breathing problems. Follow instructions from your doctor about when to wear your sleep device. Your doctor may tell you to wear your sleep device: ? Anytime you are sleeping, including during daytime naps. ? While taking prescription pain medicines, sleeping pills, or medicines that make you sleepy.  Do not use any products that contain nicotine or tobacco. This includes cigarettes and e-cigarettes. ? If you need help quitting, ask your doctor. ? If you smoke, do not smoke by yourself. Make sure someone is nearby in case you need help.  Keep all follow-up visits as told by your doctor. This is important. Contact a doctor if:  It has been more than one day since your procedure and you feel like throwing up.  It has been more than one day since your procedure and you throw up.  You have a rash. Get help right away if:  You have a fever.  You have a headache that lasts a long time.  You have a very bad headache.  Your vision is blurry.  You see two of a single object (double vision).  You are dizzy or  light-headed.  You faint.  Your arms or legs tingle, feel weak, or get numb.  You have trouble breathing.  You cannot pee (urinate). Summary  After the procedure, have a responsible adult stay with you at home until you are fully awake and alert.  Do not do activities that might get you injured. Do not drive, use heavy machinery, drink alcohol, or make important decisions for 24  hours after the procedure.  Take medicines as told by your doctor. Do not use products that contain nicotine or tobacco.  Get help right away if you have a fever, blurry vision, difficulty breathing or passing urine, or weakness or numbness in arms or legs. This information is not intended to replace advice given to you by your health care provider. Make sure you discuss any questions you have with your health care provider. Document Released: 01/31/2016 Document Revised: 05/23/2017 Document Reviewed: 01/31/2016 Elsevier Interactive Patient Education  2019 [place] Anesthesia, Adult General anesthesia is the use of medicines to make a person "go to sleep" (unconscious) for a medical procedure. General anesthesia must be used for certain procedures, and is often recommended for procedures that:  Last a long time.  Require you to be still or in an unusual position.  Are major and can cause blood loss. The medicines used for general anesthesia are called general anesthetics. As well as making you unconscious for a certain amount of time, these medicines:  Prevent pain.  Control your blood pressure.  Relax your muscles. Tell a health care provider about:  Any allergies you have.  All medicines you are taking, including vitamins, herbs, eye drops, creams, and over-the-counter medicines.  Any problems you or family members have had with anesthetic medicines.  Types of anesthetics you have had in the past.  Any blood disorders you have.  Any surgeries you have had.  Any medical  conditions you have.  Any recent upper respiratory, chest, or ear infections.  Any history of: ? Heart or lung conditions, such as heart failure, sleep apnea, asthma, or chronic obstructive pulmonary disease (COPD). ? Armed forces logistics/support/administrative officer. ? Depression or anxiety.  Any tobacco or drug use, including marijuana or alcohol use.  Whether you are pregnant or may be pregnant. What are the risks? Generally, this is a safe procedure. However, problems may occur, including:  Allergic reaction.  Lung and heart problems.  Inhaling food or liquid from the stomach into the lungs (aspiration).  Nerve injury.  Dental injury.  Air in the bloodstream, which can lead to stroke.  Extreme agitation or confusion (delirium) when you wake up from the anesthetic.  Waking up during your procedure and being unable to move. This is rare. These problems are more likely to develop if you are having a major surgery or if you have an advanced or serious medical condition. You can prevent some of these complications by answering all of your health care provider's questions thoroughly and by following all instructions before your procedure. General anesthesia can cause side effects, including:  Nausea or vomiting.  A sore throat from the breathing tube.  Hoarseness.  Wheezing or coughing.  Shaking chills.  Tiredness.  Body aches.  Anxiety.  Sleepiness or drowsiness.  Confusion or agitation. What happens before the procedure? Staying hydrated Follow instructions from your health care provider about hydration, which may include:  Up to 2 hours before the procedure - you may continue to drink clear liquids, such as water, clear fruit juice, black coffee, and plain tea.  Eating and drinking restrictions Follow instructions from your health care provider about eating and drinking, which may include:  8 hours before the procedure - stop eating heavy meals or foods such as meat, fried foods, or  fatty foods.  6 hours before the procedure - stop eating light meals or foods, such as toast or cereal.  6 hours before the procedure - stop drinking milk  or drinks that contain milk.  2 hours before the procedure - stop drinking clear liquids. Medicines Ask your health care provider about:  Changing or stopping your regular medicines. This is especially important if you are taking diabetes medicines or blood thinners.  Taking medicines such as aspirin and ibuprofen. These medicines can thin your blood. Do not take these medicines unless your health care provider tells you to take them.  Taking over-the-counter medicines, vitamins, herbs, and supplements. Do not take these during the week before your procedure unless your health care provider approves them. General instructions  Starting 3-6 weeks before the procedure, do not use any products that contain nicotine or tobacco, such as cigarettes and e-cigarettes. If you need help quitting, ask your health care provider.  If you brush your teeth on the morning of the procedure, make sure to spit out all of the toothpaste.  Tell your health care provider if you become ill or develop a cold, cough, or fever.  If instructed by your health care provider, bring your sleep apnea device with you on the day of your surgery (if applicable).  Ask your health care provider if you will be going home the same day, the following day, or after a longer hospital stay. ? Plan to have someone take you home from the hospital or clinic. ? Plan to have a responsible adult care for you for at least 24 hours after you leave the hospital or clinic. This is important. What happens during the procedure?   You will be given anesthetics through both of the following: ? A mask placed over your nose and mouth. ? An IV in one of your veins.  You may receive a medicine to help you relax (sedative).  After you are unconscious, a breathing tube may be inserted down  your throat to help you breathe. This will be removed before you wake up.  An anesthesia specialist will stay with you throughout your procedure. He or she will: ? Keep you comfortable and safe by continuing to give you medicines and adjusting the amount of medicine that you get. ? Monitor your blood pressure, pulse, and oxygen levels to make sure that the anesthetics do not cause any problems. The procedure may vary among health care providers and hospitals. What happens after the procedure?  Your blood pressure, temperature, heart rate, breathing rate, and blood oxygen level will be monitored until the medicines you were given have worn off.  You will wake up in a recovery area. You may wake up slowly.  If you feel anxious or agitated, you may be given medicine to help you calm down.  If you will be going home the same day, your health care provider may check to make sure you can walk, drink, and urinate.  Your health care provider will treat any pain or side effects you have before you go home.  Do not drive for 24 hours if you were given a sedative. Summary  General anesthesia is used to keep you still and prevent pain during a procedure.  It is important to tell your health care provider about your medical history and any surgeries you have had, and previous experience with anesthesia.  Follow your health care provider's instructions about when to stop eating, drinking, or taking certain medicines before your procedure.  Plan to have someone take you home from the hospital or clinic. This information is not intended to replace advice given to you by your health care provider. Make  sure you discuss any questions you have with your health care provider. Document Released: 01/16/2008 Document Revised: 02/26/2018 Document Reviewed: 05/25/2017 Elsevier Interactive Patient Education  2019 [place] Anesthesia, Adult, Care After This sheet gives you information about how to  care for yourself after your procedure. Your health care provider may also give you more specific instructions. If you have problems or questions, contact your health care provider. What can I expect after the procedure? After the procedure, the following side effects are common:  Pain or discomfort at the IV site.  Nausea.  Vomiting.  Sore throat.  Trouble concentrating.  Feeling cold or chills.  Weak or tired.  Sleepiness and fatigue.  Soreness and body aches. These side effects can affect parts of the body that were not involved in surgery. Follow these instructions at home:  For at least 24 hours after the procedure:  Have a responsible adult stay with you. It is important to have someone help care for you until you are awake and alert.  Rest as needed.  Do not: ? Participate in activities in which you could fall or become injured. ? Drive. ? Use heavy machinery. ? Drink alcohol. ? Take sleeping pills or medicines that cause drowsiness. ? Make important decisions or sign legal documents. ? Take care of children on your own. Eating and drinking  Follow any instructions from your health care provider about eating or drinking restrictions.  When you feel hungry, start by eating small amounts of foods that are soft and easy to digest (bland), such as toast. Gradually return to your regular diet.  Drink enough fluid to keep your urine pale yellow.  If you vomit, rehydrate by drinking water, juice, or clear broth. General instructions  If you have sleep apnea, surgery and certain medicines can increase your risk for breathing problems. Follow instructions from your health care provider about wearing your sleep device: ? Anytime you are sleeping, including during daytime naps. ? While taking prescription pain medicines, sleeping medicines, or medicines that make you drowsy.  Return to your normal activities as told by your health care provider. Ask your health care  provider what activities are safe for you.  Take over-the-counter and prescription medicines only as told by your health care provider.  If you smoke, do not smoke without supervision.  Keep all follow-up visits as told by your health care provider. This is important. Contact a health care provider if:  You have nausea or vomiting that does not get better with medicine.  You cannot eat or drink without vomiting.  You have pain that does not get better with medicine.  You are unable to pass urine.  You develop a skin rash.  You have a fever.  You have redness around your IV site that gets worse. Get help right away if:  You have difficulty breathing.  You have chest pain.  You have blood in your urine or stool, or you vomit blood. Summary  After the procedure, it is common to have a sore throat or nausea. It is also common to feel tired.  Have a responsible adult stay with you for the first 24 hours after general anesthesia. It is important to have someone help care for you until you are awake and alert.  When you feel hungry, start by eating small amounts of foods that are soft and easy to digest (bland), such as toast. Gradually return to your regular diet.  Drink enough fluid to keep your urine  pale yellow.  Return to your normal activities as told by your health care provider. Ask your health care provider what activities are safe for you. This information is not intended to replace advice given to you by your health care provider. Make sure you discuss any questions you have with your health care provider. Document Released: 01/15/2001 Document Revised: 05/25/2017 Document Reviewed: 05/25/2017 Elsevier Interactive Patient Education  2019 [employer].

## 2018-10-18 ENCOUNTER — Other Ambulatory Visit: Payer: Self-pay | Admitting: Orthopedic Surgery

## 2018-10-18 DIAGNOSIS — M1712 Unilateral primary osteoarthritis, left knee: Secondary | ICD-10-CM

## 2018-10-19 DIAGNOSIS — M1712 Unilateral primary osteoarthritis, left knee: Secondary | ICD-10-CM | POA: Diagnosis not present

## 2018-10-19 DIAGNOSIS — M1711 Unilateral primary osteoarthritis, right knee: Secondary | ICD-10-CM | POA: Diagnosis not present

## 2018-10-21 ENCOUNTER — Ambulatory Visit (INDEPENDENT_AMBULATORY_CARE_PROVIDER_SITE_OTHER): Payer: 59 | Admitting: Bariatrics

## 2018-10-21 ENCOUNTER — Telehealth: Payer: Self-pay | Admitting: Orthopedic Surgery

## 2018-10-21 NOTE — Telephone Encounter (Signed)
Collie Siad with [facility] called and had a request for this patient. She got the 11/15 injection and xray. She is asking if you have anything with an exam for patient's right knee, if so she is asking for you to fax it to her direct fax# [phone].

## 2018-10-22 NOTE — Telephone Encounter (Signed)
Can you made addendum to her previous note for the exam, rationale for the knee replacement? Or do I need to make her an appointment for this? She is scheduled for a total knee replacement Jan 7th, and insurance will not approve without notes regarding need for surgery.

## 2018-10-23 NOTE — H&P (Addendum)
PREOP H/P  CHIEF COMPLAINT : RIGHT  KNEE PAIN   HPI  53 YO FEMALE WITH bilateral knee pain right worse than left, she has failed non operative treatment which included oral nsiads attempted weight loss cortisone injections self directed exercises. She c/o severe dull aching knee pain that is worse with activity and doesn't radiate. She has asked to procedd with right tka    she has difficulty ambulating due to pain from OA   Review of Systems  Constitutional: Positive for weight loss.  Cardiovascular: Negative for chest pain.  Neurological: Negative for headaches.  All other systems reviewed and are negative.    Past Medical History:  Diagnosis Date  . Chronic kidney disease    kidney stones  . Chronic knee pain    arthritis  . Fever blister    history of  . Migraines   . Morbid obesity with BMI of 40.0-44.9, adult ([place])   . 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 Oral herpes   . Osteoarthritis   . Restless leg   . Sleep apnea    per pt not treated  . Vitamin D deficiency     Past Surgical History:  Procedure Laterality Date  . ABDOMINAL HYSTERECTOMY  2010  . artroscopic meniscus repair    . cervix removed, bladder tack     2015  . COLONOSCOPY WITH PROPOFOL N/A 06/19/2017   Procedure: COLONOSCOPY WITH PROPOFOL;  Surgeon: [name], MD;  Location: [facility] ENDOSCOPY;  Service: Endoscopy;  Laterality: N/A;  . LAPAROSCOPIC SALPINGO OOPHERECTOMY Right 03/22/2017   Procedure: LAPAROSCOPIC SALPINGO OOPHORECTOMY;  Surgeon: Ward, Honor Loh, MD;  Location: [facility] ORS;  Service: Gynecology;  Laterality: Right;    Family History  Problem Relation Age of Onset  . Breast cancer Mother 31  . Diabetes Mother   . Depression Mother   . Anxiety disorder Mother   . Obesity Mother   . Breast cancer Paternal Grandmother    Social History   Tobacco Use  . Smoking status: Former Smoker    Packs/day: 0.50    Years: 4.00    Pack years: 2.00    Last attempt to quit: 10/23/1986     Years since quitting: 32.0  . Smokeless tobacco: Never Used  Substance Use Topics  . Alcohol use: No  . Drug use: No    Allergies  Allergen Reactions  . Nuvigil [Armodafinil] Hives  . Penicillins Rash    Has patient had a PCN reaction causing immediate rash, facial/tongue/throat swelling, SOB or lightheadedness with hypotension: Unknown Has patient had a PCN reaction causing severe rash involving mucus membranes or skin necrosis: Unknown Has patient had a PCN reaction that required hospitalization: Unknown Has patient had a PCN reaction occurring within the last 10 years: Unknown If all of the above answers are "NO", then may proceed with Cephalosporin use.     Current Meds  Medication Sig  . Calcium Carb-Cholecalciferol (CALCIUM 600+D) 600-800 MG-UNIT TABS Take 1 tablet by mouth daily.  . Cholecalciferol (VITAMIN D3) 5000 units CAPS Take 1 capsule by mouth daily.  . diclofenac (VOLTAREN) 75 MG EC tablet TAKE 1 TABLET BY MOUTH TWICE DAILY  . Diclofenac Sodium (PENNSAID) 2 % SOLN Pennsaid 20 mg/gram/actuation (2 %) topical soln in metered-dose pump  apply 2 PUMPS twice daily (Patient taking differently: Apply 2 Pump topically 2 (two) times daily as needed (PAIN). Pennsaid 20 mg/gram/actuation (2 %) topical soln in metered-dose pump  apply 2 PUMPS twice daily)  . doxycycline (VIBRAMYCIN)  100 MG capsule Take 100 mg by mouth 2 (two) times daily as needed.  . fluconazole (DIFLUCAN) 100 MG tablet Take 100 mg by mouth daily as needed (FINGER).   . hydrochlorothiazide (HYDRODIURIL) 25 MG tablet Take 1 tablet (25 mg total) by mouth daily.  Marland Kitchen loratadine (CLARITIN) 10 MG tablet Take 10 mg by mouth daily as needed for allergies.  . Multiple Vitamins-Minerals (HAIR SKIN AND NAILS FORMULA PO) Take 1 tablet by mouth daily.  . Multiple Vitamins-Minerals (MULTIVITAMIN WITH MINERALS) tablet Take 1 tablet by mouth daily.  . pramipexole (MIRAPEX) 0.25 MG tablet TAKE 1 TABLET BY MOUTH 30 MINUTES TO 1  HOUR PRIOR TO BEDTIME (Patient taking differently: Take 0.25 mg by mouth at bedtime. Take 1 tablet by mouth 30 minutes to 1 hour prior to bedtime.)  . rizatriptan (MAXALT) 10 MG tablet TAKE 1 TABLET BY MOUTH AT MIGRAINE ONSET MAY REPEAT IN 2 HOURS IF HEADACHE PERSISTS.NOT EXCEED 2 TABLETS IN 24HRS (Patient taking differently: Take 10 mg by mouth as needed for migraine. )  . valACYclovir (VALTREX) 1000 MG tablet Take 1,000 mg by mouth 2 (two) times daily as needed.    There were no vitals taken for this visit.  Physical Exam Vitals signs and nursing note reviewed.  Constitutional:      General: She is not in acute distress.    Appearance: She is well-developed. She is not ill-appearing, toxic-appearing or diaphoretic.  HENT:     Head: Normocephalic and atraumatic.     Right Ear: External ear normal.     Left Ear: External ear normal.     Nose: Nose normal.     Mouth/Throat:     Mouth: Mucous membranes are moist.     Pharynx: No oropharyngeal exudate.  Eyes:     General: No scleral icterus.       Right eye: No discharge.        Left eye: No discharge.     Extraocular Movements: Extraocular movements intact.     Conjunctiva/sclera: Conjunctivae normal.     Pupils: Pupils are equal, round, and reactive to light.  Neck:     Musculoskeletal: Normal range of motion and neck supple.     Thyroid: No thyromegaly.     Vascular: No JVD.     Trachea: No tracheal deviation.  Cardiovascular:     Rate and Rhythm: Normal rate.     Chest Wall: PMI is not displaced.     Pulses: Normal pulses.  Pulmonary:     Effort: Pulmonary effort is normal. No respiratory distress.     Breath sounds: No stridor. No wheezing.  Abdominal:     General: Bowel sounds are normal. There is no distension.     Palpations: Abdomen is soft. There is no mass.     Tenderness: There is no abdominal tenderness. There is no rebound.  Musculoskeletal:     Right knee: She exhibits no effusion.     Left knee: She exhibits  no effusion.     Right lower leg: No edema.     Left lower leg: No edema.  Lymphadenopathy:     Cervical: No cervical adenopathy.     Lower Body: No right inguinal adenopathy. No left inguinal adenopathy.  Skin:    General: Skin is warm and dry.     Capillary Refill: Capillary refill takes less than 2 seconds.     Findings: No ecchymosis or rash. Rash is not nodular or papular.     Nails:  There is no clubbing.   Neurological:     General: No focal deficit present.     Mental Status: She is alert and oriented to person, place, and time.     Cranial Nerves: No cranial nerve deficit.     Sensory: No sensory deficit.     Motor: No abnormal muscle tone.     Coordination: Coordination normal.     Deep Tendon Reflexes: Reflexes are normal and symmetric. Reflexes normal.  Psychiatric:        Mood and Affect: Mood normal.        Behavior: Behavior normal.        Thought Content: Thought content normal.        Judgment: Judgment normal.     Right Knee Exam   Muscle Strength  The patient has normal right knee strength.  Tenderness  The patient is experiencing tenderness in the medial joint line.  Range of Motion  Extension: 0  Flexion: 120   Tests  McMurray:  Medial - negative  Varus: negative Valgus: negative Drawer:  Anterior - negative    Posterior - negative Patellar apprehension: negative  Other  Erythema: absent Scars: absent Sensation: normal Pulse: present Swelling: none Effusion: no effusion present   Left Knee Exam   Muscle Strength  The patient has normal left knee strength.  Tenderness  The patient is experiencing tenderness in the medial joint line.  Range of Motion  Extension: 0  Flexion: 120   Tests  McMurray:  Medial - negative  Varus: negative Valgus: negative Drawer:  Anterior - negative     Posterior - negative Patellar apprehension: negative  Other  Erythema: absent Scars: absent Sensation: normal Pulse: present Swelling:  none Effusion: no effusion present        MEDICAL DECISION SECTION  Xrays were done at [facility] bone quality good Mild deformity  JS narrowing moderate to severe  2nd bone changes sclerosis and osteophytes  Severe OA right knee  X-ray report at [place].  3 views of the left knee   X-rays show varus alignment mild joint space narrowing severe secondary bone changes minimal on the AP large osteophytes on the axial view and in the posterior tibia on the lateral view there is also probable fabella met and osteophyte on the posterior femur   Impression severe arthritis left knee mild varus   Severe oa right knee with mild deformity   PLAN: (Rx., injectx, surgery, frx, mri/ct) RT TKA  The procedure has been fully reviewed with the patient; The risks and benefits of surgery have been discussed and explained and understood. Alternative treatment has also been reviewed, questions were encouraged and answered. The postoperative plan is also been reviewed.   No orders of the defined types were placed in this encounter.   Arther Abbott, MD  10/23/2018 10:07 AM

## 2018-10-24 ENCOUNTER — Other Ambulatory Visit: Payer: Self-pay

## 2018-10-24 ENCOUNTER — Encounter (HOSPITAL_COMMUNITY)
Admission: RE | Admit: 2018-10-24 | Discharge: 2018-10-24 | Disposition: A | Discharge status: Home / Self Care | Payer: 59 | Source: Ambulatory Visit | Attending: Orthopedic Surgery | Admitting: Orthopedic Surgery

## 2018-10-24 ENCOUNTER — Encounter (HOSPITAL_COMMUNITY): Payer: Self-pay

## 2018-10-24 DIAGNOSIS — Z01812 Encounter for preprocedural laboratory examination: Secondary | ICD-10-CM | POA: Diagnosis not present

## 2018-10-24 HISTORY — DX: Essential (primary) hypertension: I10

## 2018-10-24 HISTORY — DX: Personal history of urinary calculi: Z87.442

## 2018-10-24 LAB — CBC WITH DIFFERENTIAL/PLATELET
ABS IMMATURE GRANULOCYTES: 0.03 10*3/uL (ref 0.00–0.07)
Basophils Absolute: 0 10*3/uL (ref 0.0–0.1)
Basophils Relative: 1 %
Eosinophils Absolute: 0.2 10*3/uL (ref 0.0–0.5)
Eosinophils Relative: 2 %
HCT: 40.9 % (ref 36.0–46.0)
HEMOGLOBIN: 12.7 g/dL (ref 12.0–15.0)
Immature Granulocytes: 0 %
Lymphocytes Relative: 37 %
Lymphs Abs: 2.9 10*3/uL (ref 0.7–4.0)
MCH: 27 pg (ref 26.0–34.0)
MCHC: 31.1 g/dL (ref 30.0–36.0)
MCV: 86.8 fL (ref 80.0–100.0)
Monocytes Absolute: 0.5 10*3/uL (ref 0.1–1.0)
Monocytes Relative: 7 %
Neutro Abs: 4.2 10*3/uL (ref 1.7–7.7)
Neutrophils Relative %: 53 %
Platelets: 217 10*3/uL (ref 150–400)
RBC: 4.71 MIL/uL (ref 3.87–5.11)
RDW: 13.5 % (ref 11.5–15.5)
WBC: 7.9 10*3/uL (ref 4.0–10.5)
nRBC: 0 % (ref 0.0–0.2)

## 2018-10-24 LAB — BASIC METABOLIC PANEL
Anion gap: 7 (ref 5–15)
BUN: 21 mg/dL — ABNORMAL HIGH (ref 6–20)
CO2: 29 mmol/L (ref 22–32)
Calcium: 9.6 mg/dL (ref 8.9–10.3)
Chloride: 101 mmol/L (ref 98–111)
Creatinine, Ser: 0.82 mg/dL (ref 0.44–1.00)
GFR calc Af Amer: >60 mL/min (ref >60)
GFR calc non Af Amer: >60 mL/min (ref >60)
Glucose, Bld: 88 mg/dL (ref 70–99)
Potassium: 3.5 mmol/L (ref 3.5–5.1)
Sodium: 137 mmol/L (ref 135–145)

## 2018-10-24 LAB — SURGICAL PCR SCREEN
MRSA, PCR: NEGATIVE
Staphylococcus aureus: NEGATIVE

## 2018-10-24 LAB — PREPARE RBC (CROSSMATCH)

## 2018-10-24 LAB — ABO/RH: ABO/RH(D): A POS

## 2018-10-28 ENCOUNTER — Ambulatory Visit (INDEPENDENT_AMBULATORY_CARE_PROVIDER_SITE_OTHER): Payer: 59 | Admitting: Bariatrics

## 2018-10-28 ENCOUNTER — Other Ambulatory Visit: Payer: Self-pay | Admitting: Orthopedic Surgery

## 2018-10-28 ENCOUNTER — Encounter (INDEPENDENT_AMBULATORY_CARE_PROVIDER_SITE_OTHER): Payer: Self-pay

## 2018-10-28 DIAGNOSIS — M1712 Unilateral primary osteoarthritis, left knee: Secondary | ICD-10-CM

## 2018-10-28 NOTE — Telephone Encounter (Signed)
Insurance will approve, can you add to the note she has difficulty ambulating due to pain from OA

## 2018-10-29 ENCOUNTER — Inpatient Hospital Stay (HOSPITAL_COMMUNITY): Payer: 59

## 2018-10-29 ENCOUNTER — Ambulatory Visit (HOSPITAL_COMMUNITY)
Admission: RE | Admit: 2018-10-29 | Discharge: 2018-10-31 | Disposition: A | Discharge status: Home / Self Care | Payer: 59 | Attending: Orthopedic Surgery | Admitting: Orthopedic Surgery

## 2018-10-29 ENCOUNTER — Encounter (HOSPITAL_COMMUNITY)
Admission: RE | Disposition: A | Discharge status: Home / Self Care | Payer: Self-pay | Source: Home / Self Care | Attending: Orthopedic Surgery

## 2018-10-29 ENCOUNTER — Encounter (HOSPITAL_COMMUNITY): Payer: Self-pay | Admitting: Anesthesiology

## 2018-10-29 ENCOUNTER — Inpatient Hospital Stay (HOSPITAL_COMMUNITY): Payer: 59 | Admitting: Anesthesiology

## 2018-10-29 ENCOUNTER — Other Ambulatory Visit: Payer: Self-pay

## 2018-10-29 DIAGNOSIS — G4730 Sleep apnea, unspecified: Secondary | ICD-10-CM | POA: Diagnosis not present

## 2018-10-29 DIAGNOSIS — Z881 Allergy status to other antibiotic agents status: Secondary | ICD-10-CM | POA: Diagnosis not present

## 2018-10-29 DIAGNOSIS — E559 Vitamin D deficiency, unspecified: Secondary | ICD-10-CM | POA: Insufficient documentation

## 2018-10-29 DIAGNOSIS — G2581 Restless legs syndrome: Secondary | ICD-10-CM | POA: Insufficient documentation

## 2018-10-29 DIAGNOSIS — Z6841 Body Mass Index (BMI) 40.0 and over, adult: Secondary | ICD-10-CM | POA: Diagnosis not present

## 2018-10-29 DIAGNOSIS — Z7982 Long term (current) use of aspirin: Secondary | ICD-10-CM | POA: Insufficient documentation

## 2018-10-29 DIAGNOSIS — Z87891 Personal history of nicotine dependence: Secondary | ICD-10-CM | POA: Diagnosis not present

## 2018-10-29 DIAGNOSIS — Z803 Family history of malignant neoplasm of breast: Secondary | ICD-10-CM | POA: Diagnosis not present

## 2018-10-29 DIAGNOSIS — E876 Hypokalemia: Secondary | ICD-10-CM | POA: Diagnosis not present

## 2018-10-29 DIAGNOSIS — G43909 Migraine, unspecified, not intractable, without status migrainosus: Secondary | ICD-10-CM | POA: Insufficient documentation

## 2018-10-29 DIAGNOSIS — Z87442 Personal history of urinary calculi: Secondary | ICD-10-CM | POA: Diagnosis not present

## 2018-10-29 DIAGNOSIS — Z880 Allergy status to penicillin: Secondary | ICD-10-CM | POA: Insufficient documentation

## 2018-10-29 DIAGNOSIS — Z79899 Other long term (current) drug therapy: Secondary | ICD-10-CM | POA: Insufficient documentation

## 2018-10-29 DIAGNOSIS — I10 Essential (primary) hypertension: Secondary | ICD-10-CM | POA: Diagnosis not present

## 2018-10-29 DIAGNOSIS — Z471 Aftercare following joint replacement surgery: Secondary | ICD-10-CM | POA: Diagnosis not present

## 2018-10-29 DIAGNOSIS — Z90710 Acquired absence of both cervix and uterus: Secondary | ICD-10-CM | POA: Diagnosis not present

## 2018-10-29 DIAGNOSIS — Z818 Family history of other mental and behavioral disorders: Secondary | ICD-10-CM | POA: Insufficient documentation

## 2018-10-29 DIAGNOSIS — Z96651 Presence of right artificial knee joint: Secondary | ICD-10-CM | POA: Diagnosis not present

## 2018-10-29 DIAGNOSIS — Z8489 Family history of other specified conditions: Secondary | ICD-10-CM | POA: Insufficient documentation

## 2018-10-29 DIAGNOSIS — Z833 Family history of diabetes mellitus: Secondary | ICD-10-CM | POA: Diagnosis not present

## 2018-10-29 DIAGNOSIS — G47419 Narcolepsy without cataplexy: Secondary | ICD-10-CM | POA: Insufficient documentation

## 2018-10-29 DIAGNOSIS — M1711 Unilateral primary osteoarthritis, right knee: Principal | ICD-10-CM | POA: Insufficient documentation

## 2018-10-29 HISTORY — PX: TOTAL KNEE ARTHROPLASTY: SHX125

## 2018-10-29 SURGERY — ARTHROPLASTY, KNEE, TOTAL
Anesthesia: Choice | Laterality: Left

## 2018-10-29 SURGERY — ARTHROPLASTY, KNEE, TOTAL
Anesthesia: Spinal | Site: Knee | Laterality: Right

## 2018-10-29 MED ORDER — ASPIRIN EC 325 MG PO TBEC
325.0000 mg | DELAYED_RELEASE_TABLET | Freq: Every day | ORAL | Status: DC
  Administered 2018-10-30 – 2018-10-31 (×2): 325 mg via ORAL
  Filled 2018-10-29 (×2): qty 1

## 2018-10-29 MED ORDER — SODIUM CHLORIDE 0.9 % IV SOLN
INTRAVENOUS | Status: AC
  Administered 2018-10-29 – 2018-10-30 (×2): via INTRAVENOUS

## 2018-10-29 MED ORDER — METOCLOPRAMIDE HCL 10 MG PO TABS: 5.0000 mg | ORAL_TABLET | Freq: Three times a day (TID) | ORAL | Status: DC | PRN

## 2018-10-29 MED ORDER — CLINDAMYCIN PHOSPHATE 900 MG/50ML IV SOLN
INTRAVENOUS | Status: AC
  Filled 2018-10-29: qty 50

## 2018-10-29 MED ORDER — DEXAMETHASONE SODIUM PHOSPHATE 10 MG/ML IJ SOLN
10.0000 mg | Freq: Once | INTRAMUSCULAR | Status: AC
  Administered 2018-10-30: 10 mg via INTRAVENOUS
  Filled 2018-10-29: qty 1

## 2018-10-29 MED ORDER — HYDROCODONE-ACETAMINOPHEN 7.5-325 MG PO TABS: 1.0000 | ORAL_TABLET | Freq: Once | ORAL | Status: DC | PRN

## 2018-10-29 MED ORDER — VITAMIN D 25 MCG (1000 UNIT) PO TABS
5000.0000 [IU] | ORAL_TABLET | Freq: Every day | ORAL | Status: DC
  Administered 2018-10-29 – 2018-10-31 (×3): 5000 [IU] via ORAL
  Filled 2018-10-29 (×2): qty 5

## 2018-10-29 MED ORDER — MIDAZOLAM HCL 2 MG/2ML IJ SOLN
INTRAMUSCULAR | Status: AC
  Filled 2018-10-29: qty 2

## 2018-10-29 MED ORDER — METOCLOPRAMIDE HCL 5 MG/ML IJ SOLN: 5.0000 mg | Freq: Three times a day (TID) | INTRAMUSCULAR | Status: DC | PRN

## 2018-10-29 MED ORDER — CALCIUM CARBONATE ANTACID 500 MG PO CHEW
1.0000 | CHEWABLE_TABLET | Freq: Every day | ORAL | Status: DC
  Administered 2018-10-29 – 2018-10-31 (×3): 200 mg via ORAL
  Filled 2018-10-29 (×3): qty 1

## 2018-10-29 MED ORDER — LORATADINE 10 MG PO TABS: 10.0000 mg | ORAL_TABLET | Freq: Every day | ORAL | Status: DC | PRN

## 2018-10-29 MED ORDER — HAIR SKIN AND NAILS FORMULA PO TABS: ORAL_TABLET | Freq: Every day | ORAL | Status: DC

## 2018-10-29 MED ORDER — FLUCONAZOLE 100 MG PO TABS: 100.0000 mg | ORAL_TABLET | Freq: Every day | ORAL | Status: DC | PRN

## 2018-10-29 MED ORDER — CELECOXIB 400 MG PO CAPS
400.0000 mg | ORAL_CAPSULE | Freq: Once | ORAL | Status: AC
  Administered 2018-10-29: 400 mg via ORAL
  Filled 2018-10-29: qty 1

## 2018-10-29 MED ORDER — BUPIVACAINE-EPINEPHRINE (PF) 0.25% -1:200000 IJ SOLN
INTRAMUSCULAR | Status: AC
  Filled 2018-10-29: qty 30

## 2018-10-29 MED ORDER — BUPIVACAINE IN DEXTROSE 0.75-8.25 % IT SOLN
INTRATHECAL | Status: DC | PRN
  Administered 2018-10-29: 12.5 mg via INTRATHECAL

## 2018-10-29 MED ORDER — SODIUM CHLORIDE 0.9 % IR SOLN
Status: DC | PRN
  Administered 2018-10-29: 3000 mL

## 2018-10-29 MED ORDER — GABAPENTIN 300 MG PO CAPS
300.0000 mg | ORAL_CAPSULE | Freq: Three times a day (TID) | ORAL | Status: DC
  Administered 2018-10-29 – 2018-10-31 (×7): 300 mg via ORAL
  Filled 2018-10-29 (×7): qty 1

## 2018-10-29 MED ORDER — LACTATED RINGERS IV SOLN
INTRAVENOUS | Status: DC | PRN
  Administered 2018-10-29 (×3): via INTRAVENOUS

## 2018-10-29 MED ORDER — 0.9 % SODIUM CHLORIDE (POUR BTL) OPTIME
TOPICAL | Status: DC | PRN
  Administered 2018-10-29: 1000 mL

## 2018-10-29 MED ORDER — SUMATRIPTAN SUCCINATE 50 MG PO TABS: 50.0000 mg | ORAL_TABLET | ORAL | Status: DC | PRN

## 2018-10-29 MED ORDER — EPINEPHRINE PF 1 MG/ML IJ SOLN
INTRAMUSCULAR | Status: AC
  Filled 2018-10-29: qty 1

## 2018-10-29 MED ORDER — DEXAMETHASONE SODIUM PHOSPHATE 4 MG/ML IJ SOLN
INTRAMUSCULAR | Status: DC | PRN
  Administered 2018-10-29: 10 mg via INTRAVENOUS

## 2018-10-29 MED ORDER — HYDROMORPHONE HCL 1 MG/ML IJ SOLN
0.2500 mg | INTRAMUSCULAR | Status: DC | PRN
  Administered 2018-10-29 (×2): 0.5 mg via INTRAVENOUS
  Filled 2018-10-29 (×2): qty 0.5

## 2018-10-29 MED ORDER — GLYCOPYRROLATE 0.2 MG/ML IJ SOLN
INTRAMUSCULAR | Status: DC | PRN
  Administered 2018-10-29: 0.2 mg via INTRAVENOUS

## 2018-10-29 MED ORDER — TRANEXAMIC ACID-NACL 1000-0.7 MG/100ML-% IV SOLN
1000.0000 mg | INTRAVENOUS | Status: AC
  Administered 2018-10-29: 1000 mg via INTRAVENOUS
  Filled 2018-10-29: qty 100

## 2018-10-29 MED ORDER — METHOCARBAMOL 500 MG PO TABS
500.0000 mg | ORAL_TABLET | Freq: Four times a day (QID) | ORAL | Status: DC | PRN
  Administered 2018-10-30 (×2): 500 mg via ORAL
  Filled 2018-10-29 (×2): qty 1

## 2018-10-29 MED ORDER — ONDANSETRON HCL 4 MG/2ML IJ SOLN
INTRAMUSCULAR | Status: DC | PRN
  Administered 2018-10-29: 4 mg via INTRAVENOUS

## 2018-10-29 MED ORDER — BUPIVACAINE LIPOSOME 1.3 % IJ SUSP
INTRAMUSCULAR | Status: AC
  Filled 2018-10-29: qty 20

## 2018-10-29 MED ORDER — LIDOCAINE 2% (20 MG/ML) 5 ML SYRINGE
INTRAMUSCULAR | Status: AC
  Filled 2018-10-29: qty 5

## 2018-10-29 MED ORDER — OXYCODONE HCL 5 MG PO TABS
5.0000 mg | ORAL_TABLET | Freq: Once | ORAL | Status: AC
  Administered 2018-10-29: 5 mg via ORAL
  Filled 2018-10-29: qty 1

## 2018-10-29 MED ORDER — CHLORHEXIDINE GLUCONATE 4 % EX LIQD: 60.0000 mL | Freq: Once | CUTANEOUS | Status: DC

## 2018-10-29 MED ORDER — TRANEXAMIC ACID-NACL 1000-0.7 MG/100ML-% IV SOLN
1000.0000 mg | Freq: Once | INTRAVENOUS | Status: AC
  Administered 2018-10-29: 1000 mg via INTRAVENOUS
  Filled 2018-10-29: qty 100

## 2018-10-29 MED ORDER — METHOCARBAMOL 1000 MG/10ML IJ SOLN
500.0000 mg | Freq: Once | INTRAVENOUS | Status: AC
  Administered 2018-10-29: 500 mg via INTRAVENOUS
  Filled 2018-10-29: qty 5

## 2018-10-29 MED ORDER — CLINDAMYCIN PHOSPHATE 600 MG/50ML IV SOLN
600.0000 mg | Freq: Four times a day (QID) | INTRAVENOUS | Status: AC
  Administered 2018-10-29 (×2): 600 mg via INTRAVENOUS
  Filled 2018-10-29 (×2): qty 50

## 2018-10-29 MED ORDER — GLYCOPYRROLATE PF 0.2 MG/ML IJ SOSY
PREFILLED_SYRINGE | INTRAMUSCULAR | Status: AC
  Filled 2018-10-29: qty 1

## 2018-10-29 MED ORDER — PHENOL 1.4 % MT LIQD: 1.0000 | OROMUCOSAL | Status: DC | PRN

## 2018-10-29 MED ORDER — ONDANSETRON HCL 4 MG PO TABS: 4.0000 mg | ORAL_TABLET | Freq: Four times a day (QID) | ORAL | Status: DC | PRN

## 2018-10-29 MED ORDER — HYDROCHLOROTHIAZIDE 25 MG PO TABS
25.0000 mg | ORAL_TABLET | Freq: Every day | ORAL | Status: DC
  Administered 2018-10-29 – 2018-10-31 (×3): 25 mg via ORAL
  Filled 2018-10-29 (×4): qty 1

## 2018-10-29 MED ORDER — EPINEPHRINE PF 1 MG/10ML IJ SOSY
PREFILLED_SYRINGE | INTRAMUSCULAR | Status: DC | PRN
  Administered 2018-10-29: 0.1 mg via INTRAVENOUS

## 2018-10-29 MED ORDER — MENTHOL 3 MG MT LOZG: 1.0000 | LOZENGE | OROMUCOSAL | Status: DC | PRN

## 2018-10-29 MED ORDER — DOCUSATE SODIUM 100 MG PO CAPS
100.0000 mg | ORAL_CAPSULE | Freq: Two times a day (BID) | ORAL | Status: DC
  Administered 2018-10-29 – 2018-10-31 (×5): 100 mg via ORAL
  Filled 2018-10-29 (×5): qty 1

## 2018-10-29 MED ORDER — SODIUM CHLORIDE 0.9 % IV SOLN
INTRAVENOUS | Status: DC | PRN
  Administered 2018-10-29: 60 mL

## 2018-10-29 MED ORDER — PROPOFOL 500 MG/50ML IV EMUL
INTRAVENOUS | Status: DC | PRN
  Administered 2018-10-29: 09:00:00 via INTRAVENOUS
  Administered 2018-10-29: 35 ug/kg/min via INTRAVENOUS
  Administered 2018-10-29: 09:00:00 via INTRAVENOUS

## 2018-10-29 MED ORDER — METHOCARBAMOL 1000 MG/10ML IJ SOLN: 500.0000 mg | Freq: Four times a day (QID) | INTRAVENOUS | Status: DC | PRN

## 2018-10-29 MED ORDER — CELECOXIB 100 MG PO CAPS
200.0000 mg | ORAL_CAPSULE | Freq: Two times a day (BID) | ORAL | Status: DC
  Administered 2018-10-29 – 2018-10-31 (×5): 200 mg via ORAL
  Filled 2018-10-29 (×5): qty 2

## 2018-10-29 MED ORDER — LACTATED RINGERS IV SOLN: INTRAVENOUS | Status: DC

## 2018-10-29 MED ORDER — ONDANSETRON HCL 4 MG/2ML IJ SOLN: 4.0000 mg | Freq: Four times a day (QID) | INTRAMUSCULAR | Status: DC | PRN

## 2018-10-29 MED ORDER — BUPIVACAINE-EPINEPHRINE (PF) 0.25% -1:200000 IJ SOLN
INTRAMUSCULAR | Status: DC | PRN
  Administered 2018-10-29: 20 mL via PERINEURAL

## 2018-10-29 MED ORDER — VANCOMYCIN HCL 10 G IV SOLR
1500.0000 mg | INTRAVENOUS | Status: DC
  Filled 2018-10-29: qty 1500

## 2018-10-29 MED ORDER — HYDROCODONE-ACETAMINOPHEN 7.5-325 MG PO TABS
1.0000 | ORAL_TABLET | ORAL | Status: DC | PRN
  Administered 2018-10-29 – 2018-10-30 (×4): 1 via ORAL
  Administered 2018-10-31: 2 via ORAL
  Filled 2018-10-29 (×4): qty 1
  Filled 2018-10-29: qty 2

## 2018-10-29 MED ORDER — FENTANYL CITRATE (PF) 100 MCG/2ML IJ SOLN
INTRAMUSCULAR | Status: DC | PRN
  Administered 2018-10-29: 25 ug via INTRAVENOUS
  Administered 2018-10-29: 25 ug via INTRATHECAL
  Administered 2018-10-29 (×2): 25 ug via INTRAVENOUS

## 2018-10-29 MED ORDER — CLINDAMYCIN PHOSPHATE 600 MG/50ML IV SOLN
INTRAVENOUS | Status: AC
  Filled 2018-10-29: qty 50

## 2018-10-29 MED ORDER — FENTANYL CITRATE (PF) 100 MCG/2ML IJ SOLN
INTRAMUSCULAR | Status: AC
  Filled 2018-10-29: qty 2

## 2018-10-29 MED ORDER — MEPERIDINE HCL 50 MG/ML IJ SOLN: 6.2500 mg | INTRAMUSCULAR | Status: DC | PRN

## 2018-10-29 MED ORDER — EPINEPHRINE PF 1 MG/10ML IJ SOSY
PREFILLED_SYRINGE | INTRAMUSCULAR | Status: AC
  Filled 2018-10-29: qty 10

## 2018-10-29 MED ORDER — DIPHENHYDRAMINE HCL 50 MG/ML IJ SOLN
INTRAMUSCULAR | Status: AC
  Filled 2018-10-29: qty 1

## 2018-10-29 MED ORDER — MIDAZOLAM HCL 5 MG/5ML IJ SOLN
INTRAMUSCULAR | Status: DC | PRN
  Administered 2018-10-29: 2 mg via INTRAVENOUS
  Administered 2018-10-29 (×2): 1 mg via INTRAVENOUS

## 2018-10-29 MED ORDER — ALUM & MAG HYDROXIDE-SIMETH 200-200-20 MG/5ML PO SUSP: 30.0000 mL | ORAL | Status: DC | PRN

## 2018-10-29 MED ORDER — ONDANSETRON HCL 4 MG/2ML IJ SOLN
4.0000 mg | Freq: Once | INTRAMUSCULAR | Status: AC
  Administered 2018-10-29: 4 mg via INTRAVENOUS
  Filled 2018-10-29: qty 2

## 2018-10-29 MED ORDER — DIPHENHYDRAMINE HCL 12.5 MG/5ML PO ELIX: 12.5000 mg | ORAL_SOLUTION | ORAL | Status: DC | PRN

## 2018-10-29 MED ORDER — SODIUM CHLORIDE (PF) 0.9 % IJ SOLN
INTRAMUSCULAR | Status: AC
  Filled 2018-10-29: qty 40

## 2018-10-29 MED ORDER — VALACYCLOVIR HCL 500 MG PO TABS
1000.0000 mg | ORAL_TABLET | Freq: Every day | ORAL | Status: DC
  Administered 2018-10-29 – 2018-10-30 (×2): 1000 mg via ORAL
  Filled 2018-10-29 (×2): qty 2

## 2018-10-29 MED ORDER — PROMETHAZINE HCL 25 MG/ML IJ SOLN: 6.2500 mg | INTRAMUSCULAR | Status: DC | PRN

## 2018-10-29 MED ORDER — DEXAMETHASONE SODIUM PHOSPHATE 10 MG/ML IJ SOLN
INTRAMUSCULAR | Status: AC
  Filled 2018-10-29: qty 1

## 2018-10-29 MED ORDER — MORPHINE SULFATE (PF) 2 MG/ML IV SOLN
0.5000 mg | INTRAVENOUS | Status: DC | PRN
  Administered 2018-10-29: 0.5 mg via INTRAVENOUS
  Administered 2018-10-29 (×2): 1 mg via INTRAVENOUS
  Administered 2018-10-30: 0.5 mg via INTRAVENOUS
  Administered 2018-10-31: 1 mg via INTRAVENOUS
  Filled 2018-10-29 (×5): qty 1

## 2018-10-29 MED ORDER — PREGABALIN 50 MG PO CAPS
50.0000 mg | ORAL_CAPSULE | Freq: Once | ORAL | Status: AC
  Administered 2018-10-29: 50 mg via ORAL
  Filled 2018-10-29: qty 1

## 2018-10-29 MED ORDER — DIPHENHYDRAMINE HCL 50 MG/ML IJ SOLN
INTRAMUSCULAR | Status: DC | PRN
  Administered 2018-10-29: 50 mg via INTRAVENOUS

## 2018-10-29 MED ORDER — PROPOFOL 10 MG/ML IV BOLUS
INTRAVENOUS | Status: AC
  Filled 2018-10-29: qty 40

## 2018-10-29 MED ORDER — ADULT MULTIVITAMIN W/MINERALS CH
1.0000 | ORAL_TABLET | Freq: Every day | ORAL | Status: DC
  Administered 2018-10-29 – 2018-10-31 (×3): 1 via ORAL
  Filled 2018-10-29 (×3): qty 1

## 2018-10-29 MED ORDER — ONDANSETRON HCL 4 MG/2ML IJ SOLN
INTRAMUSCULAR | Status: AC
  Filled 2018-10-29: qty 2

## 2018-10-29 MED ORDER — DICLOFENAC SODIUM 75 MG PO TBEC: 75.0000 mg | DELAYED_RELEASE_TABLET | Freq: Two times a day (BID) | ORAL | Status: DC

## 2018-10-29 MED ORDER — CLINDAMYCIN PHOSPHATE 900 MG/50ML IV SOLN
INTRAVENOUS | Status: DC | PRN
  Administered 2018-10-29: 900 mg via INTRAVENOUS

## 2018-10-29 MED ORDER — DICLOFENAC SODIUM 2 % TD SOLN: 2.0000 | Freq: Two times a day (BID) | TRANSDERMAL | Status: DC | PRN

## 2018-10-29 MED ORDER — PRAMIPEXOLE DIHYDROCHLORIDE 0.25 MG PO TABS
0.2500 mg | ORAL_TABLET | Freq: Every day | ORAL | Status: DC
  Administered 2018-10-29 – 2018-10-30 (×2): 0.25 mg via ORAL
  Filled 2018-10-29 (×3): qty 1

## 2018-10-29 SURGICAL SUPPLY — 71 items
BANDAGE ELASTIC 4 LF NS (GAUZE/BANDAGES/DRESSINGS) ×6 IMPLANT
BANDAGE ELASTIC 6 LF NS (GAUZE/BANDAGES/DRESSINGS) ×3 IMPLANT
BANDAGE ESMARK 6X9 LF (GAUZE/BANDAGES/DRESSINGS) ×1 IMPLANT
BIT DRILL 3.2X128 (BIT) ×1 IMPLANT
BIT DRILL 3.2X128MM (BIT) ×1
BLADE HEX COATED 2.75 (ELECTRODE) ×3 IMPLANT
BLADE SAGITTAL 25.0X1.27X90 (BLADE) ×2 IMPLANT
BLADE SAGITTAL 25.0X1.27X90MM (BLADE) ×1
BNDG ESMARK 6X9 LF (GAUZE/BANDAGES/DRESSINGS) ×3
CEMENT HV SMART SET (Cement) ×6 IMPLANT
CLOTH BEACON ORANGE TIMEOUT ST (SAFETY) ×3 IMPLANT
COOLER CRYO CUFF IC AND MOTOR (MISCELLANEOUS) ×3 IMPLANT
COVER LIGHT HANDLE STERIS (MISCELLANEOUS) ×6 IMPLANT
CUFF CRYO KNEE LG 20X31 COOLER (ORTHOPEDIC SUPPLIES) ×2 IMPLANT
CUFF CRYO KNEE18X23 MED (MISCELLANEOUS) IMPLANT
CUFF TOURNIQUET SINGLE 34IN LL (TOURNIQUET CUFF) ×2 IMPLANT
DECANTER SPIKE VIAL GLASS SM (MISCELLANEOUS) ×4 IMPLANT
DRAPE BACK TABLE (DRAPES) ×3 IMPLANT
DRAPE EXTREMITY T 121X128X90 (DISPOSABLE) ×3 IMPLANT
DRSG MEPILEX BORDER 4X12 (GAUZE/BANDAGES/DRESSINGS) ×3 IMPLANT
DURAPREP 26ML APPLICATOR (WOUND CARE) ×6 IMPLANT
ELECT REM PT RETURN 9FT ADLT (ELECTROSURGICAL) ×3
ELECTRODE REM PT RTRN 9FT ADLT (ELECTROSURGICAL) ×1 IMPLANT
EVACUATOR 3/16  PVC DRAIN (DRAIN)
EVACUATOR 3/16 PVC DRAIN (DRAIN) ×1 IMPLANT
FEMUR RIGHT SZ 3 (Knees) ×2 IMPLANT
GLOVE BIO SURGEON STRL SZ7 (GLOVE) ×10 IMPLANT
GLOVE BIOGEL PI IND STRL 7.0 (GLOVE) ×2 IMPLANT
GLOVE BIOGEL PI INDICATOR 7.0 (GLOVE) ×8
GLOVE SKINSENSE NS SZ8.0 LF (GLOVE) ×4
GLOVE SKINSENSE STRL SZ8.0 LF (GLOVE) ×2 IMPLANT
GLOVE SS N UNI LF 8.5 STRL (GLOVE) ×3 IMPLANT
GOWN STRL REUS W/ TWL LRG LVL3 (GOWN DISPOSABLE) ×1 IMPLANT
GOWN STRL REUS W/TWL LRG LVL3 (GOWN DISPOSABLE) ×8 IMPLANT
GOWN STRL REUS W/TWL XL LVL3 (GOWN DISPOSABLE) ×3 IMPLANT
HANDPIECE INTERPULSE COAX TIP (DISPOSABLE) ×2
HOOD W/PEELAWAY (MISCELLANEOUS) ×12 IMPLANT
INSERT 2.5X12.5MM (Knees) ×2 IMPLANT
INST SET MAJOR BONE (KITS) ×3 IMPLANT
IV NS IRRIG 3000ML ARTHROMATIC (IV SOLUTION) ×3 IMPLANT
KIT BLADEGUARD II DBL (SET/KITS/TRAYS/PACK) ×3 IMPLANT
KIT TURNOVER CYSTO (KITS) ×3 IMPLANT
MANIFOLD NEPTUNE II (INSTRUMENTS) ×3 IMPLANT
MARKER SKIN DUAL TIP RULER LAB (MISCELLANEOUS) ×3 IMPLANT
NDL HYPO 21X1.5 SAFETY (NEEDLE) ×1 IMPLANT
NEEDLE HYPO 21X1.5 SAFETY (NEEDLE) ×3 IMPLANT
NS IRRIG 1000ML POUR BTL (IV SOLUTION) ×3 IMPLANT
PACK TOTAL JOINT (CUSTOM PROCEDURE TRAY) ×3 IMPLANT
PAD ARMBOARD 7.5X6 YLW CONV (MISCELLANEOUS) ×3 IMPLANT
PAD DANNIFLEX CPM (ORTHOPEDIC SUPPLIES) ×3 IMPLANT
PATELLA DOME PFC 35MM (Knees) ×2 IMPLANT
PILLOW KNEE EXTENSION 0 DEG (MISCELLANEOUS) ×3 IMPLANT
PIN STEINMAN FIXATION KNEE (PIN) ×2 IMPLANT
PIN THREADED HEADED SIGMA (PIN) ×2 IMPLANT
SAW OSC TIP CART 19.5X105X1.3 (SAW) ×3 IMPLANT
SET BASIN LINEN APH (SET/KITS/TRAYS/PACK) ×3 IMPLANT
SET HNDPC FAN SPRY TIP SCT (DISPOSABLE) ×1 IMPLANT
STAPLER VISISTAT 35W (STAPLE) ×3 IMPLANT
SUT BRALON NAB BRD #1 30IN (SUTURE) ×6 IMPLANT
SUT MNCRL 0 VIOLET CTX 36 (SUTURE) ×1 IMPLANT
SUT MON AB 0 CT1 (SUTURE) ×3 IMPLANT
SUT MON AB 2-0 CT1 36 (SUTURE) IMPLANT
SUT MONOCRYL 0 CTX 36 (SUTURE) ×2
SYR BULB IRRIGATION 50ML (SYRINGE) ×2 IMPLANT
SYRINGE 20CC LL (MISCELLANEOUS) ×15 IMPLANT
TOWEL OR 17X26 4PK STRL BLUE (TOWEL DISPOSABLE) ×3 IMPLANT
TOWER CARTRIDGE SMART MIX (DISPOSABLE) ×3 IMPLANT
TRAY FOLEY MTR SLVR 16FR STAT (SET/KITS/TRAYS/PACK) ×3 IMPLANT
TRAY TIB MOD SZ 2.5 (Knees) ×2 IMPLANT
WATER STERILE IRR 1000ML POUR (IV SOLUTION) ×6 IMPLANT
YANKAUER SUCT 12FT TUBE ARGYLE (SUCTIONS) ×5 IMPLANT

## 2018-10-29 NOTE — Anesthesia Procedure Notes (Signed)
Spinal  Patient location during procedure: OR Start time: 10/29/2018 7:34 AM End time: 10/29/2018 7:37 AM Staffing Anesthesiologist: Vena Rua, MD Performed: anesthesiologist  Preanesthetic Checklist Completed: patient identified, site marked, surgical consent, pre-op evaluation, timeout performed, IV checked, risks and benefits discussed and monitors and equipment checked Spinal Block Patient position: sitting Prep: Betadine Patient monitoring: heart rate Approach: midline Location: L3-4 Injection technique: single-shot Needle Needle type: Pencan  Needle gauge: 24 G Needle length: 9 cm Needle insertion depth: 7 cm Assessment Sensory level: T8

## 2018-10-29 NOTE — Op Note (Signed)
10/29/2018  9:46 AM  PATIENT:  Amy Mooney  53 y.o. female  PRE-OPERATIVE DIAGNOSIS:  right knee osteoarthritis  POST-OPERATIVE DIAGNOSIS:  right knee osteoarthritis  PROCEDURE:  Procedure(s): TOTAL KNEE ARTHROPLASTY (Right) [phone]  Findings at surgery multiple osteophytes extensive contracture of the posterior medial and anteromedial capsule creating internal rotation deformity as well as moderate varus deformity grade 4 chondral changes of the medial femoral condyle ACL and PCL were intact with a stenotic notch lateral meniscus was diminutive medial meniscus was intact  Preop range of motion under anesthesia 5-110 degrees  Implants: Depuy sigma tka: 39F 2.5 tibia 12.5 poly-38 patella fixed-bearing posterior stabilized   Surgeon Aline Brochure [phone]   Antibiotic initially with vancomycin patient had a severe reaction was treated with Benadryl and Decadron  We switched to 900 mg of clindamycin   The patient was identified in the preop holding area and the surgical site was confirmed as the right knee. Chart review and update were completed. The patient was taken to the operating room for spinal anesthesia. After successful spinal anesthesia Foley catheter was inserted. The patient was placed supine on the operating table.   the right leg was prepped with DuraPrep and draped sterilely. Timeout was completed. The limb was then exsanguinated a  6 inch Esmarch. The tourniquet was elevated to 300 mmHg.   A midline incision was made and taken down to the extensor mechanism followed by medial arthrotomy. The patella was everted. A synovectomy was performed as needed. The osteophytes were resected.  Anterior cruciate ligament and PCL and medial and lateral meniscus were resected.   a 3/8 inch drill bit was used to enter the femoral canal which was suctioned and irrigated until the fluid was clear. The distal femoral cut was set for 11 millimeter resection with a 5  Right Valgus angle. This  cut was completed and checked for flatness.   the femur was then measured to a size 3.    the tibia was subluxated forward and the external alignment guide was placed. We removed 10 mm of bone from the higher lateral side. We set the guide for neutral varus valgus cut related to the  Mechanical axis of the tibia and for slope matching the patient's anatomy. Rotational alignment was set using the tibial tubercle, tibial spine and second metatarsal. The cutting block was pinned and the proximal tibia was resected.   A spacer block was placed starting with a 10 mm insert to check the extension gap.  The lateral side was still tight and therefore the medial side was released including the posterior medial capsule and superficial MCL   The 4-in-1 femoral cutting block was placed to match the femoral epicondyles and the 4 distal cuts were made.   A spacer block was placed starting with a 10 mm insert to check the flexion and extension gap. The 12.5 spacer block confirmed proper balance in flexion and extension.   We placed the femoral notch cutting guide size 3  and resected the notch.   Trial implants were placed using appropriate size femur , appropriate size tibial baseplate which was measured after the proximal tibia resection. Tibial rotation was set patella tracking was normal   The tibia was then punched per manufacture technique making sure to avoid internal rotation.   The patella measured a size 22   We resected down to a size 12 using a size 38 mm button button. Total patella thickness was 22.5   Final range of motion check  was performed with the appropriate size trials as mentioned above. Satisfactory reduction and motion were obtained.   Trial implants were removed. The bone was irrigated and dried and the cement was mixed on the back table  exparel was injected in the soft tissues and posterior capsule of the knee  These implants were then cemented in place. Excess cement was  removed. The cement was allowed to cure. Second irrigation was performed.    FInal range of motion check and stability check was completed  The wound was irrigated  a third time , THE extensor mechanism was closed with #1 Nurolon followed by 0 Monocryl and staples to reapproximate the skin edges and subcutaneous tissue.   Sterile dressing and cryocuff  was applied  The patient was taken recovery in stable condition   SURGEON:  Surgeon(s) and Role:    * Carole Civil, MD - Primary  Assisted by Simonne Maffucci  Spinal anesthetic  Blood loss minimal  No blood administered  No drains  Exparel 20 and Marcaine with epinephrine 20 injected  No specimen   Sponge and needle count correct tOURNIQUET:   Total Tourniquet Time Documented: Thigh (Right) - 83 minutes Total: Thigh (Right) - 83 minutes   DICTATION: .Viviann Spare Dictation  PLAN OF CARE: Admit for overnight observation  PATIENT DISPOSITION:  PACU - hemodynamically stable.   Delay start of Pharmacological VTE agent (>24hrs) due to surgical blood loss or risk of bleeding: yes

## 2018-10-29 NOTE — Anesthesia Preprocedure Evaluation (Signed)
Anesthesia Evaluation    Airway Mallampati: II       Dental  (+) Teeth Intact   Pulmonary sleep apnea , former smoker,    breath sounds clear to auscultation       Cardiovascular hypertension, On Medications  Rhythm:regular     Neuro/Psych  Headaches,    GI/Hepatic   Endo/Other    Renal/GU      Musculoskeletal   Abdominal   Peds  Hematology   Anesthesia Other Findings Morbid obesity  Reproductive/Obstetrics                             Anesthesia Physical Anesthesia Plan  ASA: III  Anesthesia Plan: Spinal   Post-op Pain Management:    Induction:   PONV Risk Score and Plan:   Airway Management Planned:   Additional Equipment:   Intra-op Plan:   Post-operative Plan:   Informed Consent: I have reviewed the patients History and Physical, chart, labs and discussed the procedure including the risks, benefits and alternatives for the proposed anesthesia with the patient or authorized representative who has indicated his/her understanding and acceptance.     Plan Discussed with: Anesthesiologist  Anesthesia Plan Comments:         Anesthesia Quick Evaluation

## 2018-10-29 NOTE — Progress Notes (Signed)
Patient placed in CPM machine. Patient removed from CPM machine at 2120 patient tolerated well. Patient placed in bone foam. Patient complaint of pain with use of bone foam. Educated patient on the purpose of bone foam and gave patient prn pain medication. Will continue to monitor throughout shift.

## 2018-10-29 NOTE — Plan of Care (Signed)
  Problem: Acute Rehab PT Goals(only PT should resolve) Goal: Pt Will Go Supine/Side To Sit Outcome: Progressing Flowsheets (Taken 10/29/2018 1704) Pt will go Supine/Side to Sit: with min guard assist Goal: Patient Will Transfer Sit To/From Stand Outcome: Progressing Flowsheets (Taken 10/29/2018 1704) Patient will transfer sit to/from stand: with supervision Goal: Pt Will Transfer Bed To Chair/Chair To Bed Outcome: Progressing Flowsheets (Taken 10/29/2018 1704) Pt will Transfer Bed to Chair/Chair to Bed: with supervision Goal: Pt Will Ambulate Outcome: Progressing Flowsheets (Taken 10/29/2018 1704) Pt will Ambulate: 100 feet; with supervision; with rolling walker   5:05 PM, 10/29/18 Lonell Grandchild, MPT Physical Therapist with [facility] [phone] office [phone] mobile phone

## 2018-10-29 NOTE — Clinical Social Work Note (Signed)
Received CSW referral for SNF placement. This appears to be one of Dr. Ruthe Mannan standing orders for TKA patients. It appears likely pt will return home with [facility] PT at dc. CSW will follow and assist if pt ends up needing SNF rehab.

## 2018-10-29 NOTE — Anesthesia Postprocedure Evaluation (Signed)
Anesthesia Post Note Late Entry for 1035  Patient: Amy Mooney  Procedure(s) Performed: TOTAL KNEE ARTHROPLASTY (Right Knee)  Patient location during evaluation: PACU Anesthesia Type: Spinal Level of consciousness: awake and alert and oriented Pain management: pain level controlled Vital Signs Assessment: post-procedure vital signs reviewed and stable Respiratory status: spontaneous breathing Cardiovascular status: stable Postop Assessment: no apparent nausea or vomiting Anesthetic complications: no     Last Vitals:  Vitals:   10/29/18 1045 10/29/18 1100  BP: 126/77 124/77  Pulse: 72 68  Resp: 11 13  Temp:    SpO2: 98% 99%    Last Pain:  Vitals:   10/29/18 1100  PainSc: 3                  Claudette Wermuth A

## 2018-10-29 NOTE — Anesthesia Procedure Notes (Signed)
Procedure Name: MAC Date/Time: 10/29/2018 7:45 AM Performed by: Andree Elk Kavir Savoca A, CRNA Pre-anesthesia Checklist: Patient identified, Emergency Drugs available, Suction available, Timeout performed and Patient being monitored Patient Re-evaluated:Patient Re-evaluated prior to induction Oxygen Delivery Method: Non-rebreather mask

## 2018-10-29 NOTE — Interval H&P Note (Signed)
History and Physical Interval Note:  10/29/2018 7:22 AM  Amy Mooney  has presented today for surgery, with the diagnosis of right knee osteoarthritis  The various methods of treatment have been discussed with the patient and family. After consideration of risks, benefits and other options for treatment, the patient has consented to  Procedure(s): TOTAL KNEE ARTHROPLASTY (Right) as a surgical intervention .  The patient's history has been reviewed, patient examined, no change in status, stable for surgery.  I have reviewed the patient's chart and labs.  Questions were answered to the patient's satisfaction.     Arther Abbott

## 2018-10-29 NOTE — Clinical Social Work Note (Signed)
CSW met with patient.  Also in room were husband Amy Mooney and daughter Amy Mooney.  Pt was adamant that she would be returning home in [place] from here.  Stated that she already has walker, bedside commode and shower chair that she inherited from her grandfather who was staying with them before he passed.  Husband states he works from home and so is readily available, and patient states her sister is coming from out of town for a week to help upon transfer home from hospital.  Pt does not have a preference for [facility] agency.  CSW sign off.

## 2018-10-29 NOTE — Brief Op Note (Addendum)
10/29/2018  9:46 AM  PATIENT:  Amy Mooney  53 y.o. female  PRE-OPERATIVE DIAGNOSIS:  right knee osteoarthritis  POST-OPERATIVE DIAGNOSIS:  right knee osteoarthritis  PROCEDURE:  Procedure(s): TOTAL KNEE ARTHROPLASTY (Right) 27447   depuy sigma tka: 74F 2.5 tibia 12.5 poly-38 patella fixed-bearing posterior stabilized  SURGEON:  Surgeon(s) and Role:    Carole Civil, MD - Primary  Assisted by Simonne Maffucci  Spinal anesthetic  Blood loss minimal  No blood administered  No drains  Exparel 20 and Marcaine with epinephrine 20 injected  No specimen   Sponge and needle count correct tOURNIQUET:   Total Tourniquet Time Documented: Thigh (Right) - 83 minutes Total: Thigh (Right) - 83 minutes   DICTATION: .Viviann Spare Dictation  PLAN OF CARE: Admit for overnight observation  PATIENT DISPOSITION:  PACU - hemodynamically stable.   Delay start of Pharmacological VTE agent (>24hrs) due to surgical blood loss or risk of bleeding: yes

## 2018-10-29 NOTE — Transfer of Care (Signed)
Immediate Anesthesia Transfer of Care Note  Patient: Amy Mooney  Procedure(s) Performed: TOTAL KNEE ARTHROPLASTY (Right Knee)  Patient Location: PACU  Anesthesia Type:MAC and Spinal  Level of Consciousness: awake, alert , oriented and patient cooperative  Airway & Oxygen Therapy: Patient Spontanous Breathing  Post-op Assessment: Report given to RN and Post -op Vital signs reviewed and stable  Post vital signs: Reviewed and stable  Last Vitals:  Vitals Value Taken Time  BP 116/66 10/29/2018  9:49 AM  Temp    Pulse 83 10/29/2018  9:52 AM  Resp 15 10/29/2018  9:52 AM  SpO2 97 % 10/29/2018  9:52 AM  Vitals shown include unvalidated device data.  Last Pain:  Vitals:   10/29/18 0640  PainSc: 0-No pain         Complications: No apparent anesthesia complications

## 2018-10-29 NOTE — Evaluation (Signed)
Physical Therapy Evaluation Patient Details Name: Amy Mooney MRN: [id number] DOB: 1966/09/21 Today's Date: 10/29/2018  RIGHT KNEE ROM:  11-69 degrees AMBULATION DISTANCE: 25 feet using RW with Min guard assist   History of Present Illness  Amy Mooney is a 53 y/o female, s/p Right TKA, 10/29/18 with the diagnosis of right knee osteoarthritis    Clinical Impression  Patient instructed in HEP with fair return demonstrated secondary to c/o increased right knee pain with movement.  Patient requires assistance to move RLE when sitting up at bedside due to weakness/pain, able to ambulate out side of room, but mostly limited for gait training due to c/o dizziness possibly due to pain medication per patient.  Patient tolerated sitting up in chair after therapy - RN aware.  Patient will benefit from continued physical therapy in hospital and recommended venue below to increase strength, balance, endurance for safe ADLs and gait.    Follow Up Recommendations Home health PT;Supervision for mobility/OOB;Supervision - Intermittent    Equipment Recommendations  None recommended by PT    Recommendations for Other Services       Precautions / Restrictions Precautions Precautions: Fall Precaution Comments: s/p Right TKA Restrictions Weight Bearing Restrictions: Yes RLE Weight Bearing: Weight bearing as tolerated      Mobility  Bed Mobility Overal bed mobility: Needs Assistance Bed Mobility: Supine to Sit     Supine to sit: Min assist     General bed mobility comments: to help move RLE  Transfers Overall transfer level: Needs assistance Equipment used: Rolling walker (2 wheeled) Transfers: Sit to/from [facility] Sit to Stand: Min guard Stand pivot transfers: Min guard       General transfer comment: verbal/tactile cueing for proper hand placement using RW  Ambulation/Gait Ambulation/Gait assistance: Min guard Gait Distance (Feet): 25 Feet Assistive device:  Rolling walker (2 wheeled) Gait Pattern/deviations: Decreased step length - right;Decreased stance time - right;Decreased stride length Gait velocity: slow   General Gait Details: slow labored cadence with limited weightbearing on RLE due to increased pain, limited mostly due to c/o dizziness possibly due pain medication per patient  Stairs            Wheelchair Mobility    Modified Rankin (Stroke Patients Only)       Balance Overall balance assessment: Needs assistance Sitting-balance support: Feet supported;No upper extremity supported Sitting balance-Leahy Scale: Good     Standing balance support: Bilateral upper extremity supported;During functional activity Standing balance-Leahy Scale: Fair Standing balance comment: using RW                             Pertinent Vitals/Pain Pain Assessment: 0-10 Pain Score: 8  Pain Location: right knee with movement, 4/10 at rest Pain Descriptors / Indicators: Sharp;Discomfort;Grimacing;Guarding Pain Intervention(s): Limited activity within patient's tolerance;Monitored during session;Premedicated before session    Home Living Family/patient expects to be discharged to:: Private residence Living Arrangements: Spouse/significant other Available Help at Discharge: Family Type of Home: House Home Access: Ramped entrance     Home Layout: One level Home Equipment: Environmental consultant - 2 wheels;Bedside commode;Shower seat      Prior Function Level of Independence: Independent         Comments: Hydrographic surveyor, drives     Journalist, newspaper        Extremity/Trunk Assessment   Upper Extremity Assessment Upper Extremity Assessment: Overall WFL for tasks assessed    Lower Extremity Assessment Lower Extremity  Assessment: Generalized weakness;RLE deficits/detail RLE Deficits / Details: grossly 3/5 RLE: Unable to fully assess due to pain    Cervical / Trunk Assessment Cervical / Trunk Assessment: Normal   Communication   Communication: No difficulties  Cognition Arousal/Alertness: Awake/alert Behavior During Therapy: WFL for tasks assessed/performed Overall Cognitive Status: Within Functional Limits for tasks assessed                                        General Comments      Exercises Total Joint Exercises Ankle Circles/Pumps: Supine;AROM;Strengthening;Both;5 reps Quad Sets: Supine;AROM;Strengthening;Both;5 reps Gluteal Sets: Supine;AROM;Strengthening;Both;5 reps Heel Slides: Supine;AROM;Strengthening;Right;5 reps   Assessment/Plan    PT Assessment Patient needs continued PT services  PT Problem List Decreased strength;Decreased range of motion;Decreased activity tolerance;Decreased balance;Decreased mobility       PT Treatment Interventions Gait training;Stair training;Functional mobility training;Therapeutic activities;Patient/family education;Therapeutic exercise    PT Goals (Current goals can be found in the Care Plan section)  Acute Rehab PT Goals Patient Stated Goal: return home with family to assist PT Goal Formulation: With patient Time For Goal Achievement: 11/02/18 Potential to Achieve Goals: Good    Frequency 7X/week   Barriers to discharge        Co-evaluation               AM-PAC PT "6 Clicks" Mobility  Outcome Measure Help needed turning from your back to your side while in a flat bed without using bedrails?: A Lot(due to right knee pain) Help needed moving from lying on your back to sitting on the side of a flat bed without using bedrails?: Total(help to move RLE) Help needed moving to and from a bed to a chair (including a wheelchair)?: A Little(verbal cueing) Help needed standing up from a chair using your arms (e.g., wheelchair or bedside chair)?: A Little Help needed to walk in hospital room?: A Little Help needed climbing 3-5 steps with a railing? : A Lot 6 Click Score: 14    End of Session Equipment Utilized During  Treatment: Gait belt Activity Tolerance: Patient tolerated treatment well;Patient limited by fatigue(Patient limited by dizziness) Patient left: in chair;with call bell/phone within reach;with nursing/sitter in room Nurse Communication: Mobility status PT Visit Diagnosis: Unsteadiness on feet (R26.81);Other abnormalities of gait and mobility (R26.89);Muscle weakness (generalized) (M62.81)    Time: 3009-2330 PT Time Calculation (min) (ACUTE ONLY): 35 min   Charges:   PT Evaluation $PT Eval Moderate Complexity: 1 Mod PT Treatments $Therapeutic Activity: 23-37 mins        5:03 PM, 10/29/18 Lonell Grandchild, MPT Physical Therapist with [facility] [phone] office [phone] mobile phone

## 2018-10-30 DIAGNOSIS — E559 Vitamin D deficiency, unspecified: Secondary | ICD-10-CM | POA: Diagnosis not present

## 2018-10-30 DIAGNOSIS — G43909 Migraine, unspecified, not intractable, without status migrainosus: Secondary | ICD-10-CM | POA: Diagnosis not present

## 2018-10-30 DIAGNOSIS — G47419 Narcolepsy without cataplexy: Secondary | ICD-10-CM | POA: Diagnosis not present

## 2018-10-30 DIAGNOSIS — M1711 Unilateral primary osteoarthritis, right knee: Secondary | ICD-10-CM | POA: Diagnosis not present

## 2018-10-30 DIAGNOSIS — G4730 Sleep apnea, unspecified: Secondary | ICD-10-CM | POA: Diagnosis not present

## 2018-10-30 DIAGNOSIS — Z87442 Personal history of urinary calculi: Secondary | ICD-10-CM | POA: Diagnosis not present

## 2018-10-30 DIAGNOSIS — G2581 Restless legs syndrome: Secondary | ICD-10-CM | POA: Diagnosis not present

## 2018-10-30 DIAGNOSIS — Z6841 Body Mass Index (BMI) 40.0 and over, adult: Secondary | ICD-10-CM | POA: Diagnosis not present

## 2018-10-30 LAB — CBC
HCT: 33.7 % — ABNORMAL LOW (ref 36.0–46.0)
Hemoglobin: 10.5 g/dL — ABNORMAL LOW (ref 12.0–15.0)
MCH: 27.1 pg (ref 26.0–34.0)
MCHC: 31.2 g/dL (ref 30.0–36.0)
MCV: 87.1 fL (ref 80.0–100.0)
Platelets: 215 10*3/uL (ref 150–400)
RBC: 3.87 MIL/uL (ref 3.87–5.11)
RDW: 13.8 % (ref 11.5–15.5)
WBC: 13.4 10*3/uL — ABNORMAL HIGH (ref 4.0–10.5)
nRBC: 0 % (ref 0.0–0.2)

## 2018-10-30 LAB — BASIC METABOLIC PANEL
Anion gap: 8 (ref 5–15)
BUN: 14 mg/dL (ref 6–20)
CO2: 26 mmol/L (ref 22–32)
Calcium: 8.3 mg/dL — ABNORMAL LOW (ref 8.9–10.3)
Chloride: 104 mmol/L (ref 98–111)
Creatinine, Ser: 0.82 mg/dL (ref 0.44–1.00)
GFR calc Af Amer: >60 mL/min (ref >60)
Glucose, Bld: 124 mg/dL — ABNORMAL HIGH (ref 70–99)
Potassium: 3.1 mmol/L — ABNORMAL LOW (ref 3.5–5.1)
SODIUM: 138 mmol/L (ref 135–145)

## 2018-10-30 MED ORDER — SODIUM CHLORIDE 0.9 % IV BOLUS
500.0000 mL | Freq: Once | INTRAVENOUS | Status: AC
  Administered 2018-10-30: 500 mL via INTRAVENOUS

## 2018-10-30 MED ORDER — POTASSIUM CHLORIDE CRYS ER 20 MEQ PO TBCR
20.0000 meq | EXTENDED_RELEASE_TABLET | Freq: Two times a day (BID) | ORAL | Status: DC
  Administered 2018-10-30 – 2018-10-31 (×3): 20 meq via ORAL
  Filled 2018-10-30 (×3): qty 1

## 2018-10-30 NOTE — Progress Notes (Signed)
Physical Therapy Treatment Patient Details Name: Amy Mooney MRN: [id number] DOB: Nov 23, 1965 Today's Date: 10/30/2018  RIGHT KNEE ROM:  2-71 degrees AMBULATION DISTANCE: 120 feet using RW with Supervison CPM PROM: 0-60 degrees    History of Present Illness Amy Mooney is a 53 y/o female, s/p Right TKA, 10/29/18 with the diagnosis of right knee osteoarthritis      PT Comments    Patient required pain medication prior to treatment due to c/o severe pain right knee and cramping in back of right knee and calf.  Patient required assistance to move RLE during bed mobility and for completing sit to stands, once on feet patient demonstrates increased endurance/distance for gait training with fair return for right heel to toe stepping due to right knee pain.  Patient tolerated sitting up in chair after therapy with RLE dangling.  Patient will benefit from continued physical therapy in hospital and recommended venue below to increase strength, balance, endurance for safe ADLs and gait.   Follow Up Recommendations  Home health PT;Supervision for mobility/OOB;Supervision - Intermittent     Equipment Recommendations  None recommended by PT    Recommendations for Other Services       Precautions / Restrictions Precautions Precautions: Fall Precaution Comments: s/p Right TKA Restrictions Weight Bearing Restrictions: Yes RLE Weight Bearing: Weight bearing as tolerated    Mobility  Bed Mobility Overal bed mobility: Needs Assistance Bed Mobility: Supine to Sit     Supine to sit: Min assist     General bed mobility comments: to help move RLE  Transfers Overall transfer level: Needs assistance Equipment used: Rolling walker (2 wheeled) Transfers: Sit to/from [facility] Sit to Stand: Min guard Stand pivot transfers: Supervision       General transfer comment: demonstrates improvement for body mechanic and hand placement during sit to  stands/transfers  Ambulation/Gait Ambulation/Gait assistance: Supervision Gait Distance (Feet): 120 Feet Assistive device: Rolling walker (2 wheeled) Gait Pattern/deviations: Decreased step length - right;Decreased stance time - right;Decreased stride length Gait velocity: decreased   General Gait Details: increased endurance/distance for ambulation with slow labored cadence without loss of balance, limited for right heel to toe stepping due to increased pain with weightbearing   Stairs             Wheelchair Mobility    Modified Rankin (Stroke Patients Only)       Balance Overall balance assessment: Needs assistance Sitting-balance support: Feet supported;No upper extremity supported Sitting balance-Leahy Scale: Good     Standing balance support: Bilateral upper extremity supported;During functional activity Standing balance-Leahy Scale: Fair Standing balance comment: using RW                            Cognition Arousal/Alertness: Awake/alert Behavior During Therapy: WFL for tasks assessed/performed Overall Cognitive Status: Within Functional Limits for tasks assessed                                        Exercises      General Comments        Pertinent Vitals/Pain Pain Assessment: 0-10 Pain Score: 3  Pain Location: 3/10 right knee at rest, c/o severe 10/10 with movement Pain Descriptors / Indicators: Sharp;Cramping;Sore;Grimacing;Crying;Guarding Pain Intervention(s): Limited activity within patient's tolerance;Monitored during session;Premedicated before session    Home Living Family/patient expects to be discharged to:: Private residence  Prior Function            PT Goals (current goals can now be found in the care plan section) Acute Rehab PT Goals Patient Stated Goal: return home with family to assist PT Goal Formulation: With patient Time For Goal Achievement: 11/02/18 Potential to  Achieve Goals: Good Progress towards PT goals: Progressing toward goals    Frequency    7X/week      PT Plan Current plan remains appropriate    Co-evaluation              AM-PAC PT "6 Clicks" Mobility   Outcome Measure  Help needed turning from your back to your side while in a flat bed without using bedrails?: A Lot(help to move RLE due to pain) Help needed moving from lying on your back to sitting on the side of a flat bed without using bedrails?: Total Help needed moving to and from a bed to a chair (including a wheelchair)?: None Help needed standing up from a chair using your arms (e.g., wheelchair or bedside chair)?: A Little Help needed to walk in hospital room?: A Little Help needed climbing 3-5 steps with a railing? : A Lot 6 Click Score: 15    End of Session Equipment Utilized During Treatment: Gait belt Activity Tolerance: Patient tolerated treatment well;Patient limited by fatigue Patient left: in chair;with call bell/phone within reach;with family/visitor present Nurse Communication: Mobility status PT Visit Diagnosis: Unsteadiness on feet (R26.81);Other abnormalities of gait and mobility (R26.89);Muscle weakness (generalized) (M62.81)     Time: 7416-3845 PT Time Calculation (min) (ACUTE ONLY): 27 min  Charges:  $Gait Training: 8-22 mins $Therapeutic Activity: 8-22 mins                     2:26 PM, 10/30/18 Lonell Grandchild, MPT Physical Therapist with [facility] [phone] office [phone] mobile phone

## 2018-10-30 NOTE — Progress Notes (Signed)
Patient ID: Amy Mooney, female   DOB: [birth year], 53 y.o.   MRN: [id number] BP 109/64 (BP Location: Left Arm)   Pulse 73   Temp 98.3 F (36.8 C) (Oral)   Resp 16   Ht 5\' 3"  (1.6 m)   Wt 109.5 kg   SpO2 94%   BMI 42.76 kg/m   Rt tka day 1 post op   CBC Latest Ref Rng & Units 10/30/2018 10/24/2018 08/19/2018  WBC 4.0 - 10.5 K/uL 13.4(H) 7.9 9.1  Hemoglobin 12.0 - 15.0 g/dL 10.5(L) 12.7 13.1  Hematocrit 36.0 - 46.0 % 33.7(L) 40.9 39.9  Platelets 150 - 400 K/uL 215 217 -   BMP Latest Ref Rng & Units 10/30/2018 10/24/2018 08/19/2018  Glucose 70 - 99 mg/dL 124(H) 88 83  BUN 6 - 20 mg/dL 14 21(H) 18  Creatinine 0.44 - 1.00 mg/dL 0.82 0.82 0.81  BUN/Creat Ratio 9 - 23 - - 22  Sodium 135 - 145 mmol/L 138 137 141  Potassium 3.5 - 5.1 mmol/L 3.1(L) 3.5 4.7  Chloride 98 - 111 mmol/L 104 101 100  CO2 22 - 32 mmol/L 26 29 23   Calcium 8.9 - 10.3 mg/dL 8.3(L) 9.6 9.2    Doing well stable   PT TODAY DC TOMORROW

## 2018-10-30 NOTE — Care Management Note (Signed)
Case Management Note  Patient Details  Name: Amy Mooney MRN: [id number] Date of Birth: 05/02/1966  Subjective/Objective:      Right TKA s/p day 1. From home with husband, who works from home and will be available 24/7 for supervision. Sister also available and will assist as needed.  Home Health referral has been given to [facility] perioperatively. Discussed with patient, she is agreeable to [facility].  [employer] has received CPM preoperatively and aware patient will DC Thursday- they will reach out to patient. Patient aware.  Patient has all needed DME at home pta ( [employer], RW, [facility], shower/transfer bench).    Action/Plan: DC home with [facility]. Tim of [facility] aware of DC Thursday.  Expected Discharge Date:  10/31/18               Expected Discharge Plan:  [place]  In-House Referral:  Clinical Social Work  Discharge planning Services  CM Consult  Post Acute Care Choice:  Home Health, Durable Medical Equipment Choice offered to:  Patient  DME Arranged:  Continuous passive motion machine DME Agency:  [employer]  HH Arranged:  PT HH Agency:  [facility] (now [facility])  Status of Service:  Completed, signed off  If discussed at [employer], dates discussed:    Additional Comments:  Lyndia Bury, Chauncey Reading, RN 10/30/2018, 10:30 AM

## 2018-10-31 ENCOUNTER — Telehealth: Payer: Self-pay | Admitting: Orthopedic Surgery

## 2018-10-31 ENCOUNTER — Encounter (HOSPITAL_COMMUNITY): Payer: Self-pay | Admitting: Orthopedic Surgery

## 2018-10-31 DIAGNOSIS — G4730 Sleep apnea, unspecified: Secondary | ICD-10-CM | POA: Diagnosis not present

## 2018-10-31 DIAGNOSIS — E559 Vitamin D deficiency, unspecified: Secondary | ICD-10-CM | POA: Diagnosis not present

## 2018-10-31 DIAGNOSIS — G43909 Migraine, unspecified, not intractable, without status migrainosus: Secondary | ICD-10-CM | POA: Diagnosis not present

## 2018-10-31 DIAGNOSIS — G2581 Restless legs syndrome: Secondary | ICD-10-CM | POA: Diagnosis not present

## 2018-10-31 DIAGNOSIS — M1711 Unilateral primary osteoarthritis, right knee: Secondary | ICD-10-CM | POA: Diagnosis not present

## 2018-10-31 DIAGNOSIS — Z96652 Presence of left artificial knee joint: Secondary | ICD-10-CM | POA: Diagnosis not present

## 2018-10-31 DIAGNOSIS — G47419 Narcolepsy without cataplexy: Secondary | ICD-10-CM | POA: Diagnosis not present

## 2018-10-31 DIAGNOSIS — M1712 Unilateral primary osteoarthritis, left knee: Secondary | ICD-10-CM | POA: Diagnosis not present

## 2018-10-31 DIAGNOSIS — Z6841 Body Mass Index (BMI) 40.0 and over, adult: Secondary | ICD-10-CM | POA: Diagnosis not present

## 2018-10-31 DIAGNOSIS — Z87442 Personal history of urinary calculi: Secondary | ICD-10-CM | POA: Diagnosis not present

## 2018-10-31 MED ORDER — PREGABALIN 50 MG PO CAPS: 50.0000 mg | ORAL_CAPSULE | Freq: Three times a day (TID) | ORAL | 0 refills | Status: DC

## 2018-10-31 MED ORDER — POTASSIUM CHLORIDE CRYS ER 20 MEQ PO TBCR: 20.0000 meq | EXTENDED_RELEASE_TABLET | Freq: Two times a day (BID) | ORAL | 0 refills | Status: DC

## 2018-10-31 MED ORDER — HYDROCODONE-ACETAMINOPHEN 10-325 MG PO TABS: 1.0000 | ORAL_TABLET | ORAL | 0 refills | Status: DC | PRN

## 2018-10-31 MED ORDER — DOCUSATE SODIUM 100 MG PO CAPS: 100.0000 mg | ORAL_CAPSULE | Freq: Two times a day (BID) | ORAL | 0 refills | Status: DC

## 2018-10-31 MED ORDER — TIZANIDINE HCL 4 MG PO TABS: 4.0000 mg | ORAL_TABLET | Freq: Three times a day (TID) | ORAL | 1 refills | Status: DC

## 2018-10-31 MED ORDER — ASPIRIN 325 MG PO TBEC: 325.0000 mg | DELAYED_RELEASE_TABLET | Freq: Every day | ORAL | 0 refills | Status: DC

## 2018-10-31 NOTE — Telephone Encounter (Signed)
Patient called at time of hospital discharge to home today, 10/31/18, following total knee surgery, to ask about the provider of her CPM* *Per Amy L, order was faxed to provider [facility], attention Berton Bon; I provided contact information for [employer] as per patient request, as she would like to know when to expect delivery.

## 2018-10-31 NOTE — Discharge Instructions (Signed)
Ted hose 2 weeks  Bone foam 3 times a day for 30 minutes  CPM at least 6 hours/day divided as you are comfortable with increased 10 degrees each day until maximum of 120 degrees on the machine

## 2018-10-31 NOTE — Discharge Summary (Signed)
Physician Discharge Summary  Patient ID: Amy Mooney MRN: [id number] DOB/AGE: [birth year] 53 y.o.  Admit date: 10/29/2018 Discharge date: 10/31/2018  Admission Diagnoses: Primary osteoarthritis right knee  Discharge Diagnoses: Primary osteoarthritis right knee  Discharged Condition: good  Procedure: Right total knee [employer] Sigma fixed bearing implant  Hospital Course:  Day 1 January 7 right total knee no complications Day 2 January 8 tolerated physical therapy well, potassium was low potassium was given orally Day 3 January 9 tolerated physical therapy complains of some cramping pain was well controlled CBC Latest Ref Rng & Units 10/30/2018 10/24/2018 08/19/2018  WBC 4.0 - 10.5 K/uL 13.4(H) 7.9 9.1  Hemoglobin 12.0 - 15.0 g/dL 10.5(L) 12.7 13.1  Hematocrit 36.0 - 46.0 % 33.7(L) 40.9 39.9  Platelets 150 - 400 K/uL 215 217 -   BMP Latest Ref Rng & Units 10/30/2018 10/24/2018 08/19/2018  Glucose 70 - 99 mg/dL 124(H) 88 83  BUN 6 - 20 mg/dL 14 21(H) 18  Creatinine 0.44 - 1.00 mg/dL 0.82 0.82 0.81  BUN/Creat Ratio 9 - 23 - - 22  Sodium 135 - 145 mmol/L 138 137 141  Potassium 3.5 - 5.1 mmol/L 3.1(L) 3.5 4.7  Chloride 98 - 111 mmol/L 104 101 100  CO2 22 - 32 mmol/L 26 29 23   Calcium 8.9 - 10.3 mg/dL 8.3(L) 9.6 9.2    Discharge Exam: Blood pressure 133/71, pulse 75, temperature 97.7 F (36.5 C), temperature source Oral, resp. rate 20, height 5\' 3"  (1.6 m), weight 109.5 kg, SpO2 91 %. She is awake alert and oriented Her leg is in full extension Her bandage has scant sanguinous drainage Her foot is moving normally  Disposition:    Allergies as of 10/31/2018      Reactions   Nuvigil [armodafinil] Hives   Penicillins Rash   Has patient had a PCN reaction causing immediate rash, facial/tongue/throat swelling, SOB or lightheadedness with hypotension: Unknown Has patient had a PCN reaction causing severe rash involving mucus membranes or skin necrosis: Unknown Has patient had a PCN  reaction that required hospitalization: Unknown Has patient had a PCN reaction occurring within the last 10 years: Unknown If all of the above answers are "NO", then may proceed with Cephalosporin use.   Vancomycin Hives      Medication List    STOP taking these medications   diclofenac 75 MG EC tablet Commonly known as:  VOLTAREN     TAKE these medications   aspirin 325 MG EC tablet Take 1 tablet (325 mg total) by mouth daily with breakfast. Start taking on:  November 01, 2018   CALCIUM 600+D 600-800 MG-UNIT Tabs Generic drug:  Calcium Carb-Cholecalciferol Take 1 tablet by mouth daily.   Diclofenac Sodium 2 % Soln Commonly known as:  PENNSAID Pennsaid 20 mg/gram/actuation (2 %) topical soln in metered-dose pump  apply 2 PUMPS twice daily What changed:    how much to take  how to take this  when to take this  reasons to take this   docusate sodium 100 MG capsule Commonly known as:  COLACE Take 1 capsule (100 mg total) by mouth 2 (two) times daily.   doxycycline 100 MG capsule Commonly known as:  VIBRAMYCIN Take 100 mg by mouth 2 (two) times daily as needed.   fluconazole 100 MG tablet Commonly known as:  DIFLUCAN Take 100 mg by mouth daily as needed (FINGER).   hydrochlorothiazide 25 MG tablet Commonly known as:  HYDRODIURIL Take 1 tablet (25 mg total) by mouth daily.  HYDROcodone-acetaminophen 10-325 MG tablet Commonly known as:  NORCO Take 1 tablet by mouth every 4 (four) hours as needed.   loratadine 10 MG tablet Commonly known as:  CLARITIN Take 10 mg by mouth daily as needed for allergies.   multivitamin with minerals tablet Take 1 tablet by mouth daily.   HAIR SKIN AND NAILS FORMULA PO Take 1 tablet by mouth daily.   potassium chloride SA 20 MEQ tablet Commonly known as:  K-DUR,KLOR-CON Take 1 tablet (20 mEq total) by mouth 2 (two) times daily.   pramipexole 0.25 MG tablet Commonly known as:  MIRAPEX TAKE 1 TABLET BY MOUTH 30 MINUTES TO 1  HOUR PRIOR TO BEDTIME What changed:  See the new instructions.   pregabalin 50 MG capsule Commonly known as:  LYRICA Take 1 capsule (50 mg total) by mouth 3 (three) times daily.   rizatriptan 10 MG tablet Commonly known as:  MAXALT TAKE 1 TABLET BY MOUTH AT MIGRAINE ONSET MAY REPEAT IN 2 HOURS IF HEADACHE PERSISTS.NOT EXCEED 2 TABLETS IN 24HRS What changed:  See the new instructions.   tiZANidine 4 MG tablet Commonly known as:  ZANAFLEX Take 1 tablet (4 mg total) by mouth 3 (three) times daily.   valACYclovir 1000 MG tablet Commonly known as:  VALTREX Take 1,000 mg by mouth 2 (two) times daily as needed.   Vitamin D3 125 MCG (5000 UT) Caps Take 1 capsule by mouth daily.      Follow-up Information    [facility] Follow up.   Specialty:  Home Health Services Why:  PT Contact information: [address] [place] [address] [phone]        Carole Civil, MD Follow up.   Specialties:  Orthopedic Surgery, Radiology Contact information: [address] [place] [address] [phone]          2-week follow-up for suture/staple removal  Signed: Arther Abbott 10/31/2018, 12:51 PM

## 2018-10-31 NOTE — Progress Notes (Signed)
Physical Therapy Treatment Patient Details Name: Amy Mooney MRN: [id number] DOB: [birth year] Today's Date: 10/31/2018  RIGHT KNEE ROM: 0-75 degrees AMBULATION DISTANCE: 200 feet using RW with Mod Indep    History of Present Illness Amy Mooney is a 53 y/o female, s/p Right TKA, 10/29/18 with the diagnosis of right knee osteoarthritis      PT Comments    Patient demonstrates increased endurance/distance for gait training with fair return for heel to toe stepping with RLE, initially c/o severe pain right knee with movement, but decreased after completing ROM exercises and continued sitting up in chair with RLE dangling after therapy.  Patient will benefit from continued physical therapy in hospital and recommended venue below to increase strength, balance, endurance for safe ADLs and gait.   Follow Up Recommendations  Home health PT;Supervision for mobility/OOB;Supervision - Intermittent     Equipment Recommendations  None recommended by PT    Recommendations for Other Services       Precautions / Restrictions Precautions Precautions: Fall Precaution Comments: s/p Right TKA Restrictions Weight Bearing Restrictions: Yes RLE Weight Bearing: Weight bearing as tolerated    Mobility  Bed Mobility               General bed mobility comments: Patient presents up in chair (assisted by nusing staff)  Transfers Overall transfer level: Needs assistance Equipment used: Rolling walker (2 wheeled) Transfers: Sit to/from [facility] Sit to Stand: Supervision Stand pivot transfers: Supervision       General transfer comment: less right pain for sit to stands/transfers  Ambulation/Gait Ambulation/Gait assistance: Modified independent (Device/Increase time) Gait Distance (Feet): 200 Feet Assistive device: Rolling walker (2 wheeled) Gait Pattern/deviations: Decreased step length - right;Decreased stance time - right;Decreased stride length Gait velocity:  decreased   General Gait Details: increased endurance/distance for ambulation with fair return for right heel to toe stepping due to right knee pain, no loss of balance   Stairs             Wheelchair Mobility    Modified Rankin (Stroke Patients Only)       Balance Overall balance assessment: Needs assistance Sitting-balance support: Feet supported;No upper extremity supported Sitting balance-Leahy Scale: Good     Standing balance support: Bilateral upper extremity supported;During functional activity Standing balance-Leahy Scale: Fair Standing balance comment: using RW                            Cognition Arousal/Alertness: Awake/alert Behavior During Therapy: WFL for tasks assessed/performed Overall Cognitive Status: Within Functional Limits for tasks assessed                                        Exercises Total Joint Exercises Ankle Circles/Pumps: Supine;AROM;Strengthening;Both;10 reps Quad Sets: Supine;AROM;Strengthening;10 reps;Right Short Arc Quad: Supine;AROM;Right;5 reps Heel Slides: Supine;AROM;Right;5 reps    General Comments        Pertinent Vitals/Pain Pain Assessment: Faces Faces Pain Scale: Hurts even more Pain Location: increased pain right knee with movement Pain Descriptors / Indicators: Sore;Grimacing;Guarding;Sharp Pain Intervention(s): Limited activity within patient's tolerance;Monitored during session;Premedicated before session    Home Living                      Prior Function            PT Goals (current goals can  now be found in the care plan section) Acute Rehab PT Goals Patient Stated Goal: return home with family to assist PT Goal Formulation: With patient/family Time For Goal Achievement: 11/02/18 Potential to Achieve Goals: Good Progress towards PT goals: Progressing toward goals    Frequency    7X/week      PT Plan Current plan remains appropriate    Co-evaluation               AM-PAC PT "6 Clicks" Mobility   Outcome Measure  Help needed turning from your back to your side while in a flat bed without using bedrails?: A Lot Help needed moving from lying on your back to sitting on the side of a flat bed without using bedrails?: Total Help needed moving to and from a bed to a chair (including a wheelchair)?: None Help needed standing up from a chair using your arms (e.g., wheelchair or bedside chair)?: None Help needed to walk in hospital room?: None Help needed climbing 3-5 steps with a railing? : A Lot 6 Click Score: 17    End of Session   Activity Tolerance: Patient tolerated treatment well;Patient limited by fatigue Patient left: in chair;with call bell/phone within reach;with family/visitor present Nurse Communication: Mobility status PT Visit Diagnosis: Unsteadiness on feet (R26.81);Other abnormalities of gait and mobility (R26.89);Muscle weakness (generalized) (M62.81)     Time: 5035-4656 PT Time Calculation (min) (ACUTE ONLY): 32 min  Charges:  $Gait Training: 8-22 mins $Therapeutic Exercise: 8-22 mins                     2:41 PM, 10/31/18 Lonell Grandchild, MPT Physical Therapist with [facility] 336 [phone] office [phone] mobile phone

## 2018-11-01 ENCOUNTER — Other Ambulatory Visit: Payer: Self-pay | Admitting: *Deleted

## 2018-11-01 NOTE — Patient Outreach (Addendum)
[facility]) Care Management  11/01/2018  MARYKATHERINE SHERWOOD 29-Jun-1966 [id number]   Transition of care call  Subjective: Initial successful telephone call to patient's preferred number in order to complete transition of care assessment;  2 HIPAA identifiers verified. Explained purpose of call and completed transition of care assessment. Also assessed and/or reviewed: caregiver assistance- husband and sister, pain control with prescribed pain medications, medication reconciliation, medication taking behavior, ambulation ability, presence of ordered DME, bowel and bladder function, diet tolerance,  verification of ordered home health services, normal dressing appearance, and post-operative problems requiring MD notification. Patient voices understanding of medical diagnosis, surgery, and treatment plan.  States she is accessing the following [facility] benefits: outpatient pharmacy- uses OP pharmacy at [facility], hospital indemnity- she has the  benefit and states she will call [employer] in the next few days.  Patient states her power cord to her ice machine was lost at discharge and she is asking for assistance in locating it.    Objective:  Darriel Sinquefield was hospitalized at [facility] from 1/7-/19 for right total knee arthroplasty related to primary osteoarthritis.  Comorbidities include: migraines, Vit D deficiency, HTN, OSA- does not use CPAP due to claustrophobia, kidney stones, restless leg syndrome, prediabetes with Hgb A1C= 5.7% in Oct 2019, narcolepsy, morbid obesity- she sees Dr Leafy Ro at the [facility] [employer]. She was discharged to home on 1/9 with home health physical therapy to be provided by [facility] and all necessary and ordered DME.   Assessment:  See transition of care flowsheet for assessment details.   Plan:  Called the 3rd floor nursing unit and spoke to the charge nurse and power cord to ice machine for her knee located  and will be held for patient at nurse's desk. Called [facility] and she expressed much gratitude and states her husband will retrieve the power cord.  No ongoing care management needs identified so will close case to [place] [facility] care management services and route successful outreach letter with [place] Management pamphlet and 24 hours Nurse Advice Line Magnet to [facility] clinical pool to be mailed to patient's home address.    Barrington Ellison RN,CCM,CDE [place] Management Coordinator Office Phone [phone] Office Fax [phone]

## 2018-11-02 DIAGNOSIS — M1712 Unilateral primary osteoarthritis, left knee: Secondary | ICD-10-CM | POA: Diagnosis not present

## 2018-11-02 DIAGNOSIS — Z96651 Presence of right artificial knee joint: Secondary | ICD-10-CM | POA: Diagnosis not present

## 2018-11-02 DIAGNOSIS — G4733 Obstructive sleep apnea (adult) (pediatric): Secondary | ICD-10-CM | POA: Diagnosis not present

## 2018-11-02 DIAGNOSIS — Z86010 Personal history of colonic polyps: Secondary | ICD-10-CM | POA: Diagnosis not present

## 2018-11-02 DIAGNOSIS — Z471 Aftercare following joint replacement surgery: Secondary | ICD-10-CM | POA: Diagnosis not present

## 2018-11-02 DIAGNOSIS — Z6841 Body Mass Index (BMI) 40.0 and over, adult: Secondary | ICD-10-CM | POA: Diagnosis not present

## 2018-11-02 DIAGNOSIS — Z7982 Long term (current) use of aspirin: Secondary | ICD-10-CM | POA: Diagnosis not present

## 2018-11-02 DIAGNOSIS — G2581 Restless legs syndrome: Secondary | ICD-10-CM | POA: Diagnosis not present

## 2018-11-04 ENCOUNTER — Encounter: Payer: Self-pay | Admitting: Radiology

## 2018-11-04 ENCOUNTER — Encounter: Payer: Self-pay | Admitting: Orthopedic Surgery

## 2018-11-04 ENCOUNTER — Telehealth: Payer: Self-pay | Admitting: Orthopedic Surgery

## 2018-11-04 ENCOUNTER — Ambulatory Visit (INDEPENDENT_AMBULATORY_CARE_PROVIDER_SITE_OTHER): Payer: 59 | Admitting: Orthopedic Surgery

## 2018-11-04 VITALS — BP 132/74 | HR 82 | Ht 63.0 in | Wt 236.0 lb

## 2018-11-04 DIAGNOSIS — Z96651 Presence of right artificial knee joint: Secondary | ICD-10-CM

## 2018-11-04 NOTE — Telephone Encounter (Signed)
Call received from [facility] at [facility], ph# [phone] requesting verbal orders as follows: 1 time a week for 1 week                                                          3 times a week for 1 week                                                                                                                                     2 times a week for 1 week

## 2018-11-04 NOTE — Progress Notes (Signed)
Chief Complaint  Patient presents with  . Post-op Problem    right lower leg pain / bruising     Postoperative visit status post total knee on 9 January  Patient complains of shin pain started yesterday worse today interfering with her weightbearing  Exam shows bruising and ecchymosis in the shin no pain or swelling of the knee  This area is very tender  Advised her to remove the TED hose ice for 30 minutes  every 2 hours stop the CPM and exercises for today and tomorrow  Follow-up keep the 22nd appointment to have the staples removed expect resolution in 3 to 5 days  Encounter Diagnosis  Name Primary?  . Status post total right knee replacement Yes

## 2018-11-04 NOTE — Telephone Encounter (Signed)
Called with Verbal

## 2018-11-04 NOTE — Patient Instructions (Signed)
Remove the TED hose  No CPM or physical therapy next 2 days then resume  Ice 30 minutes every 2 hours

## 2018-11-06 DIAGNOSIS — G2581 Restless legs syndrome: Secondary | ICD-10-CM | POA: Diagnosis not present

## 2018-11-06 DIAGNOSIS — M1712 Unilateral primary osteoarthritis, left knee: Secondary | ICD-10-CM | POA: Diagnosis not present

## 2018-11-06 DIAGNOSIS — Z6841 Body Mass Index (BMI) 40.0 and over, adult: Secondary | ICD-10-CM | POA: Diagnosis not present

## 2018-11-06 DIAGNOSIS — Z86010 Personal history of colonic polyps: Secondary | ICD-10-CM | POA: Diagnosis not present

## 2018-11-06 DIAGNOSIS — Z96651 Presence of right artificial knee joint: Secondary | ICD-10-CM | POA: Diagnosis not present

## 2018-11-06 DIAGNOSIS — G4733 Obstructive sleep apnea (adult) (pediatric): Secondary | ICD-10-CM | POA: Diagnosis not present

## 2018-11-06 DIAGNOSIS — Z7982 Long term (current) use of aspirin: Secondary | ICD-10-CM | POA: Diagnosis not present

## 2018-11-06 DIAGNOSIS — Z471 Aftercare following joint replacement surgery: Secondary | ICD-10-CM | POA: Diagnosis not present

## 2018-11-07 DIAGNOSIS — Z86010 Personal history of colonic polyps: Secondary | ICD-10-CM | POA: Diagnosis not present

## 2018-11-07 DIAGNOSIS — G2581 Restless legs syndrome: Secondary | ICD-10-CM | POA: Diagnosis not present

## 2018-11-07 DIAGNOSIS — Z471 Aftercare following joint replacement surgery: Secondary | ICD-10-CM | POA: Diagnosis not present

## 2018-11-07 DIAGNOSIS — Z96651 Presence of right artificial knee joint: Secondary | ICD-10-CM | POA: Diagnosis not present

## 2018-11-07 DIAGNOSIS — Z6841 Body Mass Index (BMI) 40.0 and over, adult: Secondary | ICD-10-CM | POA: Diagnosis not present

## 2018-11-07 DIAGNOSIS — G4733 Obstructive sleep apnea (adult) (pediatric): Secondary | ICD-10-CM | POA: Diagnosis not present

## 2018-11-07 DIAGNOSIS — Z7982 Long term (current) use of aspirin: Secondary | ICD-10-CM | POA: Diagnosis not present

## 2018-11-07 DIAGNOSIS — M1712 Unilateral primary osteoarthritis, left knee: Secondary | ICD-10-CM | POA: Diagnosis not present

## 2018-11-08 DIAGNOSIS — Z471 Aftercare following joint replacement surgery: Secondary | ICD-10-CM | POA: Diagnosis not present

## 2018-11-08 DIAGNOSIS — Z6841 Body Mass Index (BMI) 40.0 and over, adult: Secondary | ICD-10-CM | POA: Diagnosis not present

## 2018-11-08 DIAGNOSIS — M1712 Unilateral primary osteoarthritis, left knee: Secondary | ICD-10-CM | POA: Diagnosis not present

## 2018-11-08 DIAGNOSIS — G2581 Restless legs syndrome: Secondary | ICD-10-CM | POA: Diagnosis not present

## 2018-11-08 DIAGNOSIS — Z7982 Long term (current) use of aspirin: Secondary | ICD-10-CM | POA: Diagnosis not present

## 2018-11-08 DIAGNOSIS — G4733 Obstructive sleep apnea (adult) (pediatric): Secondary | ICD-10-CM | POA: Diagnosis not present

## 2018-11-08 DIAGNOSIS — Z86010 Personal history of colonic polyps: Secondary | ICD-10-CM | POA: Diagnosis not present

## 2018-11-08 DIAGNOSIS — Z96651 Presence of right artificial knee joint: Secondary | ICD-10-CM | POA: Diagnosis not present

## 2018-11-08 LAB — TYPE AND SCREEN
ABO/RH(D): A POS
ANTIBODY SCREEN: NEGATIVE
Unit division: 0
Unit division: 0

## 2018-11-08 LAB — BPAM RBC
Blood Product Expiration Date: 202001232359
Blood Product Expiration Date: 202001232359
Unit Type and Rh: 6200
Unit Type and Rh: 6200

## 2018-11-11 ENCOUNTER — Other Ambulatory Visit (INDEPENDENT_AMBULATORY_CARE_PROVIDER_SITE_OTHER): Payer: Self-pay | Admitting: Family Medicine

## 2018-11-11 ENCOUNTER — Ambulatory Visit: Payer: 59 | Admitting: Orthopedic Surgery

## 2018-11-11 DIAGNOSIS — G4733 Obstructive sleep apnea (adult) (pediatric): Secondary | ICD-10-CM | POA: Diagnosis not present

## 2018-11-11 DIAGNOSIS — Z471 Aftercare following joint replacement surgery: Secondary | ICD-10-CM | POA: Diagnosis not present

## 2018-11-11 DIAGNOSIS — Z86010 Personal history of colonic polyps: Secondary | ICD-10-CM | POA: Diagnosis not present

## 2018-11-11 DIAGNOSIS — I10 Essential (primary) hypertension: Secondary | ICD-10-CM

## 2018-11-11 DIAGNOSIS — Z96651 Presence of right artificial knee joint: Secondary | ICD-10-CM | POA: Diagnosis not present

## 2018-11-11 DIAGNOSIS — M1712 Unilateral primary osteoarthritis, left knee: Secondary | ICD-10-CM | POA: Diagnosis not present

## 2018-11-11 DIAGNOSIS — Z6841 Body Mass Index (BMI) 40.0 and over, adult: Secondary | ICD-10-CM | POA: Diagnosis not present

## 2018-11-11 DIAGNOSIS — G2581 Restless legs syndrome: Secondary | ICD-10-CM | POA: Diagnosis not present

## 2018-11-11 DIAGNOSIS — Z7982 Long term (current) use of aspirin: Secondary | ICD-10-CM | POA: Diagnosis not present

## 2018-11-12 DIAGNOSIS — M1712 Unilateral primary osteoarthritis, left knee: Secondary | ICD-10-CM | POA: Diagnosis not present

## 2018-11-12 DIAGNOSIS — Z471 Aftercare following joint replacement surgery: Secondary | ICD-10-CM | POA: Diagnosis not present

## 2018-11-12 DIAGNOSIS — Z6841 Body Mass Index (BMI) 40.0 and over, adult: Secondary | ICD-10-CM | POA: Diagnosis not present

## 2018-11-12 DIAGNOSIS — G4733 Obstructive sleep apnea (adult) (pediatric): Secondary | ICD-10-CM | POA: Diagnosis not present

## 2018-11-12 DIAGNOSIS — Z86010 Personal history of colonic polyps: Secondary | ICD-10-CM | POA: Diagnosis not present

## 2018-11-12 DIAGNOSIS — Z7982 Long term (current) use of aspirin: Secondary | ICD-10-CM | POA: Diagnosis not present

## 2018-11-12 DIAGNOSIS — Z96651 Presence of right artificial knee joint: Secondary | ICD-10-CM | POA: Diagnosis not present

## 2018-11-12 DIAGNOSIS — G2581 Restless legs syndrome: Secondary | ICD-10-CM | POA: Diagnosis not present

## 2018-11-13 ENCOUNTER — Encounter: Payer: Self-pay | Admitting: Orthopedic Surgery

## 2018-11-13 ENCOUNTER — Ambulatory Visit (INDEPENDENT_AMBULATORY_CARE_PROVIDER_SITE_OTHER): Payer: 59 | Admitting: Orthopedic Surgery

## 2018-11-13 VITALS — BP 158/85 | HR 81

## 2018-11-13 DIAGNOSIS — Z96651 Presence of right artificial knee joint: Secondary | ICD-10-CM

## 2018-11-13 MED ORDER — POTASSIUM CHLORIDE CRYS ER 20 MEQ PO TBCR: 20.0000 meq | EXTENDED_RELEASE_TABLET | Freq: Two times a day (BID) | ORAL | 0 refills | Status: DC

## 2018-11-13 MED ORDER — HYDROCODONE-ACETAMINOPHEN 7.5-325 MG PO TABS: 1.0000 | ORAL_TABLET | Freq: Three times a day (TID) | ORAL | 0 refills | Status: DC | PRN

## 2018-11-13 NOTE — Patient Instructions (Signed)
oow till next visit

## 2018-11-13 NOTE — Progress Notes (Signed)
POSTOP VISIT  POD # 15  Chief Complaint  Patient presents with  . Post-op Follow-up    right total knee replacement 10/29/18    Follow-up postop day 15.  The right leg pain is resolving the patient is currently on ibuprofen and Claritin with an occasional hydrocodone for pain her current range of motion is 4-88 by PT reports.  Suture staple removal today  Patient will start outpatient PT come back in 4 weeks 10/29/2018  9:46 AM  PATIENT:  Amy Mooney  53 y.o. female  PRE-OPERATIVE DIAGNOSIS:  right knee osteoarthritis  POST-OPERATIVE DIAGNOSIS:  right knee osteoarthritis  PROCEDURE:  Procedure(s): TOTAL KNEE ARTHROPLASTY (Right) [phone]  Findings at surgery multiple osteophytes extensive contracture of the posterior medial and anteromedial capsule creating internal rotation deformity as well as moderate varus deformity grade 4 chondral changes of the medial femoral condyle ACL and PCL were intact with a stenotic notch lateral meniscus was diminutive medial meniscus was intact  Preop range of motion under anesthesia 5-110 degrees  Implants: Depuy sigma tka: 69F 2.5 tibia 12.5 poly-38 patella fixed-bearing posterior stabilized   Surgeon Aline Brochure [phone]   Encounter Diagnosis  Name Primary?  . S/P total knee replacement, right 10/29/18 Yes     Postoperative plan (Work, [employer],  Meds ordered this encounter  Medications  . HYDROcodone-acetaminophen (NORCO) 7.5-325 MG tablet    Sig: Take 1 tablet by mouth every 8 (eight) hours as needed for moderate pain.    Dispense:  42 tablet    Refill:  0  . potassium chloride SA (K-DUR,KLOR-CON) 20 MEQ tablet    Sig: Take 1 tablet (20 mEq total) by mouth 2 (two) times daily.    Dispense:  28 tablet    Refill:  0  ,FU)  4 wks. No work 4 weeks

## 2018-11-18 DIAGNOSIS — Z96651 Presence of right artificial knee joint: Secondary | ICD-10-CM | POA: Diagnosis not present

## 2018-11-18 DIAGNOSIS — M25561 Pain in right knee: Secondary | ICD-10-CM | POA: Diagnosis not present

## 2018-11-19 ENCOUNTER — Encounter (INDEPENDENT_AMBULATORY_CARE_PROVIDER_SITE_OTHER): Payer: Self-pay

## 2018-11-21 DIAGNOSIS — Z96651 Presence of right artificial knee joint: Secondary | ICD-10-CM | POA: Diagnosis not present

## 2018-11-21 DIAGNOSIS — M25561 Pain in right knee: Secondary | ICD-10-CM | POA: Diagnosis not present

## 2018-11-25 ENCOUNTER — Other Ambulatory Visit: Payer: Self-pay | Admitting: Orthopedic Surgery

## 2018-11-25 ENCOUNTER — Other Ambulatory Visit (HOSPITAL_COMMUNITY): Payer: Self-pay | Admitting: Orthopedic Surgery

## 2018-11-25 DIAGNOSIS — Z96651 Presence of right artificial knee joint: Secondary | ICD-10-CM | POA: Diagnosis not present

## 2018-11-25 DIAGNOSIS — M25561 Pain in right knee: Secondary | ICD-10-CM | POA: Diagnosis not present

## 2018-11-25 NOTE — Telephone Encounter (Signed)
The pharmacy can not find the Rx that was sent in on 11/13/18 have called patient told her will be tomorrow, I can ask Dr Luna Glasgow to send in for her, can you please resend?

## 2018-11-25 NOTE — Telephone Encounter (Signed)
Amy Mooney called stating that when she was here on 11/13/18 her pharmacy received the prescription for potassium but not for the Hydrocodone/Acetaminohen 7.5-325 mgs.  Would you check on this for her and give her a call  She uses [facility]  Thanks so much

## 2018-11-26 ENCOUNTER — Encounter (INDEPENDENT_AMBULATORY_CARE_PROVIDER_SITE_OTHER): Payer: Self-pay | Admitting: Family Medicine

## 2018-11-26 ENCOUNTER — Ambulatory Visit (INDEPENDENT_AMBULATORY_CARE_PROVIDER_SITE_OTHER): Payer: 59 | Admitting: Family Medicine

## 2018-11-26 VITALS — BP 131/82 | HR 77 | Temp 97.7°F | Ht 63.0 in | Wt 234.0 lb

## 2018-11-26 DIAGNOSIS — I10 Essential (primary) hypertension: Secondary | ICD-10-CM | POA: Diagnosis not present

## 2018-11-26 DIAGNOSIS — Z6841 Body Mass Index (BMI) 40.0 and over, adult: Secondary | ICD-10-CM | POA: Diagnosis not present

## 2018-11-26 DIAGNOSIS — R7303 Prediabetes: Secondary | ICD-10-CM | POA: Diagnosis not present

## 2018-11-26 DIAGNOSIS — E66813 Obesity, class 3: Secondary | ICD-10-CM | POA: Insufficient documentation

## 2018-11-26 DIAGNOSIS — Z9189 Other specified personal risk factors, not elsewhere classified: Secondary | ICD-10-CM

## 2018-11-26 MED ORDER — HYDROCHLOROTHIAZIDE 25 MG PO TABS: 25.0000 mg | ORAL_TABLET | Freq: Every day | ORAL | 0 refills | Status: DC

## 2018-11-26 MED ORDER — HYDROCODONE-ACETAMINOPHEN 7.5-325 MG PO TABS: 1.0000 | ORAL_TABLET | Freq: Three times a day (TID) | ORAL | 0 refills | Status: DC | PRN

## 2018-11-26 NOTE — Telephone Encounter (Signed)
done

## 2018-11-26 NOTE — Progress Notes (Signed)
Office: [phone]  /  Fax: [phone]   HPI:   Chief Complaint: OBESITY Amy Mooney is here to discuss her progress with her obesity treatment plan. She is on the Category 2 plan and is following her eating plan approximately 50 % of the time. She states she is exercising 0 minutes 0 times per week. Amy Mooney had total right knee arthroplasty on 10/29/2018. She has not been on plan since her surgery. She has been sleeping more often than usual and not eating all of her food. She is not eating lunch because of sleep and exercise schedule. Her weight is 234 lb (106.1 kg) today and has had a weight loss of 4 pounds over a period of 7 weeks since her last visit. She has lost 10 lbs since starting treatment with [name].  Hypertension Amy Mooney is a 53 y.o. female with hypertension.  Amy Mooney denies chest pain or shortness of breath. She is working on weight loss to help control her blood pressure with the goal of decreasing her risk of heart attack and stroke. Hollies blood pressure is currently controlled. She started on HCTZ on 10/09/2018.  At risk for cardiovascular disease Amy Mooney is at a higher than average risk for cardiovascular disease due to obesity. She currently denies any chest pain.  Pre-Diabetes Amy Mooney has a diagnosis of prediabetes based on her elevated Hgb A1c and was informed this puts her at greater risk of developing diabetes. She is not taking metformin currently and continues to work on diet and exercise to decrease risk of diabetes. She denies nausea or hypoglycemia.   ASSESSMENT AND PLAN:  Essential hypertension - Plan: hydrochlorothiazide (HYDRODIURIL) 25 MG tablet  At risk for heart disease  Prediabetes  Class 3 severe obesity with serious comorbidity and body mass index (BMI) of 40.0 to 44.9 in adult, unspecified obesity type ([place])  PLAN:  Hypertension We discussed sodium restriction, working on healthy weight loss, and a regular exercise program as the means  to achieve improved blood pressure control. Chanelle agreed with this plan and agreed to follow up as directed. We will continue to monitor her blood pressure as well as her progress with the above lifestyle modifications. Amy Mooney agrees to continue taking HCTZ 25 mg qd #30 with no refills and follow up with our clinic in 2-3 weeks. She will watch for signs of hypotension as she continues her lifestyle modifications.   Cardiovascular risk counseling Amy Mooney was given extended (15 minutes) coronary artery disease prevention counseling today. She is 53 y.o. female and has risk factors for heart disease including obesity. We discussed intensive lifestyle modifications today with an emphasis on specific weight loss instructions and strategies. Pt was also informed of the importance of increasing exercise and decreasing saturated fats to help prevent heart disease.  Pre-Diabetes Amy Mooney will continue to work on weight loss, exercise, and decreasing simple carbohydrates in her diet to help decrease the risk of diabetes. She was informed that eating too many simple carbohydrates or too many calories at one sitting increases the likelihood of GI side effects. [name] agrees to continue with her meal plan and follow up with our clinic in 2-3 weeks.  Obesity Amy Mooney is currently in the action stage of change. As such, her goal is to continue with weight loss efforts She has agreed to follow the Category 2 plan [facility] [place] has been prescribed exercise. We discussed the following Behavioral Modification Strategies today: increasing lean protein intake, no skipping meals and planning for success  Amy Mooney  has agreed to follow up with our clinic in 2-3 weeks. She was informed of the importance of frequent follow up visits to maximize her success with intensive lifestyle modifications for her multiple health conditions.  ALLERGIES: Allergies  Allergen Reactions  . Nuvigil [Armodafinil] Hives  . Penicillins Rash    Has  patient had a PCN reaction causing immediate rash, facial/tongue/throat swelling, SOB or lightheadedness with hypotension: Unknown Has patient had a PCN reaction causing severe rash involving mucus membranes or skin necrosis: Unknown Has patient had a PCN reaction that required hospitalization: Unknown Has patient had a PCN reaction occurring within the last 10 years: Unknown If all of the above answers are "NO", then may proceed with Cephalosporin use.   . Vancomycin Hives  . Lyrica [Pregabalin] Hives    MEDICATIONS: Current Outpatient Medications on File Prior to Visit  Medication Sig Dispense Refill  . aspirin EC 325 MG EC tablet Take 1 tablet (325 mg total) by mouth daily with breakfast. 30 tablet 0  . Calcium Carb-Cholecalciferol (CALCIUM 600+D) 600-800 MG-UNIT TABS Take 1 tablet by mouth daily.    . Cholecalciferol (VITAMIN D3) 5000 units CAPS Take 1 capsule by mouth daily.    . Diclofenac Sodium (PENNSAID) 2 % SOLN Pennsaid 20 mg/gram/actuation (2 %) topical soln in metered-dose pump  apply 2 PUMPS twice daily (Patient taking differently: Apply 2 Pump topically 2 (two) times daily as needed (PAIN). Pennsaid 20 mg/gram/actuation (2 %) topical soln in metered-dose pump  apply 2 PUMPS twice daily) 112 g 0  . docusate sodium (COLACE) 100 MG capsule Take 1 capsule (100 mg total) by mouth 2 (two) times daily. 10 capsule 0  . doxycycline (VIBRAMYCIN) 100 MG capsule Take 100 mg by mouth 2 (two) times daily as needed.    . fluconazole (DIFLUCAN) 100 MG tablet Take 100 mg by mouth daily as needed (FINGER).     Marland Kitchen HYDROcodone-acetaminophen (NORCO) 7.5-325 MG tablet Take 1 tablet by mouth every 8 (eight) hours as needed for moderate pain. 42 tablet 0  . loratadine (CLARITIN) 10 MG tablet Take 10 mg by mouth daily as needed for allergies.    . Multiple Vitamins-Minerals (HAIR SKIN AND NAILS FORMULA PO) Take 1 tablet by mouth daily.    . Multiple Vitamins-Minerals (MULTIVITAMIN WITH MINERALS) tablet  Take 1 tablet by mouth daily.    . potassium chloride SA (K-DUR,KLOR-CON) 20 MEQ tablet Take 1 tablet (20 mEq total) by mouth 2 (two) times daily. 28 tablet 0  . pramipexole (MIRAPEX) 0.25 MG tablet TAKE 1 TABLET BY MOUTH 30 MINUTES TO 1 HOUR PRIOR TO BEDTIME (Patient taking differently: Take 0.25 mg by mouth at bedtime. Take 1 tablet by mouth 30 minutes to 1 hour prior to bedtime.) 90 tablet 1  . rizatriptan (MAXALT) 10 MG tablet TAKE 1 TABLET BY MOUTH AT MIGRAINE ONSET MAY REPEAT IN 2 HOURS IF HEADACHE PERSISTS.NOT EXCEED 2 TABLETS IN 24HRS (Patient taking differently: Take 10 mg by mouth as needed for migraine. ) 10 tablet 0  . tiZANidine (ZANAFLEX) 4 MG tablet Take 1 tablet (4 mg total) by mouth 3 (three) times daily. 90 tablet 1  . valACYclovir (VALTREX) 1000 MG tablet Take 1,000 mg by mouth 2 (two) times daily as needed.     No current facility-administered medications on file prior to visit.     PAST MEDICAL HISTORY: Past Medical History:  Diagnosis Date  . Chronic knee pain    arthritis  . Fever blister    history  of  . History of kidney stones   . Hypertension   . Migraines   . Morbid obesity with BMI of 40.0-44.9, adult ([place])   . 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 Oral herpes   . Osteoarthritis   . Restless leg   . Sleep apnea    per pt not treated; not able to use CPAP due to claustrophobia  . Vitamin D deficiency     PAST SURGICAL HISTORY: Past Surgical History:  Procedure Laterality Date  . ABDOMINAL HYSTERECTOMY  2010  . artroscopic meniscus repair Bilateral   . cervix removed, bladder tack     2015  . COLONOSCOPY WITH PROPOFOL N/A 06/19/2017   Procedure: COLONOSCOPY WITH PROPOFOL;  Surgeon: [name], MD;  Location: [facility] ENDOSCOPY;  Service: Endoscopy;  Laterality: N/A;  . LAPAROSCOPIC SALPINGO OOPHERECTOMY Right 03/22/2017   Procedure: LAPAROSCOPIC SALPINGO OOPHORECTOMY;  Surgeon: Ward, Honor Loh, MD;  Location: [facility] ORS;  Service: Gynecology;   Laterality: Right;  . TOTAL KNEE ARTHROPLASTY Right 10/29/2018   Procedure: TOTAL KNEE ARTHROPLASTY;  Surgeon: Carole Civil, MD;  Location: [facility] ORS;  Service: Orthopedics;  Laterality: Right;    SOCIAL HISTORY: Social History   Tobacco Use  . Smoking status: Former Smoker    Packs/day: 0.50    Years: 4.00    Pack years: 2.00    Last attempt to quit: 10/23/1986    Years since quitting: 32.1  . Smokeless tobacco: Never Used  Substance Use Topics  . Alcohol use: No  . Drug use: No    FAMILY HISTORY: Family History  Problem Relation Age of Onset  . Breast cancer Mother 87  . Diabetes Mother   . Depression Mother   . Anxiety disorder Mother   . Obesity Mother   . Breast cancer Paternal Grandmother     ROS: Review of Systems  Constitutional: Positive for weight loss.  Respiratory: Negative for shortness of breath.   Cardiovascular: Negative for chest pain.  Genitourinary:       Negative for polyuria  Endo/Heme/Allergies:       Negative for hypoglycemia Negative for polyphagia    PHYSICAL EXAM: Blood pressure 131/82, pulse 77, temperature 97.7 F (36.5 C), temperature source Oral, height 5\' 3"  (1.6 m), weight 234 lb (106.1 kg), SpO2 98 %. Body mass index is 41.45 kg/m. Physical Exam Vitals signs reviewed.  Constitutional:      Appearance: Normal appearance. She is obese.  Cardiovascular:     Rate and Rhythm: Normal rate.     Pulses: Normal pulses.  Pulmonary:     Effort: Pulmonary effort is normal.  Musculoskeletal: Normal range of motion.  Skin:    General: Skin is warm and dry.  Neurological:     Mental Status: She is alert and oriented to person, place, and time.  Psychiatric:        Mood and Affect: Mood normal.        Behavior: Behavior normal.     RECENT LABS AND TESTS: BMET    Component Value Date/Time   NA 138 10/30/2018 0502   NA 141 08/19/2018 1005   NA 137 05/23/2013 1515   K 3.1 (L) 10/30/2018 0502   K 3.9 05/23/2013 1515   CL 104  10/30/2018 0502   CL 104 05/23/2013 1515   CO2 26 10/30/2018 0502   CO2 35 (H) 05/23/2013 1515   GLUCOSE 124 (H) 10/30/2018 0502   GLUCOSE 68 05/23/2013 1515   BUN 14 10/30/2018 0502   BUN  18 08/19/2018 1005   BUN 10 05/23/2013 1515   CREATININE 0.82 10/30/2018 0502   CREATININE 0.86 05/23/2013 1515   CALCIUM 8.3 (L) 10/30/2018 0502   CALCIUM 8.4 (L) 05/23/2013 1515   GFRNONAA >60 10/30/2018 0502   GFRNONAA >60 05/23/2013 1515   GFRAA >60 10/30/2018 0502   GFRAA >60 05/23/2013 1515   Lab Results  Component Value Date   HGBA1C 5.7 (H) 08/19/2018   HGBA1C 5.9 06/03/2018   HGBA1C 5.8 04/27/2017   HGBA1C 5.7 03/24/2016   Lab Results  Component Value Date   INSULIN 29.0 (H) 08/19/2018   CBC    Component Value Date/Time   WBC 13.4 (H) 10/30/2018 0502   RBC 3.87 10/30/2018 0502   HGB 10.5 (L) 10/30/2018 0502   HGB 13.1 08/19/2018 1005   HCT 33.7 (L) 10/30/2018 0502   HCT 39.9 08/19/2018 1005   PLT 215 10/30/2018 0502   PLT 225 11/17/2016 1000   MCV 87.1 10/30/2018 0502   MCV 85 08/19/2018 1005   MCV 83 05/14/2013 1149   MCH 27.1 10/30/2018 0502   MCHC 31.2 10/30/2018 0502   RDW 13.8 10/30/2018 0502   RDW 14.0 08/19/2018 1005   RDW 14.1 05/14/2013 1149   LYMPHSABS 2.9 10/24/2018 0815   LYMPHSABS 3.1 08/19/2018 1005   MONOABS 0.5 10/24/2018 0815   EOSABS 0.2 10/24/2018 0815   EOSABS 0.2 08/19/2018 1005   BASOSABS 0.0 10/24/2018 0815   BASOSABS 0.1 08/19/2018 1005   Iron/TIBC/Ferritin/ %Sat    Component Value Date/Time   IRON 50 06/24/2014 1542   TIBC 328 06/24/2014 1542   FERRITIN 130 06/24/2014 1542   IRONPCTSAT 15 06/24/2014 1542   Lipid Panel     Component Value Date/Time   CHOL 178 08/19/2018 1005   TRIG 160 (H) 08/19/2018 1005   HDL 46 08/19/2018 1005   CHOLHDL 3 06/03/2018 0746   VLDL 28.0 06/03/2018 0746   LDLCALC 100 (H) 08/19/2018 1005   Hepatic Function Panel     Component Value Date/Time   PROT 7.3 08/19/2018 1005   ALBUMIN 4.5  08/19/2018 1005   AST 28 08/19/2018 1005   ALT 33 (H) 08/19/2018 1005   ALKPHOS 115 08/19/2018 1005   BILITOT 0.6 08/19/2018 1005      Component Value Date/Time   TSH 2.850 08/19/2018 1005   TSH 1.70 03/24/2016 1259      OBESITY BEHAVIORAL INTERVENTION VISIT  Today's visit was # 5   Starting weight: 244 lbs Starting date: 08/19/2018 Today's weight :: 234 lbs Today's date: 11/26/2018 Total lbs lost to date: 10   ASK: We discussed the diagnosis of obesity with Oriel B Kochel today and [facility] agreed to give [name] permission to discuss obesity behavioral modification therapy today.  ASSESS: Fidelia has the diagnosis of obesity and her BMI today is 41.46 Nahla is in the action stage of change   ADVISE: Wanita was educated on the multiple health risks of obesity as well as the benefit of weight loss to improve her health. She was advised of the need for long term treatment and the importance of lifestyle modifications to improve her current health and to decrease her risk of future health problems.  AGREE: Multiple dietary modification options and treatment options were discussed and  Kimberla agreed to follow the recommendations documented in the above note.  ARRANGE: Kirstie was educated on the importance of frequent visits to treat obesity as outlined per CMS and USPSTF guidelines and agreed to schedule her next follow  up appointment today.  I, Tammy Wysor, am acting as Location manager for [employer], FNP-C.  I have reviewed the above documentation for accuracy and completeness, and I agree with the above.  - Addelynn Batte, FNP-C.

## 2018-11-26 NOTE — Telephone Encounter (Signed)
Printed

## 2018-11-28 ENCOUNTER — Ambulatory Visit (INDEPENDENT_AMBULATORY_CARE_PROVIDER_SITE_OTHER): Payer: 59 | Admitting: Family Medicine

## 2018-11-28 DIAGNOSIS — Z96651 Presence of right artificial knee joint: Secondary | ICD-10-CM | POA: Diagnosis not present

## 2018-11-28 DIAGNOSIS — M25561 Pain in right knee: Secondary | ICD-10-CM | POA: Diagnosis not present

## 2018-12-02 DIAGNOSIS — M25561 Pain in right knee: Secondary | ICD-10-CM | POA: Diagnosis not present

## 2018-12-02 DIAGNOSIS — Z96651 Presence of right artificial knee joint: Secondary | ICD-10-CM | POA: Diagnosis not present

## 2018-12-04 DIAGNOSIS — M25561 Pain in right knee: Secondary | ICD-10-CM | POA: Diagnosis not present

## 2018-12-04 DIAGNOSIS — Z96651 Presence of right artificial knee joint: Secondary | ICD-10-CM | POA: Diagnosis not present

## 2018-12-09 ENCOUNTER — Ambulatory Visit (INDEPENDENT_AMBULATORY_CARE_PROVIDER_SITE_OTHER): Payer: 59 | Admitting: Orthopedic Surgery

## 2018-12-09 ENCOUNTER — Encounter: Payer: Self-pay | Admitting: Orthopedic Surgery

## 2018-12-09 VITALS — BP 132/79 | HR 69 | Ht 63.0 in

## 2018-12-09 DIAGNOSIS — M1712 Unilateral primary osteoarthritis, left knee: Secondary | ICD-10-CM | POA: Diagnosis not present

## 2018-12-09 DIAGNOSIS — Z96651 Presence of right artificial knee joint: Secondary | ICD-10-CM

## 2018-12-09 NOTE — Progress Notes (Signed)
POSTOP VISIT  POD # 41//6 WEEK APPT   Chief Complaint  Patient presents with  . Post-op Follow-up    right total knee replacement 10/29/18     53 year old female had her right total knee on January 7 this is her 6-week follow-up she flexes her knee to 95 degrees and feels tightness on the front of the knee she has full extension she is having pain at night for which she takes Lyrica and tizanidine   Encounter Diagnosis  Name Primary?  . S/P total knee replacement, right 10/29/18 Yes    Knee incision looks good knee extension looks great knee flexes in the office 90 degrees  Postoperative plan (Work, WB, No orders of the defined types were placed in this encounter. ,FU)  53 years old history of osteoarthritis left knee as well.  Patient requests injection  Procedure note left knee injection verbal consent was obtained to inject left knee joint  Timeout was completed to confirm the site of injection  The medications used were 40 mg of Depo-Medrol and 1% lidocaine 3 cc  Anesthesia was provided by ethyl chloride and the skin was prepped with alcohol.  After cleaning the skin with alcohol a 20-gauge needle was used to inject the left knee joint. There were no complications. A sterile bandage was applied.  She can stop taking the aspirin she can take the Lyrica and tizanidine at night we will continue tapering the opioids.  Follow-up 6 weeks

## 2018-12-09 NOTE — Patient Instructions (Signed)
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

## 2018-12-10 DIAGNOSIS — M25561 Pain in right knee: Secondary | ICD-10-CM | POA: Diagnosis not present

## 2018-12-10 DIAGNOSIS — Z96651 Presence of right artificial knee joint: Secondary | ICD-10-CM | POA: Diagnosis not present

## 2018-12-11 ENCOUNTER — Ambulatory Visit: Payer: 59 | Admitting: Orthopedic Surgery

## 2018-12-11 ENCOUNTER — Other Ambulatory Visit: Payer: Self-pay | Admitting: Primary Care

## 2018-12-11 DIAGNOSIS — G2581 Restless legs syndrome: Secondary | ICD-10-CM

## 2018-12-12 DIAGNOSIS — Z96651 Presence of right artificial knee joint: Secondary | ICD-10-CM | POA: Diagnosis not present

## 2018-12-12 DIAGNOSIS — M25561 Pain in right knee: Secondary | ICD-10-CM | POA: Diagnosis not present

## 2018-12-16 DIAGNOSIS — D2271 Melanocytic nevi of right lower limb, including hip: Secondary | ICD-10-CM | POA: Diagnosis not present

## 2018-12-16 DIAGNOSIS — D485 Neoplasm of uncertain behavior of skin: Secondary | ICD-10-CM | POA: Diagnosis not present

## 2018-12-16 DIAGNOSIS — I8311 Varicose veins of right lower extremity with inflammation: Secondary | ICD-10-CM | POA: Diagnosis not present

## 2018-12-17 ENCOUNTER — Ambulatory Visit (INDEPENDENT_AMBULATORY_CARE_PROVIDER_SITE_OTHER): Payer: 59 | Admitting: Family Medicine

## 2018-12-17 ENCOUNTER — Encounter (INDEPENDENT_AMBULATORY_CARE_PROVIDER_SITE_OTHER): Payer: Self-pay | Admitting: Family Medicine

## 2018-12-17 VITALS — BP 123/85 | HR 57 | Temp 98.2°F | Ht 63.0 in | Wt 233.0 lb

## 2018-12-17 DIAGNOSIS — R7303 Prediabetes: Secondary | ICD-10-CM | POA: Diagnosis not present

## 2018-12-17 DIAGNOSIS — Z9189 Other specified personal risk factors, not elsewhere classified: Secondary | ICD-10-CM

## 2018-12-17 DIAGNOSIS — E559 Vitamin D deficiency, unspecified: Secondary | ICD-10-CM | POA: Insufficient documentation

## 2018-12-17 DIAGNOSIS — E7849 Other hyperlipidemia: Secondary | ICD-10-CM | POA: Diagnosis not present

## 2018-12-17 DIAGNOSIS — Z6841 Body Mass Index (BMI) 40.0 and over, adult: Secondary | ICD-10-CM | POA: Diagnosis not present

## 2018-12-17 NOTE — Progress Notes (Signed)
Office: [phone]  /  Fax: [phone]   HPI:   Chief Complaint: OBESITY Amy Mooney is here to discuss her progress with her obesity treatment plan. She is on the Category 2 plan and is following her eating plan approximately 80 % of the time. She states she is exercising 0 minutes 0 times per week. Amy Mooney started back to work yesterday from leave for total right knee arthroplasty  Her weight is 233 lb (105.7 kg) today and has had a weight loss of 1 pounds over a period of 3 weeks since her last visit. She has lost 11 lbs since starting treatment with [name].  Pre-Diabetes Amy Mooney has a diagnosis of prediabetes based on her elevated Hgb A1c and was informed this puts her at greater risk of developing diabetes. She is not taking metformin currently and continues to work on diet and exercise to decrease risk of diabetes. She denies nausea or hypoglycemia. Her last A1C was 5.7 on 08/19/18.   Vitamin D deficiency Amy Mooney has a diagnosis of vitamin D deficiency. She is currently taking OTC Vit D3 5000 units daily. She is not quite at goal. Her last Vit D level 45.4 on 08/19/18. She denies nausea, vomiting or muscle weakness.  Hyperlipidemia Amy Mooney has hyperlipidemia and has been trying to improve her cholesterol levels with intensive lifestyle modification including a low saturated fat diet, exercise and weight loss. She denies any chest pain or shortness of breath. She is not on a statin. Her last Triglyceride level was elevated at 160, LDL 100 and HDL 46. The 10-year ASCVD risk score Amy Mooney DC [name]., et al., 2013) is: 2%   Values used to calculate the score:     Age: 37 years     Sex: Female     Is Non-Hispanic African American: No     Diabetic: No     Tobacco smoker: No     Systolic Blood Pressure: 563 mmHg     Is BP treated: Yes     HDL Cholesterol: 46 mg/dL     Total Cholesterol: 178 mg/dL   At risk for osteopenia and osteoporosis Amy Mooney is at higher risk of osteopenia and osteoporosis due  to vitamin D deficiency.    ASSESSMENT AND PLAN:  Prediabetes - Plan: Hemoglobin A1c, Insulin, random  Vitamin D deficiency - Plan: VITAMIN D 25 Hydroxy (Vit-D Deficiency, Fractures)  Other hyperlipidemia - Plan: Lipid Panel With LDL/HDL Ratio  At risk for osteoporosis  Class 3 severe obesity with serious comorbidity and body mass index (BMI) of 40.0 to 44.9 in adult, unspecified obesity type ([place])  PLAN:  Pre-Diabetes Amy Mooney will continue to work on weight loss, exercise, and decreasing simple carbohydrates in her diet to help decrease the risk of diabetes. We will check labs today. Amy Mooney agrees to follow up with our clinic in 4 weeks.  Vitamin D Deficiency [name] was informed that low vitamin D levels contributes to fatigue and are associated with obesity, breast, and colon cancer. She agrees to continue to take prescription Vit D3 5000 IU daily and will follow up for routine testing of vitamin D, at least 2-3 times per year. She was informed of the risk of over-replacement of vitamin D and agrees to not increase her dose unless she discusses this with [name] first. We will check labs today. Amy Mooney agrees to follow up with our clinic in 4 weeks.  Hyperlipidemia Amy Mooney was informed of the American Heart Association Guidelines emphasizing intensive lifestyle modifications as the first line treatment for  hyperlipidemia. We discussed many lifestyle modifications today in depth, and Amy Mooney will continue to work on decreasing saturated fats such as fatty red meat, butter and many fried foods. She will also increase vegetables and lean protein in her diet and continue to work on exercise and weight loss efforts. We will check labs today. Jnaya agrees to follow up with our clinic in 4 weeks.  At risk for osteopenia and osteoporosis Octivia was given extended  (15 minutes) osteoporosis prevention counseling today. Amy Mooney is at risk for osteopenia and osteoporosis due to her vitamin D deficiency.  She was encouraged to take her vitamin D and follow her higher calcium diet and increase strengthening exercise to help strengthen her bones and decrease her risk of osteopenia and osteoporosis.  Obesity Amy Mooney is currently in the action stage of change. As such, her goal is to continue with weight loss efforts She has agreed to follow the Category 2 plan with lunch options and breakfast options Amy Mooney has been instructed to continue with physical therapy twice weekly. We discussed the following Behavioral Modification Strategies today: increasing lean protein intake and planning for success  Amy Mooney has agreed to follow up with our clinic in 4 weeks. She was informed of the importance of frequent follow up visits to maximize her success with intensive lifestyle modifications for her multiple health conditions.  ALLERGIES: Allergies  Allergen Reactions  . Nuvigil [Armodafinil] Hives  . Penicillins Rash    Has patient had a PCN reaction causing immediate rash, facial/tongue/throat swelling, SOB or lightheadedness with hypotension: Unknown Has patient had a PCN reaction causing severe rash involving mucus membranes or skin necrosis: Unknown Has patient had a PCN reaction that required hospitalization: Unknown Has patient had a PCN reaction occurring within the last 10 years: Unknown If all of the above answers are "NO", then may proceed with Cephalosporin use.   . Vancomycin Hives  . Lyrica [Pregabalin] Hives    MEDICATIONS: Current Outpatient Medications on File Prior to Visit  Medication Sig Dispense Refill  . Calcium Carb-Cholecalciferol (CALCIUM 600+D) 600-800 MG-UNIT TABS Take 1 tablet by mouth daily.    . Cholecalciferol (VITAMIN D3) 5000 units CAPS Take 1 capsule by mouth daily.    . Diclofenac Sodium (PENNSAID) 2 % SOLN Pennsaid 20 mg/gram/actuation (2 %) topical soln in metered-dose pump  apply 2 PUMPS twice daily (Patient taking differently: Apply 2 Pump topically 2 (two) times  daily as needed (PAIN). Pennsaid 20 mg/gram/actuation (2 %) topical soln in metered-dose pump  apply 2 PUMPS twice daily) 112 g 0  . docusate sodium (COLACE) 100 MG capsule Take 1 capsule (100 mg total) by mouth 2 (two) times daily. 10 capsule 0  . doxycycline (VIBRAMYCIN) 100 MG capsule Take 100 mg by mouth 2 (two) times daily as needed.    . fluconazole (DIFLUCAN) 100 MG tablet Take 100 mg by mouth daily as needed (FINGER).     . hydrochlorothiazide (HYDRODIURIL) 25 MG tablet Take 1 tablet (25 mg total) by mouth daily. 30 tablet 0  . HYDROcodone-acetaminophen (NORCO) 7.5-325 MG tablet Take 1 tablet by mouth every 8 (eight) hours as needed for moderate pain. 42 tablet 0  . loratadine (CLARITIN) 10 MG tablet Take 10 mg by mouth daily as needed for allergies.    . Multiple Vitamins-Minerals (HAIR SKIN AND NAILS FORMULA PO) Take 1 tablet by mouth daily.    . Multiple Vitamins-Minerals (MULTIVITAMIN WITH MINERALS) tablet Take 1 tablet by mouth daily.    . pramipexole (MIRAPEX) 0.25  MG tablet TAKE 1 TABLET BY MOUTH 30 MINUTES TO 1 HOUR PRIOR TO BEDTIME 90 tablet 1  . pregabalin (LYRICA) 50 MG capsule Take 50 mg by mouth 3 (three) times daily.    . rizatriptan (MAXALT) 10 MG tablet TAKE 1 TABLET BY MOUTH AT MIGRAINE ONSET MAY REPEAT IN 2 HOURS IF HEADACHE PERSISTS.NOT EXCEED 2 TABLETS IN 24HRS (Patient taking differently: Take 10 mg by mouth as needed for migraine. ) 10 tablet 0  . tiZANidine (ZANAFLEX) 4 MG tablet Take 1 tablet (4 mg total) by mouth 3 (three) times daily. 90 tablet 1  . valACYclovir (VALTREX) 1000 MG tablet Take 1,000 mg by mouth 2 (two) times daily as needed.     No current facility-administered medications on file prior to visit.     PAST MEDICAL HISTORY: Past Medical History:  Diagnosis Date  . Chronic knee pain    arthritis  . Fever blister    history of  . History of kidney stones   . Hypertension   . Migraines   . Morbid obesity with BMI of 40.0-44.9, adult ([place])   .  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 Oral herpes   . Osteoarthritis   . Restless leg   . Sleep apnea    per pt not treated; not able to use CPAP due to claustrophobia  . Vitamin D deficiency     PAST SURGICAL HISTORY: Past Surgical History:  Procedure Laterality Date  . ABDOMINAL HYSTERECTOMY  2010  . artroscopic meniscus repair Bilateral   . cervix removed, bladder tack     2015  . COLONOSCOPY WITH PROPOFOL N/A 06/19/2017   Procedure: COLONOSCOPY WITH PROPOFOL;  Surgeon: [name], MD;  Location: [facility] ENDOSCOPY;  Service: Endoscopy;  Laterality: N/A;  . LAPAROSCOPIC SALPINGO OOPHERECTOMY Right 03/22/2017   Procedure: LAPAROSCOPIC SALPINGO OOPHORECTOMY;  Surgeon: Ward, Honor Loh, MD;  Location: [facility] ORS;  Service: Gynecology;  Laterality: Right;  . TOTAL KNEE ARTHROPLASTY Right 10/29/2018   Procedure: TOTAL KNEE ARTHROPLASTY;  Surgeon: Carole Civil, MD;  Location: [facility] ORS;  Service: Orthopedics;  Laterality: Right;    SOCIAL HISTORY: Social History   Tobacco Use  . Smoking status: Former Smoker    Packs/day: 0.50    Years: 4.00    Pack years: 2.00    Last attempt to quit: 10/23/1986    Years since quitting: 32.1  . Smokeless tobacco: Never Used  Substance Use Topics  . Alcohol use: No  . Drug use: No    FAMILY HISTORY: Family History  Problem Relation Age of Onset  . Breast cancer Mother 68  . Diabetes Mother   . Depression Mother   . Anxiety disorder Mother   . Obesity Mother   . Breast cancer Paternal Grandmother     ROS: Review of Systems  Constitutional: Positive for weight loss.  Respiratory: Negative for shortness of breath.   Cardiovascular: Negative for chest pain.  Gastrointestinal: Negative for nausea and vomiting.  Genitourinary:       Negative for polyuria  Musculoskeletal:       Negative for muscle weakness  Endo/Heme/Allergies:       Negative for hypoglycemia Negative for polyphagia    PHYSICAL EXAM: Blood pressure 123/85, pulse  (!) 57, temperature 98.2 F (36.8 C), temperature source Oral, height 5\' 3"  (1.6 m), weight 233 lb (105.7 kg), SpO2 100 %. Body mass index is 41.27 kg/m. Physical Exam Vitals signs reviewed.  Constitutional:      Appearance: Normal appearance. She  is obese.  Cardiovascular:     Rate and Rhythm: Normal rate.     Pulses: Normal pulses.  Pulmonary:     Effort: Pulmonary effort is normal.  Musculoskeletal: Normal range of motion.  Skin:    General: Skin is warm and dry.  Neurological:     Mental Status: She is alert and oriented to person, place, and time.  Psychiatric:        Mood and Affect: Mood normal.        Behavior: Behavior normal.     RECENT LABS AND TESTS: BMET    Component Value Date/Time   NA 138 10/30/2018 0502   NA 141 08/19/2018 1005   NA 137 05/23/2013 1515   K 3.1 (L) 10/30/2018 0502   K 3.9 05/23/2013 1515   CL 104 10/30/2018 0502   CL 104 05/23/2013 1515   CO2 26 10/30/2018 0502   CO2 35 (H) 05/23/2013 1515   GLUCOSE 124 (H) 10/30/2018 0502   GLUCOSE 68 05/23/2013 1515   BUN 14 10/30/2018 0502   BUN 18 08/19/2018 1005   BUN 10 05/23/2013 1515   CREATININE 0.82 10/30/2018 0502   CREATININE 0.86 05/23/2013 1515   CALCIUM 8.3 (L) 10/30/2018 0502   CALCIUM 8.4 (L) 05/23/2013 1515   GFRNONAA >60 10/30/2018 0502   GFRNONAA >60 05/23/2013 1515   GFRAA >60 10/30/2018 0502   GFRAA >60 05/23/2013 1515   Lab Results  Component Value Date   HGBA1C 5.7 (H) 08/19/2018   HGBA1C 5.9 06/03/2018   HGBA1C 5.8 04/27/2017   HGBA1C 5.7 03/24/2016   Lab Results  Component Value Date   INSULIN 29.0 (H) 08/19/2018   CBC    Component Value Date/Time   WBC 13.4 (H) 10/30/2018 0502   RBC 3.87 10/30/2018 0502   HGB 10.5 (L) 10/30/2018 0502   HGB 13.1 08/19/2018 1005   HCT 33.7 (L) 10/30/2018 0502   HCT 39.9 08/19/2018 1005   PLT 215 10/30/2018 0502   PLT 225 11/17/2016 1000   MCV 87.1 10/30/2018 0502   MCV 85 08/19/2018 1005   MCV 83 05/14/2013 1149   MCH  27.1 10/30/2018 0502   MCHC 31.2 10/30/2018 0502   RDW 13.8 10/30/2018 0502   RDW 14.0 08/19/2018 1005   RDW 14.1 05/14/2013 1149   LYMPHSABS 2.9 10/24/2018 0815   LYMPHSABS 3.1 08/19/2018 1005   MONOABS 0.5 10/24/2018 0815   EOSABS 0.2 10/24/2018 0815   EOSABS 0.2 08/19/2018 1005   BASOSABS 0.0 10/24/2018 0815   BASOSABS 0.1 08/19/2018 1005   Iron/TIBC/Ferritin/ %Sat    Component Value Date/Time   IRON 50 06/24/2014 1542   TIBC 328 06/24/2014 1542   FERRITIN 130 06/24/2014 1542   IRONPCTSAT 15 06/24/2014 1542   Lipid Panel     Component Value Date/Time   CHOL 178 08/19/2018 1005   TRIG 160 (H) 08/19/2018 1005   HDL 46 08/19/2018 1005   CHOLHDL 3 06/03/2018 0746   VLDL 28.0 06/03/2018 0746   LDLCALC 100 (H) 08/19/2018 1005   Hepatic Function Panel     Component Value Date/Time   PROT 7.3 08/19/2018 1005   ALBUMIN 4.5 08/19/2018 1005   AST 28 08/19/2018 1005   ALT 33 (H) 08/19/2018 1005   ALKPHOS 115 08/19/2018 1005   BILITOT 0.6 08/19/2018 1005      Component Value Date/Time   TSH 2.850 08/19/2018 1005   TSH 1.70 03/24/2016 1259     Ref. Range 08/19/2018 10:05  Vitamin D, 25-Hydroxy  Latest Ref Range: 30.0 - 100.0 ng/mL 45.4      OBESITY BEHAVIORAL INTERVENTION VISIT  Today's visit was # 6   Starting weight: 244 lbs Starting date: 08/19/2018 Today's weight : Weight: 233 lbs Today's date: 12/17/2018 Total lbs lost to date: 11     12/17/2018  BP 123/85  Temp 98.2 F (36.8 C)  Pulse 57 (A)  SpO2 100 %  Weight 233 lb  Height 5\' 3"  (1.6 m)  BMI (Calculated) 41.28    ASK: We discussed the diagnosis of obesity with Letta B Woody today and Girtrude agreed to give [name] permission to discuss obesity behavioral modification therapy today.  ASSESS: Safiyya has the diagnosis of obesity and her BMI today is 41.28 Vercie is in the action stage of change   ADVISE: Samiah was educated on the multiple health risks of obesity as well as the benefit of weight  loss to improve her health. She was advised of the need for long term treatment and the importance of lifestyle modifications to improve her current health and to decrease her risk of future health problems.  AGREE: Multiple dietary modification options and treatment options were discussed and  Jersi agreed to follow the recommendations documented in the above note.  ARRANGE: Arisbeth was educated on the importance of frequent visits to treat obesity as outlined per CMS and USPSTF guidelines and agreed to schedule her next follow up appointment today.  I, Tammy Wysor, am acting as Location manager for [employer], FNP-C.  I have reviewed the above documentation for accuracy and completeness, and I agree with the above.  - Ailanie Ruttan, FNP-C.

## 2018-12-18 DIAGNOSIS — M25561 Pain in right knee: Secondary | ICD-10-CM | POA: Diagnosis not present

## 2018-12-18 DIAGNOSIS — Z96651 Presence of right artificial knee joint: Secondary | ICD-10-CM | POA: Diagnosis not present

## 2018-12-18 LAB — INSULIN, RANDOM: INSULIN: 25.6 u[IU]/mL — ABNORMAL HIGH (ref 2.6–24.9)

## 2018-12-18 LAB — LIPID PANEL WITH LDL/HDL RATIO
Cholesterol, Total: 171 mg/dL (ref 100–199)
HDL: 55 mg/dL (ref >39)
LDL Calculated: 98 mg/dL (ref 0–99)
LDl/HDL Ratio: 1.8 ratio (ref 0.0–3.2)
Triglycerides: 92 mg/dL (ref 0–149)
VLDL Cholesterol Cal: 18 mg/dL (ref 5–40)

## 2018-12-18 LAB — HEMOGLOBIN A1C
Est. average glucose Bld gHb Est-mCnc: 114 mg/dL
Hgb A1c MFr Bld: 5.6 % (ref 4.8–5.6)

## 2018-12-18 LAB — VITAMIN D 25 HYDROXY (VIT D DEFICIENCY, FRACTURES): Vit D, 25-Hydroxy: 69.1 ng/mL (ref 30.0–100.0)

## 2018-12-20 DIAGNOSIS — M25561 Pain in right knee: Secondary | ICD-10-CM | POA: Diagnosis not present

## 2018-12-20 DIAGNOSIS — Z96651 Presence of right artificial knee joint: Secondary | ICD-10-CM | POA: Diagnosis not present

## 2018-12-24 DIAGNOSIS — Z96651 Presence of right artificial knee joint: Secondary | ICD-10-CM | POA: Diagnosis not present

## 2018-12-24 DIAGNOSIS — M25561 Pain in right knee: Secondary | ICD-10-CM | POA: Diagnosis not present

## 2018-12-27 DIAGNOSIS — Z96651 Presence of right artificial knee joint: Secondary | ICD-10-CM | POA: Diagnosis not present

## 2018-12-27 DIAGNOSIS — M25561 Pain in right knee: Secondary | ICD-10-CM | POA: Diagnosis not present

## 2019-01-01 DIAGNOSIS — Z96651 Presence of right artificial knee joint: Secondary | ICD-10-CM | POA: Diagnosis not present

## 2019-01-01 DIAGNOSIS — M25561 Pain in right knee: Secondary | ICD-10-CM | POA: Diagnosis not present

## 2019-01-03 DIAGNOSIS — M25561 Pain in right knee: Secondary | ICD-10-CM | POA: Diagnosis not present

## 2019-01-03 DIAGNOSIS — Z96651 Presence of right artificial knee joint: Secondary | ICD-10-CM | POA: Diagnosis not present

## 2019-01-07 DIAGNOSIS — Z96651 Presence of right artificial knee joint: Secondary | ICD-10-CM | POA: Diagnosis not present

## 2019-01-07 DIAGNOSIS — M25561 Pain in right knee: Secondary | ICD-10-CM | POA: Diagnosis not present

## 2019-01-10 DIAGNOSIS — Z96651 Presence of right artificial knee joint: Secondary | ICD-10-CM | POA: Diagnosis not present

## 2019-01-10 DIAGNOSIS — M25561 Pain in right knee: Secondary | ICD-10-CM | POA: Diagnosis not present

## 2019-01-13 ENCOUNTER — Encounter (INDEPENDENT_AMBULATORY_CARE_PROVIDER_SITE_OTHER): Payer: Self-pay

## 2019-01-13 DIAGNOSIS — M25561 Pain in right knee: Secondary | ICD-10-CM | POA: Diagnosis not present

## 2019-01-13 DIAGNOSIS — Z96651 Presence of right artificial knee joint: Secondary | ICD-10-CM | POA: Diagnosis not present

## 2019-01-14 ENCOUNTER — Ambulatory Visit (INDEPENDENT_AMBULATORY_CARE_PROVIDER_SITE_OTHER): Payer: 59 | Admitting: Family Medicine

## 2019-01-14 ENCOUNTER — Encounter (INDEPENDENT_AMBULATORY_CARE_PROVIDER_SITE_OTHER): Payer: Self-pay

## 2019-01-14 ENCOUNTER — Encounter (INDEPENDENT_AMBULATORY_CARE_PROVIDER_SITE_OTHER): Payer: Self-pay | Admitting: Family Medicine

## 2019-01-14 ENCOUNTER — Other Ambulatory Visit: Payer: Self-pay

## 2019-01-14 DIAGNOSIS — E559 Vitamin D deficiency, unspecified: Secondary | ICD-10-CM | POA: Diagnosis not present

## 2019-01-14 DIAGNOSIS — I10 Essential (primary) hypertension: Secondary | ICD-10-CM | POA: Diagnosis not present

## 2019-01-14 DIAGNOSIS — Z6841 Body Mass Index (BMI) 40.0 and over, adult: Secondary | ICD-10-CM

## 2019-01-14 MED ORDER — HYDROCHLOROTHIAZIDE 25 MG PO TABS: 25.0000 mg | ORAL_TABLET | Freq: Every day | ORAL | 0 refills | Status: DC

## 2019-01-14 NOTE — Progress Notes (Addendum)
Office: [phone]  /  Fax: [phone] TeleHealth Visit:  [name] has consented to this TeleHealth visit today via telephone call. The patient is located at home, the provider is located at the [facility] office. The participants in this visit include the listed provider and patient and any and all parties involved.   HPI:   Chief Complaint: OBESITY Amy Mooney is here to discuss her progress with her obesity treatment plan. She is on the Category 2 plan with lunch and breakfast options and is following her eating plan approximately 50 to 60 % of the time. She states she is doing physical therapy 60 minutes 2 times per week. Jenavieve notes that COVID-19 is making it harder to find appropriate food. She does not do well with journaling, so she does not feel this is a good option for her. Anhthu feels she has to work on better compliance with the meal plan.  We were unable to weight the patient today for this TeleHealth visit.She feels as if she has maintained weight since her last visit. She has lost 11 lbs since starting treatment with [name].  Hypertension Amy Mooney is a 53 y.o. female with hypertension. Her blood pressure is at 147/79 today at home.  Amy Mooney denies chest pain or shortness of breath on exertion. She has some swelling in her right leg and she had recent right total knee replacement. She is working weight loss to help control her blood pressure with the goal of decreasing her risk of heart attack and stroke. Hollies blood pressure is not currently controlled.  Vitamin D deficiency Amy Mooney has a diagnosis of vitamin D deficiency. Her last vitamin D level was at 69.1 on 12/17/18 and was at goal. She is currently taking OTC vit D 5,000 IU daily. Sharnee denies nausea, vomiting or muscle weakness.  ASSESSMENT AND PLAN:  Essential hypertension - Plan: hydrochlorothiazide (HYDRODIURIL) 25 MG tablet  Vitamin D deficiency  Class 3 severe obesity with serious  comorbidity and body mass index (BMI) of 40.0 to 44.9 in adult, unspecified obesity type ([place])  PLAN:  Hypertension We discussed sodium restriction, working on healthy weight loss, and a regular exercise program as the means to achieve improved blood pressure control. [name] agreed with this plan and agreed to follow up as directed. We will continue to monitor her blood pressure as well as her progress with the above lifestyle modifications. She will check her blood pressure at home 3 times per week and record. She agrees to continue HCTZ 25 mg daily #30 with no refills and will watch for signs of hypotension as she continues her lifestyle modifications.  Vitamin D Deficiency [name] was informed that low vitamin D levels contributes to fatigue and are associated with obesity, breast, and colon cancer. She will continue to take OTC Vit D @5 ,000 IU daily and will follow up for routine testing of vitamin D, at least 2-3 times per year. She was informed of the risk of over-replacement of vitamin D and agrees to not increase her dose unless she discusses this with [name] first.  Obesity Amy Mooney is currently in the action stage of change. As such, her goal is to continue with weight loss efforts She has agreed to follow the Category 2 plan with lunch and breakfast options Amy Mooney will continue physical therapy two times weekly. We discussed the following Behavioral Modification Strategies today: planning for success, keeping healthy foods in the home, increasing lean protein intake and decreasing simple  carbohydrates   We discussed carb exchanges for bread today.  Amy Mooney has agreed to follow up with our clinic in 2 to 3 weeks. She was informed of the importance of frequent follow up visits to maximize her success with intensive lifestyle modifications for her multiple health conditions.  ALLERGIES: Allergies  Allergen Reactions  . Nuvigil [Armodafinil] Hives  . Penicillins Rash    Has patient had a PCN  reaction causing immediate rash, facial/tongue/throat swelling, SOB or lightheadedness with hypotension: Unknown Has patient had a PCN reaction causing severe rash involving mucus membranes or skin necrosis: Unknown Has patient had a PCN reaction that required hospitalization: Unknown Has patient had a PCN reaction occurring within the last 10 years: Unknown If all of the above answers are "NO", then may proceed with Cephalosporin use.   . Vancomycin Hives  . Lyrica [Pregabalin] Hives    MEDICATIONS: Current Outpatient Medications on File Prior to Visit  Medication Sig Dispense Refill  . Calcium Carb-Cholecalciferol (CALCIUM 600+D) 600-800 MG-UNIT TABS Take 1 tablet by mouth daily.    . Cholecalciferol (VITAMIN D3) 5000 units CAPS Take 1 capsule by mouth daily.    . Diclofenac Sodium (PENNSAID) 2 % SOLN Pennsaid 20 mg/gram/actuation (2 %) topical soln in metered-dose pump  apply 2 PUMPS twice daily (Patient taking differently: Apply 2 Pump topically 2 (two) times daily as needed (PAIN). Pennsaid 20 mg/gram/actuation (2 %) topical soln in metered-dose pump  apply 2 PUMPS twice daily) 112 g 0  . docusate sodium (COLACE) 100 MG capsule Take 1 capsule (100 mg total) by mouth 2 (two) times daily. 10 capsule 0  . doxycycline (VIBRAMYCIN) 100 MG capsule Take 100 mg by mouth 2 (two) times daily as needed.    . fluconazole (DIFLUCAN) 100 MG tablet Take 100 mg by mouth daily as needed (FINGER).     Marland Kitchen HYDROcodone-acetaminophen (NORCO) 7.5-325 MG tablet Take 1 tablet by mouth every 8 (eight) hours as needed for moderate pain. 42 tablet 0  . loratadine (CLARITIN) 10 MG tablet Take 10 mg by mouth daily as needed for allergies.    . Multiple Vitamins-Minerals (HAIR SKIN AND NAILS FORMULA PO) Take 1 tablet by mouth daily.    . Multiple Vitamins-Minerals (MULTIVITAMIN WITH MINERALS) tablet Take 1 tablet by mouth daily.    . pramipexole (MIRAPEX) 0.25 MG tablet TAKE 1 TABLET BY MOUTH 30 MINUTES TO 1 HOUR  PRIOR TO BEDTIME 90 tablet 1  . pregabalin (LYRICA) 50 MG capsule Take 50 mg by mouth 3 (three) times daily.    . rizatriptan (MAXALT) 10 MG tablet TAKE 1 TABLET BY MOUTH AT MIGRAINE ONSET MAY REPEAT IN 2 HOURS IF HEADACHE PERSISTS.NOT EXCEED 2 TABLETS IN 24HRS (Patient taking differently: Take 10 mg by mouth as needed for migraine. ) 10 tablet 0  . tiZANidine (ZANAFLEX) 4 MG tablet Take 1 tablet (4 mg total) by mouth 3 (three) times daily. 90 tablet 1  . valACYclovir (VALTREX) 1000 MG tablet Take 1,000 mg by mouth 2 (two) times daily as needed.     No current facility-administered medications on file prior to visit.     PAST MEDICAL HISTORY: Past Medical History:  Diagnosis Date  . Chronic knee pain    arthritis  . Fever blister    history of  . History of kidney stones   . Hypertension   . Migraines   . Morbid obesity with BMI of 40.0-44.9, adult ([place])   . 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 Oral herpes   .  Osteoarthritis   . Restless leg   . Sleep apnea    per pt not treated; not able to use CPAP due to claustrophobia  . Vitamin D deficiency     PAST SURGICAL HISTORY: Past Surgical History:  Procedure Laterality Date  . ABDOMINAL HYSTERECTOMY  2010  . artroscopic meniscus repair Bilateral   . cervix removed, bladder tack     2015  . COLONOSCOPY WITH PROPOFOL N/A 06/19/2017   Procedure: COLONOSCOPY WITH PROPOFOL;  Surgeon: [name], MD;  Location: [facility] ENDOSCOPY;  Service: Endoscopy;  Laterality: N/A;  . LAPAROSCOPIC SALPINGO OOPHERECTOMY Right 03/22/2017   Procedure: LAPAROSCOPIC SALPINGO OOPHORECTOMY;  Surgeon: Ward, Honor Loh, MD;  Location: [facility] ORS;  Service: Gynecology;  Laterality: Right;  . TOTAL KNEE ARTHROPLASTY Right 10/29/2018   Procedure: TOTAL KNEE ARTHROPLASTY;  Surgeon: Carole Civil, MD;  Location: [facility] ORS;  Service: Orthopedics;  Laterality: Right;    SOCIAL HISTORY: Social History   Tobacco Use  . Smoking status: Former Smoker     Packs/day: 0.50    Years: 4.00    Pack years: 2.00    Last attempt to quit: 10/23/1986    Years since quitting: 32.2  . Smokeless tobacco: Never Used  Substance Use Topics  . Alcohol use: No  . Drug use: No    FAMILY HISTORY: Family History  Problem Relation Age of Onset  . Breast cancer Mother 53  . Diabetes Mother   . Depression Mother   . Anxiety disorder Mother   . Obesity Mother   . Breast cancer Paternal Grandmother     ROS: Review of Systems  Constitutional: Negative for weight loss.  Respiratory: Negative for shortness of breath (on exertion).   Cardiovascular: Negative for chest pain.  Gastrointestinal: Negative for nausea and vomiting.  Musculoskeletal:       Negative for muscle weakness    PHYSICAL EXAM: Pt in no acute distress  RECENT LABS AND TESTS: BMET    Component Value Date/Time   NA 138 10/30/2018 0502   NA 141 08/19/2018 1005   NA 137 05/23/2013 1515   K 3.1 (L) 10/30/2018 0502   K 3.9 05/23/2013 1515   CL 104 10/30/2018 0502   CL 104 05/23/2013 1515   CO2 26 10/30/2018 0502   CO2 35 (H) 05/23/2013 1515   GLUCOSE 124 (H) 10/30/2018 0502   GLUCOSE 68 05/23/2013 1515   BUN 14 10/30/2018 0502   BUN 18 08/19/2018 1005   BUN 10 05/23/2013 1515   CREATININE 0.82 10/30/2018 0502   CREATININE 0.86 05/23/2013 1515   CALCIUM 8.3 (L) 10/30/2018 0502   CALCIUM 8.4 (L) 05/23/2013 1515   GFRNONAA >60 10/30/2018 0502   GFRNONAA >60 05/23/2013 1515   GFRAA >60 10/30/2018 0502   GFRAA >60 05/23/2013 1515   Lab Results  Component Value Date   HGBA1C 5.6 12/17/2018   HGBA1C 5.7 (H) 08/19/2018   HGBA1C 5.9 06/03/2018   HGBA1C 5.8 04/27/2017   HGBA1C 5.7 03/24/2016   Lab Results  Component Value Date   INSULIN 25.6 (H) 12/17/2018   INSULIN 29.0 (H) 08/19/2018   CBC    Component Value Date/Time   WBC 13.4 (H) 10/30/2018 0502   RBC 3.87 10/30/2018 0502   HGB 10.5 (L) 10/30/2018 0502   HGB 13.1 08/19/2018 1005   HCT 33.7 (L) 10/30/2018 0502    HCT 39.9 08/19/2018 1005   PLT 215 10/30/2018 0502   PLT 225 11/17/2016 1000   MCV 87.1 10/30/2018 0502  MCV 85 08/19/2018 1005   MCV 83 05/14/2013 1149   MCH 27.1 10/30/2018 0502   MCHC 31.2 10/30/2018 0502   RDW 13.8 10/30/2018 0502   RDW 14.0 08/19/2018 1005   RDW 14.1 05/14/2013 1149   LYMPHSABS 2.9 10/24/2018 0815   LYMPHSABS 3.1 08/19/2018 1005   MONOABS 0.5 10/24/2018 0815   EOSABS 0.2 10/24/2018 0815   EOSABS 0.2 08/19/2018 1005   BASOSABS 0.0 10/24/2018 0815   BASOSABS 0.1 08/19/2018 1005   Iron/TIBC/Ferritin/ %Sat    Component Value Date/Time   IRON 50 06/24/2014 1542   TIBC 328 06/24/2014 1542   FERRITIN 130 06/24/2014 1542   IRONPCTSAT 15 06/24/2014 1542   Lipid Panel     Component Value Date/Time   CHOL 171 12/17/2018 1034   TRIG 92 12/17/2018 1034   HDL 55 12/17/2018 1034   CHOLHDL 3 06/03/2018 0746   VLDL 28.0 06/03/2018 0746   LDLCALC 98 12/17/2018 1034   Hepatic Function Panel     Component Value Date/Time   PROT 7.3 08/19/2018 1005   ALBUMIN 4.5 08/19/2018 1005   AST 28 08/19/2018 1005   ALT 33 (H) 08/19/2018 1005   ALKPHOS 115 08/19/2018 1005   BILITOT 0.6 08/19/2018 1005      Component Value Date/Time   TSH 2.850 08/19/2018 1005   TSH 1.70 03/24/2016 1259   Results for SHAMBHAVI, SALLEY (MRN [id number]) as of 01/14/2019 13:58  Ref. Range 12/17/2018 10:34  Vitamin D, 25-Hydroxy Latest Ref Range: 30.0 - 100.0 ng/mL 69.1     I, Doreene Nest, am acting as Location manager for [employer], FNP-C.  I have reviewed the above documentation for accuracy and completeness, and I agree with the above.  -  , FNP-C.

## 2019-01-15 ENCOUNTER — Encounter (INDEPENDENT_AMBULATORY_CARE_PROVIDER_SITE_OTHER): Payer: Self-pay | Admitting: Family Medicine

## 2019-01-17 DIAGNOSIS — M25561 Pain in right knee: Secondary | ICD-10-CM | POA: Diagnosis not present

## 2019-01-17 DIAGNOSIS — Z96651 Presence of right artificial knee joint: Secondary | ICD-10-CM | POA: Diagnosis not present

## 2019-01-20 DIAGNOSIS — M25561 Pain in right knee: Secondary | ICD-10-CM | POA: Diagnosis not present

## 2019-01-20 DIAGNOSIS — Z96651 Presence of right artificial knee joint: Secondary | ICD-10-CM | POA: Diagnosis not present

## 2019-01-23 DIAGNOSIS — M25561 Pain in right knee: Secondary | ICD-10-CM | POA: Diagnosis not present

## 2019-01-23 DIAGNOSIS — Z96651 Presence of right artificial knee joint: Secondary | ICD-10-CM | POA: Diagnosis not present

## 2019-01-24 ENCOUNTER — Encounter: Payer: Self-pay | Admitting: Orthopedic Surgery

## 2019-01-24 ENCOUNTER — Other Ambulatory Visit: Payer: Self-pay

## 2019-01-24 ENCOUNTER — Ambulatory Visit (INDEPENDENT_AMBULATORY_CARE_PROVIDER_SITE_OTHER): Payer: 59 | Admitting: Orthopedic Surgery

## 2019-01-24 VITALS — Temp 97.9°F | Ht 63.0 in | Wt 233.0 lb

## 2019-01-24 DIAGNOSIS — Z96651 Presence of right artificial knee joint: Secondary | ICD-10-CM

## 2019-01-24 DIAGNOSIS — M1712 Unilateral primary osteoarthritis, left knee: Secondary | ICD-10-CM

## 2019-01-24 NOTE — Progress Notes (Signed)
Chief Complaint  Patient presents with  . Routine Post Op    right knee replacement 10/29/18     -3 months post op: day 30   Amy Mooney more complains of the left knee than the right knee she is concerned about her right knee flexion which is 110 degrees measured in the office and by therapy yesterday  Has trouble getting in and out of the car putting on her shoes but otherwise can walk well with minimal discomfort  Recommend 3 months virtual visit  Patient will need left total knee when COVID-19 restrictions lifted and she can fit it into her schedule  Encounter Diagnoses  Name Primary?  . S/P total knee replacement, right 10/29/18 Yes  . Primary osteoarthritis of left knee

## 2019-01-27 ENCOUNTER — Ambulatory Visit (INDEPENDENT_AMBULATORY_CARE_PROVIDER_SITE_OTHER): Payer: 59 | Admitting: Family Medicine

## 2019-01-28 ENCOUNTER — Ambulatory Visit (INDEPENDENT_AMBULATORY_CARE_PROVIDER_SITE_OTHER): Payer: 59 | Admitting: Family Medicine

## 2019-01-28 DIAGNOSIS — Z96651 Presence of right artificial knee joint: Secondary | ICD-10-CM | POA: Diagnosis not present

## 2019-01-28 DIAGNOSIS — M25561 Pain in right knee: Secondary | ICD-10-CM | POA: Diagnosis not present

## 2019-01-28 MED FILL — DOXYCYCLINE HYCLATE 100 MG: 100 | 30 days supply | Qty: 30 | Fill #0

## 2019-02-03 ENCOUNTER — Other Ambulatory Visit: Payer: Self-pay

## 2019-02-03 ENCOUNTER — Encounter (INDEPENDENT_AMBULATORY_CARE_PROVIDER_SITE_OTHER): Payer: Self-pay | Admitting: Family Medicine

## 2019-02-03 ENCOUNTER — Ambulatory Visit (INDEPENDENT_AMBULATORY_CARE_PROVIDER_SITE_OTHER): Payer: 59 | Admitting: Family Medicine

## 2019-02-03 DIAGNOSIS — Z6841 Body Mass Index (BMI) 40.0 and over, adult: Secondary | ICD-10-CM | POA: Diagnosis not present

## 2019-02-03 DIAGNOSIS — I10 Essential (primary) hypertension: Secondary | ICD-10-CM

## 2019-02-03 DIAGNOSIS — M25561 Pain in right knee: Secondary | ICD-10-CM | POA: Diagnosis not present

## 2019-02-03 DIAGNOSIS — F3289 Other specified depressive episodes: Secondary | ICD-10-CM | POA: Diagnosis not present

## 2019-02-03 DIAGNOSIS — E8881 Metabolic syndrome: Secondary | ICD-10-CM

## 2019-02-03 DIAGNOSIS — Z96651 Presence of right artificial knee joint: Secondary | ICD-10-CM | POA: Diagnosis not present

## 2019-02-03 DIAGNOSIS — E88819 Insulin resistance, unspecified: Secondary | ICD-10-CM | POA: Insufficient documentation

## 2019-02-03 DIAGNOSIS — F329 Major depressive disorder, single episode, unspecified: Secondary | ICD-10-CM | POA: Insufficient documentation

## 2019-02-03 DIAGNOSIS — F32A Depression, unspecified: Secondary | ICD-10-CM | POA: Insufficient documentation

## 2019-02-03 MED ORDER — HYDROCHLOROTHIAZIDE 25 MG PO TABS: 25.0000 mg | ORAL_TABLET | Freq: Every day | ORAL | 0 refills | Status: DC

## 2019-02-03 MED ORDER — BUPROPION HCL ER (SR) 150 MG PO TB12: 150.0000 mg | ORAL_TABLET | Freq: Every day | ORAL | 0 refills | Status: DC

## 2019-02-03 MED FILL — BUPROPION HCL SR 150 MG TAB: 150 | 30 days supply | Qty: 30 | Fill #0

## 2019-02-03 MED FILL — HYDROCHLOROTHIAZIDE 25 MG T: 25 | 30 days supply | Qty: 30 | Fill #0

## 2019-02-03 NOTE — Progress Notes (Signed)
Office: [phone]  /  Fax: [phone] TeleHealth Visit:  Amy Mooney has verbally consented to this TeleHealth visit today. The patient is located at home, the provider is located at the [employer] [facility] office. The participants in this visit include the listed provider and patient. The visit was conducted today via FaceTime.  HPI:   Chief Complaint: OBESITY Amy Mooney is here to discuss her progress with her obesity treatment plan. She is on the Category 2 plan with breakfast and lunch options and is following her eating plan approximately 50% of the time. She states she is doing physical therapy 60 minutes 2 times per week. Amy Mooney states she is stressed due to COVID-19. She reports weighing 235 lbs at home today. She feels she is off track. She reports getting her protein in and is snacking more at night. She states she is still working, though she has reduced hours. We were unable to weigh the patient today for this TeleHealth visit. She feels as if she has gained 2 lbs since her last visit. She has lost 11 lbs since starting treatment with [name].  Hypertension Amy Mooney is a 53 y.o. female with hypertension, moderately well controlled on HCTZ.  Amy Mooney denies chest pain or shortness of breath on exertion. She is working weight loss to help control her blood pressure with the goal of decreasing her risk of heart attack and stroke. Amy Mooney's blood pressure today is 143/91.  Insulin Resistance Amy Mooney has a diagnosis of insulin resistance based on her elevated fasting insulin level >5. Although Amy Mooney's blood glucose readings are still under good control, insulin resistance puts her at greater risk of metabolic syndrome and diabetes. She is not taking metformin currently and continues to work on diet and exercise to decrease risk of diabetes. She denies polyphagia.  Depression with emotional eating behaviors (New Diagnosis) Amy Mooney is struggling with increased stress eating  with COVID-19 and using food for comfort to the extent that it is negatively impacting her health. She often snacks when she is not hungry, more at night. Amy Mooney sometimes feels she is out of control and then feels guilty that she made poor food choices. She has been working on behavior modification techniques to help reduce her emotional eating and has been somewhat successful. She shows no sign of suicidal or homicidal ideations.  Depression screen [facility] 2/9 08/19/2018 06/07/2018 05/04/2017  Decreased Interest 2 1 0  Down, Depressed, Hopeless 1 3 0  PHQ - 2 Score 3 4 0  Altered sleeping 2 0 -  Tired, decreased energy 3 2 -  Change in appetite 2 0 -  Feeling bad or failure about yourself  1 0 -  Trouble concentrating 2 0 -  Moving slowly or fidgety/restless 3 1 -  Suicidal thoughts 0 0 -  PHQ-9 Score 16 7 -  Difficult doing work/chores Somewhat difficult Somewhat difficult -   ASSESSMENT AND PLAN:  Essential hypertension - Plan: hydrochlorothiazide (HYDRODIURIL) 25 MG tablet  Other depression - with emotional eating - Plan: buPROPion (WELLBUTRIN SR) 150 MG 12 hr tablet  Insulin resistance  Class 3 severe obesity with serious comorbidity and body mass index (BMI) of 40.0 to 44.9 in adult, unspecified obesity type (HCC)  PLAN:  Hypertension We discussed sodium restriction, working on healthy weight loss, and a regular exercise program as the means to achieve improved blood pressure control. Amy Mooney agreed with this plan and agreed to follow up as directed. We will continue to monitor  her blood pressure as well as her progress with the above lifestyle modifications. She was given a refill on her HCTZ 25 mg QD #30 with 0 refills and agrees to follow-up with our clinic in 2 weeks. She will continue checking her blood pressure at home and watch for signs of hypotension as she continues her lifestyle modifications.  Insulin Resistance Amy Mooney will continue to work on weight loss, exercise, and  decreasing simple carbohydrates in her diet to help decrease the risk of diabetes. We dicussed metformin including benefits and risks. She was informed that eating too many simple carbohydrates or too many calories at one sitting increases the likelihood of GI side effects. Amy Mooney is not on metformin and prescription was not written today. Amy Mooney agreed to follow-up with [name] as directed to monitor her progress.  Depression with Emotional Eating Behaviors We discussed behavior modification techniques today to help [facility] deal with her emotional eating and depression. She was given a new RX for bupropion SR 150 mg QAM #30 with 0 refills and agrees to follow-up with our clinic in 2 weeks. She will continue to maintain blood pressure.  Obesity Amy Mooney is currently in the action stage of change. As such, her goal is to continue with weight loss efforts. She has agreed to follow the Category 2 plan with breakfast and lunch options. Amy Mooney has been instructed to continue PT twice weekly. We discussed the following Behavioral Modification Strategies today: work on meal planning, easy cooking plans, better snacking choices, emotional eating strategies, and planning for success.  Amy Mooney has agreed to follow-up with our clinic in 2 weeks. She was informed of the importance of frequent follow-up visits to maximize her success with intensive lifestyle modifications for her multiple health conditions.  ALLERGIES: Allergies  Allergen Reactions  . Nuvigil [Armodafinil] Hives  . Penicillins Rash    Has patient had a PCN reaction causing immediate rash, facial/tongue/throat swelling, SOB or lightheadedness with hypotension: Unknown Has patient had a PCN reaction causing severe rash involving mucus membranes or skin necrosis: Unknown Has patient had a PCN reaction that required hospitalization: Unknown Has patient had a PCN reaction occurring within the last 10 years: Unknown If all of the above answers are "NO",  then may proceed with Cephalosporin use.   . Vancomycin Hives  . Lyrica [Pregabalin] Hives    MEDICATIONS: Current Outpatient Medications on File Prior to Visit  Medication Sig Dispense Refill  . Calcium Carb-Cholecalciferol (CALCIUM 600+D) 600-800 MG-UNIT TABS Take 1 tablet by mouth daily.    . Cholecalciferol (VITAMIN D3) 5000 units CAPS Take 1 capsule by mouth daily.    . Diclofenac Sodium (PENNSAID) 2 % SOLN Pennsaid 20 mg/gram/actuation (2 %) topical soln in metered-dose pump  apply 2 PUMPS twice daily (Patient taking differently: Apply 2 Pump topically 2 (two) times daily as needed (PAIN). Pennsaid 20 mg/gram/actuation (2 %) topical soln in metered-dose pump  apply 2 PUMPS twice daily) 112 g 0  . doxycycline (VIBRAMYCIN) 100 MG capsule Take 100 mg by mouth 2 (two) times daily as needed.    . fluconazole (DIFLUCAN) 100 MG tablet Take 100 mg by mouth daily as needed (FINGER).     Marland Kitchen loratadine (CLARITIN) 10 MG tablet Take 10 mg by mouth daily as needed for allergies.    . Multiple Vitamins-Minerals (HAIR SKIN AND NAILS FORMULA PO) Take 1 tablet by mouth daily.    . Multiple Vitamins-Minerals (MULTIVITAMIN WITH MINERALS) tablet Take 1 tablet by mouth daily.    . pramipexole (  MIRAPEX) 0.25 MG tablet TAKE 1 TABLET BY MOUTH 30 MINUTES TO 1 HOUR PRIOR TO BEDTIME 90 tablet 1  . rizatriptan (MAXALT) 10 MG tablet TAKE 1 TABLET BY MOUTH AT MIGRAINE ONSET MAY REPEAT IN 2 HOURS IF HEADACHE PERSISTS.NOT EXCEED 2 TABLETS IN 24HRS (Patient taking differently: Take 10 mg by mouth as needed for migraine. ) 10 tablet 0  . valACYclovir (VALTREX) 1000 MG tablet Take 1,000 mg by mouth 2 (two) times daily as needed.     No current facility-administered medications on file prior to visit.     PAST MEDICAL HISTORY: Past Medical History:  Diagnosis Date  . Chronic knee pain    arthritis  . Fever blister    history of  . History of kidney stones   . Hypertension   . Migraines   . Morbid obesity with  BMI of 40.0-44.9, adult ([place])   . 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 Oral herpes   . Osteoarthritis   . Restless leg   . Sleep apnea    per pt not treated; not able to use CPAP due to claustrophobia  . Vitamin D deficiency     PAST SURGICAL HISTORY: Past Surgical History:  Procedure Laterality Date  . ABDOMINAL HYSTERECTOMY  2010  . artroscopic meniscus repair Bilateral   . cervix removed, bladder tack     2015  . COLONOSCOPY WITH PROPOFOL N/A 06/19/2017   Procedure: COLONOSCOPY WITH PROPOFOL;  Surgeon: [name], MD;  Location: [facility] ENDOSCOPY;  Service: Endoscopy;  Laterality: N/A;  . LAPAROSCOPIC SALPINGO OOPHERECTOMY Right 03/22/2017   Procedure: LAPAROSCOPIC SALPINGO OOPHORECTOMY;  Surgeon: Ward, Honor Loh, MD;  Location: [facility] ORS;  Service: Gynecology;  Laterality: Right;  . TOTAL KNEE ARTHROPLASTY Right 10/29/2018   Procedure: TOTAL KNEE ARTHROPLASTY;  Surgeon: Carole Civil, MD;  Location: [facility] ORS;  Service: Orthopedics;  Laterality: Right;    SOCIAL HISTORY: Social History   Tobacco Use  . Smoking status: Former Smoker    Packs/day: 0.50    Years: 4.00    Pack years: 2.00    Last attempt to quit: 10/23/1986    Years since quitting: 32.3  . Smokeless tobacco: Never Used  Substance Use Topics  . Alcohol use: No  . Drug use: No    FAMILY HISTORY: Family History  Problem Relation Age of Onset  . Breast cancer Mother 22  . Diabetes Mother   . Depression Mother   . Anxiety disorder Mother   . Obesity Mother   . Breast cancer Paternal Grandmother    ROS: Review of Systems  Respiratory: Negative for shortness of breath.   Cardiovascular: Negative for chest pain.  Endo/Heme/Allergies:       Negative for polyphagia.  Psychiatric/Behavioral: Positive for depression (emotional eating). Negative for suicidal ideas.       Negative for homicidal ideas.   PHYSICAL EXAM: Pt in no acute distress  RECENT LABS AND TESTS: BMET    Component Value Date/Time    NA 138 10/30/2018 0502   NA 141 08/19/2018 1005   NA 137 05/23/2013 1515   K 3.1 (L) 10/30/2018 0502   K 3.9 05/23/2013 1515   CL 104 10/30/2018 0502   CL 104 05/23/2013 1515   CO2 26 10/30/2018 0502   CO2 35 (H) 05/23/2013 1515   GLUCOSE 124 (H) 10/30/2018 0502   GLUCOSE 68 05/23/2013 1515   BUN 14 10/30/2018 0502   BUN 18 08/19/2018 1005   BUN 10 05/23/2013 1515   CREATININE  0.82 10/30/2018 0502   CREATININE 0.86 05/23/2013 1515   CALCIUM 8.3 (L) 10/30/2018 0502   CALCIUM 8.4 (L) 05/23/2013 1515   GFRNONAA >60 10/30/2018 0502   GFRNONAA >60 05/23/2013 1515   GFRAA >60 10/30/2018 0502   GFRAA >60 05/23/2013 1515   Lab Results  Component Value Date   HGBA1C 5.6 12/17/2018   HGBA1C 5.7 (H) 08/19/2018   HGBA1C 5.9 06/03/2018   HGBA1C 5.8 04/27/2017   HGBA1C 5.7 03/24/2016   Lab Results  Component Value Date   INSULIN 25.6 (H) 12/17/2018   INSULIN 29.0 (H) 08/19/2018   CBC    Component Value Date/Time   WBC 13.4 (H) 10/30/2018 0502   RBC 3.87 10/30/2018 0502   HGB 10.5 (L) 10/30/2018 0502   HGB 13.1 08/19/2018 1005   HCT 33.7 (L) 10/30/2018 0502   HCT 39.9 08/19/2018 1005   PLT 215 10/30/2018 0502   PLT 225 11/17/2016 1000   MCV 87.1 10/30/2018 0502   MCV 85 08/19/2018 1005   MCV 83 05/14/2013 1149   MCH 27.1 10/30/2018 0502   MCHC 31.2 10/30/2018 0502   RDW 13.8 10/30/2018 0502   RDW 14.0 08/19/2018 1005   RDW 14.1 05/14/2013 1149   LYMPHSABS 2.9 10/24/2018 0815   LYMPHSABS 3.1 08/19/2018 1005   MONOABS 0.5 10/24/2018 0815   EOSABS 0.2 10/24/2018 0815   EOSABS 0.2 08/19/2018 1005   BASOSABS 0.0 10/24/2018 0815   BASOSABS 0.1 08/19/2018 1005   Iron/TIBC/Ferritin/ %Sat    Component Value Date/Time   IRON 50 06/24/2014 1542   TIBC 328 06/24/2014 1542   FERRITIN 130 06/24/2014 1542   IRONPCTSAT 15 06/24/2014 1542   Lipid Panel     Component Value Date/Time   CHOL 171 12/17/2018 1034   TRIG 92 12/17/2018 1034   HDL 55 12/17/2018 1034   CHOLHDL 3  06/03/2018 0746   VLDL 28.0 06/03/2018 0746   LDLCALC 98 12/17/2018 1034   Hepatic Function Panel     Component Value Date/Time   PROT 7.3 08/19/2018 1005   ALBUMIN 4.5 08/19/2018 1005   AST 28 08/19/2018 1005   ALT 33 (H) 08/19/2018 1005   ALKPHOS 115 08/19/2018 1005   BILITOT 0.6 08/19/2018 1005      Component Value Date/Time   TSH 2.850 08/19/2018 1005   TSH 1.70 03/24/2016 1259   Results for HEDWIG, MCFALL (MRN [id number]) as of 02/03/2019 11:22  Ref. Range 12/17/2018 10:34  Vitamin D, 25-Hydroxy Latest Ref Range: 30.0 - 100.0 ng/mL 69.1    I, Michaelene Song, am acting as Location manager for [employer], FNP-C.  I have reviewed the above documentation for accuracy and completeness, and I agree with the above.  - Kelse Ploch, FNP-C.

## 2019-02-05 DIAGNOSIS — Z96651 Presence of right artificial knee joint: Secondary | ICD-10-CM | POA: Diagnosis not present

## 2019-02-05 DIAGNOSIS — M25561 Pain in right knee: Secondary | ICD-10-CM | POA: Diagnosis not present

## 2019-02-12 DIAGNOSIS — D1800 Hemangioma unspecified site: Secondary | ICD-10-CM | POA: Diagnosis not present

## 2019-02-12 DIAGNOSIS — L905 Scar conditions and fibrosis of skin: Secondary | ICD-10-CM | POA: Diagnosis not present

## 2019-02-12 DIAGNOSIS — L308 Other specified dermatitis: Secondary | ICD-10-CM | POA: Diagnosis not present

## 2019-02-12 DIAGNOSIS — L814 Other melanin hyperpigmentation: Secondary | ICD-10-CM | POA: Diagnosis not present

## 2019-02-12 DIAGNOSIS — D229 Melanocytic nevi, unspecified: Secondary | ICD-10-CM | POA: Diagnosis not present

## 2019-02-12 DIAGNOSIS — Z1283 Encounter for screening for malignant neoplasm of skin: Secondary | ICD-10-CM | POA: Diagnosis not present

## 2019-02-12 DIAGNOSIS — L818 Other specified disorders of pigmentation: Secondary | ICD-10-CM | POA: Diagnosis not present

## 2019-02-12 DIAGNOSIS — D485 Neoplasm of uncertain behavior of skin: Secondary | ICD-10-CM | POA: Diagnosis not present

## 2019-02-18 ENCOUNTER — Encounter (INDEPENDENT_AMBULATORY_CARE_PROVIDER_SITE_OTHER): Payer: Self-pay | Admitting: Family Medicine

## 2019-02-18 ENCOUNTER — Other Ambulatory Visit: Payer: Self-pay

## 2019-02-18 ENCOUNTER — Ambulatory Visit (INDEPENDENT_AMBULATORY_CARE_PROVIDER_SITE_OTHER): Payer: 59 | Admitting: Family Medicine

## 2019-02-18 DIAGNOSIS — Z6841 Body Mass Index (BMI) 40.0 and over, adult: Secondary | ICD-10-CM

## 2019-02-18 DIAGNOSIS — I10 Essential (primary) hypertension: Secondary | ICD-10-CM | POA: Diagnosis not present

## 2019-02-18 DIAGNOSIS — F3289 Other specified depressive episodes: Secondary | ICD-10-CM

## 2019-02-18 MED ORDER — HYDROCHLOROTHIAZIDE 25 MG PO TABS: 25.0000 mg | ORAL_TABLET | Freq: Every day | ORAL | 0 refills | Status: DC

## 2019-02-19 ENCOUNTER — Encounter (INDEPENDENT_AMBULATORY_CARE_PROVIDER_SITE_OTHER): Payer: Self-pay | Admitting: Family Medicine

## 2019-02-19 MED FILL — DICLOFENAC SODIUM 75 MG TAB: 75 | 30 days supply | Qty: 60 | Fill #0

## 2019-02-19 NOTE — Progress Notes (Signed)
Office: [phone]  /  Fax: [phone] TeleHealth Visit:  Amy Mooney has verbally consented to this TeleHealth visit today. The patient is located at work, the provider is located at the [employer] [facility] office. The participants in this visit include the listed provider and patient. The visit was conducted today via Webex.  HPI:   Chief Complaint: OBESITY Amy Mooney is here to discuss her progress with her obesity treatment plan. She is on the Category 2 plan with breakfast and lunch options and is following her eating plan approximately 60% of the time. She states she is exercising 0 minutes 0 times per week. Amy Mooney states she weighed 236 lbs today, reflecting a 1 lb weight gain. She feels she is eating more due to being out of her routine.  We were unable to weigh the patient today for this TeleHealth visit. She states she weighed 236 lbs today. She has lost 11 lbs since starting treatment with [name].  Hypertension Amy Mooney is a 53 y.o. female with hypertension. Amy Mooney denies chest pain or shortness of breath on exertion. She is working weight loss to help control her blood pressure with the goal of decreasing her risk of heart attack and stroke. Amy Mooney's blood pressure is 146/86 today, however, hypertension is usually well controlled. BP Readings from Last 3 Encounters:  12/17/18 123/85  12/09/18 132/79  11/26/18 131/82    Depression with emotional eating behaviors Amy Mooney is struggling with emotional eating and using food for comfort to the extent that it is negatively impacting her health. She often snacks when she is not hungry. Amy Mooney sometimes feels she is out of control and then feels guilty that she made poor food choices. She has been working on behavior modification techniques to help reduce her emotional eating and has been somewhat successful. She shows no sign of suicidal or homicidal ideations. Bupropion was started 2 weeks ago for stress eating (cravings)  and it exacerbated her migraines. She reports having a history of kidney stones so Topamax is not an option.  Depression screen [facility] 2/9 08/19/2018 06/07/2018 05/04/2017  Decreased Interest 2 1 0  Down, Depressed, Hopeless 1 3 0  PHQ - 2 Score 3 4 0  Altered sleeping 2 0 -  Tired, decreased energy 3 2 -  Change in appetite 2 0 -  Feeling bad or failure about yourself  1 0 -  Trouble concentrating 2 0 -  Moving slowly or fidgety/restless 3 1 -  Suicidal thoughts 0 0 -  PHQ-9 Score 16 7 -  Difficult doing work/chores Somewhat difficult Somewhat difficult -   ASSESSMENT AND PLAN:  Essential hypertension - Plan: hydrochlorothiazide (HYDRODIURIL) 25 MG tablet  Other depression - with emotional eating  Class 3 severe obesity with serious comorbidity and body mass index (BMI) of 40.0 to 44.9 in adult, unspecified obesity type (HCC)  PLAN:  Hypertension We discussed sodium restriction, working on healthy weight loss, and a regular exercise program as the means to achieve improved blood pressure control. Amy Mooney agreed with this plan and agreed to follow up as directed. We will continue to monitor her blood pressure as well as her progress with the above lifestyle modifications. Amy Mooney was given a refill on her HCTZ 25 mg QD #30 with 0 refills and agrees to follow-up with our clinic in 2 weeks. She will watch for signs of hypotension as she continues her lifestyle modifications.  Depression with Emotional Eating Behaviors We discussed behavior modification techniques today  to help [facility] deal with her emotional eating and depression. Amy Mooney will discontinue bupropion and continue emotional eating strategies.  Obesity Amy Mooney is currently in the action stage of change. As such, her goal is to continue with weight loss efforts. She has agreed to follow the Category 2 plan. We discussed the following Behavioral Modification Strategies today: keeping healthy foods in the home, emotional eating  strategies, avoiding temptations, and planning for success.  Amy Mooney has agreed to follow-up with our clinic in 2 weeks. She was informed of the importance of frequent follow-up visits to maximize her success with intensive lifestyle modifications for her multiple health conditions.  ALLERGIES: Allergies  Allergen Reactions   Nuvigil [Armodafinil] Hives   Penicillins Rash    Has patient had a PCN reaction causing immediate rash, facial/tongue/throat swelling, SOB or lightheadedness with hypotension: Unknown Has patient had a PCN reaction causing severe rash involving mucus membranes or skin necrosis: Unknown Has patient had a PCN reaction that required hospitalization: Unknown Has patient had a PCN reaction occurring within the last 10 years: Unknown If all of the above answers are "NO", then may proceed with Cephalosporin use.    Vancomycin Hives   Lyrica [Pregabalin] Hives   Bupropion Other (See Comments)    Causes migraines    MEDICATIONS: Current Outpatient Medications on File Prior to Visit  Medication Sig Dispense Refill   Calcium Carb-Cholecalciferol (CALCIUM 600+D) 600-800 MG-UNIT TABS Take 1 tablet by mouth daily.     Cholecalciferol (VITAMIN D3) 5000 units CAPS Take 1 capsule by mouth daily.     Diclofenac Sodium (PENNSAID) 2 % SOLN Pennsaid 20 mg/gram/actuation (2 %) topical soln in metered-dose pump  apply 2 PUMPS twice daily (Patient taking differently: Apply 2 Pump topically 2 (two) times daily as needed (PAIN). Pennsaid 20 mg/gram/actuation (2 %) topical soln in metered-dose pump  apply 2 PUMPS twice daily) 112 g 0   doxycycline (VIBRAMYCIN) 100 MG capsule Take 100 mg by mouth 2 (two) times daily as needed.     fluconazole (DIFLUCAN) 100 MG tablet Take 100 mg by mouth daily as needed (FINGER).      loratadine (CLARITIN) 10 MG tablet Take 10 mg by mouth daily as needed for allergies.     Multiple Vitamins-Minerals (HAIR SKIN AND NAILS FORMULA PO) Take 1 tablet  by mouth daily.     Multiple Vitamins-Minerals (MULTIVITAMIN WITH MINERALS) tablet Take 1 tablet by mouth daily.     pramipexole (MIRAPEX) 0.25 MG tablet TAKE 1 TABLET BY MOUTH 30 MINUTES TO 1 HOUR PRIOR TO BEDTIME 90 tablet 1   rizatriptan (MAXALT) 10 MG tablet TAKE 1 TABLET BY MOUTH AT MIGRAINE ONSET MAY REPEAT IN 2 HOURS IF HEADACHE PERSISTS.NOT EXCEED 2 TABLETS IN 24HRS (Patient taking differently: Take 10 mg by mouth as needed for migraine. ) 10 tablet 0   valACYclovir (VALTREX) 1000 MG tablet Take 1,000 mg by mouth 2 (two) times daily as needed.     No current facility-administered medications on file prior to visit.     PAST MEDICAL HISTORY: Past Medical History:  Diagnosis Date   Chronic knee pain    arthritis   Fever blister    history of   History of kidney stones    Hypertension    Migraines    Morbid obesity with BMI of 40.0-44.9, adult ([facility])    Narcolepsy without cataplexy(347.00)    Oral herpes    Osteoarthritis    Restless leg    Sleep apnea  per pt not treated; not able to use CPAP due to claustrophobia   Vitamin D deficiency     PAST SURGICAL HISTORY: Past Surgical History:  Procedure Laterality Date   ABDOMINAL HYSTERECTOMY  2010   artroscopic meniscus repair Bilateral    cervix removed, bladder tack     2015   COLONOSCOPY WITH PROPOFOL N/A 06/19/2017   Procedure: COLONOSCOPY WITH PROPOFOL;  Surgeon: [name], MD;  Location: [facility] ENDOSCOPY;  Service: Endoscopy;  Laterality: N/A;   LAPAROSCOPIC SALPINGO OOPHERECTOMY Right 03/22/2017   Procedure: LAPAROSCOPIC SALPINGO OOPHORECTOMY;  Surgeon: Ward, Honor Loh, MD;  Location: [facility] ORS;  Service: Gynecology;  Laterality: Right;   TOTAL KNEE ARTHROPLASTY Right 10/29/2018   Procedure: TOTAL KNEE ARTHROPLASTY;  Surgeon: Carole Civil, MD;  Location: [facility] ORS;  Service: Orthopedics;  Laterality: Right;    SOCIAL HISTORY: Social History   Tobacco Use   Smoking status: Former Smoker      Packs/day: 0.50    Years: 4.00    Pack years: 2.00    Last attempt to quit: 10/23/1986    Years since quitting: 32.3   Smokeless tobacco: Never Used  Substance Use Topics   Alcohol use: No   Drug use: No    FAMILY HISTORY: Family History  Problem Relation Age of Onset   Breast cancer Mother 14   Diabetes Mother    Depression Mother    Anxiety disorder Mother    Obesity Mother    Breast cancer Paternal Grandmother    ROS: Review of Systems  Respiratory: Negative for shortness of breath.   Cardiovascular: Negative for chest pain.  Psychiatric/Behavioral: Positive for depression (emotional eating). Negative for suicidal ideas.       Negative for homicidal ideas.   PHYSICAL EXAM: Pt in no acute distress  RECENT LABS AND TESTS: BMET    Component Value Date/Time   NA 138 10/30/2018 0502   NA 141 08/19/2018 1005   NA 137 05/23/2013 1515   K 3.1 (L) 10/30/2018 0502   K 3.9 05/23/2013 1515   CL 104 10/30/2018 0502   CL 104 05/23/2013 1515   CO2 26 10/30/2018 0502   CO2 35 (H) 05/23/2013 1515   GLUCOSE 124 (H) 10/30/2018 0502   GLUCOSE 68 05/23/2013 1515   BUN 14 10/30/2018 0502   BUN 18 08/19/2018 1005   BUN 10 05/23/2013 1515   CREATININE 0.82 10/30/2018 0502   CREATININE 0.86 05/23/2013 1515   CALCIUM 8.3 (L) 10/30/2018 0502   CALCIUM 8.4 (L) 05/23/2013 1515   GFRNONAA >60 10/30/2018 0502   GFRNONAA >60 05/23/2013 1515   GFRAA >60 10/30/2018 0502   GFRAA >60 05/23/2013 1515   Lab Results  Component Value Date   HGBA1C 5.6 12/17/2018   HGBA1C 5.7 (H) 08/19/2018   HGBA1C 5.9 06/03/2018   HGBA1C 5.8 04/27/2017   HGBA1C 5.7 03/24/2016   Lab Results  Component Value Date   INSULIN 25.6 (H) 12/17/2018   INSULIN 29.0 (H) 08/19/2018   CBC    Component Value Date/Time   WBC 13.4 (H) 10/30/2018 0502   RBC 3.87 10/30/2018 0502   HGB 10.5 (L) 10/30/2018 0502   HGB 13.1 08/19/2018 1005   HCT 33.7 (L) 10/30/2018 0502   HCT 39.9 08/19/2018 1005    PLT 215 10/30/2018 0502   PLT 225 11/17/2016 1000   MCV 87.1 10/30/2018 0502   MCV 85 08/19/2018 1005   MCV 83 05/14/2013 1149   MCH 27.1 10/30/2018 0502   MCHC 31.2  10/30/2018 0502   RDW 13.8 10/30/2018 0502   RDW 14.0 08/19/2018 1005   RDW 14.1 05/14/2013 1149   LYMPHSABS 2.9 10/24/2018 0815   LYMPHSABS 3.1 08/19/2018 1005   MONOABS 0.5 10/24/2018 0815   EOSABS 0.2 10/24/2018 0815   EOSABS 0.2 08/19/2018 1005   BASOSABS 0.0 10/24/2018 0815   BASOSABS 0.1 08/19/2018 1005   Iron/TIBC/Ferritin/ %Sat    Component Value Date/Time   IRON 50 06/24/2014 1542   TIBC 328 06/24/2014 1542   FERRITIN 130 06/24/2014 1542   IRONPCTSAT 15 06/24/2014 1542   Lipid Panel     Component Value Date/Time   CHOL 171 12/17/2018 1034   TRIG 92 12/17/2018 1034   HDL 55 12/17/2018 1034   CHOLHDL 3 06/03/2018 0746   VLDL 28.0 06/03/2018 0746   LDLCALC 98 12/17/2018 1034   Hepatic Function Panel     Component Value Date/Time   PROT 7.3 08/19/2018 1005   ALBUMIN 4.5 08/19/2018 1005   AST 28 08/19/2018 1005   ALT 33 (H) 08/19/2018 1005   ALKPHOS 115 08/19/2018 1005   BILITOT 0.6 08/19/2018 1005      Component Value Date/Time   TSH 2.850 08/19/2018 1005   TSH 1.70 03/24/2016 1259   Results for ANANI, GU (MRN [id number]) as of 02/19/2019 09:03  Ref. Range 12/17/2018 10:34  Vitamin D, 25-Hydroxy Latest Ref Range: 30.0 - 100.0 ng/mL 69.1   I, Michaelene Song, am acting as Location manager for [employer], FNP-C.  I have reviewed the above documentation for accuracy and completeness, and I agree with the above.  - Zoei Amison, FNP-C.

## 2019-03-03 ENCOUNTER — Encounter (INDEPENDENT_AMBULATORY_CARE_PROVIDER_SITE_OTHER): Payer: Self-pay

## 2019-03-04 ENCOUNTER — Other Ambulatory Visit: Payer: Self-pay

## 2019-03-04 ENCOUNTER — Encounter (INDEPENDENT_AMBULATORY_CARE_PROVIDER_SITE_OTHER): Payer: Self-pay | Admitting: Family Medicine

## 2019-03-04 ENCOUNTER — Ambulatory Visit (INDEPENDENT_AMBULATORY_CARE_PROVIDER_SITE_OTHER): Payer: 59 | Admitting: Family Medicine

## 2019-03-04 DIAGNOSIS — Z6841 Body Mass Index (BMI) 40.0 and over, adult: Secondary | ICD-10-CM

## 2019-03-04 DIAGNOSIS — E8881 Metabolic syndrome: Secondary | ICD-10-CM

## 2019-03-04 DIAGNOSIS — I10 Essential (primary) hypertension: Secondary | ICD-10-CM

## 2019-03-04 MED ORDER — LIRAGLUTIDE -WEIGHT MANAGEMENT 18 MG/3ML ~~LOC~~ SOPN: 3.0000 mg | PEN_INJECTOR | Freq: Every day | SUBCUTANEOUS | 0 refills | Status: DC

## 2019-03-04 MED ORDER — HYDROCHLOROTHIAZIDE 25 MG PO TABS: 25.0000 mg | ORAL_TABLET | Freq: Every day | ORAL | 0 refills | Status: DC

## 2019-03-04 MED FILL — HYDROCHLOROTHIAZIDE 25 MG T: 25 | 30 days supply | Qty: 30 | Fill #0

## 2019-03-04 NOTE — Progress Notes (Signed)
Office: [phone]  /  Fax: [phone] TeleHealth Visit:  [name] has verbally consented to this TeleHealth visit today. The patient is located at work, the provider is located at the [employer] [facility] office. The participants in this visit include the listed provider and patient. The visit was conducted today via Webex.  HPI:   Chief Complaint: OBESITY Amy Mooney is here to discuss her progress with her obesity treatment plan. She is on the Category 2 plan and is following her eating plan approximately 50% of the time. She states she is exercising 0 minutes 0 times per week. Amy Mooney states she weighed 240 lbs today. She has gained about 4 lbs. She reports struggling with eating at night. We were unable to weigh the patient today for this TeleHealth visit. She feels as if she has gained 2 lbs weight since her last visit. She has lost 11 lbs since starting treatment with [name].  Hypertension Amy Mooney is a 53 y.o. female with hypertension, well controlled on HCTZ.  Amy Mooney denies chest pain or shortness of breath on exertion. She is working weight loss to help control her blood pressure with the goal of decreasing her risk of heart attack and stroke. Amy Mooney 's blood pressures are running 130's/80's. BP Readings from Last 3 Encounters:  12/17/18 123/85  12/09/18 132/79  11/26/18 131/82     Insulin Resistance Amy Mooney has a diagnosis of insulin resistance based on her elevated fasting insulin level >5. Although Amy Mooney's blood glucose readings are still under good control, insulin resistance puts her at greater risk of metabolic syndrome and diabetes. She is not taking metformin currently and continues to work on diet and exercise to decrease risk of diabetes. Amy Mooney reports polyphagia at night. Lab Results  Component Value Date   HGBA1C 5.6 12/17/2018   ASSESSMENT AND PLAN:  Essential hypertension - Plan: hydrochlorothiazide (HYDRODIURIL) 25 MG tablet  Insulin  resistance  Class 3 severe obesity with serious comorbidity and body mass index (BMI) of 40.0 to 44.9 in adult, unspecified obesity type ([place]) - Plan: Liraglutide -Weight Management (SAXENDA) 18 MG/3ML SOPN  PLAN:  Hypertension We discussed sodium restriction, working on healthy weight loss, and a regular exercise program as the means to achieve improved blood pressure control. [name] agreed with this plan and agreed to follow up as directed. We will continue to monitor her blood pressure as well as her progress with the above lifestyle modifications. [name] was given a refill on her HCTZ 25 mg QD #30 with 0 refills and will watch for signs of hypotension as she continues her lifestyle modifications. She agrees to follow-up with our clinic in 3 weeks.  Insulin Resistance [name] will continue to work on weight loss, exercise, and decreasing simple carbohydrates in her diet to help decrease the risk of diabetes. We dicussed metformin including benefits and risks. She was informed that eating too many simple carbohydrates or too many calories at one sitting increases the likelihood of GI side effects. [name] will start on Saxenda and agrees to follow-up with our clinic in 3 weeks.  Obesity Amy Mooney is currently in the action stage of change. As such, her goal is to continue with weight loss efforts. She has agreed to follow the Category 2 plan. Amy Mooney has been instructed to work up to a goal of 150 minutes of combined cardio and strengthening exercise per week for weight loss and overall health benefits. We discussed the following Behavioral Modification Strategies today: avoiding temptations and  planning for success. Amy Mooney was given a prescription for Saxenda 3 mg SQ daily #5 pens with 0 refills (patient to take 0.6 mg daily SQ). She is unable to take Contrave because bupropion caused migraines.  Amy Mooney has agreed to follow-up with our clinic in 3 weeks. Amy Mooney will make an in person appointment for  3 weeks and will call back to schedule. She was informed of the importance of frequent follow-up visits to maximize her success with intensive lifestyle modifications for her multiple health conditions.  ALLERGIES: Allergies  Allergen Reactions  . Nuvigil [Armodafinil] Hives  . Penicillins Rash    Has patient had a PCN reaction causing immediate rash, facial/tongue/throat swelling, SOB or lightheadedness with hypotension: Unknown Has patient had a PCN reaction causing severe rash involving mucus membranes or skin necrosis: Unknown Has patient had a PCN reaction that required hospitalization: Unknown Has patient had a PCN reaction occurring within the last 10 years: Unknown If all of the above answers are "NO", then may proceed with Cephalosporin use.   . Vancomycin Hives  . Lyrica [Pregabalin] Hives  . Bupropion Other (See Comments)    Causes migraines    MEDICATIONS: Current Outpatient Medications on File Prior to Visit  Medication Sig Dispense Refill  . Calcium Carb-Cholecalciferol (CALCIUM 600+D) 600-800 MG-UNIT TABS Take 1 tablet by mouth daily.    . Cholecalciferol (VITAMIN D3) 5000 units CAPS Take 1 capsule by mouth daily.    . Diclofenac Sodium (PENNSAID) 2 % SOLN Pennsaid 20 mg/gram/actuation (2 %) topical soln in metered-dose pump  apply 2 PUMPS twice daily (Patient taking differently: Apply 2 Pump topically 2 (two) times daily as needed (PAIN). Pennsaid 20 mg/gram/actuation (2 %) topical soln in metered-dose pump  apply 2 PUMPS twice daily) 112 g 0  . doxycycline (VIBRAMYCIN) 100 MG capsule Take 100 mg by mouth 2 (two) times daily as needed.    . fluconazole (DIFLUCAN) 100 MG tablet Take 100 mg by mouth daily as needed (FINGER).     Marland Kitchen loratadine (CLARITIN) 10 MG tablet Take 10 mg by mouth daily as needed for allergies.    . Multiple Vitamins-Minerals (HAIR SKIN AND NAILS FORMULA PO) Take 1 tablet by mouth daily.    . Multiple Vitamins-Minerals (MULTIVITAMIN WITH MINERALS)  tablet Take 1 tablet by mouth daily.    . pramipexole (MIRAPEX) 0.25 MG tablet TAKE 1 TABLET BY MOUTH 30 MINUTES TO 1 HOUR PRIOR TO BEDTIME 90 tablet 1  . rizatriptan (MAXALT) 10 MG tablet TAKE 1 TABLET BY MOUTH AT MIGRAINE ONSET MAY REPEAT IN 2 HOURS IF HEADACHE PERSISTS.NOT EXCEED 2 TABLETS IN 24HRS (Patient taking differently: Take 10 mg by mouth as needed for migraine. ) 10 tablet 0  . valACYclovir (VALTREX) 1000 MG tablet Take 1,000 mg by mouth 2 (two) times daily as needed.     No current facility-administered medications on file prior to visit.     PAST MEDICAL HISTORY: Past Medical History:  Diagnosis Date  . Chronic knee pain    arthritis  . Fever blister    history of  . History of kidney stones   . Hypertension   . Migraines   . Morbid obesity with BMI of 40.0-44.9, adult ([place])   . 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 Oral herpes   . Osteoarthritis   . Restless leg   . Sleep apnea    per pt not treated; not able to use CPAP due to claustrophobia  . Vitamin D deficiency  PAST SURGICAL HISTORY: Past Surgical History:  Procedure Laterality Date  . ABDOMINAL HYSTERECTOMY  2010  . artroscopic meniscus repair Bilateral   . cervix removed, bladder tack     2015  . COLONOSCOPY WITH PROPOFOL N/A 06/19/2017   Procedure: COLONOSCOPY WITH PROPOFOL;  Surgeon: [name], MD;  Location: [facility] ENDOSCOPY;  Service: Endoscopy;  Laterality: N/A;  . LAPAROSCOPIC SALPINGO OOPHERECTOMY Right 03/22/2017   Procedure: LAPAROSCOPIC SALPINGO OOPHORECTOMY;  Surgeon: Ward, Honor Loh, MD;  Location: [facility] ORS;  Service: Gynecology;  Laterality: Right;  . TOTAL KNEE ARTHROPLASTY Right 10/29/2018   Procedure: TOTAL KNEE ARTHROPLASTY;  Surgeon: Carole Civil, MD;  Location: [facility] ORS;  Service: Orthopedics;  Laterality: Right;    SOCIAL HISTORY: Social History   Tobacco Use  . Smoking status: Former Smoker    Packs/day: 0.50    Years: 4.00    Pack years: 2.00    Last attempt to  quit: 10/23/1986    Years since quitting: 32.3  . Smokeless tobacco: Never Used  Substance Use Topics  . Alcohol use: No  . Drug use: No    FAMILY HISTORY: Family History  Problem Relation Age of Onset  . Breast cancer Mother 45  . Diabetes Mother   . Depression Mother   . Anxiety disorder Mother   . Obesity Mother   . Breast cancer Paternal Grandmother    ROS: Review of Systems  Respiratory: Negative for shortness of breath.   Cardiovascular: Negative for chest pain.  Endo/Heme/Allergies:       Positive for polyphagia at night.   PHYSICAL EXAM: Pt in no acute distress  RECENT LABS AND TESTS: BMET    Component Value Date/Time   NA 138 10/30/2018 0502   NA 141 08/19/2018 1005   NA 137 05/23/2013 1515   K 3.1 (L) 10/30/2018 0502   K 3.9 05/23/2013 1515   CL 104 10/30/2018 0502   CL 104 05/23/2013 1515   CO2 26 10/30/2018 0502   CO2 35 (H) 05/23/2013 1515   GLUCOSE 124 (H) 10/30/2018 0502   GLUCOSE 68 05/23/2013 1515   BUN 14 10/30/2018 0502   BUN 18 08/19/2018 1005   BUN 10 05/23/2013 1515   CREATININE 0.82 10/30/2018 0502   CREATININE 0.86 05/23/2013 1515   CALCIUM 8.3 (L) 10/30/2018 0502   CALCIUM 8.4 (L) 05/23/2013 1515   GFRNONAA >60 10/30/2018 0502   GFRNONAA >60 05/23/2013 1515   GFRAA >60 10/30/2018 0502   GFRAA >60 05/23/2013 1515   Lab Results  Component Value Date   HGBA1C 5.6 12/17/2018   HGBA1C 5.7 (H) 08/19/2018   HGBA1C 5.9 06/03/2018   HGBA1C 5.8 04/27/2017   HGBA1C 5.7 03/24/2016   Lab Results  Component Value Date   INSULIN 25.6 (H) 12/17/2018   INSULIN 29.0 (H) 08/19/2018   CBC    Component Value Date/Time   WBC 13.4 (H) 10/30/2018 0502   RBC 3.87 10/30/2018 0502   HGB 10.5 (L) 10/30/2018 0502   HGB 13.1 08/19/2018 1005   HCT 33.7 (L) 10/30/2018 0502   HCT 39.9 08/19/2018 1005   PLT 215 10/30/2018 0502   PLT 225 11/17/2016 1000   MCV 87.1 10/30/2018 0502   MCV 85 08/19/2018 1005   MCV 83 05/14/2013 1149   MCH 27.1  10/30/2018 0502   MCHC 31.2 10/30/2018 0502   RDW 13.8 10/30/2018 0502   RDW 14.0 08/19/2018 1005   RDW 14.1 05/14/2013 1149   LYMPHSABS 2.9 10/24/2018 0815   LYMPHSABS 3.1  08/19/2018 1005   MONOABS 0.5 10/24/2018 0815   EOSABS 0.2 10/24/2018 0815   EOSABS 0.2 08/19/2018 1005   BASOSABS 0.0 10/24/2018 0815   BASOSABS 0.1 08/19/2018 1005   Iron/TIBC/Ferritin/ %Sat    Component Value Date/Time   IRON 50 06/24/2014 1542   TIBC 328 06/24/2014 1542   FERRITIN 130 06/24/2014 1542   IRONPCTSAT 15 06/24/2014 1542   Lipid Panel     Component Value Date/Time   CHOL 171 12/17/2018 1034   TRIG 92 12/17/2018 1034   HDL 55 12/17/2018 1034   CHOLHDL 3 06/03/2018 0746   VLDL 28.0 06/03/2018 0746   LDLCALC 98 12/17/2018 1034   Hepatic Function Panel     Component Value Date/Time   PROT 7.3 08/19/2018 1005   ALBUMIN 4.5 08/19/2018 1005   AST 28 08/19/2018 1005   ALT 33 (H) 08/19/2018 1005   ALKPHOS 115 08/19/2018 1005   BILITOT 0.6 08/19/2018 1005      Component Value Date/Time   TSH 2.850 08/19/2018 1005   TSH 1.70 03/24/2016 1259   Results for LAVONNE, KINDERMAN (MRN [id number]) as of 03/04/2019 13:10  Ref. Range 12/17/2018 10:34  Vitamin D, 25-Hydroxy Latest Ref Range: 30.0 - 100.0 ng/mL 69.1    I, Michaelene Song, am acting as Location manager for [employer], FNP-C.  I have reviewed the above documentation for accuracy and completeness, and I agree with the above.  - Boen Sterbenz, FNP-C.

## 2019-03-07 ENCOUNTER — Encounter (INDEPENDENT_AMBULATORY_CARE_PROVIDER_SITE_OTHER): Payer: Self-pay | Admitting: Family Medicine

## 2019-03-07 ENCOUNTER — Other Ambulatory Visit: Payer: Self-pay | Admitting: Orthopedic Surgery

## 2019-03-07 MED FILL — SAXENDA 18 MG/3 ML PEN: 18 | 30 days supply | Qty: 15 | Fill #0

## 2019-03-07 MED FILL — PRAMIPEXOLE 0.25 MG TABLET: 0.25 | 90 days supply | Qty: 90 | Fill #0

## 2019-03-10 ENCOUNTER — Other Ambulatory Visit (INDEPENDENT_AMBULATORY_CARE_PROVIDER_SITE_OTHER): Payer: Self-pay

## 2019-03-10 DIAGNOSIS — Z6841 Body Mass Index (BMI) 40.0 and over, adult: Secondary | ICD-10-CM

## 2019-03-11 ENCOUNTER — Other Ambulatory Visit (INDEPENDENT_AMBULATORY_CARE_PROVIDER_SITE_OTHER): Payer: Self-pay

## 2019-03-11 DIAGNOSIS — Z6841 Body Mass Index (BMI) 40.0 and over, adult: Secondary | ICD-10-CM

## 2019-03-11 DIAGNOSIS — E66813 Obesity, class 3: Secondary | ICD-10-CM

## 2019-03-11 MED ORDER — INSULIN PEN NEEDLE 32G X 4 MM MISC: 1.0000 | Freq: Two times a day (BID) | 0 refills | Status: DC

## 2019-03-11 MED FILL — UNIFINE PENTIPS 32GX5/32": 32G X 4 MM | 50 days supply | Qty: 100 | Fill #0

## 2019-03-11 MED FILL — UNIFINE PENTIPS 32GX5/32: 32G X 4 MM | 50 days supply | Qty: 100 | Fill #0

## 2019-03-27 ENCOUNTER — Ambulatory Visit (INDEPENDENT_AMBULATORY_CARE_PROVIDER_SITE_OTHER): Payer: 59 | Admitting: Family Medicine

## 2019-03-31 ENCOUNTER — Encounter (INDEPENDENT_AMBULATORY_CARE_PROVIDER_SITE_OTHER): Payer: Self-pay

## 2019-03-31 ENCOUNTER — Ambulatory Visit (INDEPENDENT_AMBULATORY_CARE_PROVIDER_SITE_OTHER): Payer: 59 | Admitting: Family Medicine

## 2019-03-31 ENCOUNTER — Other Ambulatory Visit: Payer: Self-pay

## 2019-03-31 DIAGNOSIS — Z6841 Body Mass Index (BMI) 40.0 and over, adult: Secondary | ICD-10-CM

## 2019-03-31 DIAGNOSIS — E559 Vitamin D deficiency, unspecified: Secondary | ICD-10-CM | POA: Diagnosis not present

## 2019-03-31 MED FILL — DICLOFENAC SODIUM 75 MG TAB: 75 | 30 days supply | Qty: 60 | Fill #1

## 2019-04-02 NOTE — Progress Notes (Signed)
Office: [phone]  /  Fax: [phone] TeleHealth Visit:  Amy Mooney has verbally consented to this TeleHealth visit today. The patient is located at work, the provider is located at the [employer] [facility] office. The participants in this visit include the listed provider and patient and any and all parties involved. The visit was conducted today via WebEx.  HPI:   Chief Complaint: OBESITY Amy Mooney is here to discuss her progress with her obesity treatment plan. She is on the Category 2 plan and is following her eating plan approximately 60 % of the time. She states she is exercising 0 minutes 0 times per week. Amy Mooney's weight is stable at 240 pounds. Amy Mooney was prescribed at the last visit (03/04/19). She is taking 0.6 mg daily and she doesn't feel it helps much. She is not skipping meals. Amy Mooney tends to eat ice cream at night. She denies nausea. We were unable to weigh the patient today for this TeleHealth visit. She feels as if she has maintained weight since her last visit. She has lost 4 lbs since starting treatment with [name].  Vitamin D deficiency Amy Mooney has a diagnosis of vitamin D deficiency. She is on OTC vit D and denies nausea, vomiting or muscle weakness.  ASSESSMENT AND PLAN:  Vitamin D deficiency  Class 3 severe obesity with serious comorbidity and body mass index (BMI) of 40.0 to 44.9 in adult, unspecified obesity type ([facility])  PLAN:  Vitamin D Deficiency Amy Mooney was informed that low vitamin D levels contributes to fatigue and are associated with obesity, breast, and colon cancer. She will continue to take OTC Vitamin D and will follow up for routine testing of vitamin D, at least 2-3 times per year. She was informed of the risk of over-replacement of vitamin D and agrees to not increase her dose unless she discusses this with [name] first. We will check vitamin D level at the next visit.  I spent > than 50% of the 15 minute visit on counseling as documented in the  note.  Obesity Amy Mooney is currently in the action stage of change. As such, her goal is to continue with weight loss efforts She has agreed to follow the Category 2 plan Amy Mooney has been instructed to work up to a goal of 150 minutes of combined cardio and strengthening exercise per week for weight loss and overall health benefits. We discussed the following Behavioral Modification Strategies today: planning for success, better snacking choices and decreasing simple carbohydrates   Amy Mooney agreed to increase the dose of Saxenda to 1.2 mg daily.  Amy Mooney has agreed to follow up with our clinic in 3 to 4 weeks. She was informed of the importance of frequent follow up visits to maximize her success with intensive lifestyle modifications for her multiple health conditions.  ALLERGIES: Allergies  Allergen Reactions  . Nuvigil [Armodafinil] Hives  . Penicillins Rash    Has patient had a PCN reaction causing immediate rash, facial/tongue/throat swelling, SOB or lightheadedness with hypotension: Unknown Has patient had a PCN reaction causing severe rash involving mucus membranes or skin necrosis: Unknown Has patient had a PCN reaction that required hospitalization: Unknown Has patient had a PCN reaction occurring within the last 10 years: Unknown If all of the above answers are "NO", then may proceed with Cephalosporin use.   . Vancomycin Hives  . Lyrica [Pregabalin] Hives  . Bupropion Other (See Comments)    Causes migraines    MEDICATIONS: Current Outpatient Medications on File Prior to  Visit  Medication Sig Dispense Refill  . Calcium Carb-Cholecalciferol (CALCIUM 600+D) 600-800 MG-UNIT TABS Take 1 tablet by mouth daily.    . Cholecalciferol (VITAMIN D3) 5000 units CAPS Take 1 capsule by mouth daily.    . Diclofenac Sodium (PENNSAID) 2 % SOLN Pennsaid 20 mg/gram/actuation (2 %) topical soln in metered-dose pump  apply 2 PUMPS twice daily (Patient taking differently: Apply 2 Pump topically 2  (two) times daily as needed (PAIN). Pennsaid 20 mg/gram/actuation (2 %) topical soln in metered-dose pump  apply 2 PUMPS twice daily) 112 g 0  . doxycycline (VIBRAMYCIN) 100 MG capsule Take 100 mg by mouth 2 (two) times daily as needed.    . fluconazole (DIFLUCAN) 100 MG tablet Take 100 mg by mouth daily as needed (FINGER).     . hydrochlorothiazide (HYDRODIURIL) 25 MG tablet Take 1 tablet (25 mg total) by mouth daily. 30 tablet 0  . Insulin Pen Needle (BD PEN NEEDLE NANO 2ND GEN) 32G X 4 MM MISC 1 Package by Does not apply route 2 (two) times daily. 100 each 0  . Liraglutide -Weight Management (SAXENDA) 18 MG/3ML SOPN Inject 3 mg into the skin daily. 5 pen 0  . loratadine (CLARITIN) 10 MG tablet Take 10 mg by mouth daily as needed for allergies.    . Multiple Vitamins-Minerals (HAIR SKIN AND NAILS FORMULA PO) Take 1 tablet by mouth daily.    . Multiple Vitamins-Minerals (MULTIVITAMIN WITH MINERALS) tablet Take 1 tablet by mouth daily.    . pramipexole (MIRAPEX) 0.25 MG tablet TAKE 1 TABLET BY MOUTH 30 MINUTES TO 1 HOUR PRIOR TO BEDTIME 90 tablet 1  . rizatriptan (MAXALT) 10 MG tablet TAKE 1 TABLET BY MOUTH AT MIGRAINE ONSET MAY REPEAT IN 2 HOURS IF HEADACHE PERSISTS.NOT EXCEED 2 TABLETS IN 24HRS (Patient taking differently: Take 10 mg by mouth as needed for migraine. ) 10 tablet 0  . valACYclovir (VALTREX) 1000 MG tablet Take 1,000 mg by mouth 2 (two) times daily as needed.     No current facility-administered medications on file prior to visit.     PAST MEDICAL HISTORY: Past Medical History:  Diagnosis Date  . Chronic knee pain    arthritis  . Fever blister    history of  . History of kidney stones   . Hypertension   . Migraines   . Morbid obesity with BMI of 40.0-44.9, adult ([place])   . 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 Oral herpes   . Osteoarthritis   . Restless leg   . Sleep apnea    per pt not treated; not able to use CPAP due to claustrophobia  . Vitamin D deficiency      PAST SURGICAL HISTORY: Past Surgical History:  Procedure Laterality Date  . ABDOMINAL HYSTERECTOMY  2010  . artroscopic meniscus repair Bilateral   . cervix removed, bladder tack     2015  . COLONOSCOPY WITH PROPOFOL N/A 06/19/2017   Procedure: COLONOSCOPY WITH PROPOFOL;  Surgeon: [name], MD;  Location: [facility] ENDOSCOPY;  Service: Endoscopy;  Laterality: N/A;  . LAPAROSCOPIC SALPINGO OOPHERECTOMY Right 03/22/2017   Procedure: LAPAROSCOPIC SALPINGO OOPHORECTOMY;  Surgeon: Ward, Honor Loh, MD;  Location: [facility] ORS;  Service: Gynecology;  Laterality: Right;  . TOTAL KNEE ARTHROPLASTY Right 10/29/2018   Procedure: TOTAL KNEE ARTHROPLASTY;  Surgeon: Carole Civil, MD;  Location: [facility] ORS;  Service: Orthopedics;  Laterality: Right;    SOCIAL HISTORY: Social History   Tobacco Use  . Smoking status: Former Smoker  Packs/day: 0.50    Years: 4.00    Pack years: 2.00    Last attempt to quit: 10/23/1986    Years since quitting: 32.4  . Smokeless tobacco: Never Used  Substance Use Topics  . Alcohol use: No  . Drug use: No    FAMILY HISTORY: Family History  Problem Relation Age of Onset  . Breast cancer Mother 67  . Diabetes Mother   . Depression Mother   . Anxiety disorder Mother   . Obesity Mother   . Breast cancer Paternal Grandmother     ROS: Review of Systems  Constitutional: Negative for weight loss.  Gastrointestinal: Negative for nausea and vomiting.  Musculoskeletal:       Negative for muscle weakness    PHYSICAL EXAM: Pt in no acute distress  RECENT LABS AND TESTS: BMET    Component Value Date/Time   NA 138 10/30/2018 0502   NA 141 08/19/2018 1005   NA 137 05/23/2013 1515   K 3.1 (L) 10/30/2018 0502   K 3.9 05/23/2013 1515   CL 104 10/30/2018 0502   CL 104 05/23/2013 1515   CO2 26 10/30/2018 0502   CO2 35 (H) 05/23/2013 1515   GLUCOSE 124 (H) 10/30/2018 0502   GLUCOSE 68 05/23/2013 1515   BUN 14 10/30/2018 0502   BUN 18 08/19/2018 1005   BUN 10  05/23/2013 1515   CREATININE 0.82 10/30/2018 0502   CREATININE 0.86 05/23/2013 1515   CALCIUM 8.3 (L) 10/30/2018 0502   CALCIUM 8.4 (L) 05/23/2013 1515   GFRNONAA >60 10/30/2018 0502   GFRNONAA >60 05/23/2013 1515   GFRAA >60 10/30/2018 0502   GFRAA >60 05/23/2013 1515   Lab Results  Component Value Date   HGBA1C 5.6 12/17/2018   HGBA1C 5.7 (H) 08/19/2018   HGBA1C 5.9 06/03/2018   HGBA1C 5.8 04/27/2017   HGBA1C 5.7 03/24/2016   Lab Results  Component Value Date   INSULIN 25.6 (H) 12/17/2018   INSULIN 29.0 (H) 08/19/2018   CBC    Component Value Date/Time   WBC 13.4 (H) 10/30/2018 0502   RBC 3.87 10/30/2018 0502   HGB 10.5 (L) 10/30/2018 0502   HGB 13.1 08/19/2018 1005   HCT 33.7 (L) 10/30/2018 0502   HCT 39.9 08/19/2018 1005   PLT 215 10/30/2018 0502   PLT 225 11/17/2016 1000   MCV 87.1 10/30/2018 0502   MCV 85 08/19/2018 1005   MCV 83 05/14/2013 1149   MCH 27.1 10/30/2018 0502   MCHC 31.2 10/30/2018 0502   RDW 13.8 10/30/2018 0502   RDW 14.0 08/19/2018 1005   RDW 14.1 05/14/2013 1149   LYMPHSABS 2.9 10/24/2018 0815   LYMPHSABS 3.1 08/19/2018 1005   MONOABS 0.5 10/24/2018 0815   EOSABS 0.2 10/24/2018 0815   EOSABS 0.2 08/19/2018 1005   BASOSABS 0.0 10/24/2018 0815   BASOSABS 0.1 08/19/2018 1005   Iron/TIBC/Ferritin/ %Sat    Component Value Date/Time   IRON 50 06/24/2014 1542   TIBC 328 06/24/2014 1542   FERRITIN 130 06/24/2014 1542   IRONPCTSAT 15 06/24/2014 1542   Lipid Panel     Component Value Date/Time   CHOL 171 12/17/2018 1034   TRIG 92 12/17/2018 1034   HDL 55 12/17/2018 1034   CHOLHDL 3 06/03/2018 0746   VLDL 28.0 06/03/2018 0746   LDLCALC 98 12/17/2018 1034   Hepatic Function Panel     Component Value Date/Time   PROT 7.3 08/19/2018 1005   ALBUMIN 4.5 08/19/2018 1005   AST 28  08/19/2018 1005   ALT 33 (H) 08/19/2018 1005   ALKPHOS 115 08/19/2018 1005   BILITOT 0.6 08/19/2018 1005      Component Value Date/Time   TSH 2.850  08/19/2018 1005   TSH 1.70 03/24/2016 1259      I, Doreene Nest, am acting as Location manager for [employer], FNP-C.  I have reviewed the above documentation for accuracy and completeness, and I agree with the above.  - Evertte Sones, FNP-C.

## 2019-04-03 ENCOUNTER — Encounter (INDEPENDENT_AMBULATORY_CARE_PROVIDER_SITE_OTHER): Payer: Self-pay | Admitting: Family Medicine

## 2019-04-21 ENCOUNTER — Encounter (INDEPENDENT_AMBULATORY_CARE_PROVIDER_SITE_OTHER): Payer: Self-pay | Admitting: Family Medicine

## 2019-04-21 ENCOUNTER — Ambulatory Visit (INDEPENDENT_AMBULATORY_CARE_PROVIDER_SITE_OTHER): Payer: 59 | Admitting: Family Medicine

## 2019-04-21 ENCOUNTER — Other Ambulatory Visit: Payer: Self-pay

## 2019-04-21 VITALS — BP 143/82 | HR 68 | Temp 98.2°F | Ht 63.0 in | Wt 237.0 lb

## 2019-04-21 DIAGNOSIS — E559 Vitamin D deficiency, unspecified: Secondary | ICD-10-CM | POA: Diagnosis not present

## 2019-04-21 DIAGNOSIS — D649 Anemia, unspecified: Secondary | ICD-10-CM | POA: Diagnosis not present

## 2019-04-21 DIAGNOSIS — E8881 Metabolic syndrome: Secondary | ICD-10-CM

## 2019-04-21 DIAGNOSIS — Z9189 Other specified personal risk factors, not elsewhere classified: Secondary | ICD-10-CM | POA: Diagnosis not present

## 2019-04-21 DIAGNOSIS — Z6841 Body Mass Index (BMI) 40.0 and over, adult: Secondary | ICD-10-CM

## 2019-04-22 LAB — CBC WITH DIFFERENTIAL
Basophils Absolute: 0.1 10*3/uL (ref 0.0–0.2)
Basos: 1 %
EOS (ABSOLUTE): 0.2 10*3/uL (ref 0.0–0.4)
Eos: 2 %
Hematocrit: 40.1 % (ref 34.0–46.6)
Hemoglobin: 13 g/dL (ref 11.1–15.9)
Immature Grans (Abs): 0 10*3/uL (ref 0.0–0.1)
Immature Granulocytes: 0 %
Lymphocytes Absolute: 2.4 10*3/uL (ref 0.7–3.1)
Lymphs: 35 %
MCH: 26.2 pg — ABNORMAL LOW (ref 26.6–33.0)
MCHC: 32.4 g/dL (ref 31.5–35.7)
MCV: 81 fL (ref 79–97)
Monocytes Absolute: 0.4 10*3/uL (ref 0.1–0.9)
Monocytes: 6 %
Neutrophils Absolute: 4 10*3/uL (ref 1.4–7.0)
Neutrophils: 56 %
RBC: 4.96 x10E6/uL (ref 3.77–5.28)
RDW: 15.6 % — ABNORMAL HIGH (ref 11.7–15.4)
WBC: 7 10*3/uL (ref 3.4–10.8)

## 2019-04-22 LAB — COMPREHENSIVE METABOLIC PANEL
ALT: 36 IU/L — ABNORMAL HIGH (ref 0–32)
AST: 29 IU/L (ref 0–40)
Albumin/Globulin Ratio: 1.6 (ref 1.2–2.2)
Albumin: 4.5 g/dL (ref 3.8–4.9)
Alkaline Phosphatase: 104 IU/L (ref 39–117)
BUN/Creatinine Ratio: 27 — ABNORMAL HIGH (ref 9–23)
BUN: 21 mg/dL (ref 6–24)
Bilirubin Total: 0.5 mg/dL (ref 0.0–1.2)
CO2: 26 mmol/L (ref 20–29)
Calcium: 9.8 mg/dL (ref 8.7–10.2)
Chloride: 102 mmol/L (ref 96–106)
Creatinine, Ser: 0.78 mg/dL (ref 0.57–1.00)
GFR calc Af Amer: 101 mL/min/{1.73_m2} (ref >59)
GFR calc non Af Amer: 88 mL/min/{1.73_m2} (ref >59)
Globulin, Total: 2.9 g/dL (ref 1.5–4.5)
Glucose: 85 mg/dL (ref 65–99)
Potassium: 4.1 mmol/L (ref 3.5–5.2)
Sodium: 142 mmol/L (ref 134–144)
Total Protein: 7.4 g/dL (ref 6.0–8.5)

## 2019-04-22 LAB — HEMOGLOBIN A1C
Est. average glucose Bld gHb Est-mCnc: 114 mg/dL
Hgb A1c MFr Bld: 5.6 % (ref 4.8–5.6)

## 2019-04-22 LAB — INSULIN, RANDOM: INSULIN: 22.8 u[IU]/mL (ref 2.6–24.9)

## 2019-04-22 LAB — VITAMIN D 25 HYDROXY (VIT D DEFICIENCY, FRACTURES): Vit D, 25-Hydroxy: 74.7 ng/mL (ref 30.0–100.0)

## 2019-04-30 DIAGNOSIS — D649 Anemia, unspecified: Secondary | ICD-10-CM

## 2019-04-30 HISTORY — DX: Anemia, unspecified: D64.9

## 2019-04-30 NOTE — Progress Notes (Signed)
Office: [phone]  /  Fax: [phone]   HPI:   Chief Complaint: OBESITY Amy Mooney is here to discuss her progress with her obesity treatment plan. She is on the Category 2 plan and is following her eating plan approximately 60% of the time. She states she is exercising 0 minutes 0 times per week. Amy Mooney feels that COVID has affected her motivation to lose weight. She has decreased ice cream. She does admit to eating takeout for lunch at times and she does tend to get off plan at dinner also. She is currently on Saxenda 1.2 mg daily. She does report polyphagia. Her weight is 237 lb (107.5 kg) today and has had a weight gain of 4 lbs since her last visit. She has lost 7 lbs since starting treatment with [name].  Postoperative Anemia Amy Mooney had a hemoglobin of 10.5 on 10/30/2018 after her right total knee replacement and will have left total knee replacement on 05/27/2019.  Vitamin D deficiency Amy Mooney has a diagnosis of Vitamin D deficiency, which is at goal. She is currently taking OTC Vit D 5,000 daily and denies nausea, vomiting or muscle weakness.  At risk for osteopenia and osteoporosis Amy Mooney is at higher risk of osteopenia and osteoporosis due to Vitamin D deficiency.   Insulin Resistance Amy Mooney has a diagnosis of insulin resistance based on her elevated fasting insulin level >5. Although Amy Mooney's blood glucose readings are still under good control, insulin resistance puts her at greater risk of metabolic syndrome and diabetes. She is on Saxenda for polyphagia and reports polyphagia at times. She continues to work on diet and exercise to decrease risk of diabetes.  ASSESSMENT AND PLAN:  Anemia, unspecified type - Plan: CBC With Differential  Vitamin D deficiency - Plan: VITAMIN D 25 Hydroxy (Vit-D Deficiency, Fractures)  Insulin resistance - Plan: Comprehensive metabolic panel, CBC With Differential, Hemoglobin A1c, Insulin, random  At risk for osteoporosis  Class 3 severe  obesity with serious comorbidity and body mass index (BMI) of 40.0 to 44.9 in adult, unspecified obesity type ([place])  PLAN:  Postoperative Anemia Amy Mooney will have CBC checked today.  Vitamin D Deficiency Amy Mooney was informed that low Vitamin D levels contributes to fatigue and are associated with obesity, breast, and colon cancer. She agrees to continue taking OTC Vit D @ 5,000 IU daily and will have routine testing of Vitamin D today. She was informed of the risk of over-replacement of Vitamin D and agrees to not increase her dose unless she discusses this with [name] first. [facility] agrees to follow-up with our clinic in 2 weeks.  At risk for osteopenia and osteoporosis Amy Mooney was given extended  (15 minutes) osteoporosis prevention counseling today. Amy Mooney is at risk for osteopenia and osteoporsis due to her Vitamin D deficiency. She was encouraged to take her Vitamin D and follow her higher calcium diet and increase strengthening exercise to help strengthen her bones and decrease her risk of osteopenia and osteoporosis.  Insulin Resistance Amy Mooney will continue to work on weight loss, exercise, and decreasing simple carbohydrates in her diet to help decrease the risk of diabetes. Amy Mooney will have fasting insulin, glucose, and A1c today. She will follow-up with [name] as directed to monitor her progress.  Obesity Amy Mooney is currently in the action stage of change. As such, her goal is to continue with weight loss efforts. She has agreed to follow the Category 2 plan and journal 300-400 calories and 30+ grams of protein at lunch daily. Amy Mooney has been instructed to work  up to a goal of 150 minutes of combined cardio and strengthening exercise per week for weight loss and overall health benefits. We discussed the following Behavioral Modification Strategies today: increasing lean protein intake, decreasing simple carbohydrates, decrease eating out, and planning for success. [name] will increase Saxenda to  1.8 mg daily and will follow-up with our clinic in 2 weeks.  Amy Mooney has agreed to follow-up with our clinic in 2 weeks. She was informed of the importance of frequent follow-up visits to maximize her success with intensive lifestyle modifications for her multiple health conditions.  ALLERGIES: Allergies  Allergen Reactions  . Nuvigil [Armodafinil] Hives  . Penicillins Rash    Has patient had a PCN reaction causing immediate rash, facial/tongue/throat swelling, SOB or lightheadedness with hypotension: Unknown Has patient had a PCN reaction causing severe rash involving mucus membranes or skin necrosis: Unknown Has patient had a PCN reaction that required hospitalization: Unknown Has patient had a PCN reaction occurring within the last 10 years: Unknown If all of the above answers are "NO", then may proceed with Cephalosporin use.   . Vancomycin Hives  . Lyrica [Pregabalin] Hives  . Bupropion Other (See Comments)    Causes migraines    MEDICATIONS: Current Outpatient Medications on File Prior to Visit  Medication Sig Dispense Refill  . Calcium Carb-Cholecalciferol (CALCIUM 600+D) 600-800 MG-UNIT TABS Take 1 tablet by mouth daily.    . Cholecalciferol (VITAMIN D3) 5000 units CAPS Take 1 capsule by mouth daily.    . Diclofenac Sodium (PENNSAID) 2 % SOLN Pennsaid 20 mg/gram/actuation (2 %) topical soln in metered-dose pump  apply 2 PUMPS twice daily (Patient taking differently: Apply 2 Pump topically 2 (two) times daily as needed (PAIN). Pennsaid 20 mg/gram/actuation (2 %) topical soln in metered-dose pump  apply 2 PUMPS twice daily) 112 g 0  . doxycycline (VIBRAMYCIN) 100 MG capsule Take 100 mg by mouth 2 (two) times daily as needed.    . fluconazole (DIFLUCAN) 100 MG tablet Take 100 mg by mouth daily as needed (FINGER).     . hydrochlorothiazide (HYDRODIURIL) 25 MG tablet Take 1 tablet (25 mg total) by mouth daily. 30 tablet 0  . Insulin Pen Needle (BD PEN NEEDLE NANO 2ND GEN) 32G X 4 MM  MISC 1 Package by Does not apply route 2 (two) times daily. 100 each 0  . Liraglutide -Weight Management (SAXENDA) 18 MG/3ML SOPN Inject 3 mg into the skin daily. 5 pen 0  . loratadine (CLARITIN) 10 MG tablet Take 10 mg by mouth daily as needed for allergies.    . Multiple Vitamins-Minerals (HAIR SKIN AND NAILS FORMULA PO) Take 1 tablet by mouth daily.    . Multiple Vitamins-Minerals (MULTIVITAMIN WITH MINERALS) tablet Take 1 tablet by mouth daily.    . pramipexole (MIRAPEX) 0.25 MG tablet TAKE 1 TABLET BY MOUTH 30 MINUTES TO 1 HOUR PRIOR TO BEDTIME 90 tablet 1  . rizatriptan (MAXALT) 10 MG tablet TAKE 1 TABLET BY MOUTH AT MIGRAINE ONSET MAY REPEAT IN 2 HOURS IF HEADACHE PERSISTS.NOT EXCEED 2 TABLETS IN 24HRS (Patient taking differently: Take 10 mg by mouth as needed for migraine. ) 10 tablet 0  . valACYclovir (VALTREX) 1000 MG tablet Take 1,000 mg by mouth 2 (two) times daily as needed.     No current facility-administered medications on file prior to visit.     PAST MEDICAL HISTORY: Past Medical History:  Diagnosis Date  . Chronic knee pain    arthritis  . Fever blister  history of  . History of kidney stones   . Hypertension   . Migraines   . Morbid obesity with BMI of 40.0-44.9, adult ([place])   . 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 Oral herpes   . Osteoarthritis   . Restless leg   . Sleep apnea    per pt not treated; not able to use CPAP due to claustrophobia  . Vitamin D deficiency     PAST SURGICAL HISTORY: Past Surgical History:  Procedure Laterality Date  . ABDOMINAL HYSTERECTOMY  2010  . artroscopic meniscus repair Bilateral   . cervix removed, bladder tack     2015  . COLONOSCOPY WITH PROPOFOL N/A 06/19/2017   Procedure: COLONOSCOPY WITH PROPOFOL;  Surgeon: [name], MD;  Location: [facility] ENDOSCOPY;  Service: Endoscopy;  Laterality: N/A;  . LAPAROSCOPIC SALPINGO OOPHERECTOMY Right 03/22/2017   Procedure: LAPAROSCOPIC SALPINGO OOPHORECTOMY;  Surgeon: Ward,  Honor Loh, MD;  Location: [facility] ORS;  Service: Gynecology;  Laterality: Right;  . TOTAL KNEE ARTHROPLASTY Right 10/29/2018   Procedure: TOTAL KNEE ARTHROPLASTY;  Surgeon: Carole Civil, MD;  Location: [facility] ORS;  Service: Orthopedics;  Laterality: Right;    SOCIAL HISTORY: Social History   Tobacco Use  . Smoking status: Former Smoker    Packs/day: 0.50    Years: 4.00    Pack years: 2.00    Quit date: 10/23/1986    Years since quitting: 32.5  . Smokeless tobacco: Never Used  Substance Use Topics  . Alcohol use: No  . Drug use: No    FAMILY HISTORY: Family History  Problem Relation Age of Onset  . Breast cancer Mother 25  . Diabetes Mother   . Depression Mother   . Anxiety disorder Mother   . Obesity Mother   . Breast cancer Paternal Grandmother    ROS: Review of Systems  Gastrointestinal: Negative for nausea and vomiting.  Musculoskeletal:       Negative for muscle weakness.  Endo/Heme/Allergies:       Positive for polyphagia.   PHYSICAL EXAM: Blood pressure (!) 143/82, pulse 68, temperature 98.2 F (36.8 C), height 5\' 3"  (1.6 m), weight 237 lb (107.5 kg), SpO2 96 %. Body mass index is 41.98 kg/m. Physical Exam Vitals signs reviewed.  Constitutional:      Appearance: Normal appearance. She is obese.  Cardiovascular:     Rate and Rhythm: Normal rate.     Pulses: Normal pulses.  Pulmonary:     Effort: Pulmonary effort is normal.     Breath sounds: Normal breath sounds.  Musculoskeletal: Normal range of motion.  Skin:    General: Skin is warm and dry.  Neurological:     Mental Status: She is alert and oriented to person, place, and time.  Psychiatric:        Behavior: Behavior normal.   RECENT LABS AND TESTS: BMET    Component Value Date/Time   NA 142 04/21/2019 1143   NA 137 05/23/2013 1515   K 4.1 04/21/2019 1143   K 3.9 05/23/2013 1515   CL 102 04/21/2019 1143   CL 104 05/23/2013 1515   CO2 26 04/21/2019 1143   CO2 35 (H) 05/23/2013 1515    GLUCOSE 85 04/21/2019 1143   GLUCOSE 124 (H) 10/30/2018 0502   GLUCOSE 68 05/23/2013 1515   BUN 21 04/21/2019 1143   BUN 10 05/23/2013 1515   CREATININE 0.78 04/21/2019 1143   CREATININE 0.86 05/23/2013 1515   CALCIUM 9.8 04/21/2019 1143   CALCIUM 8.4 (L)  05/23/2013 1515   GFRNONAA 88 04/21/2019 1143   GFRNONAA >60 05/23/2013 1515   GFRAA 101 04/21/2019 1143   GFRAA >60 05/23/2013 1515   Lab Results  Component Value Date   HGBA1C 5.6 04/21/2019   HGBA1C 5.6 12/17/2018   HGBA1C 5.7 (H) 08/19/2018   HGBA1C 5.9 06/03/2018   HGBA1C 5.8 04/27/2017   Lab Results  Component Value Date   INSULIN 22.8 04/21/2019   INSULIN 25.6 (H) 12/17/2018   INSULIN 29.0 (H) 08/19/2018   CBC    Component Value Date/Time   WBC 7.0 04/21/2019 1143   WBC 13.4 (H) 10/30/2018 0502   RBC 4.96 04/21/2019 1143   RBC 3.87 10/30/2018 0502   HGB 13.0 04/21/2019 1143   HCT 40.1 04/21/2019 1143   PLT 215 10/30/2018 0502   PLT 225 11/17/2016 1000   MCV 81 04/21/2019 1143   MCV 83 05/14/2013 1149   MCH 26.2 (L) 04/21/2019 1143   MCH 27.1 10/30/2018 0502   MCHC 32.4 04/21/2019 1143   MCHC 31.2 10/30/2018 0502   RDW 15.6 (H) 04/21/2019 1143   RDW 14.1 05/14/2013 1149   LYMPHSABS 2.4 04/21/2019 1143   MONOABS 0.5 10/24/2018 0815   EOSABS 0.2 04/21/2019 1143   BASOSABS 0.1 04/21/2019 1143   Iron/TIBC/Ferritin/ %Sat    Component Value Date/Time   IRON 50 06/24/2014 1542   TIBC 328 06/24/2014 1542   FERRITIN 130 06/24/2014 1542   IRONPCTSAT 15 06/24/2014 1542   Lipid Panel     Component Value Date/Time   CHOL 171 12/17/2018 1034   TRIG 92 12/17/2018 1034   HDL 55 12/17/2018 1034   CHOLHDL 3 06/03/2018 0746   VLDL 28.0 06/03/2018 0746   LDLCALC 98 12/17/2018 1034   Hepatic Function Panel     Component Value Date/Time   PROT 7.4 04/21/2019 1143   ALBUMIN 4.5 04/21/2019 1143   AST 29 04/21/2019 1143   ALT 36 (H) 04/21/2019 1143   ALKPHOS 104 04/21/2019 1143   BILITOT 0.5 04/21/2019 1143       Component Value Date/Time   TSH 2.850 08/19/2018 1005   TSH 1.70 03/24/2016 1259   Results for ESTALENE, BERGEY (MRN [id number]) as of 04/30/2019 08:17  Ref. Range 12/17/2018 10:34  Vitamin D, 25-Hydroxy Latest Ref Range: 30.0 - 100.0 ng/mL 69.1   OBESITY BEHAVIORAL INTERVENTION VISIT  Today's visit was #12   Starting weight: 244 lbs Starting date: 08/19/2018 Today's weight: 237 lbs  Today's date: 04/21/2019 Total lbs lost to date: 7    04/21/2019  Height 5\' 3"  (1.6 m)  Weight 237 lb (107.5 kg)  BMI (Calculated) 41.99  BLOOD PRESSURE - SYSTOLIC 301  BLOOD PRESSURE - DIASTOLIC 82   Body Fat % 60.1 %  Total Body Water (lbs) 87.61 lbs   ASK: We discussed the diagnosis of obesity with Amy Mooney today and Amy Mooney agreed to give [name] permission to discuss obesity behavioral modification therapy today.  ASSESS: Desmond has the diagnosis of obesity and her BMI today is 42.0. Amy Mooney is in the action stage of change.   ADVISE: Amy Mooney was educated on the multiple health risks of obesity as well as the benefit of weight loss to improve her health. She was advised of the need for long term treatment and the importance of lifestyle modifications to improve her current health and to decrease her risk of future health problems.  AGREE: Multiple dietary modification options and treatment options were discussed and  Amy Mooney agreed to follow  the recommendations documented in the above note.  ARRANGE: Amy Mooney was educated on the importance of frequent visits to treat obesity as outlined per CMS and USPSTF guidelines and agreed to schedule her next follow up appointment today.  Amy Mooney, am acting as Location manager for [employer], FNP-C.  I have reviewed the above documentation for accuracy and completeness, and I agree with the above.  - Abrar Bilton, FNP-C.

## 2019-05-02 ENCOUNTER — Ambulatory Visit: Payer: Self-pay | Admitting: Orthopedic Surgery

## 2019-05-02 MED FILL — DICLOFENAC SODIUM 75 MG TAB: 75 | 30 days supply | Qty: 60 | Fill #2

## 2019-05-02 MED FILL — HYDROCHLOROTHIAZIDE 25 MG T: 25 | 30 days supply | Qty: 30 | Fill #0

## 2019-05-05 ENCOUNTER — Encounter: Payer: Self-pay | Admitting: Orthopedic Surgery

## 2019-05-07 ENCOUNTER — Ambulatory Visit (INDEPENDENT_AMBULATORY_CARE_PROVIDER_SITE_OTHER): Payer: 59 | Admitting: Bariatrics

## 2019-05-07 ENCOUNTER — Encounter (INDEPENDENT_AMBULATORY_CARE_PROVIDER_SITE_OTHER): Payer: Self-pay | Admitting: Bariatrics

## 2019-05-07 ENCOUNTER — Other Ambulatory Visit: Payer: Self-pay

## 2019-05-07 VITALS — BP 131/79 | HR 70 | Temp 97.9°F | Ht 63.0 in | Wt 236.0 lb

## 2019-05-07 DIAGNOSIS — E8881 Metabolic syndrome: Secondary | ICD-10-CM | POA: Diagnosis not present

## 2019-05-07 DIAGNOSIS — Z9189 Other specified personal risk factors, not elsewhere classified: Secondary | ICD-10-CM | POA: Diagnosis not present

## 2019-05-07 DIAGNOSIS — Z6841 Body Mass Index (BMI) 40.0 and over, adult: Secondary | ICD-10-CM

## 2019-05-07 DIAGNOSIS — E559 Vitamin D deficiency, unspecified: Secondary | ICD-10-CM

## 2019-05-07 MED ORDER — SAXENDA 18 MG/3ML ~~LOC~~ SOPN: 3.0000 mg | PEN_INJECTOR | Freq: Every day | SUBCUTANEOUS | 0 refills | Status: DC

## 2019-05-07 NOTE — Progress Notes (Signed)
Office: [phone]  /  Fax: [phone]   HPI:   Chief Complaint: OBESITY Amy Mooney is here to discuss her progress with her obesity treatment plan. She is on the keep a food journal with 300-400 calories and 30+ grams of protein at lunch daily and follow the Category 2 plan and is following her eating plan approximately 75 % of the time. She states she is exercising 0 minutes 0 times per week. Amy Mooney is down 1 lb. She is taking [name] and she is taking 1.8 mg of the Saxenda. She states it is helping with her appetite. She is doing better with her protein.  Her weight is 236 lb (107 kg) today and has had a weight loss of 1 pound over a period of 2 weeks since her last visit. She has lost 8 lbs since starting treatment with [name].  Vitamin D Deficiency Amy Mooney has a diagnosis of vitamin D deficiency. Her Vit D level is well controlled, and she is taking multivitamins. She denies nausea, vomiting or muscle weakness.  Insulin Resistance Amy Mooney has a diagnosis of insulin resistance based on her elevated fasting insulin level >5. Although Amy Mooney's blood glucose readings are still under good control, insulin resistance puts her at greater risk of metabolic syndrome and diabetes. She is not on any medications and notes slight polyphagia. She continues to work on diet and exercise to decrease risk of diabetes.  At risk for diabetes Amy Mooney is at higher than average risk for developing diabetes due to her obesity and insulin resistance. She currently denies polyuria or polydipsia.  ASSESSMENT AND PLAN:  Vitamin D deficiency  Insulin resistance  At risk for diabetes mellitus  Class 3 severe obesity with serious comorbidity and body mass index (BMI) of 40.0 to 44.9 in adult, unspecified obesity type (Amy Mooney) - Plan: Liraglutide -Weight Management (SAXENDA) 18 MG/3ML SOPN  PLAN:  Vitamin D Deficiency Amy Mooney was informed that low vitamin D levels contributes to fatigue and are associated with  obesity, breast, and colon cancer. Amy Mooney agrees to continue taking OTC multivitamins and will follow up for routine testing of vitamin D, at least 2-3 times per year. She was informed of the risk of over-replacement of vitamin D and agrees to not increase her dose unless she discusses this with [name] first. [name] agrees to follow up with our clinic in 2 weeks.  Insulin Resistance Amy Mooney will continue to work on weight loss, exercise, increasing protein, and decreasing simple carbohydrates in her diet to help decrease the risk of diabetes. We dicussed metformin including benefits and risks. She was informed that eating too many simple carbohydrates or too many calories at one sitting increases the likelihood of GI side effects. Amy Mooney agrees to follow up with our clinic in 2 weeks as directed to monitor her progress.  Diabetes risk counseling Amy Mooney was given extended (15 minutes) diabetes prevention counseling today. She is 53 y.o. female and has risk factors for diabetes including obesity and insulin resistance. We discussed intensive lifestyle modifications today with an emphasis on weight loss as well as increasing exercise and decreasing simple carbohydrates in her diet.  Obesity Amy Mooney is currently in the action stage of change. As such, her goal is to continue with weight loss efforts She has agreed to keep a food journal with 300-400 calories and 30+ grams of protein at lunch daily and follow the Category 2 plan Amy Mooney has been instructed to work up to a goal of 150 minutes of combined cardio and strengthening exercise per  week for weight loss and overall health benefits. We discussed the following Behavioral Modification Strategies today: increasing lean protein intake, decreasing simple carbohydrates, increasing vegetables, increase H20 intake, decrease eating out, no skipping meals, work on meal planning and easy cooking plans, and keeping healthy foods in the home We discussed various  medication options to help Amy Mooney with her weight loss efforts and we both agreed to continue Saxenda 3 mg SubQ daily #5 pens and we will refill for 1 month.   Amy Mooney has agreed to follow up with our clinic in 2 weeks. She was informed of the importance of frequent follow up visits to maximize her success with intensive lifestyle modifications for her multiple health conditions.  ALLERGIES: Allergies  Allergen Reactions   Nuvigil [Armodafinil] Hives   Penicillins Rash    Has patient had a PCN reaction causing immediate rash, facial/tongue/throat swelling, SOB or lightheadedness with hypotension: Unknown Has patient had a PCN reaction causing severe rash involving mucus membranes or skin necrosis: Unknown Has patient had a PCN reaction that required hospitalization: Unknown Has patient had a PCN reaction occurring within the last 10 years: Unknown If all of the above answers are "NO", then may proceed with Cephalosporin use.    Vancomycin Hives   Lyrica [Pregabalin] Hives   Bupropion Other (See Comments)    Causes migraines    MEDICATIONS: Current Outpatient Medications on File Prior to Visit  Medication Sig Dispense Refill   Calcium Carb-Cholecalciferol (CALCIUM 600+D) 600-800 MG-UNIT TABS Take 1 tablet by mouth daily.     Cholecalciferol (VITAMIN D3) 5000 units CAPS Take 1 capsule by mouth daily.     Diclofenac Sodium (PENNSAID) 2 % SOLN Pennsaid 20 mg/gram/actuation (2 %) topical soln in metered-dose pump  apply 2 PUMPS twice daily (Patient taking differently: Apply 2 Pump topically 2 (two) times daily as needed (PAIN). Pennsaid 20 mg/gram/actuation (2 %) topical soln in metered-dose pump  apply 2 PUMPS twice daily) 112 g 0   doxycycline (VIBRAMYCIN) 100 MG capsule Take 100 mg by mouth 2 (two) times daily as needed.     fluconazole (DIFLUCAN) 100 MG tablet Take 100 mg by mouth daily as needed (FINGER).      hydrochlorothiazide (HYDRODIURIL) 25 MG tablet Take 1 tablet (25  mg total) by mouth daily. 30 tablet 0   Insulin Pen Needle (BD PEN NEEDLE NANO 2ND GEN) 32G X 4 MM MISC 1 Package by Does not apply route 2 (two) times daily. 100 each 0   loratadine (CLARITIN) 10 MG tablet Take 10 mg by mouth daily as needed for allergies.     Multiple Vitamins-Minerals (HAIR SKIN AND NAILS FORMULA PO) Take 1 tablet by mouth daily.     Multiple Vitamins-Minerals (MULTIVITAMIN WITH MINERALS) tablet Take 1 tablet by mouth daily.     pramipexole (MIRAPEX) 0.25 MG tablet TAKE 1 TABLET BY MOUTH 30 MINUTES TO 1 HOUR PRIOR TO BEDTIME 90 tablet 1   rizatriptan (MAXALT) 10 MG tablet TAKE 1 TABLET BY MOUTH AT MIGRAINE ONSET MAY REPEAT IN 2 HOURS IF HEADACHE PERSISTS.NOT EXCEED 2 TABLETS IN 24HRS (Patient taking differently: Take 10 mg by mouth as needed for migraine. ) 10 tablet 0   valACYclovir (VALTREX) 1000 MG tablet Take 1,000 mg by mouth 2 (two) times daily as needed.     No current facility-administered medications on file prior to visit.     PAST MEDICAL HISTORY: Past Medical History:  Diagnosis Date   Chronic knee pain    arthritis  Fever blister    history of   History of kidney stones    Hypertension    Migraines    Morbid obesity with BMI of 40.0-44.9, adult (HCC)    Narcolepsy without cataplexy(347.00)    Oral herpes    Osteoarthritis    Restless leg    Sleep apnea    per pt not treated; not able to use CPAP due to claustrophobia   Vitamin D deficiency     PAST SURGICAL HISTORY: Past Surgical History:  Procedure Laterality Date   ABDOMINAL HYSTERECTOMY  2010   artroscopic meniscus repair Bilateral    cervix removed, bladder tack     2015   COLONOSCOPY WITH PROPOFOL N/A 06/19/2017   Procedure: COLONOSCOPY WITH PROPOFOL;  Surgeon: [name], MD;  Location: [facility] ENDOSCOPY;  Service: Endoscopy;  Laterality: N/A;   LAPAROSCOPIC SALPINGO OOPHERECTOMY Right 03/22/2017   Procedure: LAPAROSCOPIC SALPINGO OOPHORECTOMY;  Surgeon: Ward,  Honor Loh, MD;  Location: [facility] ORS;  Service: Gynecology;  Laterality: Right;   TOTAL KNEE ARTHROPLASTY Right 10/29/2018   Procedure: TOTAL KNEE ARTHROPLASTY;  Surgeon: Carole Civil, MD;  Location: [facility];  Service: Orthopedics;  Laterality: Right;    SOCIAL HISTORY: Social History   Tobacco Use   Smoking status: Former Smoker    Packs/day: 0.50    Years: 4.00    Pack years: 2.00    Quit date: 10/23/1986    Years since quitting: 32.5   Smokeless tobacco: Never Used  Substance Use Topics   Alcohol use: No   Drug use: No    FAMILY HISTORY: Family History  Problem Relation Age of Onset   Breast cancer Mother 49   Diabetes Mother    Depression Mother    Anxiety disorder Mother    Obesity Mother    Breast cancer Paternal Grandmother     ROS: Review of Systems  Constitutional: Positive for weight loss.  Gastrointestinal: Negative for nausea and vomiting.  Genitourinary: Negative for frequency.  Musculoskeletal:       Negative muscle weakness  Endo/Heme/Allergies: Negative for polydipsia.       Positive polyphagia    PHYSICAL EXAM: Blood pressure 131/79, pulse 70, temperature 97.9 F (36.6 C), temperature source Oral, height 5\' 3"  (1.6 m), weight 236 lb (107 kg), SpO2 96 %. Body mass index is 41.81 kg/m. Physical Exam Vitals signs reviewed.  Constitutional:      Appearance: Normal appearance. She is obese.  Cardiovascular:     Rate and Rhythm: Normal rate.     Pulses: Normal pulses.  Pulmonary:     Effort: Pulmonary effort is normal.     Breath sounds: Normal breath sounds.  Musculoskeletal: Normal range of motion.  Skin:    General: Skin is warm and dry.  Neurological:     Mental Status: She is alert and oriented to person, place, and time.  Psychiatric:        Mood and Affect: Mood normal.        Behavior: Behavior normal.     RECENT LABS AND TESTS: BMET    Component Value Date/Time   NA 142 04/21/2019 1143   NA 137 05/23/2013 1515    K 4.1 04/21/2019 1143   K 3.9 05/23/2013 1515   CL 102 04/21/2019 1143   CL 104 05/23/2013 1515   CO2 26 04/21/2019 1143   CO2 35 (H) 05/23/2013 1515   GLUCOSE 85 04/21/2019 1143   GLUCOSE 124 (H) 10/30/2018 0502   GLUCOSE 68 05/23/2013 1515  BUN 21 04/21/2019 1143   BUN 10 05/23/2013 1515   CREATININE 0.78 04/21/2019 1143   CREATININE 0.86 05/23/2013 1515   CALCIUM 9.8 04/21/2019 1143   CALCIUM 8.4 (L) 05/23/2013 1515   GFRNONAA 88 04/21/2019 1143   GFRNONAA >60 05/23/2013 1515   GFRAA 101 04/21/2019 1143   GFRAA >60 05/23/2013 1515   Lab Results  Component Value Date   HGBA1C 5.6 04/21/2019   HGBA1C 5.6 12/17/2018   HGBA1C 5.7 (H) 08/19/2018   HGBA1C 5.9 06/03/2018   HGBA1C 5.8 04/27/2017   Lab Results  Component Value Date   INSULIN 22.8 04/21/2019   INSULIN 25.6 (H) 12/17/2018   INSULIN 29.0 (H) 08/19/2018   CBC    Component Value Date/Time   WBC 7.0 04/21/2019 1143   WBC 13.4 (H) 10/30/2018 0502   RBC 4.96 04/21/2019 1143   RBC 3.87 10/30/2018 0502   HGB 13.0 04/21/2019 1143   HCT 40.1 04/21/2019 1143   PLT 215 10/30/2018 0502   PLT 225 11/17/2016 1000   MCV 81 04/21/2019 1143   MCV 83 05/14/2013 1149   MCH 26.2 (L) 04/21/2019 1143   MCH 27.1 10/30/2018 0502   MCHC 32.4 04/21/2019 1143   MCHC 31.2 10/30/2018 0502   RDW 15.6 (H) 04/21/2019 1143   RDW 14.1 05/14/2013 1149   LYMPHSABS 2.4 04/21/2019 1143   MONOABS 0.5 10/24/2018 0815   EOSABS 0.2 04/21/2019 1143   BASOSABS 0.1 04/21/2019 1143   Iron/TIBC/Ferritin/ %Sat    Component Value Date/Time   IRON 50 06/24/2014 1542   TIBC 328 06/24/2014 1542   FERRITIN 130 06/24/2014 1542   IRONPCTSAT 15 06/24/2014 1542   Lipid Panel     Component Value Date/Time   CHOL 171 12/17/2018 1034   TRIG 92 12/17/2018 1034   HDL 55 12/17/2018 1034   CHOLHDL 3 06/03/2018 0746   VLDL 28.0 06/03/2018 0746   LDLCALC 98 12/17/2018 1034   Hepatic Function Panel     Component Value Date/Time   PROT 7.4  04/21/2019 1143   ALBUMIN 4.5 04/21/2019 1143   AST 29 04/21/2019 1143   ALT 36 (H) 04/21/2019 1143   ALKPHOS 104 04/21/2019 1143   BILITOT 0.5 04/21/2019 1143      Component Value Date/Time   TSH 2.850 08/19/2018 1005   TSH 1.70 03/24/2016 1259      OBESITY BEHAVIORAL INTERVENTION VISIT  Today's visit was # 13   Starting weight: 244 lbs Starting date: 08/19/18 Today's weight : 236 lbs Today's date: 05/07/2019 Total lbs lost to date: 8    ASK: We discussed the diagnosis of obesity with Amy Mooney today and Amy Mooney agreed to give [name] permission to discuss obesity behavioral modification therapy today.  ASSESS: Amy Mooney has the diagnosis of obesity and her BMI today is 41.82 Amy Mooney is in the action stage of change   ADVISE: Amy Mooney was educated on the multiple health risks of obesity as well as the benefit of weight loss to improve her health. She was advised of the need for long term treatment and the importance of lifestyle modifications to improve her current health and to decrease her risk of future health problems.  AGREE: Multiple dietary modification options and treatment options were discussed and  Amy Mooney agreed to follow the recommendations documented in the above note.  ARRANGE: Amy Mooney was educated on the importance of frequent visits to treat obesity as outlined per CMS and USPSTF guidelines and agreed to schedule her next follow up appointment today.  Wilhemena Durie, am acting as transcriptionist for [employer], DO  I have reviewed the above documentation for accuracy and completeness, and I agree with the above. -Jearld Lesch, DO

## 2019-05-13 ENCOUNTER — Telehealth: Payer: Self-pay | Admitting: Radiology

## 2019-05-13 ENCOUNTER — Other Ambulatory Visit: Payer: Self-pay | Admitting: Orthopedic Surgery

## 2019-05-13 DIAGNOSIS — M1712 Unilateral primary osteoarthritis, left knee: Secondary | ICD-10-CM

## 2019-05-13 NOTE — Telephone Encounter (Signed)
Started Prior auth with [employer] today for her total knee replacement case [id number] will fax clinicals when available, her visit is tomorrow.

## 2019-05-14 ENCOUNTER — Encounter: Payer: Self-pay | Admitting: Orthopedic Surgery

## 2019-05-14 ENCOUNTER — Ambulatory Visit (INDEPENDENT_AMBULATORY_CARE_PROVIDER_SITE_OTHER): Payer: 59 | Admitting: Orthopedic Surgery

## 2019-05-14 ENCOUNTER — Other Ambulatory Visit: Payer: Self-pay

## 2019-05-14 ENCOUNTER — Other Ambulatory Visit: Payer: Self-pay | Admitting: Orthopedic Surgery

## 2019-05-14 VITALS — BP 166/109 | HR 75 | Temp 98.6°F | Ht 63.0 in | Wt 236.0 lb

## 2019-05-14 DIAGNOSIS — M1712 Unilateral primary osteoarthritis, left knee: Secondary | ICD-10-CM

## 2019-05-14 NOTE — Progress Notes (Signed)
Amy Mooney  05/14/2019  HISTORY SECTION :  Chief Complaint  Patient presents with  . Follow-up    Recheck on left knee. DOS on right knee 10-29-18.  . Follow-up   HPI 53 year old female longstanding pain left knee status post right total knee in January wishes to proceed with left total knee with worsening pain decreasing function stiffness.  It was for preop counseling we reviewed the procedure postop course Hospital course devices needed CPM bone foam  She had an allergy to vancomycin we used clindamycin success for the last time she is agreeable for continue with that  She is concerned that she had postop bleeding into the shin and was asking if anything could be done about that.  Short of a drain may be being placed in a tighter capsular closure not sure how to get rid of that.   ROS  No new problems no health changes Past Medical History:  Diagnosis Date  . Chronic knee pain    arthritis  . Fever blister    history of  . History of kidney stones   . Hypertension   . Migraines   . Morbid obesity with BMI of 40.0-44.9, adult ([place])   . 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 Oral herpes   . Osteoarthritis   . Restless leg   . Sleep apnea    per pt not treated; not able to use CPAP due to claustrophobia  . Vitamin D deficiency     Past Surgical History:  Procedure Laterality Date  . ABDOMINAL HYSTERECTOMY  2010  . artroscopic meniscus repair Bilateral   . cervix removed, bladder tack     2015  . COLONOSCOPY WITH PROPOFOL N/A 06/19/2017   Procedure: COLONOSCOPY WITH PROPOFOL;  Surgeon: [name], MD;  Location: [facility] ENDOSCOPY;  Service: Endoscopy;  Laterality: N/A;  . LAPAROSCOPIC SALPINGO OOPHERECTOMY Right 03/22/2017   Procedure: LAPAROSCOPIC SALPINGO OOPHORECTOMY;  Surgeon: Ward, Honor Loh, MD;  Location: [facility] ORS;  Service: Gynecology;  Laterality: Right;  . TOTAL KNEE ARTHROPLASTY Right 10/29/2018   Procedure: TOTAL KNEE ARTHROPLASTY;  Surgeon: Carole Civil, MD;  Location: [facility];  Service: Orthopedics;  Laterality: Right;     Allergies  Allergen Reactions  . Nuvigil [Armodafinil] Hives  . Penicillins Rash    Has patient had a PCN reaction causing immediate rash, facial/tongue/throat swelling, SOB or lightheadedness with hypotension: Unknown Has patient had a PCN reaction causing severe rash involving mucus membranes or skin necrosis: Unknown Has patient had a PCN reaction that required hospitalization: Unknown Has patient had a PCN reaction occurring within the last 10 years: Unknown If all of the above answers are "NO", then may proceed with Cephalosporin use.   . Vancomycin Hives  . Lyrica [Pregabalin] Hives  . Bupropion Other (See Comments)    Causes migraines     Current Outpatient Medications:  .  Calcium Carb-Cholecalciferol (CALCIUM 600+D) 600-800 MG-UNIT TABS, Take 1 tablet by mouth daily., Disp: , Rfl:  .  Cholecalciferol (VITAMIN D3) 5000 units CAPS, Take 1 capsule by mouth daily., Disp: , Rfl:  .  Diclofenac Sodium (PENNSAID) 2 % SOLN, Pennsaid 20 mg/gram/actuation (2 %) topical soln in metered-dose pump  apply 2 PUMPS twice daily (Patient taking differently: Apply 2 Pump topically 2 (two) times daily as needed (PAIN). Pennsaid 20 mg/gram/actuation (2 %) topical soln in metered-dose pump  apply 2 PUMPS twice daily), Disp: 112 g, Rfl: 0 .  doxycycline (VIBRAMYCIN) 100 MG capsule, Take 100 mg by  mouth 2 (two) times daily as needed., Disp: , Rfl:  .  fluconazole (DIFLUCAN) 100 MG tablet, Take 100 mg by mouth daily as needed (FINGER). , Disp: , Rfl:  .  hydrochlorothiazide (HYDRODIURIL) 25 MG tablet, Take 1 tablet (25 mg total) by mouth daily., Disp: 30 tablet, Rfl: 0 .  Insulin Pen Needle (BD PEN NEEDLE NANO 2ND GEN) 32G X 4 MM MISC, 1 Package by Does not apply route 2 (two) times daily., Disp: 100 each, Rfl: 0 .  Liraglutide -Weight Management (SAXENDA) 18 MG/3ML SOPN, Inject 3 mg into the skin daily., Disp: 5 pen, Rfl:  0 .  loratadine (CLARITIN) 10 MG tablet, Take 10 mg by mouth daily as needed for allergies., Disp: , Rfl:  .  Multiple Vitamins-Minerals (HAIR SKIN AND NAILS FORMULA PO), Take 1 tablet by mouth daily., Disp: , Rfl:  .  Multiple Vitamins-Minerals (MULTIVITAMIN WITH MINERALS) tablet, Take 1 tablet by mouth daily., Disp: , Rfl:  .  pramipexole (MIRAPEX) 0.25 MG tablet, TAKE 1 TABLET BY MOUTH 30 MINUTES TO 1 HOUR PRIOR TO BEDTIME, Disp: 90 tablet, Rfl: 1 .  rizatriptan (MAXALT) 10 MG tablet, TAKE 1 TABLET BY MOUTH AT MIGRAINE ONSET MAY REPEAT IN 2 HOURS IF HEADACHE PERSISTS.NOT EXCEED 2 TABLETS IN 24HRS (Patient taking differently: Take 10 mg by mouth as needed for migraine. ), Disp: 10 tablet, Rfl: 0 .  valACYclovir (VALTREX) 1000 MG tablet, Take 1,000 mg by mouth 2 (two) times daily as needed., Disp: , Rfl:    PHYSICAL EXAM SECTION: 1) BP (!) 166/109   Pulse 75   Temp 98.6 F (37 C)   Ht 5\' 3"  (1.6 m)   Wt 236 lb (107 kg)   BMI 41.81 kg/m   Body mass index is 41.81 kg/m. General appearance: Well-developed well-nourished no gross deformities  2) Cardiovascular normal pulse and perfusion in all 4 extremities normal color without edema  3) Neurologically deep tendon reflexes are equal and normal, no sensation loss or deficits no pathologic reflexes  4) Psychological: Awake alert and oriented x3 mood and affect normal  5) Skin no lacerations or ulcerations no nodularity no palpable masses, no erythema or nodularity  6) Musculoskeletal:  Stiff left knee Currently no cane  MEDICAL DECISION SECTION:  No diagnosis found.  Imaging Imaging from 2019 shows varus osteoarthritis  Plan:  (Rx., Inj., surg., Frx, MRI/CT, XR:2)  8:53 AM 15 minutes of counseling for preop surgery, left total knee  The procedure has been fully reviewed with the patient; The risks and benefits of surgery have been discussed and explained and understood. Alternative treatment has also been reviewed, questions  were encouraged and answered. The postoperative plan is also been reviewed.

## 2019-05-14 NOTE — Telephone Encounter (Signed)
Faxed to [employer]

## 2019-05-16 ENCOUNTER — Telehealth: Payer: Self-pay

## 2019-05-16 NOTE — Telephone Encounter (Signed)
Patient was scheduled to come in on 8/19 to get sutures out because she is having TKA(L)surgery on 8/4. I called her to move her appointment to 8/24 and she is asking if that is too long to wait to get sutures out. She did reschedule to /24, but kept asking about the length of time the sutures would be in.  Please call and advise

## 2019-05-16 NOTE — Telephone Encounter (Signed)
Is there anyone here who can take out staples that week ?  It would be best for them to come out on 8/19, but I am out of town.

## 2019-05-22 NOTE — Patient Instructions (Signed)
Amy Mooney  05/22/2019     @PREFPERIOPPHARMACY @   Your procedure is scheduled on  05/27/2019  Report to [name] at  615  A.M.  Call this number if you have problems the morning of surgery:  [phone]   Remember:  Do not eat or drink after midnight.                      Take these medicines the morning of surgery with A SIP OF WATER  Maxalt( if needed),    Do not wear jewelry, make-up or nail polish.  Do not wear lotions, powders, or perfumes, or deodorant.  Do not shave 48 hours prior to surgery.  Men may shave face and neck.  Do not bring valuables to the hospital.  [facility] is not responsible for any belongings or valuables.  Contacts, dentures or bridgework may not be worn into surgery.  Leave your suitcase in the car.  After surgery it may be brought to your room.  For patients admitted to the hospital, discharge time will be determined by your treatment team.  Patients discharged the day of surgery will not be allowed to drive home.   Name and phone number of your driver:   family Special instructions:  None  Please read over the following fact sheets that you were given. Pain Booklet, Coughing and Deep Breathing, Blood Transfusion Information, Total Joint Packet, MRSA Information, Surgical Site Infection Prevention, Anesthesia Post-op Instructions and Care and Recovery After Surgery       Total Knee Replacement, Care After This sheet gives you information about how to care for yourself after your procedure. Your health care provider may also give you more specific instructions. If you have problems or questions, contact your health care provider. What can I expect after the procedure? After the procedure, it is common to have:  Pain.  Swelling.  A small amount of blood or clear fluid coming from your incision.  Limited range of motion. Follow these instructions at home: Medicines  Take over-the-counter and prescription medicines  only as told by your health care provider.  If you were prescribed a blood thinner (anticoagulant), take it as told by your health care provider.  Ask your health care provider if the medicine prescribed to you: ? Requires you to avoid driving or using heavy machinery. ? Can cause constipation. You may need to take actions to prevent or treat constipation, such as:  Drink enough fluid to keep your urine pale yellow.  Take over-the-counter or prescription medicines.  Eat foods that are high in fiber, such as beans, whole grains, and fresh fruits and vegetables.  Limit foods that are high in fat and processed sugars, such as fried or sweet foods. Bathing  Do not take baths, swim, or use a hot tub until your health care provider approves. Ask your health care provider if you may take showers. You may only be allowed to take sponge baths.  Keep your bandage (dressing) dry until your health care provider says it can be removed. Incision care and drain care   Follow instructions from your health care provider about how to take care of your incision. Make sure you: ? Wash your hands with soap and water before and after you change your dressing. If soap and water are not available, use hand sanitizer. ? Change your dressing as told by your health care provider. ? Leave stitches (  sutures), skin glue, or adhesive strips in place. These skin closures may need to stay in place for 2 weeks or longer. If adhesive strip edges start to loosen and curl up, you may trim the loose edges. Do not remove adhesive strips completely unless your health care provider tells you to do that.  Check your incision area and drain site every day for signs of infection. Check for: ? More redness, swelling, or pain. ? More fluid or blood. ? Warmth. ? Pus or a bad smell.  If you have a drain, follow instructions from your health care provider about caring for it. Managing pain, stiffness, and swelling      If  directed, put ice on your knee. ? Put ice in a plastic bag or use the icing device (cold flow pad or cryocuff) that you were given. Follow instructions from your health care provider about how to use the icing device. ? Place a towel between your skin and the bag or between your skin and the icing device. ? Leave the ice on for 20 minutes, 2-3 times per day.  If directed, apply heat to the affected area before you exercise. Use the heat source that your health care provider recommends, such as a moist heat pack or a heating pad. ? Place a towel between your skin and the heat source. ? Leave the heat on for 20-30 minutes. ? Remove the heat if your skin turns bright red. This is especially important if you are unable to feel pain, heat, or cold. You may have a greater risk of getting burned.  Move your toes often to avoid stiffness and to lessen swelling.  Raise (elevate) your leg above the level of your heart while you are sitting or lying down. ? Use several pillows to keep your leg straight. ? Do not put a pillow just under the knee. If the knee is bent for a long time, this may lead to stiffness.  Wear elastic knee support as told by your health care provider. Activity  Rest as told by your health care provider.  Avoid sitting for a long time without moving. Get up to take short walks every 1-2 hours. This is important to improve blood flow and breathing. Ask for help if you feel weak or unsteady.  Ask your health care provider what activities are safe for you.  Avoid high-impact activities, including running, jumping rope, and jumping jacks.  Do not play contact sports until your health care provider approves.  Do exercises as told by your physical therapist.  If you have been sent home with a continuous passive motion machine, use it as told by your health care provider. Safety   Do not use your leg to support your body weight until your health care provider approves. Use  crutches or a walker as told by your health care provider.  Do not drive until your health care provider approves. Ask your health care provider when it is safe to drive. General instructions  Do not use any products that contain nicotine or tobacco, such as cigarettes, e-cigarettes, and chewing tobacco. These can delay healing after surgery. If you need help quitting, ask your health care provider.  Wear compression stockings as told by your health care provider.  Tell your health care provider if you plan to have dental work. Also, tell your dentist about your joint replacement.  Keep all follow-up visits as told by your health care provider. This is important. Contact a health care  provider if you have:  More redness, swelling, or pain around your incision or drain.  More fluid or blood coming from your incision or drain.  Pus or a bad smell coming from your incision or drain.  Warmth on your incision or drain site.  A fever.  An incision that breaks open.  Knee pain that does not go away.  Range of motion in your knee that is getting worse.  A prosthesis that feels loose. Get help right away if you have:  Pain or swelling in your calf or thigh.  Shortness of breath or difficulty breathing.  Chest pain. Summary  After the procedure, it is common to have pain and swelling, blood or fluid coming from your incision, and limited range of motion.  Follow instructions from your health care provider about how to take care of your incision.  Use crutches or a walker as told by your health care provider.  If you were prescribed a blood thinner (anticoagulant), take it as told by your health care provider.  Keep all follow-up visits as told by your health care provider. This is important. This information is not intended to replace advice given to you by your health care provider. Make sure you discuss any questions you have with your health care provider. Document  Released: 04/28/2005 Document Revised: 11/01/2018 Document Reviewed: 05/23/2018 Elsevier Interactive Patient Education  [place].  Spinal Anesthesia and Epidural Anesthesia, Care After This sheet gives you information about how to care for yourself after your procedure. Your doctor may also give you more specific instructions. If you have problems or questions, call your doctor. Follow these instructions at home: For at least 24 hours after the procedure:   Have a responsible adult stay with you. It is important to have someone help care for you until you are awake and alert.  Rest as needed.  Do not do activities where you could fall or get hurt (injured).  Do not drive.  Do not use heavy machinery.  Do not drink alcohol.  Do not take sleeping pills or medicines that make you sleepy (drowsy).  Do not make important decisions.  Do not sign legal documents.  Do not take care of children on your own. Eating and drinking  If you throw up (vomit), drink water, juice, or soup when nausea and vomiting stop.  Drink enough fluid to keep your pee (urine) pale yellow.  Make sure you do not feel like throwing up (nauseous) before you eat solid foods.  Follow the diet that your doctor recommends. General instructions  Return to your normal activities as told by your doctor. Ask your doctor what activities are safe for you.  Take over-the-counter and prescription medicines only as told by your doctor.  If you have sleep apnea, surgery and certain medicines can raise your risk for breathing problems. Follow instructions from your doctor about when to wear your sleep device. Your doctor may tell you to wear your sleep device: ? Anytime you are sleeping, including during daytime naps. ? While taking prescription pain medicines, sleeping pills, or medicines that make you sleepy.  Do not use any products that contain nicotine or tobacco. This includes cigarettes and  e-cigarettes. ? If you need help quitting, ask your doctor. ? If you smoke, do not smoke by yourself. Make sure someone is nearby in case you need help.  Keep all follow-up visits as told by your doctor. This is important. Contact a doctor if:  It has  been more than one day since your procedure and you feel like throwing up.  It has been more than one day since your procedure and you throw up.  You have a rash. Get help right away if:  You have a fever.  You have a headache that lasts a long time.  You have a very bad headache.  Your vision is blurry.  You see two of a single object (double vision).  You are dizzy or light-headed.  You faint.  Your arms or legs tingle, feel weak, or get numb.  You have trouble breathing.  You cannot pee (urinate). Summary  After the procedure, have a responsible adult stay with you at home until you are fully awake and alert.  Do not do activities that might get you injured. Do not drive, use heavy machinery, drink alcohol, or make important decisions for 24 hours after the procedure.  Take medicines as told by your doctor. Do not use products that contain nicotine or tobacco.  Get help right away if you have a fever, blurry vision, difficulty breathing or passing urine, or weakness or numbness in arms or legs. This information is not intended to replace advice given to you by your health care provider. Make sure you discuss any questions you have with your health care provider. Document Released: 01/31/2016 Document Revised: 09/21/2017 Document Reviewed: 01/31/2016 Elsevier Patient Education  2020 [place] Anesthesia, Adult, Care After This sheet gives you information about how to care for yourself after your procedure. Your health care provider may also give you more specific instructions. If you have problems or questions, contact your health care provider. What can I expect after the procedure? After the procedure,  the following side effects are common:  Pain or discomfort at the IV site.  Nausea.  Vomiting.  Sore throat.  Trouble concentrating.  Feeling cold or chills.  Weak or tired.  Sleepiness and fatigue.  Soreness and body aches. These side effects can affect parts of the body that were not involved in surgery. Follow these instructions at home:  For at least 24 hours after the procedure:  Have a responsible adult stay with you. It is important to have someone help care for you until you are awake and alert.  Rest as needed.  Do not: ? Participate in activities in which you could fall or become injured. ? Drive. ? Use heavy machinery. ? Drink alcohol. ? Take sleeping pills or medicines that cause drowsiness. ? Make important decisions or sign legal documents. ? Take care of children on your own. Eating and drinking  Follow any instructions from your health care provider about eating or drinking restrictions.  When you feel hungry, start by eating small amounts of foods that are soft and easy to digest (bland), such as toast. Gradually return to your regular diet.  Drink enough fluid to keep your urine pale yellow.  If you vomit, rehydrate by drinking water, juice, or clear broth. General instructions  If you have sleep apnea, surgery and certain medicines can increase your risk for breathing problems. Follow instructions from your health care provider about wearing your sleep device: ? Anytime you are sleeping, including during daytime naps. ? While taking prescription pain medicines, sleeping medicines, or medicines that make you drowsy.  Return to your normal activities as told by your health care provider. Ask your health care provider what activities are safe for you.  Take over-the-counter and prescription medicines only as told by your  health care provider.  If you smoke, do not smoke without supervision.  Keep all follow-up visits as told by your health care  provider. This is important. Contact a health care provider if:  You have nausea or vomiting that does not get better with medicine.  You cannot eat or drink without vomiting.  You have pain that does not get better with medicine.  You are unable to pass urine.  You develop a skin rash.  You have a fever.  You have redness around your IV site that gets worse. Get help right away if:  You have difficulty breathing.  You have chest pain.  You have blood in your urine or stool, or you vomit blood. Summary  After the procedure, it is common to have a sore throat or nausea. It is also common to feel tired.  Have a responsible adult stay with you for the first 24 hours after general anesthesia. It is important to have someone help care for you until you are awake and alert.  When you feel hungry, start by eating small amounts of foods that are soft and easy to digest (bland), such as toast. Gradually return to your regular diet.  Drink enough fluid to keep your urine pale yellow.  Return to your normal activities as told by your health care provider. Ask your health care provider what activities are safe for you. This information is not intended to replace advice given to you by your health care provider. Make sure you discuss any questions you have with your health care provider. Document Released: 01/15/2001 Document Revised: 10/12/2017 Document Reviewed: 05/25/2017 Elsevier Patient Education  2020 [employer]. How to Use Chlorhexidine for Bathing Chlorhexidine gluconate (CHG) is a germ-killing (antiseptic) solution that is used to clean the skin. It can get rid of the bacteria that normally live on the skin and can keep them away for about 24 hours. To clean your skin with CHG, you may be given:  A CHG solution to use in the shower or as part of a sponge bath.  A prepackaged cloth that contains CHG. Cleaning your skin with CHG may help lower the risk for infection:  While  you are staying in the intensive care unit of the hospital.  If you have a vascular access, such as a central line, to provide short-term or long-term access to your veins.  If you have a catheter to drain urine from your bladder.  If you are on a ventilator. A ventilator is a machine that helps you breathe by moving air in and out of your lungs.  After surgery. What are the risks? Risks of using CHG include:  A skin reaction.  Hearing loss, if CHG gets in your ears.  Eye injury, if CHG gets in your eyes and is not rinsed out.  The CHG product catching fire. Make sure that you avoid smoking and flames after applying CHG to your skin. Do not use CHG:  If you have a chlorhexidine allergy or have previously reacted to chlorhexidine.  On babies younger than 37 months of age. How to use CHG solution  Use CHG only as told by your health care provider, and follow the instructions on the label.  Use the full amount of CHG as directed. Usually, this is one bottle. During a shower Follow these steps when using CHG solution during a shower (unless your health care provider gives you different instructions): 1. Start the shower. 2. Use your normal soap and shampoo  to wash your face and hair. 3. Turn off the shower or move out of the shower stream. 4. Pour the CHG onto a clean washcloth. Do not use any type of brush or rough-edged sponge. 5. Starting at your neck, lather your body down to your toes. Make sure you follow these instructions: ? If you will be having surgery, pay special attention to the part of your body where you will be having surgery. Scrub this area for at least 1 minute. ? Do not use CHG on your head or face. If the solution gets into your ears or eyes, rinse them well with water. ? Avoid your genital area. ? Avoid any areas of skin that have broken skin, cuts, or scrapes. ? Scrub your back and under your arms. Make sure to wash skin folds. 6. Let the lather sit on your  skin for 1-2 minutes or as long as told by your health care provider. 7. Thoroughly rinse your entire body in the shower. Make sure that all body creases and crevices are rinsed well. 8. Dry off with a clean towel. Do not put any substances on your body afterward-such as powder, lotion, or perfume-unless you are told to do so by your health care provider. Only use lotions that are recommended by the manufacturer. 9. Put on clean clothes or pajamas. 10. If it is the night before your surgery, sleep in clean sheets.  During a sponge bath Follow these steps when using CHG solution during a sponge bath (unless your health care provider gives you different instructions): 1. Use your normal soap and shampoo to wash your face and hair. 2. Pour the CHG onto a clean washcloth. 3. Starting at your neck, lather your body down to your toes. Make sure you follow these instructions: ? If you will be having surgery, pay special attention to the part of your body where you will be having surgery. Scrub this area for at least 1 minute. ? Do not use CHG on your head or face. If the solution gets into your ears or eyes, rinse them well with water. ? Avoid your genital area. ? Avoid any areas of skin that have broken skin, cuts, or scrapes. ? Scrub your back and under your arms. Make sure to wash skin folds. 4. Let the lather sit on your skin for 1-2 minutes or as long as told by your health care provider. 5. Using a different clean, wet washcloth, thoroughly rinse your entire body. Make sure that all body creases and crevices are rinsed well. 6. Dry off with a clean towel. Do not put any substances on your body afterward-such as powder, lotion, or perfume-unless you are told to do so by your health care provider. Only use lotions that are recommended by the manufacturer. 7. Put on clean clothes or pajamas. 8. If it is the night before your surgery, sleep in clean sheets. How to use CHG prepackaged cloths  Only  use CHG cloths as told by your health care provider, and follow the instructions on the label.  Use the CHG cloth on clean, dry skin.  Do not use the CHG cloth on your head or face unless your health care provider tells you to.  When washing with the CHG cloth: ? Avoid your genital area. ? Avoid any areas of skin that have broken skin, cuts, or scrapes. Before surgery Follow these steps when using a CHG cloth to clean before surgery (unless your health care provider gives you different  instructions): 1. Using the CHG cloth, vigorously scrub the part of your body where you will be having surgery. Scrub using a back-and-forth motion for 3 minutes. The area on your body should be completely wet with CHG when you are done scrubbing. 2. Do not rinse. Discard the cloth and let the area air-dry. Do not put any substances on the area afterward, such as powder, lotion, or perfume. 3. Put on clean clothes or pajamas. 4. If it is the night before your surgery, sleep in clean sheets.  For general bathing Follow these steps when using CHG cloths for general bathing (unless your health care provider gives you different instructions). 1. Use a separate CHG cloth for each area of your body. Make sure you wash between any folds of skin and between your fingers and toes. Wash your body in the following order, switching to a new cloth after each step: ? The front of your neck, shoulders, and chest. ? Both of your arms, under your arms, and your hands. ? Your stomach and groin area, avoiding the genitals. ? Your right leg and foot. ? Your left leg and foot. ? The back of your neck, your back, and your buttocks. 2. Do not rinse. Discard the cloth and let the area air-dry. Do not put any substances on your body afterward-such as powder, lotion, or perfume-unless you are told to do so by your health care provider. Only use lotions that are recommended by the manufacturer. 3. Put on clean clothes or pajamas.  Contact a health care provider if:  Your skin gets irritated after scrubbing.  You have questions about using your solution or cloth. Get help right away if:  Your eyes become very red or swollen.  Your eyes itch badly.  Your skin itches badly and is red or swollen.  Your hearing changes.  You have trouble seeing.  You have swelling or tingling in your mouth or throat.  You have trouble breathing.  You swallow any chlorhexidine. Summary  Chlorhexidine gluconate (CHG) is a germ-killing (antiseptic) solution that is used to clean the skin. Cleaning your skin with CHG may help to lower your risk for infection.  You may be given CHG to use for bathing. It may be in a bottle or in a prepackaged cloth to use on your skin. Carefully follow your health care provider's instructions and the instructions on the product label.  Do not use CHG if you have a chlorhexidine allergy.  Contact your health care provider if your skin gets irritated after scrubbing. This information is not intended to replace advice given to you by your health care provider. Make sure you discuss any questions you have with your health care provider. Document Released: 07/03/2012 Document Revised: 12/26/2018 Document Reviewed: 09/06/2017 Elsevier Patient Education  2020 [employer].

## 2019-05-23 ENCOUNTER — Other Ambulatory Visit (HOSPITAL_COMMUNITY)
Admission: RE | Admit: 2019-05-23 | Discharge: 2019-05-23 | Disposition: A | Discharge status: Home / Self Care | Payer: 59 | Source: Ambulatory Visit | Attending: Orthopedic Surgery | Admitting: Orthopedic Surgery

## 2019-05-23 ENCOUNTER — Encounter (HOSPITAL_COMMUNITY): Payer: Self-pay

## 2019-05-23 ENCOUNTER — Encounter (HOSPITAL_COMMUNITY)
Admission: RE | Admit: 2019-05-23 | Discharge: 2019-05-23 | Disposition: A | Discharge status: Home / Self Care | Payer: 59 | Source: Ambulatory Visit | Attending: Orthopedic Surgery | Admitting: Orthopedic Surgery

## 2019-05-23 ENCOUNTER — Other Ambulatory Visit: Payer: Self-pay

## 2019-05-23 DIAGNOSIS — Z20828 Contact with and (suspected) exposure to other viral communicable diseases: Secondary | ICD-10-CM | POA: Insufficient documentation

## 2019-05-23 DIAGNOSIS — Z01812 Encounter for preprocedural laboratory examination: Secondary | ICD-10-CM | POA: Diagnosis not present

## 2019-05-23 LAB — CBC WITH DIFFERENTIAL/PLATELET
Abs Immature Granulocytes: 0.02 10*3/uL (ref 0.00–0.07)
Basophils Absolute: 0 10*3/uL (ref 0.0–0.1)
Basophils Relative: 1 %
Eosinophils Absolute: 0.2 10*3/uL (ref 0.0–0.5)
Eosinophils Relative: 2 %
HCT: 38.8 % (ref 36.0–46.0)
Hemoglobin: 12.2 g/dL (ref 12.0–15.0)
Immature Granulocytes: 0 %
Lymphocytes Relative: 36 %
Lymphs Abs: 2.9 10*3/uL (ref 0.7–4.0)
MCH: 26.6 pg (ref 26.0–34.0)
MCHC: 31.4 g/dL (ref 30.0–36.0)
MCV: 84.7 fL (ref 80.0–100.0)
Monocytes Absolute: 0.5 10*3/uL (ref 0.1–1.0)
Monocytes Relative: 6 %
Neutro Abs: 4.4 10*3/uL (ref 1.7–7.7)
Neutrophils Relative %: 55 %
Platelets: 218 10*3/uL (ref 150–400)
RBC: 4.58 MIL/uL (ref 3.87–5.11)
RDW: 14.8 % (ref 11.5–15.5)
WBC: 8.1 10*3/uL (ref 4.0–10.5)
nRBC: 0 % (ref 0.0–0.2)

## 2019-05-23 LAB — BASIC METABOLIC PANEL
Anion gap: 8 (ref 5–15)
BUN: 25 mg/dL — ABNORMAL HIGH (ref 6–20)
CO2: 27 mmol/L (ref 22–32)
Calcium: 9.2 mg/dL (ref 8.9–10.3)
Chloride: 105 mmol/L (ref 98–111)
Creatinine, Ser: 1.01 mg/dL — ABNORMAL HIGH (ref 0.44–1.00)
GFR calc Af Amer: >60 mL/min (ref >60)
GFR calc non Af Amer: >60 mL/min (ref >60)
Glucose, Bld: 78 mg/dL (ref 70–99)
Potassium: 3.1 mmol/L — ABNORMAL LOW (ref 3.5–5.1)
Sodium: 140 mmol/L (ref 135–145)

## 2019-05-23 LAB — SARS CORONAVIRUS 2 (TAT 6-24 HRS): SARS Coronavirus 2: NEGATIVE

## 2019-05-25 LAB — PREPARE RBC (CROSSMATCH)

## 2019-05-26 ENCOUNTER — Other Ambulatory Visit: Payer: Self-pay | Admitting: Orthopedic Surgery

## 2019-05-26 ENCOUNTER — Telehealth: Payer: Self-pay | Admitting: Radiology

## 2019-05-26 DIAGNOSIS — M1712 Unilateral primary osteoarthritis, left knee: Secondary | ICD-10-CM

## 2019-05-26 NOTE — Telephone Encounter (Signed)
Knee replacement approved for inpatient from 05/27/19 until 05/29/19 FYI only

## 2019-05-26 NOTE — H&P (Signed)
Amy Mooney  05/26/2019  HISTORY SECTION :  No chief complaint on file.  53 year old female with history of right total knee high BMI presents for left total knee.  She understands the risks and benefits of surgery in the setting of moderate obesity.  She has a history of severe pain in her left knee especially on the medial side with decreased range of motion decreased function.  Pain is not relieved by NSAIDs or injection.  Functional deficits of climbing stairs squatting kneeling bending are also present     Review of Systems  All other systems reviewed and are negative.    Past Medical History:  Diagnosis Date  . Chronic knee pain    arthritis  . Fever blister    history of  . History of kidney stones   . Hypertension   . Migraines   . Morbid obesity with BMI of 40.0-44.9, adult ([place])   . 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 Oral herpes   . Osteoarthritis   . Restless leg   . Sleep apnea    per pt not treated; not able to use CPAP due to claustrophobia  . Vitamin D deficiency     Past Surgical History:  Procedure Laterality Date  . ABDOMINAL HYSTERECTOMY  2010  . artroscopic meniscus repair Bilateral   . cervix removed, bladder tack     2015  . COLONOSCOPY WITH PROPOFOL N/A 06/19/2017   Procedure: COLONOSCOPY WITH PROPOFOL;  Surgeon: [name], MD;  Location: [facility] ENDOSCOPY;  Service: Endoscopy;  Laterality: N/A;  . LAPAROSCOPIC SALPINGO OOPHERECTOMY Right 03/22/2017   Procedure: LAPAROSCOPIC SALPINGO OOPHORECTOMY;  Surgeon: Ward, Honor Loh, MD;  Location: [facility] ORS;  Service: Gynecology;  Laterality: Right;  . TOTAL KNEE ARTHROPLASTY Right 10/29/2018   Procedure: TOTAL KNEE ARTHROPLASTY;  Surgeon: Carole Civil, MD;  Location: [facility] ORS;  Service: Orthopedics;  Laterality: Right;     Allergies  Allergen Reactions  . Nuvigil [Armodafinil] Hives  . Penicillins Rash and Other (See Comments)    Has patient had a PCN reaction causing immediate rash,  facial/tongue/throat swelling, SOB or lightheadedness with hypotension: Unknown Has patient had a PCN reaction causing severe rash involving mucus membranes or skin necrosis: Unknown Has patient had a PCN reaction that required hospitalization: Unknown Has patient had a PCN reaction occurring within the last 10 years: Unknown If all of the above answers are "NO", then may proceed with Cephalosporin use.   . Vancomycin Hives  . Lyrica [Pregabalin] Hives  . Bupropion Other (See Comments)    Causes migraines    No current facility-administered medications for this encounter.   Current Outpatient Medications:  .  Calcium Carb-Cholecalciferol (CALCIUM + D3 PO), Take 1 tablet by mouth at bedtime., Disp: , Rfl:  .  Cholecalciferol (VITAMIN D3) 5000 units CAPS, Take 5,000 Units by mouth at bedtime. , Disp: , Rfl:  .  diclofenac (VOLTAREN) 75 MG EC tablet, Take 75 mg by mouth 2 (two) times a day., Disp: , Rfl:  .  doxycycline (VIBRA-TABS) 100 MG tablet, Take 100 mg by mouth daily as needed (acne). , Disp: , Rfl:  .  hydrochlorothiazide (HYDRODIURIL) 25 MG tablet, Take 1 tablet (25 mg total) by mouth daily., Disp: 30 tablet, Rfl: 0 .  Liraglutide -Weight Management (SAXENDA) 18 MG/3ML SOPN, Inject 3 mg into the skin daily. (Patient taking differently: Inject 1.8 mg into the skin daily. ), Disp: 5 pen, Rfl: 0 .  loratadine (CLARITIN) 10 MG tablet, Take 10  mg by mouth daily as needed for allergies., Disp: , Rfl:  .  Multiple Vitamins-Minerals (HAIR SKIN AND NAILS FORMULA PO), Take 1 tablet by mouth daily., Disp: , Rfl:  .  Multiple Vitamins-Minerals (MULTIVITAMIN WITH MINERALS) tablet, Take 1 tablet by mouth daily., Disp: , Rfl:  .  pramipexole (MIRAPEX) 0.25 MG tablet, TAKE 1 TABLET BY MOUTH 30 MINUTES TO 1 HOUR PRIOR TO BEDTIME (Patient taking differently: Take 0.25 mg by mouth at bedtime. Take 1 tablet by mouth 30 minutes to 1 hour prior to bedtime.), Disp: 90 tablet, Rfl: 1 .  rizatriptan (MAXALT)  10 MG tablet, TAKE 1 TABLET BY MOUTH AT MIGRAINE ONSET MAY REPEAT IN 2 HOURS IF HEADACHE PERSISTS.NOT EXCEED 2 TABLETS IN 24HRS (Patient taking differently: Take 10 mg by mouth 2 (two) times daily as needed for migraine. ), Disp: 10 tablet, Rfl: 0 .  valACYclovir (VALTREX) 1000 MG tablet, Take 1,000 mg by mouth 2 (two) times daily as needed (fever blisters/cold sores.). , Disp: , Rfl:  .  fluconazole (DIFLUCAN) 100 MG tablet, Take 100 mg by mouth daily as needed (FINGER). , Disp: , Rfl:  .  Insulin Pen Needle (BD PEN NEEDLE NANO 2ND GEN) 32G X 4 MM MISC, 1 Package by Does not apply route 2 (two) times daily., Disp: 100 each, Rfl: 0   PHYSICAL EXAM SECTION: 1) There were no vitals taken for this visit.  There is no height or weight on file to calculate BMI. General appearance: Well-developed well-nourished no gross deformities  2) Cardiovascular normal pulse and perfusion in all 4 extremities normal color without edema  3) Neurologically deep tendon reflexes are equal and normal, no sensation loss or deficits no pathologic reflexes  4) Psychological: Awake alert and oriented x3 mood and affect normal  5) Skin no lacerations or ulcerations no nodularity no palpable masses, no erythema or nodularity  6) Musculoskeletal:   Left knee tenderness medial joint line, knee flexion arc 110 degrees.  Stable in all planes.  Strength normal.  Upper extremities normal range of motion alignment stability strength and no subluxation of the joints  Right total knee functioning well  MEDICAL DECISION SECTION:  Diagnosis of osteoarthritis left knee   X-rays show osteoarthritis with varus deformity left knee   Plan:   Left total knee  The procedure has been fully reviewed with the patient; The risks and benefits of surgery have been discussed and explained and understood. Alternative treatment has also been reviewed, questions were encouraged and answered. The postoperative plan is also been  reviewed.   3:00 PM Arther Abbott, MD  05/26/2019

## 2019-05-27 ENCOUNTER — Inpatient Hospital Stay (HOSPITAL_COMMUNITY): Payer: 59 | Admitting: Anesthesiology

## 2019-05-27 ENCOUNTER — Other Ambulatory Visit: Payer: Self-pay

## 2019-05-27 ENCOUNTER — Inpatient Hospital Stay (HOSPITAL_COMMUNITY)
Admission: RE | Admit: 2019-05-27 | Discharge: 2019-05-29 | DRG: 470 | Disposition: A | Discharge status: Home Health | Payer: 59 | Attending: Orthopedic Surgery | Admitting: Orthopedic Surgery

## 2019-05-27 ENCOUNTER — Inpatient Hospital Stay (HOSPITAL_COMMUNITY): Payer: 59

## 2019-05-27 ENCOUNTER — Encounter (HOSPITAL_COMMUNITY)
Admission: RE | Disposition: A | Discharge status: Home Health | Payer: Self-pay | Source: Home / Self Care | Attending: Orthopedic Surgery

## 2019-05-27 ENCOUNTER — Encounter (HOSPITAL_COMMUNITY): Payer: Self-pay | Admitting: *Deleted

## 2019-05-27 DIAGNOSIS — Z888 Allergy status to other drugs, medicaments and biological substances status: Secondary | ICD-10-CM | POA: Diagnosis not present

## 2019-05-27 DIAGNOSIS — Z79899 Other long term (current) drug therapy: Secondary | ICD-10-CM

## 2019-05-27 DIAGNOSIS — G8929 Other chronic pain: Secondary | ICD-10-CM | POA: Diagnosis present

## 2019-05-27 DIAGNOSIS — M25569 Pain in unspecified knee: Secondary | ICD-10-CM | POA: Diagnosis present

## 2019-05-27 DIAGNOSIS — G2581 Restless legs syndrome: Secondary | ICD-10-CM | POA: Diagnosis present

## 2019-05-27 DIAGNOSIS — Z880 Allergy status to penicillin: Secondary | ICD-10-CM | POA: Diagnosis not present

## 2019-05-27 DIAGNOSIS — M1712 Unilateral primary osteoarthritis, left knee: Principal | ICD-10-CM | POA: Diagnosis present

## 2019-05-27 DIAGNOSIS — Z881 Allergy status to other antibiotic agents status: Secondary | ICD-10-CM | POA: Diagnosis not present

## 2019-05-27 DIAGNOSIS — E559 Vitamin D deficiency, unspecified: Secondary | ICD-10-CM | POA: Diagnosis present

## 2019-05-27 DIAGNOSIS — G8918 Other acute postprocedural pain: Secondary | ICD-10-CM | POA: Diagnosis not present

## 2019-05-27 DIAGNOSIS — Z794 Long term (current) use of insulin: Secondary | ICD-10-CM | POA: Diagnosis not present

## 2019-05-27 DIAGNOSIS — M21162 Varus deformity, not elsewhere classified, left knee: Secondary | ICD-10-CM | POA: Diagnosis present

## 2019-05-27 DIAGNOSIS — M25762 Osteophyte, left knee: Secondary | ICD-10-CM | POA: Diagnosis present

## 2019-05-27 DIAGNOSIS — Z96652 Presence of left artificial knee joint: Secondary | ICD-10-CM

## 2019-05-27 DIAGNOSIS — I10 Essential (primary) hypertension: Secondary | ICD-10-CM | POA: Diagnosis present

## 2019-05-27 DIAGNOSIS — Z6841 Body Mass Index (BMI) 40.0 and over, adult: Secondary | ICD-10-CM | POA: Diagnosis not present

## 2019-05-27 DIAGNOSIS — Z471 Aftercare following joint replacement surgery: Secondary | ICD-10-CM | POA: Diagnosis not present

## 2019-05-27 DIAGNOSIS — B002 Herpesviral gingivostomatitis and pharyngotonsillitis: Secondary | ICD-10-CM | POA: Diagnosis present

## 2019-05-27 HISTORY — PX: TOTAL KNEE ARTHROPLASTY: SHX125

## 2019-05-27 SURGERY — ARTHROPLASTY, KNEE, TOTAL
Anesthesia: Choice | Laterality: Left

## 2019-05-27 SURGERY — ARTHROPLASTY, KNEE, TOTAL
Anesthesia: Spinal | Site: Knee | Laterality: Left

## 2019-05-27 MED ORDER — METHOCARBAMOL 1000 MG/10ML IJ SOLN
500.0000 mg | Freq: Four times a day (QID) | INTRAVENOUS | Status: DC | PRN
  Filled 2019-05-27: qty 5

## 2019-05-27 MED ORDER — ONDANSETRON HCL 4 MG/2ML IJ SOLN: 4.0000 mg | Freq: Four times a day (QID) | INTRAMUSCULAR | Status: DC | PRN

## 2019-05-27 MED ORDER — MIDAZOLAM HCL 2 MG/2ML IJ SOLN
INTRAMUSCULAR | Status: AC
  Filled 2019-05-27: qty 2

## 2019-05-27 MED ORDER — BUPIVACAINE-EPINEPHRINE (PF) 0.5% -1:200000 IJ SOLN
INTRAMUSCULAR | Status: DC | PRN
  Administered 2019-05-27: 20 mL via PERINEURAL

## 2019-05-27 MED ORDER — FLEET ENEMA 7-19 GM/118ML RE ENEM: 1.0000 | ENEMA | Freq: Once | RECTAL | Status: DC | PRN

## 2019-05-27 MED ORDER — ROPIVACAINE HCL 5 MG/ML IJ SOLN
INTRAMUSCULAR | Status: DC | PRN
  Administered 2019-05-27: 150 mg

## 2019-05-27 MED ORDER — CHLORHEXIDINE GLUCONATE 4 % EX LIQD: 60.0000 mL | Freq: Once | CUTANEOUS | Status: DC

## 2019-05-27 MED ORDER — METOCLOPRAMIDE HCL 10 MG PO TABS: 5.0000 mg | ORAL_TABLET | Freq: Three times a day (TID) | ORAL | Status: DC | PRN

## 2019-05-27 MED ORDER — PROPOFOL 10 MG/ML IV BOLUS
INTRAVENOUS | Status: AC
  Filled 2019-05-27: qty 40

## 2019-05-27 MED ORDER — TRANEXAMIC ACID-NACL 1000-0.7 MG/100ML-% IV SOLN
1000.0000 mg | INTRAVENOUS | Status: AC
  Administered 2019-05-27: 07:00:00 1000 mg via INTRAVENOUS
  Filled 2019-05-27: qty 100

## 2019-05-27 MED ORDER — HYDROMORPHONE HCL 1 MG/ML IJ SOLN: 0.5000 mg | INTRAMUSCULAR | Status: DC | PRN

## 2019-05-27 MED ORDER — METHOCARBAMOL 500 MG PO TABS: 500.0000 mg | ORAL_TABLET | Freq: Four times a day (QID) | ORAL | Status: DC | PRN

## 2019-05-27 MED ORDER — LACTATED RINGERS IV SOLN
INTRAVENOUS | Status: DC | PRN
  Administered 2019-05-27 (×2): via INTRAVENOUS

## 2019-05-27 MED ORDER — EPHEDRINE 5 MG/ML INJ
INTRAVENOUS | Status: AC
  Filled 2019-05-27: qty 10

## 2019-05-27 MED ORDER — ADULT MULTIVITAMIN W/MINERALS CH
1.0000 | ORAL_TABLET | Freq: Every day | ORAL | Status: DC
  Administered 2019-05-27 – 2019-05-29 (×3): 1 via ORAL
  Filled 2019-05-27 (×3): qty 1

## 2019-05-27 MED ORDER — ONDANSETRON HCL 4 MG PO TABS: 4.0000 mg | ORAL_TABLET | Freq: Four times a day (QID) | ORAL | Status: DC | PRN

## 2019-05-27 MED ORDER — 0.9 % SODIUM CHLORIDE (POUR BTL) OPTIME
TOPICAL | Status: DC | PRN
  Administered 2019-05-27: 09:00:00 1000 mL

## 2019-05-27 MED ORDER — CLINDAMYCIN PHOSPHATE 600 MG/50ML IV SOLN
600.0000 mg | Freq: Four times a day (QID) | INTRAVENOUS | Status: AC
  Administered 2019-05-27 (×2): 600 mg via INTRAVENOUS
  Filled 2019-05-27 (×2): qty 50

## 2019-05-27 MED ORDER — TRANEXAMIC ACID-NACL 1000-0.7 MG/100ML-% IV SOLN
1000.0000 mg | Freq: Once | INTRAVENOUS | Status: AC
  Administered 2019-05-27: 1000 mg via INTRAVENOUS
  Filled 2019-05-27: qty 100

## 2019-05-27 MED ORDER — ASPIRIN EC 325 MG PO TBEC
325.0000 mg | DELAYED_RELEASE_TABLET | Freq: Every day | ORAL | Status: DC
  Administered 2019-05-28 – 2019-05-29 (×2): 325 mg via ORAL
  Filled 2019-05-27 (×2): qty 1

## 2019-05-27 MED ORDER — HAIR SKIN AND NAILS FORMULA PO TABS: ORAL_TABLET | Freq: Every day | ORAL | Status: DC

## 2019-05-27 MED ORDER — BUPIVACAINE IN DEXTROSE 0.75-8.25 % IT SOLN
INTRATHECAL | Status: DC | PRN
  Administered 2019-05-27: 12 mg via INTRATHECAL

## 2019-05-27 MED ORDER — TRAMADOL HCL 50 MG PO TABS
50.0000 mg | ORAL_TABLET | Freq: Four times a day (QID) | ORAL | Status: DC
  Administered 2019-05-27 – 2019-05-29 (×6): 50 mg via ORAL
  Filled 2019-05-27 (×6): qty 1

## 2019-05-27 MED ORDER — EPINEPHRINE PF 1 MG/ML IJ SOLN
INTRAMUSCULAR | Status: AC
  Filled 2019-05-27: qty 1

## 2019-05-27 MED ORDER — DIPHENHYDRAMINE HCL 12.5 MG/5ML PO ELIX
12.5000 mg | ORAL_SOLUTION | ORAL | Status: DC | PRN
  Administered 2019-05-28: 25 mg via ORAL
  Filled 2019-05-27 (×2): qty 10

## 2019-05-27 MED ORDER — FENTANYL CITRATE (PF) 100 MCG/2ML IJ SOLN
INTRAMUSCULAR | Status: AC
  Filled 2019-05-27: qty 2

## 2019-05-27 MED ORDER — INSULIN PEN NEEDLE 32G X 4 MM MISC: 1.0000 | Freq: Two times a day (BID) | Status: DC

## 2019-05-27 MED ORDER — PHENOL 1.4 % MT LIQD: 1.0000 | OROMUCOSAL | Status: DC | PRN

## 2019-05-27 MED ORDER — CELECOXIB 400 MG PO CAPS
400.0000 mg | ORAL_CAPSULE | Freq: Once | ORAL | Status: AC
  Administered 2019-05-27: 400 mg via ORAL
  Filled 2019-05-27: qty 1

## 2019-05-27 MED ORDER — CALCIUM CARBONATE-VITAMIN D 500-200 MG-UNIT PO TABS
1.0000 | ORAL_TABLET | Freq: Every day | ORAL | Status: DC
  Administered 2019-05-27 – 2019-05-28 (×2): 1 via ORAL
  Filled 2019-05-27 (×2): qty 1

## 2019-05-27 MED ORDER — MENTHOL 3 MG MT LOZG: 1.0000 | LOZENGE | OROMUCOSAL | Status: DC | PRN

## 2019-05-27 MED ORDER — ONDANSETRON HCL 4 MG/2ML IJ SOLN: 4.0000 mg | Freq: Once | INTRAMUSCULAR | Status: DC | PRN

## 2019-05-27 MED ORDER — HYDROMORPHONE HCL 1 MG/ML IJ SOLN
0.2500 mg | INTRAMUSCULAR | Status: DC | PRN
  Administered 2019-05-27 (×3): 0.25 mg via INTRAVENOUS
  Filled 2019-05-27 (×2): qty 0.5

## 2019-05-27 MED ORDER — SODIUM CHLORIDE 0.9 % IV SOLN
INTRAVENOUS | Status: AC
  Administered 2019-05-27: 14:00:00 via INTRAVENOUS

## 2019-05-27 MED ORDER — MIDAZOLAM HCL 5 MG/5ML IJ SOLN
INTRAMUSCULAR | Status: DC | PRN
  Administered 2019-05-27 (×2): 1 mg via INTRAVENOUS
  Administered 2019-05-27: 2 mg via INTRAVENOUS

## 2019-05-27 MED ORDER — DOCUSATE SODIUM 100 MG PO CAPS
100.0000 mg | ORAL_CAPSULE | Freq: Two times a day (BID) | ORAL | Status: DC
  Administered 2019-05-27 – 2019-05-29 (×4): 100 mg via ORAL
  Filled 2019-05-27 (×4): qty 1

## 2019-05-27 MED ORDER — BUPIVACAINE-EPINEPHRINE (PF) 0.5% -1:200000 IJ SOLN
INTRAMUSCULAR | Status: AC
  Filled 2019-05-27: qty 60

## 2019-05-27 MED ORDER — OXYCODONE HCL 5 MG PO TABS
10.0000 mg | ORAL_TABLET | ORAL | Status: DC | PRN
  Administered 2019-05-27 – 2019-05-29 (×3): 10 mg via ORAL
  Filled 2019-05-27: qty 3
  Filled 2019-05-27: qty 2

## 2019-05-27 MED ORDER — BISACODYL 5 MG PO TBEC: 5.0000 mg | DELAYED_RELEASE_TABLET | Freq: Every day | ORAL | Status: DC | PRN

## 2019-05-27 MED ORDER — DEXAMETHASONE SODIUM PHOSPHATE 10 MG/ML IJ SOLN
INTRAMUSCULAR | Status: AC
  Filled 2019-05-27: qty 2

## 2019-05-27 MED ORDER — VITAMIN D 25 MCG (1000 UNIT) PO TABS
5000.0000 [IU] | ORAL_TABLET | Freq: Every day | ORAL | Status: DC
  Administered 2019-05-27 – 2019-05-28 (×2): 5000 [IU] via ORAL
  Filled 2019-05-27 (×2): qty 5

## 2019-05-27 MED ORDER — HYDROCHLOROTHIAZIDE 25 MG PO TABS
25.0000 mg | ORAL_TABLET | Freq: Every day | ORAL | Status: DC
  Administered 2019-05-27 – 2019-05-29 (×2): 25 mg via ORAL
  Filled 2019-05-27 (×3): qty 1

## 2019-05-27 MED ORDER — BUPIVACAINE IN DEXTROSE 0.75-8.25 % IT SOLN
INTRATHECAL | Status: AC
  Filled 2019-05-27: qty 2

## 2019-05-27 MED ORDER — SODIUM CHLORIDE 0.9 % IR SOLN
Status: DC | PRN
  Administered 2019-05-27: 3000 mL

## 2019-05-27 MED ORDER — POVIDONE-IODINE 10 % EX SWAB: 2.0000 "application " | Freq: Once | CUTANEOUS | Status: DC

## 2019-05-27 MED ORDER — LORATADINE 10 MG PO TABS: 10.0000 mg | ORAL_TABLET | Freq: Every day | ORAL | Status: DC | PRN

## 2019-05-27 MED ORDER — OXYCODONE HCL 5 MG PO TABS
5.0000 mg | ORAL_TABLET | Freq: Once | ORAL | Status: AC
  Administered 2019-05-27: 5 mg via ORAL
  Filled 2019-05-27: qty 1

## 2019-05-27 MED ORDER — ACETAMINOPHEN 500 MG PO TABS
1000.0000 mg | ORAL_TABLET | Freq: Four times a day (QID) | ORAL | Status: AC
  Administered 2019-05-27 – 2019-05-28 (×4): 1000 mg via ORAL
  Filled 2019-05-27 (×4): qty 2

## 2019-05-27 MED ORDER — ONDANSETRON HCL 4 MG/2ML IJ SOLN
4.0000 mg | Freq: Once | INTRAMUSCULAR | Status: AC
  Administered 2019-05-27: 4 mg via INTRAVENOUS
  Filled 2019-05-27: qty 2

## 2019-05-27 MED ORDER — PRAMIPEXOLE DIHYDROCHLORIDE 0.25 MG PO TABS
0.2500 mg | ORAL_TABLET | Freq: Every day | ORAL | Status: DC
  Administered 2019-05-27 – 2019-05-28 (×2): 0.25 mg via ORAL
  Filled 2019-05-27 (×3): qty 1

## 2019-05-27 MED ORDER — OXYCODONE HCL 5 MG PO TABS
5.0000 mg | ORAL_TABLET | ORAL | Status: DC | PRN
  Administered 2019-05-28: 17:00:00 10 mg via ORAL
  Administered 2019-05-28: 21:00:00 5 mg via ORAL
  Filled 2019-05-27 (×3): qty 2

## 2019-05-27 MED ORDER — LIRAGLUTIDE -WEIGHT MANAGEMENT 18 MG/3ML ~~LOC~~ SOPN: 3.0000 mg | PEN_INJECTOR | Freq: Every day | SUBCUTANEOUS | Status: DC

## 2019-05-27 MED ORDER — ROPIVACAINE HCL 5 MG/ML IJ SOLN
INTRAMUSCULAR | Status: AC
  Filled 2019-05-27: qty 30

## 2019-05-27 MED ORDER — FENTANYL CITRATE (PF) 100 MCG/2ML IJ SOLN
INTRAMUSCULAR | Status: DC | PRN
  Administered 2019-05-27: 25 ug via INTRAVENOUS
  Administered 2019-05-27: 50 ug via INTRAVENOUS

## 2019-05-27 MED ORDER — SENNOSIDES-DOCUSATE SODIUM 8.6-50 MG PO TABS: 1.0000 | ORAL_TABLET | Freq: Every evening | ORAL | Status: DC | PRN

## 2019-05-27 MED ORDER — PROPOFOL 500 MG/50ML IV EMUL
INTRAVENOUS | Status: DC | PRN
  Administered 2019-05-27: 09:00:00 via INTRAVENOUS
  Administered 2019-05-27: 40 ug/kg/min via INTRAVENOUS
  Administered 2019-05-27 (×3): via INTRAVENOUS

## 2019-05-27 MED ORDER — CLINDAMYCIN PHOSPHATE 900 MG/50ML IV SOLN
900.0000 mg | INTRAVENOUS | Status: AC
  Administered 2019-05-27: 900 mg via INTRAVENOUS
  Filled 2019-05-27: qty 50

## 2019-05-27 MED ORDER — MEPERIDINE HCL 50 MG/ML IJ SOLN: 6.2500 mg | INTRAMUSCULAR | Status: DC | PRN

## 2019-05-27 MED ORDER — BUPIVACAINE LIPOSOME 1.3 % IJ SUSP
INTRAMUSCULAR | Status: DC | PRN
  Administered 2019-05-27: 20 mL

## 2019-05-27 MED ORDER — METHOCARBAMOL 1000 MG/10ML IJ SOLN
500.0000 mg | Freq: Once | INTRAVENOUS | Status: AC
  Administered 2019-05-27: 500 mg via INTRAVENOUS
  Filled 2019-05-27: qty 5

## 2019-05-27 MED ORDER — PROPOFOL 10 MG/ML IV BOLUS
INTRAVENOUS | Status: AC
  Filled 2019-05-27: qty 20

## 2019-05-27 MED ORDER — LIDOCAINE HCL (PF) 1 % IJ SOLN
INTRAMUSCULAR | Status: AC
  Filled 2019-05-27: qty 2

## 2019-05-27 MED ORDER — DEXAMETHASONE SODIUM PHOSPHATE 10 MG/ML IJ SOLN
INTRAMUSCULAR | Status: DC | PRN
  Administered 2019-05-27: 10 mg
  Administered 2019-05-27: 5 mg via INTRAVENOUS

## 2019-05-27 MED ORDER — METOCLOPRAMIDE HCL 5 MG/ML IJ SOLN: 5.0000 mg | Freq: Three times a day (TID) | INTRAMUSCULAR | Status: DC | PRN

## 2019-05-27 MED ORDER — CELECOXIB 100 MG PO CAPS
200.0000 mg | ORAL_CAPSULE | Freq: Two times a day (BID) | ORAL | Status: DC
  Administered 2019-05-27 – 2019-05-29 (×4): 200 mg via ORAL
  Filled 2019-05-27 (×4): qty 2

## 2019-05-27 SURGICAL SUPPLY — 70 items
BANDAGE ELASTIC 6 LF NS (GAUZE/BANDAGES/DRESSINGS) ×3 IMPLANT
BANDAGE ESMARK 6X9 LF (GAUZE/BANDAGES/DRESSINGS) ×1 IMPLANT
BIT DRILL 3.2X128 (BIT) ×2 IMPLANT
BIT DRILL 3.2X128MM (BIT) ×1
BLADE HEX COATED 2.75 (ELECTRODE) ×3 IMPLANT
BLADE SAGITTAL 25.0X1.27X90 (BLADE) ×2 IMPLANT
BLADE SAGITTAL 25.0X1.27X90MM (BLADE) ×1
BLADE SAW SAG 90X13X1.27 (BLADE) ×3 IMPLANT
BNDG ELASTIC 4X5.8 VLCR STR LF (GAUZE/BANDAGES/DRESSINGS) ×6 IMPLANT
BNDG ESMARK 6X9 LF (GAUZE/BANDAGES/DRESSINGS) ×3
CEMENT HV SMART SET (Cement) ×6 IMPLANT
CLOTH BEACON ORANGE TIMEOUT ST (SAFETY) ×3 IMPLANT
COOLER CRYO CUFF IC AND MOTOR (MISCELLANEOUS) ×3 IMPLANT
COVER LIGHT HANDLE STERIS (MISCELLANEOUS) ×6 IMPLANT
COVER WAND RF STERILE (DRAPES) ×3 IMPLANT
CUFF CRYO KNEE LG 20X31 COOLER (ORTHOPEDIC SUPPLIES) ×3 IMPLANT
CUFF TOURN SGL QUICK 42 (TOURNIQUET CUFF) ×3 IMPLANT
DECANTER SPIKE VIAL GLASS SM (MISCELLANEOUS) ×6 IMPLANT
DRAPE BACK TABLE (DRAPES) ×3 IMPLANT
DRAPE EXTREMITY T 121X128X90 (DISPOSABLE) ×3 IMPLANT
DRSG MEPILEX BORDER 4X12 (GAUZE/BANDAGES/DRESSINGS) ×3 IMPLANT
DURAPREP 26ML APPLICATOR (WOUND CARE) ×6 IMPLANT
ELECT REM PT RETURN 9FT ADLT (ELECTROSURGICAL) ×3
ELECTRODE REM PT RTRN 9FT ADLT (ELECTROSURGICAL) ×1 IMPLANT
FEMUR CMT SIGMA LFT SZ 3 (Orthopedic Implant) ×3 IMPLANT
FEMUR CMTD SIGMA LFT SZ 3 (Orthopedic Implant) ×1 IMPLANT
GLOVE BIO SURGEON STRL SZ7 (GLOVE) ×6 IMPLANT
GLOVE BIOGEL PI IND STRL 7.0 (GLOVE) ×4 IMPLANT
GLOVE BIOGEL PI INDICATOR 7.0 (GLOVE) ×8
GLOVE SKINSENSE NS SZ8.0 LF (GLOVE) ×4
GLOVE SKINSENSE STRL SZ8.0 LF (GLOVE) ×2 IMPLANT
GLOVE SS N UNI LF 8.5 STRL (GLOVE) ×3 IMPLANT
GOWN STRL REUS W/ TWL LRG LVL3 (GOWN DISPOSABLE) ×1 IMPLANT
GOWN STRL REUS W/TWL LRG LVL3 (GOWN DISPOSABLE) ×5 IMPLANT
GOWN STRL REUS W/TWL XL LVL3 (GOWN DISPOSABLE) ×3 IMPLANT
HANDPIECE INTERPULSE COAX TIP (DISPOSABLE) ×2
HOOD W/PEELAWAY (MISCELLANEOUS) ×9 IMPLANT
INSERT 2.5X10MM (Insert) ×3 IMPLANT
INST SET MAJOR BONE (KITS) ×6 IMPLANT
IV NS IRRIG 3000ML ARTHROMATIC (IV SOLUTION) ×3 IMPLANT
KIT BLADEGUARD II DBL (SET/KITS/TRAYS/PACK) ×3 IMPLANT
KIT TURNOVER CYSTO (KITS) ×3 IMPLANT
MANIFOLD NEPTUNE II (INSTRUMENTS) ×3 IMPLANT
MARKER SKIN DUAL TIP RULER LAB (MISCELLANEOUS) ×3 IMPLANT
NEEDLE HYPO 21X1.5 SAFETY (NEEDLE) ×3 IMPLANT
NS IRRIG 1000ML POUR BTL (IV SOLUTION) ×3 IMPLANT
PACK TOTAL JOINT (CUSTOM PROCEDURE TRAY) ×3 IMPLANT
PAD ARMBOARD 7.5X6 YLW CONV (MISCELLANEOUS) ×3 IMPLANT
PAD DANNIFLEX CPM (ORTHOPEDIC SUPPLIES) ×3 IMPLANT
PATELLA DOME PFC 35MM (Knees) ×3 IMPLANT
PILLOW KNEE EXTENSION 0 DEG (MISCELLANEOUS) ×3 IMPLANT
PIN THREADED HEADED SIGMA (PIN) ×3 IMPLANT
PIN/DRILL PACK ORTHO 1/8X3.0 (PIN) ×3 IMPLANT
SAW OSC TIP CART 19.5X105X1.3 (SAW) ×3 IMPLANT
SET BASIN LINEN APH (SET/KITS/TRAYS/PACK) ×3 IMPLANT
SET HNDPC FAN SPRY TIP SCT (DISPOSABLE) ×1 IMPLANT
STAPLER VISISTAT 35W (STAPLE) ×3 IMPLANT
SUT BRALON NAB BRD #1 30IN (SUTURE) ×6 IMPLANT
SUT MNCRL 0 VIOLET CTX 36 (SUTURE) IMPLANT
SUT MON AB 0 CT1 (SUTURE) IMPLANT
SUT MON AB 2-0 CT1 36 (SUTURE) ×6 IMPLANT
SUT MONOCRYL 0 CTX 36 (SUTURE)
SYR BULB IRRIGATION 50ML (SYRINGE) ×3 IMPLANT
SYRINGE 20CC LL (MISCELLANEOUS) ×15 IMPLANT
TOWEL OR 17X26 4PK STRL BLUE (TOWEL DISPOSABLE) ×3 IMPLANT
TOWER CARTRIDGE SMART MIX (DISPOSABLE) ×3 IMPLANT
TRAY FOLEY MTR SLVR 16FR STAT (SET/KITS/TRAYS/PACK) ×3 IMPLANT
TRAY TIB MOD SZ 2.5 (Knees) ×3 IMPLANT
WATER STERILE IRR 1000ML POUR (IV SOLUTION) ×6 IMPLANT
YANKAUER SUCT 12FT TUBE ARGYLE (SUCTIONS) ×3 IMPLANT

## 2019-05-27 NOTE — Evaluation (Signed)
Physical Therapy Evaluation Patient Details Name: Amy Mooney MRN: [id number] DOB: 09-06-1966 Today's Date: 05/27/2019  LEFT KNEE ROM: 0 - 100 degrees AMBULATION DISTANCE: 120 feet using RW with Supervision    History of Present Illness  Amy Mooney is a 53 y/o female, s/p Left TKA, 05/27/19 with the diagnosis of left knee OA  Clinical Impression  Patient reviewed in HEP with good return demonstrated, able to achieve up to 100 degrees left knee flexion during self stretching while seated at bedside, good tolerance for ambulation in hallway without c/o left knee pain and tolerated sitting up in chair after therapy - RN notified.  Patient will benefit from continued physical therapy in hospital and recommended venue below to increase strength, balance, endurance for safe ADLs and gait.    Follow Up Recommendations Home health PT;Supervision - Intermittent    Equipment Recommendations  None recommended by PT    Recommendations for Other Services       Precautions / Restrictions Precautions Precautions: Fall Precaution Comments: s/p Left TKA Restrictions Weight Bearing Restrictions: Yes LLE Weight Bearing: Weight bearing as tolerated      Mobility  Bed Mobility Overal bed mobility: Modified Independent             General bed mobility comments: inceased time, labored movement  Transfers Overall transfer level: Needs assistance Equipment used: Rolling walker (2 wheeled) Transfers: Sit to/from [facility] Sit to Stand: Supervision Stand pivot transfers: Supervision       General transfer comment: slightly labored movement, increased time  Ambulation/Gait Ambulation/Gait assistance: Supervision Gait Distance (Feet): 120 Feet Assistive device: Rolling walker (2 wheeled) Gait Pattern/deviations: Step-to pattern;Decreased step length - left;Decreased stride length Gait velocity: decreased   General Gait Details: slightly labored cadence with  fair/good return for left heel to toe stepping, no loss of balance, no c/o pain left knee  Stairs            Wheelchair Mobility    Modified Rankin (Stroke Patients Only)       Balance Overall balance assessment: Needs assistance Sitting-balance support: Feet supported;No upper extremity supported Sitting balance-Leahy Scale: Good Sitting balance - Comments: seated at beddside   Standing balance support: During functional activity;Bilateral upper extremity supported Standing balance-Leahy Scale: Fair Standing balance comment: fair/good using RW                             Pertinent Vitals/Pain Pain Assessment: No/denies pain    Home Living Family/patient expects to be discharged to:: Private residence Living Arrangements: Spouse/significant other Available Help at Discharge: Family Type of Home: House Home Access: Ramped entrance     Home Layout: One level Home Equipment: Environmental consultant - 2 wheels;Bedside commode;Shower seat      Prior Function Level of Independence: Independent         Comments: Hydrographic surveyor, drives     Journalist, newspaper        Extremity/Trunk Assessment   Upper Extremity Assessment Upper Extremity Assessment: Overall WFL for tasks assessed    Lower Extremity Assessment Lower Extremity Assessment: Overall WFL for tasks assessed;LLE deficits/detail LLE Deficits / Details: grossly -4/5    Cervical / Trunk Assessment Cervical / Trunk Assessment: Normal  Communication   Communication: No difficulties  Cognition Arousal/Alertness: Awake/alert Behavior During Therapy: WFL for tasks assessed/performed Overall Cognitive Status: Within Functional Limits for tasks assessed  General Comments      Exercises Total Joint Exercises Ankle Circles/Pumps: Supine;AROM;Strengthening;Left;5 reps Quad Sets: AROM;Strengthening;Left;5 reps;Supine Heel Slides:  AROM;Strengthening;Left;5 reps;Supine Knee Flexion: Seated;AAROM(selft stretching x 30 second hold) Goniometric ROM: left knee 0-100 degrees   Assessment/Plan    PT Assessment Patient needs continued PT services  PT Problem List Decreased strength;Decreased range of motion;Decreased activity tolerance;Decreased balance;Decreased mobility       PT Treatment Interventions Gait training;Stair training;Functional mobility training;Therapeutic activities;Therapeutic exercise;Patient/family education    PT Goals (Current goals can be found in the Care Plan section)  Acute Rehab PT Goals Patient Stated Goal: return home with family to assist PT Goal Formulation: With patient Time For Goal Achievement: 05/30/19 Potential to Achieve Goals: Good    Frequency 7X/week   Barriers to discharge        Co-evaluation               AM-PAC PT "6 Clicks" Mobility  Outcome Measure Help needed turning from your back to your side while in a flat bed without using bedrails?: None Help needed moving from lying on your back to sitting on the side of a flat bed without using bedrails?: None Help needed moving to and from a bed to a chair (including a wheelchair)?: A Little Help needed standing up from a chair using your arms (e.g., wheelchair or bedside chair)?: A Little Help needed to walk in hospital room?: A Little Help needed climbing 3-5 steps with a railing? : A Little 6 Click Score: 20    End of Session   Activity Tolerance: Patient tolerated treatment well;Patient limited by fatigue Patient left: in chair;with call bell/phone within reach Nurse Communication: Mobility status PT Visit Diagnosis: Unsteadiness on feet (R26.81);Other abnormalities of gait and mobility (R26.89);Muscle weakness (generalized) (M62.81)    Time: 2563-8937 PT Time Calculation (min) (ACUTE ONLY): 31 min   Charges:   PT Evaluation $PT Eval Moderate Complexity: 1 Mod PT Treatments $Therapeutic Activity:  23-37 mins        3:57 PM, 05/27/19 Lonell Grandchild, MPT Physical Therapist with [facility] [phone] office [phone] mobile phone

## 2019-05-27 NOTE — Addendum Note (Signed)
Addendum  created 05/27/19 1206 by Charmaine Downs, CRNA   Charge Capture section accepted

## 2019-05-27 NOTE — Anesthesia Postprocedure Evaluation (Signed)
Anesthesia Post Note  Patient: Amy Mooney  Procedure(s) Performed: TOTAL KNEE ARTHROPLASTY (Left Knee)  Patient location during evaluation: PACU Anesthesia Type: Combined General/Spinal Level of consciousness: awake, oriented and patient cooperative Pain management: pain level controlled Vital Signs Assessment: post-procedure vital signs reviewed and stable Respiratory status: spontaneous breathing, respiratory function stable and nonlabored ventilation Cardiovascular status: blood pressure returned to baseline Postop Assessment: no headache and no apparent nausea or vomiting Anesthetic complications: no     Last Vitals:  Vitals:   05/27/19 0700 05/27/19 1030  BP: 137/84   Temp: 36.7 C (!) 36.4 C  SpO2: 98% 96%    Last Pain:  Vitals:   05/27/19 0706  TempSrc:   PainSc: 0-No pain                 [name] J

## 2019-05-27 NOTE — Anesthesia Preprocedure Evaluation (Signed)
Anesthesia Evaluation  Patient identified by MRN, date of birth, ID band Patient awake    Reviewed: Allergy & Precautions, NPO status , Patient's Chart, lab work & pertinent test results, reviewed documented beta blocker date and time   History of Anesthesia Complications Negative for: history of anesthetic complications  Airway Mallampati: II  TM Distance: >3 FB Neck ROM: Full    Dental  (+) Missing,    Pulmonary sleep apnea , former smoker,    breath sounds clear to auscultation       Cardiovascular hypertension, Pt. on medications  Rhythm:Regular Rate:Normal     Neuro/Psych  Headaches,    GI/Hepatic negative GI ROS, Neg liver ROS,   Endo/Other  Morbid obesity  Renal/GU negative Renal ROS     Musculoskeletal  (+) Arthritis ,   Abdominal (+) + obese,   Peds  Hematology  (+) anemia ,   Anesthesia Other Findings   Reproductive/Obstetrics                             Anesthesia Physical Anesthesia Plan  ASA: III  Anesthesia Plan: General/Spinal   Post-op Pain Management:    Induction:   PONV Risk Score and Plan:   Airway Management Planned: Natural Airway and Simple Face Mask  Additional Equipment:   Intra-op Plan:   Post-operative Plan:   Informed Consent: I have reviewed the patients History and Physical, chart, labs and discussed the procedure including the risks, benefits and alternatives for the proposed anesthesia with the patient or authorized representative who has indicated his/her understanding and acceptance.     Dental advisory given  Plan Discussed with: CRNA  Anesthesia Plan Comments:         Anesthesia Quick Evaluation

## 2019-05-27 NOTE — Plan of Care (Signed)
  Problem: Acute Rehab PT Goals(only PT should resolve) Goal: Pt Will Go Supine/Side To Sit Outcome: Progressing Flowsheets (Taken 05/27/2019 1600) Pt will go Supine/Side to Sit: Independently Goal: Patient Will Transfer Sit To/From Stand Outcome: Progressing Flowsheets (Taken 05/27/2019 1600) Patient will transfer sit to/from stand: with modified independence Goal: Pt Will Transfer Bed To Chair/Chair To Bed Outcome: Progressing Flowsheets (Taken 05/27/2019 1600) Pt will Transfer Bed to Chair/Chair to Bed: with modified independence Goal: Pt Will Ambulate Outcome: Progressing Flowsheets (Taken 05/27/2019 1600) Pt will Ambulate:  > 125 feet  with modified independence  with rolling walker   4:00 PM, 05/27/19 Lonell Grandchild, MPT Physical Therapist with [facility] [phone] office [phone] mobile phone

## 2019-05-27 NOTE — Anesthesia Procedure Notes (Signed)
Spinal  Patient location during procedure: OR Start time: 05/27/2019 7:55 AM End time: 05/27/2019 8:08 AM Staffing Anesthesiologist: Denese Killings, MD Resident/CRNA: Charmaine Downs, CRNA Performed: anesthesiologist and resident/CRNA  Preanesthetic Checklist Completed: patient identified, surgical consent, pre-op evaluation, timeout performed, IV checked, risks and benefits discussed and monitors and equipment checked Spinal Block Patient position: sitting Prep: Betadine Patient monitoring: heart rate, cardiac monitor, continuous pulse ox and blood pressure Approach: midline Location: L4-5 Injection technique: single-shot Needle Needle type: Spinocan  Needle gauge: 22 G Needle insertion depth: 8 cm Additional Notes Attempted spinal in lateral position and patient was unable to cooperate.  Did spinal in sitting position, AttemptsX1, clear csf identified, bupivacaine 1.6 mg with epi wash was given, and needle was removed, patient tolerated the procedure.

## 2019-05-27 NOTE — Progress Notes (Signed)
During transport to the pt's room, she had a difficult time tolerating the bone foam to the left leg. She did share her dislike for the bone foam during her previous surgery on the RLE. Prior to transport pain level was a 0/10. I gave her pain med for the transport for comfort during movement. After transport she stated to Unit nurse pain was a 10/10. Pt said "everything waking up".

## 2019-05-27 NOTE — Interval H&P Note (Signed)
History and Physical Interval Note:  05/27/2019 7:25 AM  Amy Mooney  has presented today for surgery, with the diagnosis of left total knee replacment.  The various methods of treatment have been discussed with the patient and family. After consideration of risks, benefits and other options for treatment, the patient has consented to  Procedure(s): TOTAL KNEE ARTHROPLASTY (Left) as a surgical intervention.  The patient's history has been reviewed, patient examined, no change in status, stable for surgery.  I have reviewed the patient's chart and labs.  Questions were answered to the patient's satisfaction.     Arther Abbott

## 2019-05-27 NOTE — Transfer of Care (Signed)
Immediate Anesthesia Transfer of Care Note  Patient: Amy Mooney  Procedure(s) Performed: TOTAL KNEE ARTHROPLASTY (Left Knee)  Patient Location: PACU  Anesthesia Type:Spinal  Level of Consciousness: awake and patient cooperative  Airway & Oxygen Therapy: Patient Spontanous Breathing and Patient connected to nasal cannula oxygen  Post-op Assessment: Report given to RN and Post -op Vital signs reviewed and stable  Post vital signs: Reviewed and stable  Last Vitals:  Vitals Value Taken Time  BP    Temp    Pulse    Resp    SpO2      Last Pain:  Vitals:   05/27/19 0706  TempSrc:   PainSc: 0-No pain      Patients Stated Pain Goal: 8 (09/38/18 2993)  Complications: No apparent anesthesia complications

## 2019-05-27 NOTE — Brief Op Note (Signed)
05/27/2019  10:25 AM  PATIENT:  Amy Mooney  53 y.o. female  PRE-OPERATIVE DIAGNOSIS:  left knee osteoarthritis  POST-OPERATIVE DIAGNOSIS:  left knee osteoarthritis  PROCEDURE:  Procedure(s): TOTAL KNEE ARTHROPLASTY (Left)   Findings moderate varus deformity.  Grade 4 changes of the medial femoral condyle medial tibial plateau osteophytes surrounding the tibia and femur hand posterior osteophytes medial and lateral femur.  Implants: depuy johnson and johnson: Sigma fixed bearing posterior stabilized size 3 femur size 2.5 tibia size 38 x 8.5 patella 10 polyethylene posterior stabilized insert  SURGEON:  Surgeon(s) and Role:    * Carole Civil, MD - Primary  PHYSICIAN ASSISTANT:   ASSISTANTS: Fulton Mole  ANESTHESIA:    Abductor canal block was done as well as spinal anesthetic  EBL:  40 mL   BLOOD ADMINISTERED:none  DRAINS: none   LOCAL MEDICATIONS USED:  MARCAINE   , Amount: 30 cc with epi ml and OTHER exparel 20 undiluted  SPECIMEN:  No Specimen  DISPOSITION OF SPECIMEN:  N/A  COUNTS:  YES  TOURNIQUET:   Total Tourniquet Time Documented: Thigh (Left) - 100 minutes Total: Thigh (Left) - 100 minutes   DICTATION: .Dragon Dictation  PLAN OF CARE: Admit to inpatient   PATIENT DISPOSITION:  PACU - hemodynamically stable.   Delay start of Pharmacological VTE agent (>24hrs) due to surgical blood loss or risk of bleeding: yes

## 2019-05-27 NOTE — Op Note (Signed)
05/27/2019  10:25 AM  PATIENT:  Amy Mooney  53 y.o. female  PRE-OPERATIVE DIAGNOSIS:  left knee osteoarthritis  POST-OPERATIVE DIAGNOSIS:  left knee osteoarthritis  PROCEDURE:  Procedure(s): TOTAL KNEE ARTHROPLASTY (Left)   Findings moderate varus deformity.  Grade 4 changes of the medial femoral condyle medial tibial plateau osteophytes surrounding the tibia and femur hand posterior osteophytes medial and lateral femur.  Implants: depuy johnson and johnson: Sigma fixed bearing posterior stabilized size 3 femur size 2.5 tibia size 38 x 8.5 patella 10 polyethylene posterior stabilized insert  SURGEON:  Surgeon(s) and Role:    Aline Brochure, Tim Lair, MD - Primary  We did the surgery and the following manner:  The patient was seen in preop surgical site was confirmed chart review completed surgical site was marked left knee  Patient was taken to the operating room for spinal anesthesia after an abductor canal block was done by anesthesia.  She was placed in the supine position and a tourniquet was placed on her left thigh.  After sterile prep and drape timeout was completed.  Implants were available radiographs were visible surgical team agreed with the surgical site and procedure and patient  The leg was exsanguinated with elevation and a 6 inch Esmarch the tourniquet was increased to 300 mmHg where it stayed for 100 minutes  A straight longitudinal incision was made subcutaneous tissue was then divided.  The extensor mechanism was identified and a medial arthrotomy was performed elevating the soft tissue sleeve of the deep collateral medial ligament and excising the fat pad.  The patient had extensive scarring of the patellar tendon to the proximal tibia.  We encountered multiple osteophytes as described in the findings  They were removed  The menisci were removed along with the anterior and posterior cruciate ligaments.  The femur was further exposed and a drill was placed in the  center of the femur just medial to the PCL insertion and drilled into the femoral canal which was decompressed with a fluted guide rod.  The distal resection was set for 10 mm distal resection with the left knee and 5 degree angle.  After placing the retractors the distal femur was removed with an oscillating saw  We then sized the femur to a size 3 and made the anterior posterior and chamfer cuts and then cleaned out the posterior bone with an osteotome and a rondure  I next turned my attention to the tibia was subluxated forward with a curved Hohmann and then the remaining portions of the menisci were removed and any soft tissue including remnants of the PCL and remnants of the deep portion of the MCL.  I set the tibial resection for 10 mm from the higher medial side using the tibial spine medial third of the tibial tubercle medial and lateral malleolusas landmarks.  We set the cut for a neutral varus valgus cut  Oscillating saw was used to remove the proximal tibia.  We then used spacer blocks with a 10 insert this gave excellent stability in flexion and extension with the knee coming to full extension.  We then cut the femur with a size 3 notch cutting device.  We then did a trial reduction with a 3 femur 2.5 tibia and a 10 polyethylene insert.  I was satisfied with the patella tracking and range of motion  We next placed the proximal tibial preparation guide and did a tibial punch.  We next took the patella and started with a 22 patella cut  it down to a 12 this did require to cut swelling with the guide him on freehand  We then used a 35 x 8.5 patella to restore patellar thickness to 20.5.  After thorough irrigation the components were cemented into place.  The cement was allowed to cure and excess cement was removed.  The 10 mm insert was placed.  I was satisfied with the range of motion passive flexion was 105 degrees full extension was obtained patella tracked normally  The wound was  thoroughly irrigated and closure was performed with #1 Braylon suture for the extensor mechanism and 0 Monocryl in 2 layers for the subcutaneous tissue.  Skin edges reapproximated with staples  Tourniquet was released and a sterile bandage was applied along with Ace wraps and Cryo/Cuff which was activated  She was taken to the recovery room in stable condition anticipating a normal routine postop course  PHYSICIAN ASSISTANT:   ASSISTANTS: [place] Mole  ANESTHESIA:    Abductor canal block was done as well as spinal anesthetic  EBL:  40 mL   BLOOD ADMINISTERED:none  DRAINS: none   LOCAL MEDICATIONS USED:  MARCAINE   , Amount: 30 cc with epi ml and OTHER exparel 20 undiluted  SPECIMEN:  No Specimen  DISPOSITION OF SPECIMEN:  N/A  COUNTS:  YES  TOURNIQUET:   Total Tourniquet Time Documented: Thigh (Left) - 100 minutes Total: Thigh (Left) - 100 minutes   DICTATION: .Dragon Dictation  PLAN OF CARE: Admit to inpatient   PATIENT DISPOSITION:  PACU - hemodynamically stable.   Delay start of Pharmacological VTE agent (>24hrs) due to surgical blood loss or risk of bleeding: yes

## 2019-05-27 NOTE — Anesthesia Procedure Notes (Signed)
Anesthesia Regional Block: Adductor canal block (Ropivacaine 0.5% with dexamethasone 10 mg)   Pre-Anesthetic Checklist: ,, timeout performed, Correct Patient, Correct Site, Correct Laterality, Correct Procedure, Correct Position, site marked, Risks and benefits discussed,  Surgical consent,  Pre-op evaluation,  At surgeon's request and post-op pain management  Laterality: Left  Prep: chloraprep       Needles:  Injection technique: Single-shot  Needle Type: Stimiplex      Needle Gauge: 20     Additional Needles:   (ultrasound used)  Narrative:  Start time: 05/27/2019 7:40 AM End time: 05/27/2019 7:48 AM Injection made incrementally with aspirations every 30 mL.

## 2019-05-28 ENCOUNTER — Encounter (HOSPITAL_COMMUNITY): Payer: Self-pay | Admitting: Orthopedic Surgery

## 2019-05-28 LAB — CBC
HCT: 35.9 % — ABNORMAL LOW (ref 36.0–46.0)
Hemoglobin: 11.4 g/dL — ABNORMAL LOW (ref 12.0–15.0)
MCH: 26.6 pg (ref 26.0–34.0)
MCHC: 31.8 g/dL (ref 30.0–36.0)
MCV: 83.9 fL (ref 80.0–100.0)
Platelets: 223 10*3/uL (ref 150–400)
RBC: 4.28 MIL/uL (ref 3.87–5.11)
RDW: 14.8 % (ref 11.5–15.5)
WBC: 13.2 10*3/uL — ABNORMAL HIGH (ref 4.0–10.5)
nRBC: 0 % (ref 0.0–0.2)

## 2019-05-28 LAB — BASIC METABOLIC PANEL
Anion gap: 11 (ref 5–15)
BUN: 14 mg/dL (ref 6–20)
CO2: 27 mmol/L (ref 22–32)
Calcium: 9 mg/dL (ref 8.9–10.3)
Chloride: 101 mmol/L (ref 98–111)
Creatinine, Ser: 0.71 mg/dL (ref 0.44–1.00)
GFR calc Af Amer: >60 mL/min (ref >60)
GFR calc non Af Amer: >60 mL/min (ref >60)
Glucose, Bld: 160 mg/dL — ABNORMAL HIGH (ref 70–99)
Potassium: 3.4 mmol/L — ABNORMAL LOW (ref 3.5–5.1)
Sodium: 139 mmol/L (ref 135–145)

## 2019-05-28 LAB — GLUCOSE, CAPILLARY
Glucose-Capillary: 136 mg/dL — ABNORMAL HIGH (ref 70–99)
Glucose-Capillary: 123 mg/dL — ABNORMAL HIGH (ref 70–99)

## 2019-05-28 NOTE — Addendum Note (Signed)
Addendum  created 05/28/19 1526 by Mickel Baas, CRNA   Clinical Note Signed

## 2019-05-28 NOTE — Progress Notes (Addendum)
Physical Therapy Treatment Patient Details Name: Amy Mooney MRN: [id number] DOB: [birth year] Today's Date: 05/28/2019  LEFT KNEE ROM: 0-95 degrees AMBULATION DISTANCE: 130 feet using RW with  Mod Indep/supervision    History of Present Illness Amy Mooney is a 53 y/o female, s/p Left TKA, 05/27/19 with the diagnosis of left knee OA    PT Comments    Patient presents with bloody drainage from surgery site - RN/MD aware, demonstrates good return for completing exercises, but very painful when self stretching left knee while seated at bedside (became labile), but able to ambulate in hallway without loss of balance with fair/good return for left heel to toe stepping.  Patient tolerated sitting up in chair with LLE extended after therapy.  Stair training not attempted today due to increased bleeding and c/o severe pain.  Patient will benefit from continued physical therapy in hospital and recommended venue below to increase strength, balance, endurance for safe ADLs and gait.  In PM patient tolerated CPM set at 0-80 degrees with c/o slightly increased pain above baseline.  RN notified for pain medication.   Follow Up Recommendations  Home health PT;Supervision - Intermittent     Equipment Recommendations  None recommended by PT    Recommendations for Other Services       Precautions / Restrictions Precautions Precautions: Fall Precaution Comments: s/p Left TKA Restrictions Weight Bearing Restrictions: Yes LLE Weight Bearing: Weight bearing as tolerated    Mobility  Bed Mobility Overal bed mobility: Modified Independent Bed Mobility: Supine to Sit           General bed mobility comments: increased time, slightly labored movement of LLE  Transfers Overall transfer level: Needs assistance Equipment used: Rolling walker (2 wheeled) Transfers: Sit to/from [facility] Sit to Stand: Supervision Stand pivot transfers: Supervision       General transfer  comment: mild difficulty for sit to stands due to increased left knee pain  Ambulation/Gait Ambulation/Gait assistance: Supervision;Modified independent (Device/Increase time) Gait Distance (Feet): 130 Feet Assistive device: Rolling walker (2 wheeled) Gait Pattern/deviations: Decreased step length - left;Decreased stance time - left;Decreased stride length Gait velocity: decreased   General Gait Details: increased endurance/distance for ambulation with fair/good return for left heel to toe stepping without loss of balance, mostly limited due to c/o fatigue and left knee pain   Stairs             Wheelchair Mobility    Modified Rankin (Stroke Patients Only)       Balance Overall balance assessment: Needs assistance Sitting-balance support: Feet supported;No upper extremity supported Sitting balance-Leahy Scale: Good Sitting balance - Comments: seated at bedside   Standing balance support: During functional activity;Bilateral upper extremity supported Standing balance-Leahy Scale: Fair Standing balance comment: fair/good using RW                            Cognition Arousal/Alertness: Awake/alert Behavior During Therapy: WFL for tasks assessed/performed Overall Cognitive Status: Within Functional Limits for tasks assessed                                        Exercises Total Joint Exercises Ankle Circles/Pumps: Supine;AROM;Strengthening;Left;10 reps Quad Sets: AROM;Strengthening;Left;Supine;10 reps Short Arc Quad: Supine;AROM;Strengthening;Left;10 reps Heel Slides: AROM;Strengthening;Left;10 reps;Supine    General Comments        Pertinent Vitals/Pain Pain Assessment: Faces  Faces Pain Scale: Hurts even more Pain Location: left knee, mostly in anterior thigh area Pain Descriptors / Indicators: Sore;Grimacing;Guarding;Crying Pain Intervention(s): Limited activity within patient's tolerance;Monitored during session;Premedicated  before session    Home Living                      Prior Function            PT Goals (current goals can now be found in the care plan section) Acute Rehab PT Goals Patient Stated Goal: return home with family to assist PT Goal Formulation: With patient Time For Goal Achievement: 05/30/19 Potential to Achieve Goals: Good Progress towards PT goals: Progressing toward goals    Frequency    7X/week      PT Plan Current plan remains appropriate    Co-evaluation              AM-PAC PT "6 Clicks" Mobility   Outcome Measure  Help needed turning from your back to your side while in a flat bed without using bedrails?: None Help needed moving from lying on your back to sitting on the side of a flat bed without using bedrails?: None Help needed moving to and from a bed to a chair (including a wheelchair)?: A Little Help needed standing up from a chair using your arms (e.g., wheelchair or bedside chair)?: A Little Help needed to walk in hospital room?: A Little Help needed climbing 3-5 steps with a railing? : A Little 6 Click Score: 20    End of Session   Activity Tolerance: Patient tolerated treatment well;Patient limited by fatigue;Patient limited by pain Patient left: in chair;with call bell/phone within reach Nurse Communication: Mobility status PT Visit Diagnosis: Unsteadiness on feet (R26.81);Other abnormalities of gait and mobility (R26.89);Muscle weakness (generalized) (M62.81)     Time: 3545-6256 PT Time Calculation (min) (ACUTE ONLY): 31 min  Charges:  $Gait Training: 8-22 mins $Therapeutic Exercise: 8-22 mins                     10:05 AM, 05/28/19 Lonell Grandchild, MPT Physical Therapist with [facility] [phone] office [phone] mobile phone

## 2019-05-28 NOTE — Progress Notes (Addendum)
Patient ID: Amy Mooney, female   DOB: 03/11/1966, 53 y.o.   MRN: [id number] This is postop day 1 status post left total knee  BP 105/72 (BP Location: Right Arm)   Pulse 67   Temp 98.2 F (36.8 C) (Oral)   Resp 16   Ht 5\' 3"  (1.6 m)   Wt 104.3 kg   SpO2 99%   BMI 40.73 kg/m    Patient had some bloody drainage from the dressing which was reinforced and seems to be under control  Patient has good pain control  Ambulation 120 feet range of motion 0 to 100 degrees tolerated bone foam overnight with good extension  Recommend continue physical therapy try to discharge tomorrow  CBC Latest Ref Rng & Units 05/28/2019 05/23/2019 04/21/2019  WBC 4.0 - 10.5 K/uL 13.2(H) 8.1 7.0  Hemoglobin 12.0 - 15.0 g/dL 11.4(L) 12.2 13.0  Hematocrit 36.0 - 46.0 % 35.9(L) 38.8 40.1  Platelets 150 - 400 K/uL 223 218 -   BMP Latest Ref Rng & Units 05/28/2019 05/23/2019 04/21/2019  Glucose 70 - 99 mg/dL 160(H) 78 85  BUN 6 - 20 mg/dL 14 25(H) 21  Creatinine 0.44 - 1.00 mg/dL 0.71 1.01(H) 0.78  BUN/Creat Ratio 9 - 23 - - 27(H)  Sodium 135 - 145 mmol/L 139 140 142  Potassium 3.5 - 5.1 mmol/L 3.4(L) 3.1(L) 4.1  Chloride 98 - 111 mmol/L 101 105 102  CO2 22 - 32 mmol/L 27 27 26   Calcium 8.9 - 10.3 mg/dL 9.0 9.2 9.8

## 2019-05-28 NOTE — Anesthesia Postprocedure Evaluation (Signed)
Anesthesia Post Note  Patient: Amy Mooney  Procedure(s) Performed: TOTAL KNEE ARTHROPLASTY (Left Knee)  Patient location during evaluation: Nursing Unit Anesthesia Type: Combined General/Spinal Level of consciousness: awake and alert, oriented and patient cooperative Pain management: pain level controlled Vital Signs Assessment: post-procedure vital signs reviewed and stable Respiratory status: spontaneous breathing Cardiovascular status: stable Postop Assessment: no apparent nausea or vomiting Anesthetic complications: no     Last Vitals:  Vitals:   05/28/19 1032 05/28/19 1300  BP: 106/65 (!) 100/54  Pulse:  77  Resp:  18  Temp:  36.9 C  SpO2:  96%    Last Pain:  Vitals:   05/28/19 1300  TempSrc: Oral  PainSc:                  Edmund Holcomb A

## 2019-05-28 NOTE — TOC Initial Note (Signed)
Transition of Care [facility]) - Initial/Assessment Note    Patient Details  Name: Amy Mooney MRN: [id number] Date of Birth: 1966/01/05  [facility]) CM/SW Contact:    Boneta Lucks, RN Phone Number: 05/28/2019, 11:48 AM  Clinical Narrative:      Patient admitted for osteoarthritis of left knee. Repaired by surgery, Dr Aline Brochure recommending [facility].  Patient used [facility] before.  Called Amy Mooney with [facility] to give the referral.  Patient states she has equipment she needs at home from 1st surgery.             Expected Discharge Plan: [place] Barriers to Discharge: Continued Medical Work up   Patient Goals and CMS Choice Patient states their goals for this hospitalization and ongoing recovery are:: to go home with Home Health. CMS Medicare.gov Compare Post Acute Care list provided to:: Patient Choice offered to / list presented to : Patient  Expected Discharge Plan and Services Expected Discharge Plan: [place] Choice: [place] arrangements for the past 2 months: Single Family Home                           HH Arranged: PT [place]: [facility] ([facility] [employer]) Date [place]: 05/28/19 Time [place]: 1146 Representative spoke with at [place]: Amy Mooney  Prior Living Arrangements/Services Living arrangements for the past 2 months: [place] with:: Spouse Patient language and need for interpreter reviewed:: No Do you feel safe going back to the place where you live?: Yes      Need for Family Participation in Patient Care: Yes (Comment) Care giver support system in place?: Yes (comment) Current home services: DME Criminal Activity/Legal Involvement Pertinent to Current Situation/Hospitalization: No - Comment as needed  Activities of Daily Living Home Assistive Devices/Equipment: None ADL Screening (condition at time of  admission) Patient's cognitive ability adequate to safely complete daily activities?: Yes Is the patient deaf or have difficulty hearing?: No Does the patient have difficulty seeing, even when wearing glasses/contacts?: No Does the patient have difficulty concentrating, remembering, or making decisions?: No Patient able to express need for assistance with ADLs?: Yes Does the patient have difficulty dressing or bathing?: No Independently performs ADLs?: Yes (appropriate for developmental age) Does the patient have difficulty walking or climbing stairs?: Yes Weakness of Legs: Both Weakness of Arms/Hands: None  Permission Sought/Granted   Permission granted to share information with : Yes, Verbal Permission Granted  Share Information with NAME: Amy Mooney  Permission granted to share info w AGENCY: [facility]        Emotional Assessment     Affect (typically observed): Accepting Orientation: : Oriented to Self, Oriented to Place, Oriented to  Time, Oriented to Situation Alcohol / Substance Use: Not Applicable Psych Involvement: No (comment)  Admission diagnosis:  left total knee replacment Patient Active Problem List   Diagnosis Date Noted  . Primary osteoarthritis of left knee   . Anemia 04/30/2019  . Depression 02/03/2019  . Insulin resistance 02/03/2019  . Vitamin D deficiency 12/17/2018  . Other hyperlipidemia 12/17/2018  . Essential hypertension 11/26/2018  . Class 3 severe obesity with serious comorbidity and body mass index (BMI) of 40.0 to 44.9 in adult ([place]) 11/26/2018  . S/P total knee replacement, right 10/29/18   . Grief reaction 06/07/2018  . Colon cancer screening   .  Polyp of sigmoid colon   . Ovarian torsion 03/22/2017  . Obstructive sleep apnea syndrome 05/17/2016  . Preventative health care 03/30/2016  . Restless leg 03/19/2016  . Chronic knee pain 03/19/2016  . Elevated blood pressure 03/19/2016  . Severe obesity (BMI >= 40) ([place]) 06/24/2014  .  Migraine without aura 02/21/2013   PCP:  [name], NP Pharmacy:   [facility], [place] [address] Phone: [phone] Fax: [place], [place] - [place] [address] Phone: [phone] Fax: [phone]      Readmission Risk Interventions No flowsheet data found.

## 2019-05-28 NOTE — Progress Notes (Addendum)
Dressing to left knee shows small amount of drainage through ACE bandage. ABD pads reinforced over existing dressing and wrapped with additional ACE bandage. cryocuff reapplied. Will continue to monitor for bleeding.

## 2019-05-29 LAB — CBC
HCT: 31.9 % — ABNORMAL LOW (ref 36.0–46.0)
Hemoglobin: 10.1 g/dL — ABNORMAL LOW (ref 12.0–15.0)
MCH: 26.6 pg (ref 26.0–34.0)
MCHC: 31.7 g/dL (ref 30.0–36.0)
MCV: 84.2 fL (ref 80.0–100.0)
Platelets: 194 10*3/uL (ref 150–400)
RBC: 3.79 MIL/uL — ABNORMAL LOW (ref 3.87–5.11)
RDW: 15.3 % (ref 11.5–15.5)
WBC: 12.2 10*3/uL — ABNORMAL HIGH (ref 4.0–10.5)
nRBC: 0 % (ref 0.0–0.2)

## 2019-05-29 LAB — GLUCOSE, CAPILLARY
Glucose-Capillary: 89 mg/dL (ref 70–99)
Glucose-Capillary: 117 mg/dL — ABNORMAL HIGH (ref 70–99)

## 2019-05-29 MED ORDER — TRAMADOL HCL 50 MG PO TABS: 50.0000 mg | ORAL_TABLET | Freq: Four times a day (QID) | ORAL | 2 refills | Status: AC

## 2019-05-29 MED ORDER — ASPIRIN 325 MG PO TBEC: 325.0000 mg | DELAYED_RELEASE_TABLET | Freq: Every day | ORAL | 0 refills | Status: DC

## 2019-05-29 MED ORDER — DOCUSATE SODIUM 100 MG PO CAPS: 100.0000 mg | ORAL_CAPSULE | Freq: Two times a day (BID) | ORAL | 0 refills | Status: DC

## 2019-05-29 MED ORDER — METHOCARBAMOL 500 MG PO TABS: 500.0000 mg | ORAL_TABLET | Freq: Four times a day (QID) | ORAL | 1 refills | Status: DC | PRN

## 2019-05-29 MED ORDER — PREGABALIN 50 MG PO CAPS: 50.0000 mg | ORAL_CAPSULE | Freq: Three times a day (TID) | ORAL | 0 refills | Status: DC

## 2019-05-29 MED ORDER — OXYCODONE HCL 5 MG PO TABS: 5.0000 mg | ORAL_TABLET | ORAL | 0 refills | Status: DC | PRN

## 2019-05-29 NOTE — Discharge Summary (Signed)
Physician Discharge Summary  Patient ID: Amy Mooney MRN: [id number] DOB/AGE: [birth year] 53 y.o.  Admit date: 05/27/2019 Discharge date: 05/29/2019  Admission Diagnoses: OA KNEE LEFT   Discharge Diagnoses: SAME  Discharged Condition: good  Procedure: LEFT TKA [place] Hospital Course:  DAY 1 SURGERY  DAY 2 PT DAY 3 PT   CBC Latest Ref Rng & Units 05/29/2019 05/28/2019 05/23/2019  WBC 4.0 - 10.5 K/uL 12.2(H) 13.2(H) 8.1  Hemoglobin 12.0 - 15.0 g/dL 10.1(L) 11.4(L) 12.2  Hematocrit 36.0 - 46.0 % 31.9(L) 35.9(L) 38.8  Platelets 150 - 400 K/uL 194 223 218   BMP Latest Ref Rng & Units 05/28/2019 05/23/2019 04/21/2019  Glucose 70 - 99 mg/dL 160(H) 78 85  BUN 6 - 20 mg/dL 14 25(H) 21  Creatinine 0.44 - 1.00 mg/dL 0.71 1.01(H) 0.78  BUN/Creat Ratio 9 - 23 - - 27(H)  Sodium 135 - 145 mmol/L 139 140 142  Potassium 3.5 - 5.1 mmol/L 3.4(L) 3.1(L) 4.1  Chloride 98 - 111 mmol/L 101 105 102  CO2 22 - 32 mmol/L 27 27 26   Calcium 8.9 - 10.3 mg/dL 9.0 9.2 9.8     Discharge Exam: Blood pressure (!) 111/56, pulse 81, temperature 98.9 F (37.2 C), temperature source Oral, resp. rate 20, height 5\' 3"  (1.6 m), weight 104.3 kg, SpO2 97 %.   Disposition: Discharge disposition: 01-Home or Self Care       Discharge Instructions    CPM   Complete by: As directed    Continuous passive motion machine (CPM):      Use the CPM from 0 to 80 for 6 hours per day.      You may increase by 10 per day.  You may break it up into 2 or 3 sessions per day.      Use CPM for 2 weeks or until you are told to stop.   Call MD / Call 911   Complete by: As directed    If you experience chest pain or shortness of breath, CALL 911 and be transported to the hospital emergency room.  If you develope a fever above 101 F, pus (white drainage) or increased drainage or redness at the wound, or calf pain, call your surgeon's office.   Change dressing   Complete by: As directed    Do not change dressing   Ok to shower    Constipation Prevention   Complete by: As directed    Drink plenty of fluids.  Prune juice may be helpful.  You may use a stool softener, such as Colace (over the counter) 100 mg twice a day.  Use MiraLax (over the counter) for constipation as needed.   Diet - low sodium heart healthy   Complete by: As directed    Increase activity slowly as tolerated   Complete by: As directed    TED hose   Complete by: As directed    Use stockings (TED hose) for 2 weeks on both leg(s).  You may remove them at night for sleeping.     Allergies as of 05/29/2019      Reactions   Nuvigil [armodafinil] Hives   Penicillins Rash, Other (See Comments)   Has patient had a PCN reaction causing immediate rash, facial/tongue/throat swelling, SOB or lightheadedness with hypotension: Unknown Has patient had a PCN reaction causing severe rash involving mucus membranes or skin necrosis: Unknown Has patient had a PCN reaction that required hospitalization: Unknown Has patient had a PCN reaction occurring within  the last 10 years: Unknown If all of the above answers are "NO", then may proceed with Cephalosporin use.   Vancomycin Hives   Lyrica [pregabalin] Hives   Bupropion Other (See Comments)   Causes migraines      Medication List    TAKE these medications   aspirin 325 MG EC tablet Take 1 tablet (325 mg total) by mouth daily with breakfast. Start taking on: May 30, 2019   CALCIUM + D3 PO Take 1 tablet by mouth at bedtime.   diclofenac 75 MG EC tablet Commonly known as: VOLTAREN Take 75 mg by mouth 2 (two) times a day.   docusate sodium 100 MG capsule Commonly known as: COLACE Take 1 capsule (100 mg total) by mouth 2 (two) times daily.   doxycycline 100 MG tablet Commonly known as: VIBRA-TABS Take 100 mg by mouth daily as needed (acne).   fluconazole 100 MG tablet Commonly known as: DIFLUCAN Take 100 mg by mouth daily as needed (FINGER).   hydrochlorothiazide 25 MG tablet Commonly known as:  HYDRODIURIL Take 1 tablet (25 mg total) by mouth daily.   Insulin Pen Needle 32G X 4 MM Misc Commonly known as: BD Pen Needle Nano 2nd Gen 1 Package by Does not apply route 2 (two) times daily.   loratadine 10 MG tablet Commonly known as: CLARITIN Take 10 mg by mouth daily as needed for allergies.   methocarbamol 500 MG tablet Commonly known as: ROBAXIN Take 1 tablet (500 mg total) by mouth every 6 (six) hours as needed for muscle spasms.   multivitamin with minerals tablet Take 1 tablet by mouth daily.   HAIR SKIN AND NAILS FORMULA PO Take 1 tablet by mouth daily.   oxyCODONE 5 MG immediate release tablet Commonly known as: Oxy IR/ROXICODONE Take 1-2 tablets (5-10 mg total) by mouth every 4 (four) hours as needed for moderate pain (pain score 4-6).   pramipexole 0.25 MG tablet Commonly known as: MIRAPEX TAKE 1 TABLET BY MOUTH 30 MINUTES TO 1 HOUR PRIOR TO BEDTIME What changed: See the new instructions.   pregabalin 50 MG capsule Commonly known as: Lyrica Take 1 capsule (50 mg total) by mouth 3 (three) times daily for 14 days.   rizatriptan 10 MG tablet Commonly known as: MAXALT TAKE 1 TABLET BY MOUTH AT MIGRAINE ONSET MAY REPEAT IN 2 HOURS IF HEADACHE PERSISTS.NOT EXCEED 2 TABLETS IN 24HRS What changed: See the new instructions.   Saxenda 18 MG/3ML Sopn Generic drug: Liraglutide -Weight Management Inject 3 mg into the skin daily. What changed: how much to take   traMADol 50 MG tablet Commonly known as: ULTRAM Take 1 tablet (50 mg total) by mouth every 6 (six) hours for 7 days.   valACYclovir 1000 MG tablet Commonly known as: VALTREX Take 1,000 mg by mouth 2 (two) times daily as needed (fever blisters/cold sores.).   Vitamin D3 125 MCG (5000 UT) Caps Take 5,000 Units by mouth at bedtime.            Discharge Care Instructions  (From admission, onward)         Start     Ordered   05/29/19 0000  Change dressing    Comments: Do not change dressing    Ok to shower   05/29/19 1458         Follow-up Information    [facility] Follow up.   Specialty: Home Health Services Why: PT Contact information: [address] [place] [address]  [phone]        Carole Civil, MD Follow up on 06/12/2019.   Specialties: Orthopedic Surgery, Radiology Why: staples out  Contact information: [address] [place] [address] [phone]           Signed: Arther Abbott 05/29/2019, 2:59 PM

## 2019-05-29 NOTE — Progress Notes (Signed)
Physical Therapy Treatment Patient Details Name: Amy Mooney MRN: [id number] DOB: 21-Jan-1966 Today's Date: 05/29/2019  LEFT KNEE ROM: 0-90 degrees AMBULATION DISTANCE: 150 feet using RW with Mod Independent CPM PROM: 0-80 degrees   History of Present Illness Amy Mooney is a 53 y/o female, s/p Left TKA, 05/27/19 with the diagnosis of left knee OA    PT Comments    Patient c/o severe pain with crying while completing exercises requiring assistance to complete terminal knee extensions, limited for completing heel slides and flexing left knee at bedside, demonstrates increased endurance/distance for gait training without loss of balance and stair training not attempted due to patient uses ramp at home.  Patient tolerated sitting up in chair with LLE dangling after therapy - RN notified that patient requesting more pain medication.  Patient will benefit from continued physical therapy in hospital and recommended venue below to increase strength, balance, endurance for safe ADLs and gait.   Follow Up Recommendations  Home health PT;Supervision - Intermittent     Equipment Recommendations  None recommended by PT    Recommendations for Other Services       Precautions / Restrictions Precautions Precautions: Fall Precaution Comments: s/p Left TKA Restrictions Weight Bearing Restrictions: Yes LLE Weight Bearing: Weight bearing as tolerated    Mobility  Bed Mobility Overal bed mobility: Modified Independent Bed Mobility: Supine to Sit           General bed mobility comments: increased time, slightly labored movement of LLE  Transfers Overall transfer level: Needs assistance Equipment used: Rolling walker (2 wheeled) Transfers: Sit to/from [facility] Sit to Stand: Supervision Stand pivot transfers: Supervision       General transfer comment: increased time, labored movement  Ambulation/Gait Ambulation/Gait assistance: Modified independent  (Device/Increase time) Gait Distance (Feet): 150 Feet Assistive device: Rolling walker (2 wheeled) Gait Pattern/deviations: Decreased step length - left;Decreased stance time - left;Decreased stride length Gait velocity: decreased   General Gait Details: increased endurance/distance for ambulation with fair/good return for left heel to toe stepping without loss of balance, mostly limited due to c/o fatigue and left knee pain   Stairs             Wheelchair Mobility    Modified Rankin (Stroke Patients Only)       Balance Overall balance assessment: Needs assistance Sitting-balance support: Feet supported;No upper extremity supported Sitting balance-Leahy Scale: Good Sitting balance - Comments: seated at bedside   Standing balance support: During functional activity;Bilateral upper extremity supported Standing balance-Leahy Scale: Fair Standing balance comment: fair/good using RW                            Cognition Arousal/Alertness: Awake/alert Behavior During Therapy: WFL for tasks assessed/performed Overall Cognitive Status: Within Functional Limits for tasks assessed                                        Exercises Total Joint Exercises Ankle Circles/Pumps: Supine;AROM;Strengthening;Left;10 reps Quad Sets: AROM;Strengthening;Left;Supine;10 reps Short Arc Quad: Supine;AROM;Strengthening;Left;10 reps Heel Slides: AROM;Strengthening;Left;10 reps;Supine    General Comments        Pertinent Vitals/Pain Pain Assessment: Faces Faces Pain Scale: Hurts whole lot Pain Location: left knee, mostly in anterior thigh area Pain Descriptors / Indicators: Sore;Burning;Crying;Guarding;Grimacing Pain Intervention(s): Limited activity within patient's tolerance;Monitored during session;Premedicated before session;Patient requesting pain meds-RN notified  Home Living                      Prior Function            PT Goals (current  goals can now be found in the care plan section) Acute Rehab PT Goals Patient Stated Goal: return home with family to assist Time For Goal Achievement: 05/30/19 Potential to Achieve Goals: Good Progress towards PT goals: Progressing toward goals    Frequency    7X/week      PT Plan Current plan remains appropriate    Co-evaluation              AM-PAC PT "6 Clicks" Mobility   Outcome Measure  Help needed turning from your back to your side while in a flat bed without using bedrails?: None Help needed moving from lying on your back to sitting on the side of a flat bed without using bedrails?: None Help needed moving to and from a bed to a chair (including a wheelchair)?: A Little Help needed standing up from a chair using your arms (e.g., wheelchair or bedside chair)?: A Little Help needed to walk in hospital room?: None Help needed climbing 3-5 steps with a railing? : A Little 6 Click Score: 21    End of Session   Activity Tolerance: Patient tolerated treatment well;Patient limited by fatigue;Patient limited by pain Patient left: in chair;with call bell/phone within reach Nurse Communication: Mobility status PT Visit Diagnosis: Unsteadiness on feet (R26.81);Other abnormalities of gait and mobility (R26.89);Muscle weakness (generalized) (M62.81)     Time: 1030-1103 PT Time Calculation (min) (ACUTE ONLY): 33 min  Charges:  $Gait Training: 8-22 mins $Therapeutic Exercise: 8-22 mins                     11:31 AM, 05/29/19 Lonell Grandchild, MPT Physical Therapist with [facility] 336 [phone] office [phone] mobile phone

## 2019-05-30 ENCOUNTER — Other Ambulatory Visit: Payer: Self-pay | Admitting: *Deleted

## 2019-05-30 ENCOUNTER — Encounter: Payer: Self-pay | Admitting: *Deleted

## 2019-05-30 NOTE — Patient Outreach (Signed)
[facility]) Care Management  05/30/2019  Amy Mooney 10/07/1966 [id number]   Transition of care telephone call  Referral received: 05/30/19 Initial outreach: 05/30/19 Insurance: [place]  Initial unsuccessful telephone call to patient's preferred (mobile) number in order to complete transition of care assessment; no answer, left HIPAA compliant voicemail message requesting return call.   Objective: Per the electronic medical record, Amy Mooney was hospitalized at [facility] from 8/4-05/29/19 for left total knee arthroplasty.. Comorbidities include: migraines, Vit D deficiency, HTN, OSA- does not use CPAP due to claustrophobia, kidney stones, restless leg syndrome, prediabetes with Hgb A1C= 5.5% on 12/17/18, narcolepsy, morbid obesity- she attends the [facility] [place] for weight management assistance. Per the discharge summary, she was discharged to home on 05/29/19 with home health physical therapy to [facility] provided [facility] and all necessary and ordered DME.   Plan: This RNCM will route unsuccessful outreach letter with [place] Management pamphlet and 24 hour Nurse Advice Line Magnet to [place] Management clinical pool to be mailed to patient's home address. This RNCM will attempt another outreach within 4 business days.  Barrington Ellison RN,CCM,CDE [place] Management Coordinator Office Phone [phone] Office Fax [phone]

## 2019-05-30 NOTE — Patient Outreach (Signed)
[facility]) Care Management  05/30/2019  Amy Mooney Feb 23, 1966 [id number]   Transition of care telephone call/Case closure  Referral received: 05/30/19 Initial outreach: 05/30/19 Insurance: [employer] Health Save Plan  Subjective: Amy Mooney returned call to this RNCM,  2 HIPAA identifiers verified, completed transition of care assessment.  Amy Mooney states she is doing well, denies post-operative problems, says surgical incision is unremarkable, states surgical pain well managed with prescribed medications, tolerating diet, denies bowel or bladder problems.  Spouse and sister are assisting with her recovery.  She says [facility] has contacted her and will start home health services of PT tomorrow.  She says she is active with [place] program and continues to attend [facility] for weight management assistance.   She says she does have the hospital indemnity and is aware of how to file the claim. She says she uses a [facility] outpatient pharmacy.  She denies educational needs related to staying safe during the COVID 19 pandemic.    Objective:  Amy Mooney was hospitalized at [facility] from 8/4-05/29/19 for left total knee arthroplasty.Comorbidities include: migraines, Vit D deficiency, HTN, OSA- does not use CPAP due to claustrophobia, kidney stones, restless leg syndrome, prediabetes with Hgb A1C= 5.5% on 12/17/18, narcolepsy, morbid obesity- she attends the [facility] [place] for weight management assistance. Per the discharge summary, she was discharged to home on 05/29/19 with home health physical therapy [facility]. No additional DME was needed.   Assessment:  Patient voices good understanding of all discharge instructions.  See transition of care flowsheet for assessment details.   Plan:  Reviewed hospital discharge diagnosis of left total knee arthroplasty and treatment plan  using hospital discharge instructions, assessing medication adherence, reviewing problems requiring provider notification, and discussing the importance of follow up with surgeon as directed. No ongoing care management needs identified so will close case to [place] Management services and route successful outreach letter with [place] Management pamphlet and 24 Hour Nurse Line Magnet to [place] Management clinical pool to be mailed to patient's home address.   Barrington Ellison RN,CCM,CDE [place] Management Coordinator Office Phone [phone] Office Fax [phone]

## 2019-05-31 DIAGNOSIS — E7849 Other hyperlipidemia: Secondary | ICD-10-CM | POA: Diagnosis not present

## 2019-05-31 DIAGNOSIS — F419 Anxiety disorder, unspecified: Secondary | ICD-10-CM | POA: Diagnosis not present

## 2019-05-31 DIAGNOSIS — F4321 Adjustment disorder with depressed mood: Secondary | ICD-10-CM | POA: Diagnosis not present

## 2019-05-31 DIAGNOSIS — G43909 Migraine, unspecified, not intractable, without status migrainosus: Secondary | ICD-10-CM | POA: Diagnosis not present

## 2019-05-31 DIAGNOSIS — G2581 Restless legs syndrome: Secondary | ICD-10-CM | POA: Diagnosis not present

## 2019-05-31 DIAGNOSIS — G4733 Obstructive sleep apnea (adult) (pediatric): Secondary | ICD-10-CM | POA: Diagnosis not present

## 2019-05-31 DIAGNOSIS — I10 Essential (primary) hypertension: Secondary | ICD-10-CM | POA: Diagnosis not present

## 2019-05-31 DIAGNOSIS — Z471 Aftercare following joint replacement surgery: Secondary | ICD-10-CM | POA: Diagnosis not present

## 2019-05-31 DIAGNOSIS — E559 Vitamin D deficiency, unspecified: Secondary | ICD-10-CM | POA: Diagnosis not present

## 2019-06-02 LAB — TYPE AND SCREEN
ABO/RH(D): A POS
Antibody Screen: NEGATIVE
Unit division: 0
Unit division: 0

## 2019-06-02 LAB — BPAM RBC
Blood Product Expiration Date: 202008242359
Blood Product Expiration Date: 202008242359
Unit Type and Rh: 6200
Unit Type and Rh: 6200

## 2019-06-03 DIAGNOSIS — G4733 Obstructive sleep apnea (adult) (pediatric): Secondary | ICD-10-CM | POA: Diagnosis not present

## 2019-06-03 DIAGNOSIS — E559 Vitamin D deficiency, unspecified: Secondary | ICD-10-CM | POA: Diagnosis not present

## 2019-06-03 DIAGNOSIS — G2581 Restless legs syndrome: Secondary | ICD-10-CM | POA: Diagnosis not present

## 2019-06-03 DIAGNOSIS — Z471 Aftercare following joint replacement surgery: Secondary | ICD-10-CM | POA: Diagnosis not present

## 2019-06-03 DIAGNOSIS — E7849 Other hyperlipidemia: Secondary | ICD-10-CM | POA: Diagnosis not present

## 2019-06-03 DIAGNOSIS — F4321 Adjustment disorder with depressed mood: Secondary | ICD-10-CM | POA: Diagnosis not present

## 2019-06-03 DIAGNOSIS — I10 Essential (primary) hypertension: Secondary | ICD-10-CM | POA: Diagnosis not present

## 2019-06-03 DIAGNOSIS — F419 Anxiety disorder, unspecified: Secondary | ICD-10-CM | POA: Diagnosis not present

## 2019-06-03 DIAGNOSIS — G43909 Migraine, unspecified, not intractable, without status migrainosus: Secondary | ICD-10-CM | POA: Diagnosis not present

## 2019-06-04 ENCOUNTER — Ambulatory Visit: Payer: 59 | Admitting: *Deleted

## 2019-06-04 ENCOUNTER — Other Ambulatory Visit: Payer: Self-pay

## 2019-06-04 ENCOUNTER — Ambulatory Visit (INDEPENDENT_AMBULATORY_CARE_PROVIDER_SITE_OTHER): Payer: 59 | Admitting: Bariatrics

## 2019-06-04 ENCOUNTER — Encounter (INDEPENDENT_AMBULATORY_CARE_PROVIDER_SITE_OTHER): Payer: Self-pay | Admitting: Bariatrics

## 2019-06-04 DIAGNOSIS — E559 Vitamin D deficiency, unspecified: Secondary | ICD-10-CM | POA: Diagnosis not present

## 2019-06-04 DIAGNOSIS — I10 Essential (primary) hypertension: Secondary | ICD-10-CM

## 2019-06-04 DIAGNOSIS — Z6841 Body Mass Index (BMI) 40.0 and over, adult: Secondary | ICD-10-CM

## 2019-06-04 MED ORDER — HYDROCHLOROTHIAZIDE 25 MG PO TABS: 25.0000 mg | ORAL_TABLET | Freq: Every day | ORAL | 0 refills | Status: DC

## 2019-06-05 DIAGNOSIS — E7849 Other hyperlipidemia: Secondary | ICD-10-CM | POA: Diagnosis not present

## 2019-06-05 DIAGNOSIS — E559 Vitamin D deficiency, unspecified: Secondary | ICD-10-CM | POA: Diagnosis not present

## 2019-06-05 DIAGNOSIS — I10 Essential (primary) hypertension: Secondary | ICD-10-CM | POA: Diagnosis not present

## 2019-06-05 DIAGNOSIS — G43909 Migraine, unspecified, not intractable, without status migrainosus: Secondary | ICD-10-CM | POA: Diagnosis not present

## 2019-06-05 DIAGNOSIS — F419 Anxiety disorder, unspecified: Secondary | ICD-10-CM | POA: Diagnosis not present

## 2019-06-05 DIAGNOSIS — Z471 Aftercare following joint replacement surgery: Secondary | ICD-10-CM | POA: Diagnosis not present

## 2019-06-05 DIAGNOSIS — G4733 Obstructive sleep apnea (adult) (pediatric): Secondary | ICD-10-CM | POA: Diagnosis not present

## 2019-06-05 DIAGNOSIS — F4321 Adjustment disorder with depressed mood: Secondary | ICD-10-CM | POA: Diagnosis not present

## 2019-06-05 DIAGNOSIS — G2581 Restless legs syndrome: Secondary | ICD-10-CM | POA: Diagnosis not present

## 2019-06-06 DIAGNOSIS — Z471 Aftercare following joint replacement surgery: Secondary | ICD-10-CM | POA: Diagnosis not present

## 2019-06-06 DIAGNOSIS — E559 Vitamin D deficiency, unspecified: Secondary | ICD-10-CM | POA: Diagnosis not present

## 2019-06-06 DIAGNOSIS — I10 Essential (primary) hypertension: Secondary | ICD-10-CM | POA: Diagnosis not present

## 2019-06-06 DIAGNOSIS — G4733 Obstructive sleep apnea (adult) (pediatric): Secondary | ICD-10-CM | POA: Diagnosis not present

## 2019-06-06 DIAGNOSIS — F4321 Adjustment disorder with depressed mood: Secondary | ICD-10-CM | POA: Diagnosis not present

## 2019-06-06 DIAGNOSIS — G2581 Restless legs syndrome: Secondary | ICD-10-CM | POA: Diagnosis not present

## 2019-06-06 DIAGNOSIS — E7849 Other hyperlipidemia: Secondary | ICD-10-CM | POA: Diagnosis not present

## 2019-06-06 DIAGNOSIS — G43909 Migraine, unspecified, not intractable, without status migrainosus: Secondary | ICD-10-CM | POA: Diagnosis not present

## 2019-06-06 DIAGNOSIS — F419 Anxiety disorder, unspecified: Secondary | ICD-10-CM | POA: Diagnosis not present

## 2019-06-09 DIAGNOSIS — E559 Vitamin D deficiency, unspecified: Secondary | ICD-10-CM | POA: Diagnosis not present

## 2019-06-09 DIAGNOSIS — G4733 Obstructive sleep apnea (adult) (pediatric): Secondary | ICD-10-CM | POA: Diagnosis not present

## 2019-06-09 DIAGNOSIS — G43909 Migraine, unspecified, not intractable, without status migrainosus: Secondary | ICD-10-CM | POA: Diagnosis not present

## 2019-06-09 DIAGNOSIS — Z471 Aftercare following joint replacement surgery: Secondary | ICD-10-CM | POA: Diagnosis not present

## 2019-06-09 DIAGNOSIS — F419 Anxiety disorder, unspecified: Secondary | ICD-10-CM | POA: Diagnosis not present

## 2019-06-09 DIAGNOSIS — F4321 Adjustment disorder with depressed mood: Secondary | ICD-10-CM | POA: Diagnosis not present

## 2019-06-09 DIAGNOSIS — I10 Essential (primary) hypertension: Secondary | ICD-10-CM | POA: Diagnosis not present

## 2019-06-09 DIAGNOSIS — E7849 Other hyperlipidemia: Secondary | ICD-10-CM | POA: Diagnosis not present

## 2019-06-09 DIAGNOSIS — G2581 Restless legs syndrome: Secondary | ICD-10-CM | POA: Diagnosis not present

## 2019-06-09 NOTE — Progress Notes (Signed)
Office: [phone]  /  Fax: [phone] TeleHealth Visit:  Amy Mooney has verbally consented to this TeleHealth visit today. The patient is located at home, the provider is located at the [employer] and [facility] office. The participants in this visit include the listed provider and patient and any and all parties involved. The visit was conducted today via WebEx.  HPI:   Chief Complaint: OBESITY Amy Mooney is here to discuss her progress with her obesity treatment plan. She is on the keep a food journal with 300 to 400 calories and 30 grams of protein daily at lunch and the Category 2 plan and is following her eating plan approximately 50 % of the time. She states she is doing physical therapy 60 minutes 3 times per week. Amy Mooney had knee replacement surgery last week. She is not sure if she has lost weight (weight 236 lbs 06/03/19). She is struggling slightly with getting her water intake. Amy Mooney had been on Saxenda 1.8 mg. We were unable to weigh the patient today for this TeleHealth visit. She feels unsure if she has lost weight since her last visit. She has lost 8 lbs since starting treatment with [name].  Vitamin D deficiency Amy Mooney has a diagnosis of vitamin D deficiency. She is currently taking multi-vitamin and vit D. Amy Mooney denies nausea, vomiting or muscle weakness.  Hypertension Amy Mooney is a 53 y.o. female with hypertension. Amy Mooney denies lightheadedness. She is working weight loss to help control her blood pressure with the goal of decreasing her risk of heart attack and stroke. Amy Mooney blood pressure is well controlled. She stated that she had some slightly low blood pressures, but no specific blood pressure readings.  ASSESSMENT AND PLAN:  Vitamin D deficiency  Essential hypertension - Plan: hydrochlorothiazide (HYDRODIURIL) 25 MG tablet  Class 3 severe obesity with serious comorbidity and body mass index (BMI) of 40.0 to 44.9 in adult, unspecified obesity type  ([place])  PLAN:  Vitamin D Deficiency Amy Mooney was informed that low vitamin D levels contributes to fatigue and are associated with obesity, breast, and colon cancer. Amy Mooney will continue to take OTC vitamin D and she will follow up for routine testing of vitamin D, at least 2-3 times per year. She was informed of the risk of over-replacement of vitamin D and agrees to not increase her dose unless she discusses this with [name] first.  Hypertension We discussed sodium restriction, working on healthy weight loss, and a regular exercise program as the means to achieve improved blood pressure control. Amy Mooney agreed with this plan and agreed to follow up as directed. We will continue to monitor her blood pressure as well as her progress with the above lifestyle modifications. Amy Mooney agrees to take HCTZ 25 mg once daily #30 with no refills and she will watch for signs of hypotension as she continues her lifestyle modifications.  Obesity Amy Mooney is currently in the action stage of change. As such, her goal is to continue with weight loss efforts She has agreed to keep a food journal with 300 to 400 calories and 30 grams of protein daily at lunch and follow the Category 2 plan Amy Mooney will continue physical therapy and progress as per her orthopedist instructions.  We discussed the following Behavioral Modification Strategies today: planning for success, increase H2O intake, no skipping meals, keeping healthy foods in the home, increasing lean protein intake, decreasing simple carbohydrates, increasing vegetables, decrease eating out and work on meal planning and easy cooking plans [facility] will  weigh herself at home before the next visit. She agrees to resume Saxenda at 0.6 mg for 4 days and 1.2 mg for 4 days and then back to the original dose.  Amy Mooney has agreed to follow up with our clinic in 2 weeks. She was informed of the importance of frequent follow up visits to maximize her success with intensive lifestyle  modifications for her multiple health conditions.  ALLERGIES: Allergies  Allergen Reactions  . Nuvigil [Armodafinil] Hives  . Penicillins Rash and Other (See Comments)    Has patient had a PCN reaction causing immediate rash, facial/tongue/throat swelling, SOB or lightheadedness with hypotension: Unknown Has patient had a PCN reaction causing severe rash involving mucus membranes or skin necrosis: Unknown Has patient had a PCN reaction that required hospitalization: Unknown Has patient had a PCN reaction occurring within the last 10 years: Unknown If all of the above answers are "NO", then may proceed with Cephalosporin use.   . Vancomycin Hives  . Lyrica [Pregabalin] Hives  . Bupropion Other (See Comments)    Causes migraines    MEDICATIONS: Current Outpatient Medications on File Prior to Visit  Medication Sig Dispense Refill  . aspirin EC 325 MG EC tablet Take 1 tablet (325 mg total) by mouth daily with breakfast. 30 tablet 0  . Calcium Carb-Cholecalciferol (CALCIUM + D3 PO) Take 1 tablet by mouth at bedtime.    . Cholecalciferol (VITAMIN D3) 5000 units CAPS Take 5,000 Units by mouth at bedtime.     . diclofenac (VOLTAREN) 75 MG EC tablet Take 75 mg by mouth 2 (two) times a day.    . docusate sodium (COLACE) 100 MG capsule Take 1 capsule (100 mg total) by mouth 2 (two) times daily. 10 capsule 0  . doxycycline (VIBRA-TABS) 100 MG tablet Take 100 mg by mouth daily as needed (acne).     . fluconazole (DIFLUCAN) 100 MG tablet Take 100 mg by mouth daily as needed (FINGER).     . Insulin Pen Needle (BD PEN NEEDLE NANO 2ND GEN) 32G X 4 MM MISC 1 Package by Does not apply route 2 (two) times daily. 100 each 0  . Liraglutide -Weight Management (SAXENDA) 18 MG/3ML SOPN Inject 3 mg into the skin daily. (Patient taking differently: Inject 1.8 mg into the skin daily. ) 5 pen 0  . loratadine (CLARITIN) 10 MG tablet Take 10 mg by mouth daily as needed for allergies.    . methocarbamol (ROBAXIN)  500 MG tablet Take 1 tablet (500 mg total) by mouth every 6 (six) hours as needed for muscle spasms. 56 tablet 1  . Multiple Vitamins-Minerals (HAIR SKIN AND NAILS FORMULA PO) Take 1 tablet by mouth daily.    . Multiple Vitamins-Minerals (MULTIVITAMIN WITH MINERALS) tablet Take 1 tablet by mouth daily.    Marland Kitchen oxyCODONE (OXY IR/ROXICODONE) 5 MG immediate release tablet Take 1-2 tablets (5-10 mg total) by mouth every 4 (four) hours as needed for moderate pain (pain score 4-6). 40 tablet 0  . pramipexole (MIRAPEX) 0.25 MG tablet TAKE 1 TABLET BY MOUTH 30 MINUTES TO 1 HOUR PRIOR TO BEDTIME (Patient taking differently: Take 0.25 mg by mouth at bedtime. Take 1 tablet by mouth 30 minutes to 1 hour prior to bedtime.) 90 tablet 1  . pregabalin (LYRICA) 50 MG capsule Take 1 capsule (50 mg total) by mouth 3 (three) times daily for 14 days. 42 capsule 0  . rizatriptan (MAXALT) 10 MG tablet TAKE 1 TABLET BY MOUTH AT MIGRAINE ONSET MAY REPEAT  IN 2 HOURS IF HEADACHE PERSISTS.NOT EXCEED 2 TABLETS IN 24HRS (Patient not taking: No sig reported) 10 tablet 0  . valACYclovir (VALTREX) 1000 MG tablet Take 1,000 mg by mouth 2 (two) times daily as needed (fever blisters/cold sores.).      No current facility-administered medications on file prior to visit.     PAST MEDICAL HISTORY: Past Medical History:  Diagnosis Date  . Chronic knee pain    arthritis  . Fever blister    history of  . History of kidney stones   . Hypertension   . Migraines   . Morbid obesity with BMI of 40.0-44.9, adult ([place])   . 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 Oral herpes   . Osteoarthritis   . Restless leg   . Sleep apnea    per pt not treated; not able to use CPAP due to claustrophobia  . Vitamin D deficiency     PAST SURGICAL HISTORY: Past Surgical History:  Procedure Laterality Date  . ABDOMINAL HYSTERECTOMY  2010  . artroscopic meniscus repair Bilateral   . cervix removed, bladder tack     2015  . COLONOSCOPY WITH  PROPOFOL N/A 06/19/2017   Procedure: COLONOSCOPY WITH PROPOFOL;  Surgeon: [name], MD;  Location: [facility] ENDOSCOPY;  Service: Endoscopy;  Laterality: N/A;  . LAPAROSCOPIC SALPINGO OOPHERECTOMY Right 03/22/2017   Procedure: LAPAROSCOPIC SALPINGO OOPHORECTOMY;  Surgeon: Ward, Honor Loh, MD;  Location: [facility] ORS;  Service: Gynecology;  Laterality: Right;  . TOTAL KNEE ARTHROPLASTY Right 10/29/2018   Procedure: TOTAL KNEE ARTHROPLASTY;  Surgeon: Carole Civil, MD;  Location: [facility] ORS;  Service: Orthopedics;  Laterality: Right;  . TOTAL KNEE ARTHROPLASTY Left 05/27/2019   Procedure: TOTAL KNEE ARTHROPLASTY;  Surgeon: Carole Civil, MD;  Location: [facility] ORS;  Service: Orthopedics;  Laterality: Left;    SOCIAL HISTORY: Social History   Tobacco Use  . Smoking status: Former Smoker    Packs/day: 0.50    Years: 4.00    Pack years: 2.00    Quit date: 10/23/1986    Years since quitting: 32.6  . Smokeless tobacco: Never Used  Substance Use Topics  . Alcohol use: No  . Drug use: No    FAMILY HISTORY: Family History  Problem Relation Age of Onset  . Breast cancer Mother 80  . Diabetes Mother   . Depression Mother   . Anxiety disorder Mother   . Obesity Mother   . Breast cancer Paternal Grandmother     ROS: Review of Systems  Constitutional: Negative for weight loss.  Gastrointestinal: Negative for nausea and vomiting.  Musculoskeletal:       Negative for muscle weakness  Neurological:       Negative for lightheadedness    PHYSICAL EXAM: Pt in no acute distress  RECENT LABS AND TESTS: BMET    Component Value Date/Time   NA 139 05/28/2019 0603   NA 142 04/21/2019 1143   NA 137 05/23/2013 1515   K 3.4 (L) 05/28/2019 0603   K 3.9 05/23/2013 1515   CL 101 05/28/2019 0603   CL 104 05/23/2013 1515   CO2 27 05/28/2019 0603   CO2 35 (H) 05/23/2013 1515   GLUCOSE 160 (H) 05/28/2019 0603   GLUCOSE 68 05/23/2013 1515   BUN 14 05/28/2019 0603   BUN 21 04/21/2019 1143   BUN 10  05/23/2013 1515   CREATININE 0.71 05/28/2019 0603   CREATININE 0.86 05/23/2013 1515   CALCIUM 9.0 05/28/2019 0603   CALCIUM 8.4 (L) 05/23/2013  [employer] 05/28/2019 0603   GFRNONAA >60 05/23/2013 1515   GFRAA >60 05/28/2019 0603   GFRAA >60 05/23/2013 1515   Lab Results  Component Value Date   HGBA1C 5.6 04/21/2019   HGBA1C 5.6 12/17/2018   HGBA1C 5.7 (H) 08/19/2018   HGBA1C 5.9 06/03/2018   HGBA1C 5.8 04/27/2017   Lab Results  Component Value Date   INSULIN 22.8 04/21/2019   INSULIN 25.6 (H) 12/17/2018   INSULIN 29.0 (H) 08/19/2018   CBC    Component Value Date/Time   WBC 12.2 (H) 05/29/2019 0525   RBC 3.79 (L) 05/29/2019 0525   HGB 10.1 (L) 05/29/2019 0525   HGB 13.0 04/21/2019 1143   HCT 31.9 (L) 05/29/2019 0525   HCT 40.1 04/21/2019 1143   PLT 194 05/29/2019 0525   PLT 225 11/17/2016 1000   MCV 84.2 05/29/2019 0525   MCV 81 04/21/2019 1143   MCV 83 05/14/2013 1149   MCH 26.6 05/29/2019 0525   MCHC 31.7 05/29/2019 0525   RDW 15.3 05/29/2019 0525   RDW 15.6 (H) 04/21/2019 1143   RDW 14.1 05/14/2013 1149   LYMPHSABS 2.9 05/23/2019 1340   LYMPHSABS 2.4 04/21/2019 1143   MONOABS 0.5 05/23/2019 1340   EOSABS 0.2 05/23/2019 1340   EOSABS 0.2 04/21/2019 1143   BASOSABS 0.0 05/23/2019 1340   BASOSABS 0.1 04/21/2019 1143   Iron/TIBC/Ferritin/ %Sat    Component Value Date/Time   IRON 50 06/24/2014 1542   TIBC 328 06/24/2014 1542   FERRITIN 130 06/24/2014 1542   IRONPCTSAT 15 06/24/2014 1542   Lipid Panel     Component Value Date/Time   CHOL 171 12/17/2018 1034   TRIG 92 12/17/2018 1034   HDL 55 12/17/2018 1034   CHOLHDL 3 06/03/2018 0746   VLDL 28.0 06/03/2018 0746   LDLCALC 98 12/17/2018 1034   Hepatic Function Panel     Component Value Date/Time   PROT 7.4 04/21/2019 1143   ALBUMIN 4.5 04/21/2019 1143   AST 29 04/21/2019 1143   ALT 36 (H) 04/21/2019 1143   ALKPHOS 104 04/21/2019 1143   BILITOT 0.5 04/21/2019 1143      Component Value  Date/Time   TSH 2.850 08/19/2018 1005   TSH 1.70 03/24/2016 1259     Ref. Range 04/21/2019 11:43  Vitamin D, 25-Hydroxy Latest Ref Range: 30.0 - 100.0 ng/mL 74.7    I, Doreene Nest, am acting as Location manager for [employer]. Owens Shark, DO  I have reviewed the above documentation for accuracy and completeness, and I agree with the above. -Jearld Lesch, DO

## 2019-06-11 ENCOUNTER — Ambulatory Visit: Payer: 59 | Admitting: Orthopedic Surgery

## 2019-06-11 DIAGNOSIS — G43909 Migraine, unspecified, not intractable, without status migrainosus: Secondary | ICD-10-CM | POA: Diagnosis not present

## 2019-06-11 DIAGNOSIS — G4733 Obstructive sleep apnea (adult) (pediatric): Secondary | ICD-10-CM | POA: Diagnosis not present

## 2019-06-11 DIAGNOSIS — E7849 Other hyperlipidemia: Secondary | ICD-10-CM | POA: Diagnosis not present

## 2019-06-11 DIAGNOSIS — F4321 Adjustment disorder with depressed mood: Secondary | ICD-10-CM | POA: Diagnosis not present

## 2019-06-11 DIAGNOSIS — F419 Anxiety disorder, unspecified: Secondary | ICD-10-CM | POA: Diagnosis not present

## 2019-06-11 DIAGNOSIS — Z471 Aftercare following joint replacement surgery: Secondary | ICD-10-CM | POA: Diagnosis not present

## 2019-06-11 DIAGNOSIS — I10 Essential (primary) hypertension: Secondary | ICD-10-CM | POA: Diagnosis not present

## 2019-06-11 DIAGNOSIS — E559 Vitamin D deficiency, unspecified: Secondary | ICD-10-CM | POA: Diagnosis not present

## 2019-06-11 DIAGNOSIS — G2581 Restless legs syndrome: Secondary | ICD-10-CM | POA: Diagnosis not present

## 2019-06-12 ENCOUNTER — Ambulatory Visit (INDEPENDENT_AMBULATORY_CARE_PROVIDER_SITE_OTHER): Payer: 59 | Admitting: Orthopedic Surgery

## 2019-06-12 ENCOUNTER — Other Ambulatory Visit: Payer: Self-pay

## 2019-06-12 ENCOUNTER — Ambulatory Visit: Payer: 59 | Admitting: Orthopaedic Surgery

## 2019-06-12 ENCOUNTER — Encounter: Payer: Self-pay | Admitting: Orthopedic Surgery

## 2019-06-12 DIAGNOSIS — Z96652 Presence of left artificial knee joint: Secondary | ICD-10-CM

## 2019-06-12 MED ORDER — HYDROCODONE-ACETAMINOPHEN 10-325 MG PO TABS: 1.0000 | ORAL_TABLET | ORAL | 0 refills | Status: DC | PRN

## 2019-06-12 NOTE — Patient Instructions (Signed)
Return to work light duty 9/8 light duty

## 2019-06-12 NOTE — Progress Notes (Signed)
Chief Complaint  Patient presents with  . Post-op Follow-up    left tka 05/27/2019   Pod 16 left total knee  Wound looks great staples were removed wound was clean  Her flexion is 80 degrees her extension is full she is ambulatory with a cane  She says she is having very little pain and is using medicine mainly at night and using some Robaxin for nighttime muscle spasms  Recommend start physical therapy at [facility] in [place]  Follow-up in 4 weeks  Return to work after Labor Day   Encounter Diagnosis  Name Primary?  . S/P TKR (total knee replacement), left Yes    [facility] in [place] [facility]

## 2019-06-13 ENCOUNTER — Telehealth (HOSPITAL_COMMUNITY): Payer: Self-pay | Admitting: Specialist

## 2019-06-13 ENCOUNTER — Telehealth: Payer: Self-pay | Admitting: Orthopedic Surgery

## 2019-06-13 DIAGNOSIS — Z96652 Presence of left artificial knee joint: Secondary | ICD-10-CM

## 2019-06-13 NOTE — Telephone Encounter (Signed)
Patient relays she was called by Amy Mo[facility] to schedule therapy, and states she requested Amy Mooney in [place], as done in past. Please advise.

## 2019-06-13 NOTE — Telephone Encounter (Signed)
Ok to schedule the pt in clinic next week. S/w the pt and she stated that she will doing her physical therapy in [place] which is closer to her home. The place she mentioned is called Amy Mooney.

## 2019-06-13 NOTE — Telephone Encounter (Signed)
Completed, 06/12/19 - refer to Dr Ruthe Mannan note.

## 2019-06-16 ENCOUNTER — Ambulatory Visit: Payer: 59 | Admitting: Orthopedic Surgery

## 2019-06-16 DIAGNOSIS — Z96652 Presence of left artificial knee joint: Secondary | ICD-10-CM | POA: Diagnosis not present

## 2019-06-16 DIAGNOSIS — M25561 Pain in right knee: Secondary | ICD-10-CM | POA: Diagnosis not present

## 2019-06-16 DIAGNOSIS — R2689 Other abnormalities of gait and mobility: Secondary | ICD-10-CM | POA: Diagnosis not present

## 2019-06-16 NOTE — Telephone Encounter (Signed)
I was out of the office last week, so could not fax to [facility] I have printed the order and faxed.

## 2019-06-17 ENCOUNTER — Telehealth (INDEPENDENT_AMBULATORY_CARE_PROVIDER_SITE_OTHER): Payer: 59 | Admitting: Family Medicine

## 2019-06-17 ENCOUNTER — Other Ambulatory Visit: Payer: Self-pay

## 2019-06-17 ENCOUNTER — Encounter (INDEPENDENT_AMBULATORY_CARE_PROVIDER_SITE_OTHER): Payer: Self-pay | Admitting: Family Medicine

## 2019-06-17 DIAGNOSIS — E559 Vitamin D deficiency, unspecified: Secondary | ICD-10-CM

## 2019-06-17 DIAGNOSIS — Z6841 Body Mass Index (BMI) 40.0 and over, adult: Secondary | ICD-10-CM

## 2019-06-17 DIAGNOSIS — E66813 Obesity, class 3: Secondary | ICD-10-CM

## 2019-06-17 NOTE — Progress Notes (Signed)
Office: [phone]  /  Fax: [phone] TeleHealth Visit:  [name] has verbally consented to this TeleHealth visit today. The patient is located at home, the provider is located at the [employer] [facility] office. The participants in this visit include the listed provider and patient. The visit was conducted today via Webex.  HPI:   Chief Complaint: OBESITY Amy Mooney is here to discuss her progress with her obesity treatment plan. She is on the Category 2 plan and is following her eating plan approximately 50% of the time. She states she is exercising 0 minutes 0 times per week. Amy Mooney reports her weight to be 236 lbs today. She is taking Saxenda 1.2 mg daily and will be increasing to 1.8 mg daily tomorrow, which she had been on prior to surgery. She had a left total knee replacement 05/27/2019 and her appetite is decreased since the surgery. We were unable to weigh the patient today for this TeleHealth visit. She feels as if she has maintained her weight since her last visit. She has lost 8 lbs since starting treatment with [name].  Vitamin D deficiency Amy Mooney has a diagnosis of Vitamin D deficiency, which is now at goal (74.7 on 04/21/2019). She is currently taking OTC Vit D3 5,000 IU daily and denies nausea, vomiting or muscle weakness.  ASSESSMENT AND PLAN:  Vitamin D deficiency  Class 3 severe obesity with serious comorbidity and body mass index (BMI) of 40.0 to 44.9 in adult, unspecified obesity type (HCC)  PLAN:  Vitamin D Deficiency Amy Mooney was informed that low Vitamin D levels contributes to fatigue and are associated with obesity, breast, and colon cancer. She agrees to continue taking  OTC Vit D @ 5,000 IU daily and will follow-up for routine testing of Vitamin D, at least 2-3 times per year. She was informed of the risk of over-replacement of Vitamin D and agrees to not increase her dose unless she discusses this with [name] first. [facility] agrees to follow-up with our clinic  in 2-3 weeks.  Obesity Amy Mooney is currently in the action stage of change. As such, her goal is to continue with weight loss efforts. She has agreed to follow the Category 2 plan.  Amy Mooney will be doing PT for her knee 2 times weekly. We discussed the following Behavioral Modification Strategies today: increasing lean protein intake, decreasing simple carbohydrates, and planning for success.   Amy Mooney will increase Saxenda to 1.8 mg daily tomorrow as instructed previously.  Amy Mooney has agreed to follow-up with our clinic in 2-3 weeks. She was informed of the importance of frequent follow-up visits to maximize her success with intensive lifestyle modifications for her multiple health conditions.  ALLERGIES: Allergies  Allergen Reactions  . Nuvigil [Armodafinil] Hives  . Penicillins Rash and Other (See Comments)    Has patient had a PCN reaction causing immediate rash, facial/tongue/throat swelling, SOB or lightheadedness with hypotension: Unknown Has patient had a PCN reaction causing severe rash involving mucus membranes or skin necrosis: Unknown Has patient had a PCN reaction that required hospitalization: Unknown Has patient had a PCN reaction occurring within the last 10 years: Unknown If all of the above answers are "NO", then may proceed with Cephalosporin use.   . Vancomycin Hives  . Lyrica [Pregabalin] Hives  . Bupropion Other (See Comments)    Causes migraines    MEDICATIONS: Current Outpatient Medications on File Prior to Visit  Medication Sig Dispense Refill  . aspirin EC 325 MG EC tablet Take 1 tablet (325  mg total) by mouth daily with breakfast. 30 tablet 0  . Calcium Carb-Cholecalciferol (CALCIUM + D3 PO) Take 1 tablet by mouth at bedtime.    . Cholecalciferol (VITAMIN D3) 5000 units CAPS Take 5,000 Units by mouth at bedtime.     Marland Kitchen doxycycline (VIBRA-TABS) 100 MG tablet Take 100 mg by mouth daily as needed (acne).     . fluconazole (DIFLUCAN) 100 MG tablet Take 100 mg by  mouth daily as needed (FINGER).     . hydrochlorothiazide (HYDRODIURIL) 25 MG tablet Take 1 tablet (25 mg total) by mouth daily. 30 tablet 0  . HYDROcodone-acetaminophen (NORCO) 10-325 MG tablet Take 1 tablet by mouth every 4 (four) hours as needed. 42 tablet 0  . Insulin Pen Needle (BD PEN NEEDLE NANO 2ND GEN) 32G X 4 MM MISC 1 Package by Does not apply route 2 (two) times daily. 100 each 0  . Liraglutide -Weight Management (SAXENDA) 18 MG/3ML SOPN Inject 3 mg into the skin daily. (Patient taking differently: Inject 1.8 mg into the skin daily. ) 5 pen 0  . loratadine (CLARITIN) 10 MG tablet Take 10 mg by mouth daily as needed for allergies.    . methocarbamol (ROBAXIN) 500 MG tablet Take 1 tablet (500 mg total) by mouth every 6 (six) hours as needed for muscle spasms. 56 tablet 1  . Multiple Vitamins-Minerals (HAIR SKIN AND NAILS FORMULA PO) Take 1 tablet by mouth daily.    . Multiple Vitamins-Minerals (MULTIVITAMIN WITH MINERALS) tablet Take 1 tablet by mouth daily.    Marland Kitchen oxyCODONE (OXY IR/ROXICODONE) 5 MG immediate release tablet Take 1-2 tablets (5-10 mg total) by mouth every 4 (four) hours as needed for moderate pain (pain score 4-6). 40 tablet 0  . pramipexole (MIRAPEX) 0.25 MG tablet TAKE 1 TABLET BY MOUTH 30 MINUTES TO 1 HOUR PRIOR TO BEDTIME (Patient taking differently: Take 0.25 mg by mouth at bedtime. Take 1 tablet by mouth 30 minutes to 1 hour prior to bedtime.) 90 tablet 1  . pregabalin (LYRICA) 50 MG capsule Take 1 capsule (50 mg total) by mouth 3 (three) times daily for 14 days. 42 capsule 0  . rizatriptan (MAXALT) 10 MG tablet TAKE 1 TABLET BY MOUTH AT MIGRAINE ONSET MAY REPEAT IN 2 HOURS IF HEADACHE PERSISTS.NOT EXCEED 2 TABLETS IN 24HRS (Patient not taking: No sig reported) 10 tablet 0  . valACYclovir (VALTREX) 1000 MG tablet Take 1,000 mg by mouth 2 (two) times daily as needed (fever blisters/cold sores.).      No current facility-administered medications on file prior to visit.      PAST MEDICAL HISTORY: Past Medical History:  Diagnosis Date  . Chronic knee pain    arthritis  . Fever blister    history of  . History of kidney stones   . Hypertension   . Migraines   . Morbid obesity with BMI of 40.0-44.9, adult ([place])   . 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 Oral herpes   . Osteoarthritis   . Restless leg   . Sleep apnea    per pt not treated; not able to use CPAP due to claustrophobia  . Vitamin D deficiency     PAST SURGICAL HISTORY: Past Surgical History:  Procedure Laterality Date  . ABDOMINAL HYSTERECTOMY  2010  . artroscopic meniscus repair Bilateral   . cervix removed, bladder tack     2015  . COLONOSCOPY WITH PROPOFOL N/A 06/19/2017   Procedure: COLONOSCOPY WITH PROPOFOL;  Surgeon: [name], MD;  Location:  [facility];  Service: Endoscopy;  Laterality: N/A;  . LAPAROSCOPIC SALPINGO OOPHERECTOMY Right 03/22/2017   Procedure: LAPAROSCOPIC SALPINGO OOPHORECTOMY;  Surgeon: Ward, Honor Loh, MD;  Location: [facility];  Service: Gynecology;  Laterality: Right;  . TOTAL KNEE ARTHROPLASTY Right 10/29/2018   Procedure: TOTAL KNEE ARTHROPLASTY;  Surgeon: Carole Civil, MD;  Location: [facility];  Service: Orthopedics;  Laterality: Right;  . TOTAL KNEE ARTHROPLASTY Left 05/27/2019   Procedure: TOTAL KNEE ARTHROPLASTY;  Surgeon: Carole Civil, MD;  Location: [facility];  Service: Orthopedics;  Laterality: Left;    SOCIAL HISTORY: Social History   Tobacco Use  . Smoking status: Former Smoker    Packs/day: 0.50    Years: 4.00    Pack years: 2.00    Quit date: 10/23/1986    Years since quitting: 32.6  . Smokeless tobacco: Never Used  Substance Use Topics  . Alcohol use: No  . Drug use: No    FAMILY HISTORY: Family History  Problem Relation Age of Onset  . Breast cancer Mother 64  . Diabetes Mother   . Depression Mother   . Anxiety disorder Mother   . Obesity Mother   . Breast cancer Paternal Grandmother    ROS: Review of Systems   Gastrointestinal: Negative for nausea and vomiting.  Musculoskeletal:       Negative for muscle weakness.   PHYSICAL EXAM: Pt in no acute distress  RECENT LABS AND TESTS: BMET    Component Value Date/Time   NA 139 05/28/2019 0603   NA 142 04/21/2019 1143   NA 137 05/23/2013 1515   K 3.4 (L) 05/28/2019 0603   K 3.9 05/23/2013 1515   CL 101 05/28/2019 0603   CL 104 05/23/2013 1515   CO2 27 05/28/2019 0603   CO2 35 (H) 05/23/2013 1515   GLUCOSE 160 (H) 05/28/2019 0603   GLUCOSE 68 05/23/2013 1515   BUN 14 05/28/2019 0603   BUN 21 04/21/2019 1143   BUN 10 05/23/2013 1515   CREATININE 0.71 05/28/2019 0603   CREATININE 0.86 05/23/2013 1515   CALCIUM 9.0 05/28/2019 0603   CALCIUM 8.4 (L) 05/23/2013 1515   GFRNONAA >60 05/28/2019 0603   GFRNONAA >60 05/23/2013 1515   GFRAA >60 05/28/2019 0603   GFRAA >60 05/23/2013 1515   Lab Results  Component Value Date   HGBA1C 5.6 04/21/2019   HGBA1C 5.6 12/17/2018   HGBA1C 5.7 (H) 08/19/2018   HGBA1C 5.9 06/03/2018   HGBA1C 5.8 04/27/2017   Lab Results  Component Value Date   INSULIN 22.8 04/21/2019   INSULIN 25.6 (H) 12/17/2018   INSULIN 29.0 (H) 08/19/2018   CBC    Component Value Date/Time   WBC 12.2 (H) 05/29/2019 0525   RBC 3.79 (L) 05/29/2019 0525   HGB 10.1 (L) 05/29/2019 0525   HGB 13.0 04/21/2019 1143   HCT 31.9 (L) 05/29/2019 0525   HCT 40.1 04/21/2019 1143   PLT 194 05/29/2019 0525   PLT 225 11/17/2016 1000   MCV 84.2 05/29/2019 0525   MCV 81 04/21/2019 1143   MCV 83 05/14/2013 1149   MCH 26.6 05/29/2019 0525   MCHC 31.7 05/29/2019 0525   RDW 15.3 05/29/2019 0525   RDW 15.6 (H) 04/21/2019 1143   RDW 14.1 05/14/2013 1149   LYMPHSABS 2.9 05/23/2019 1340   LYMPHSABS 2.4 04/21/2019 1143   MONOABS 0.5 05/23/2019 1340   EOSABS 0.2 05/23/2019 1340   EOSABS 0.2 04/21/2019 1143   BASOSABS 0.0 05/23/2019 1340   BASOSABS 0.1  04/21/2019 1143   Iron/TIBC/Ferritin/ %Sat    Component Value Date/Time   IRON 50  06/24/2014 1542   TIBC 328 06/24/2014 1542   FERRITIN 130 06/24/2014 1542   IRONPCTSAT 15 06/24/2014 1542   Lipid Panel     Component Value Date/Time   CHOL 171 12/17/2018 1034   TRIG 92 12/17/2018 1034   HDL 55 12/17/2018 1034   CHOLHDL 3 06/03/2018 0746   VLDL 28.0 06/03/2018 0746   LDLCALC 98 12/17/2018 1034   Hepatic Function Panel     Component Value Date/Time   PROT 7.4 04/21/2019 1143   ALBUMIN 4.5 04/21/2019 1143   AST 29 04/21/2019 1143   ALT 36 (H) 04/21/2019 1143   ALKPHOS 104 04/21/2019 1143   BILITOT 0.5 04/21/2019 1143      Component Value Date/Time   TSH 2.850 08/19/2018 1005   TSH 1.70 03/24/2016 1259   Results for MICKEY, AUCHTER (MRN [id number]) as of 06/17/2019 16:26  Ref. Range 04/21/2019 11:43  Vitamin D, 25-Hydroxy Latest Ref Range: 30.0 - 100.0 ng/mL 74.7   I, Michaelene Song, am acting as Location manager for [employer], FNP

## 2019-06-18 DIAGNOSIS — H5213 Myopia, bilateral: Secondary | ICD-10-CM | POA: Diagnosis not present

## 2019-06-19 DIAGNOSIS — M25561 Pain in right knee: Secondary | ICD-10-CM | POA: Diagnosis not present

## 2019-06-19 DIAGNOSIS — R2689 Other abnormalities of gait and mobility: Secondary | ICD-10-CM | POA: Diagnosis not present

## 2019-06-19 DIAGNOSIS — Z96652 Presence of left artificial knee joint: Secondary | ICD-10-CM | POA: Diagnosis not present

## 2019-06-23 DIAGNOSIS — R2689 Other abnormalities of gait and mobility: Secondary | ICD-10-CM | POA: Diagnosis not present

## 2019-06-23 DIAGNOSIS — Z96652 Presence of left artificial knee joint: Secondary | ICD-10-CM | POA: Diagnosis not present

## 2019-06-23 DIAGNOSIS — M25561 Pain in right knee: Secondary | ICD-10-CM | POA: Diagnosis not present

## 2019-06-26 DIAGNOSIS — R2689 Other abnormalities of gait and mobility: Secondary | ICD-10-CM | POA: Diagnosis not present

## 2019-06-26 DIAGNOSIS — Z96652 Presence of left artificial knee joint: Secondary | ICD-10-CM | POA: Diagnosis not present

## 2019-06-26 DIAGNOSIS — M25561 Pain in right knee: Secondary | ICD-10-CM | POA: Diagnosis not present

## 2019-07-01 DIAGNOSIS — R2689 Other abnormalities of gait and mobility: Secondary | ICD-10-CM | POA: Diagnosis not present

## 2019-07-01 DIAGNOSIS — M25561 Pain in right knee: Secondary | ICD-10-CM | POA: Diagnosis not present

## 2019-07-01 DIAGNOSIS — Z96652 Presence of left artificial knee joint: Secondary | ICD-10-CM | POA: Diagnosis not present

## 2019-07-03 DIAGNOSIS — R2689 Other abnormalities of gait and mobility: Secondary | ICD-10-CM | POA: Diagnosis not present

## 2019-07-03 DIAGNOSIS — M25561 Pain in right knee: Secondary | ICD-10-CM | POA: Diagnosis not present

## 2019-07-03 DIAGNOSIS — Z96652 Presence of left artificial knee joint: Secondary | ICD-10-CM | POA: Diagnosis not present

## 2019-07-04 ENCOUNTER — Other Ambulatory Visit: Payer: Self-pay | Admitting: Primary Care

## 2019-07-04 DIAGNOSIS — G2581 Restless legs syndrome: Secondary | ICD-10-CM

## 2019-07-04 MED FILL — PRAMIPEXOLE 0.25 MG TABLET: 0.25 | 90 days supply | Qty: 90 | Fill #0

## 2019-07-07 ENCOUNTER — Encounter (INDEPENDENT_AMBULATORY_CARE_PROVIDER_SITE_OTHER): Payer: Self-pay

## 2019-07-07 DIAGNOSIS — M25561 Pain in right knee: Secondary | ICD-10-CM | POA: Diagnosis not present

## 2019-07-07 DIAGNOSIS — Z96652 Presence of left artificial knee joint: Secondary | ICD-10-CM | POA: Diagnosis not present

## 2019-07-07 DIAGNOSIS — R2689 Other abnormalities of gait and mobility: Secondary | ICD-10-CM | POA: Diagnosis not present

## 2019-07-09 ENCOUNTER — Other Ambulatory Visit: Payer: Self-pay

## 2019-07-09 ENCOUNTER — Ambulatory Visit (INDEPENDENT_AMBULATORY_CARE_PROVIDER_SITE_OTHER): Payer: 59 | Admitting: Family Medicine

## 2019-07-09 VITALS — BP 121/79 | HR 70 | Temp 98.5°F | Ht 63.0 in | Wt 233.2 lb

## 2019-07-09 DIAGNOSIS — Z6841 Body Mass Index (BMI) 40.0 and over, adult: Secondary | ICD-10-CM | POA: Diagnosis not present

## 2019-07-09 DIAGNOSIS — E8881 Metabolic syndrome: Secondary | ICD-10-CM

## 2019-07-09 DIAGNOSIS — Z9189 Other specified personal risk factors, not elsewhere classified: Secondary | ICD-10-CM

## 2019-07-09 DIAGNOSIS — I10 Essential (primary) hypertension: Secondary | ICD-10-CM

## 2019-07-09 MED ORDER — SAXENDA 18 MG/3ML ~~LOC~~ SOPN: 3.0000 mg | PEN_INJECTOR | Freq: Every day | SUBCUTANEOUS | 0 refills | Status: DC

## 2019-07-09 MED ORDER — HYDROCHLOROTHIAZIDE 25 MG PO TABS: 25.0000 mg | ORAL_TABLET | Freq: Every day | ORAL | 0 refills | Status: DC

## 2019-07-09 MED ORDER — SAXENDA 18 MG/3ML ~~LOC~~ SOPN: 2.4000 mg | PEN_INJECTOR | Freq: Every day | SUBCUTANEOUS | 0 refills | Status: DC

## 2019-07-10 DIAGNOSIS — Z96652 Presence of left artificial knee joint: Secondary | ICD-10-CM | POA: Insufficient documentation

## 2019-07-10 DIAGNOSIS — R2689 Other abnormalities of gait and mobility: Secondary | ICD-10-CM | POA: Diagnosis not present

## 2019-07-10 DIAGNOSIS — M25561 Pain in right knee: Secondary | ICD-10-CM | POA: Diagnosis not present

## 2019-07-10 HISTORY — DX: Presence of left artificial knee joint: Z96.652

## 2019-07-10 NOTE — Progress Notes (Signed)
Office: [phone]  /  Fax: [phone]   HPI:   Chief Complaint: OBESITY Amy Mooney is here to discuss her progress with her obesity treatment plan. She is on the Category 2 plan and is following her eating plan approximately 80 % of the time. She states she is exercising 0 minutes 0 times per week. Amy Mooney's appetite is back. She had reported her appetite was decreased at last visit after having left total knee surgery on 05/27/19. The structure of getting back to work is helping her stay on the plan. Amy Mooney is on Saxenda 1.8 mg daily. She denies nausea or constipation. She admits to polyphagia at times. Her weight is 233 lb 3.2 oz (105.8 kg) today and has had a weight loss of 3 pounds since her last in-office visit. She has lost 11 lbs since starting treatment with [name].  Hypertension Amy Mooney is a 53 y.o. female with hypertension. Amy Mooney's blood pressure is well controlled with HCTZ. Amy Mooney denies chest pain or shortness of breath on exertion. She is working weight loss to help control her blood pressure with the goal of decreasing her risk of heart attack and stroke.   Insulin Resistance Amy Mooney has a diagnosis of insulin resistance based on her elevated fasting insulin level >5. Although Amy Mooney's blood glucose readings are still under good control, insulin resistance puts her at greater risk of metabolic syndrome and diabetes. She is on Saxenda for appetite. Amy Mooney is having some polyphagia. She continues to work on diet and exercise to decrease risk of diabetes. Lab Results  Component Value Date   HGBA1C 5.6 04/21/2019    At risk for diabetes Amy Mooney is at higher than average risk for developing diabetes due to her obesity and insulin resistance. She currently denies polyuria or polydipsia.  ASSESSMENT AND PLAN:  Essential hypertension - Plan: hydrochlorothiazide (HYDRODIURIL) 25 MG tablet, DISCONTINUED: hydrochlorothiazide (HYDRODIURIL) 25 MG tablet  Insulin resistance -  Plan: Liraglutide -Weight Management (SAXENDA) 18 MG/3ML SOPN  At risk for diabetes mellitus  Class 3 severe obesity with serious comorbidity and body mass index (BMI) of 40.0 to 44.9 in adult, unspecified obesity type (Amy Mooney) - Plan: DISCONTINUED: Liraglutide -Weight Management (SAXENDA) 18 MG/3ML SOPN  PLAN:  Hypertension We discussed sodium restriction, working on healthy weight loss, and a regular exercise program as the means to achieve improved blood pressure control. Amy Mooney agreed with this plan and agreed to follow up as directed. We will continue to monitor her blood pressure as well as her progress with the above lifestyle modifications. Amy Mooney agrees to continue HCTZ 25 mg once daily #30 with no refills and she will watch for signs of hypotension as she continues her lifestyle modifications.  Insulin Resistance Amy Mooney will continue to work on weight loss, exercise, and decreasing simple carbohydrates in her diet to help decrease the risk of diabetes. We dicussed metformin including benefits and risks. She was informed that eating too many simple carbohydrates or too many calories at one sitting increases the likelihood of GI side effects. Amy Mooney will continue Saxenda and follow up with [name] as directed to monitor her progress.  Diabetes risk counseling Amy Mooney was given extended (15 minutes) diabetes prevention counseling today. She is 53 y.o. female and has risk factors for diabetes including obesity and insulin resistance. We discussed intensive lifestyle modifications today with an emphasis on weight loss as well as increasing exercise and decreasing simple carbohydrates in her diet.  Obesity Amy Mooney is currently in the action stage of change. As  such, her goal is to continue with weight loss efforts She has agreed to follow the Category 2 plan Amy Mooney will continue physical therapy for her knee. We discussed the following Behavioral Modification Strategies today: planning for success  Amy Mooney agrees to increase Saxenda to 2.4 mg sub Q daily. We will refill Saxenda 3.0 mg #5 pens with no refills and patient is to take 2.4 mg daily  Amy Mooney has agreed to follow up with our clinic in 2 weeks. She was informed of the importance of frequent follow up visits to maximize her success with intensive lifestyle modifications for her multiple health conditions.  ALLERGIES: Allergies  Allergen Reactions  . Nuvigil [Armodafinil] Hives  . Penicillins Rash and Other (See Comments)    Has patient had a PCN reaction causing immediate rash, facial/tongue/throat swelling, SOB or lightheadedness with hypotension: Unknown Has patient had a PCN reaction causing severe rash involving mucus membranes or skin necrosis: Unknown Has patient had a PCN reaction that required hospitalization: Unknown Has patient had a PCN reaction occurring within the last 10 years: Unknown If all of the above answers are "NO", then may proceed with Cephalosporin use.   . Vancomycin Hives  . Lyrica [Pregabalin] Hives  . Bupropion Other (See Comments)    Causes migraines    MEDICATIONS: Current Outpatient Medications on File Prior to Visit  Medication Sig Dispense Refill  . Calcium Carb-Cholecalciferol (CALCIUM + D3 PO) Take 1 tablet by mouth at bedtime.    . Cholecalciferol (VITAMIN D3) 5000 units CAPS Take 5,000 Units by mouth at bedtime.     Marland Kitchen doxycycline (VIBRA-TABS) 100 MG tablet Take 100 mg by mouth daily as needed (acne).     . fluconazole (DIFLUCAN) 100 MG tablet Take 100 mg by mouth daily as needed (FINGER).     . Insulin Pen Needle (BD PEN NEEDLE NANO 2ND GEN) 32G X 4 MM MISC 1 Package by Does not apply route 2 (two) times daily. 100 each 0  . loratadine (CLARITIN) 10 MG tablet Take 10 mg by mouth daily as needed for allergies.    . methocarbamol (ROBAXIN) 500 MG tablet Take 1 tablet (500 mg total) by mouth every 6 (six) hours as needed for muscle spasms. 56 tablet 1  . Multiple Vitamins-Minerals (HAIR  SKIN AND NAILS FORMULA PO) Take 1 tablet by mouth daily.    . Multiple Vitamins-Minerals (MULTIVITAMIN WITH MINERALS) tablet Take 1 tablet by mouth daily.    . pramipexole (MIRAPEX) 0.25 MG tablet TAKE 1 TABLET BY MOUTH 30 MINUTES TO 1 HOUR PRIOR TO BEDTIME 90 tablet 0  . rizatriptan (MAXALT) 10 MG tablet TAKE 1 TABLET BY MOUTH AT MIGRAINE ONSET MAY REPEAT IN 2 HOURS IF HEADACHE PERSISTS.NOT EXCEED 2 TABLETS IN 24HRS 10 tablet 0  . valACYclovir (VALTREX) 1000 MG tablet Take 1,000 mg by mouth 2 (two) times daily as needed (fever blisters/cold sores.).     Marland Kitchen pregabalin (LYRICA) 50 MG capsule Take 1 capsule (50 mg total) by mouth 3 (three) times daily for 14 days. 42 capsule 0   No current facility-administered medications on file prior to visit.     PAST MEDICAL HISTORY: Past Medical History:  Diagnosis Date  . Chronic knee pain    arthritis  . Fever blister    history of  . History of kidney stones   . Hypertension   . Migraines   . Morbid obesity with BMI of 40.0-44.9, adult ([place])   . 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 Oral  herpes   . Osteoarthritis   . Restless leg   . Sleep apnea    per pt not treated; not able to use CPAP due to claustrophobia  . Vitamin D deficiency     PAST SURGICAL HISTORY: Past Surgical History:  Procedure Laterality Date  . ABDOMINAL HYSTERECTOMY  2010  . artroscopic meniscus repair Bilateral   . cervix removed, bladder tack     2015  . COLONOSCOPY WITH PROPOFOL N/A 06/19/2017   Procedure: COLONOSCOPY WITH PROPOFOL;  Surgeon: [name], MD;  Location: [facility];  Service: Endoscopy;  Laterality: N/A;  . LAPAROSCOPIC SALPINGO OOPHERECTOMY Right 03/22/2017   Procedure: LAPAROSCOPIC SALPINGO OOPHORECTOMY;  Surgeon: Ward, Honor Loh, MD;  Location: [facility] ORS;  Service: Gynecology;  Laterality: Right;  . TOTAL KNEE ARTHROPLASTY Right 10/29/2018   Procedure: TOTAL KNEE ARTHROPLASTY;  Surgeon: Carole Civil, MD;  Location: [facility];  Service:  Orthopedics;  Laterality: Right;  . TOTAL KNEE ARTHROPLASTY Left 05/27/2019   Procedure: TOTAL KNEE ARTHROPLASTY;  Surgeon: Carole Civil, MD;  Location: [facility];  Service: Orthopedics;  Laterality: Left;    SOCIAL HISTORY: Social History   Tobacco Use  . Smoking status: Former Smoker    Packs/day: 0.50    Years: 4.00    Pack years: 2.00    Quit date: 10/23/1986    Years since quitting: 32.7  . Smokeless tobacco: Never Used  Substance Use Topics  . Alcohol use: No  . Drug use: No    FAMILY HISTORY: Family History  Problem Relation Age of Onset  . Breast cancer Mother 59  . Diabetes Mother   . Depression Mother   . Anxiety disorder Mother   . Obesity Mother   . Breast cancer Paternal Grandmother     ROS: Review of Systems  Constitutional: Positive for weight loss.  Respiratory: Negative for shortness of breath (on exertion).   Cardiovascular: Negative for chest pain.  Gastrointestinal: Negative for constipation and nausea.  Genitourinary: Negative for frequency.  Endo/Heme/Allergies: Negative for polydipsia.       Positive for polyphagia    PHYSICAL EXAM: Blood pressure 121/79, pulse 70, temperature 98.5 F (36.9 C), temperature source Oral, height 5\' 3"  (1.6 m), weight 233 lb 3.2 oz (105.8 kg), SpO2 98 %. Body mass index is 41.31 kg/m. Physical Exam Vitals signs reviewed.  Constitutional:      Appearance: Normal appearance. She is well-developed. She is obese.  Cardiovascular:     Rate and Rhythm: Normal rate.  Pulmonary:     Effort: Pulmonary effort is normal.  Musculoskeletal: Normal range of motion.  Skin:    General: Skin is warm and dry.  Neurological:     Mental Status: She is alert and oriented to person, place, and time.  Psychiatric:        Mood and Affect: Mood normal.        Behavior: Behavior normal.     RECENT LABS AND TESTS: BMET    Component Value Date/Time   NA 139 05/28/2019 0603   NA 142 04/21/2019 1143   NA 137 05/23/2013  1515   K 3.4 (L) 05/28/2019 0603   K 3.9 05/23/2013 1515   CL 101 05/28/2019 0603   CL 104 05/23/2013 1515   CO2 27 05/28/2019 0603   CO2 35 (H) 05/23/2013 1515   GLUCOSE 160 (H) 05/28/2019 0603   GLUCOSE 68 05/23/2013 1515   BUN 14 05/28/2019 0603   BUN 21 04/21/2019 1143   BUN 10 05/23/2013  1515   CREATININE 0.71 05/28/2019 0603   CREATININE 0.86 05/23/2013 1515   CALCIUM 9.0 05/28/2019 0603   CALCIUM 8.4 (L) 05/23/2013 1515   GFRNONAA >60 05/28/2019 0603   GFRNONAA >60 05/23/2013 1515   GFRAA >60 05/28/2019 0603   GFRAA >60 05/23/2013 1515   Lab Results  Component Value Date   HGBA1C 5.6 04/21/2019   HGBA1C 5.6 12/17/2018   HGBA1C 5.7 (H) 08/19/2018   HGBA1C 5.9 06/03/2018   HGBA1C 5.8 04/27/2017   Lab Results  Component Value Date   INSULIN 22.8 04/21/2019   INSULIN 25.6 (H) 12/17/2018   INSULIN 29.0 (H) 08/19/2018   CBC    Component Value Date/Time   WBC 12.2 (H) 05/29/2019 0525   RBC 3.79 (L) 05/29/2019 0525   HGB 10.1 (L) 05/29/2019 0525   HGB 13.0 04/21/2019 1143   HCT 31.9 (L) 05/29/2019 0525   HCT 40.1 04/21/2019 1143   PLT 194 05/29/2019 0525   PLT 225 11/17/2016 1000   MCV 84.2 05/29/2019 0525   MCV 81 04/21/2019 1143   MCV 83 05/14/2013 1149   MCH 26.6 05/29/2019 0525   MCHC 31.7 05/29/2019 0525   RDW 15.3 05/29/2019 0525   RDW 15.6 (H) 04/21/2019 1143   RDW 14.1 05/14/2013 1149   LYMPHSABS 2.9 05/23/2019 1340   LYMPHSABS 2.4 04/21/2019 1143   MONOABS 0.5 05/23/2019 1340   EOSABS 0.2 05/23/2019 1340   EOSABS 0.2 04/21/2019 1143   BASOSABS 0.0 05/23/2019 1340   BASOSABS 0.1 04/21/2019 1143   Iron/TIBC/Ferritin/ %Sat    Component Value Date/Time   IRON 50 06/24/2014 1542   TIBC 328 06/24/2014 1542   FERRITIN 130 06/24/2014 1542   IRONPCTSAT 15 06/24/2014 1542   Lipid Panel     Component Value Date/Time   CHOL 171 12/17/2018 1034   TRIG 92 12/17/2018 1034   HDL 55 12/17/2018 1034   CHOLHDL 3 06/03/2018 0746   VLDL 28.0 06/03/2018  0746   LDLCALC 98 12/17/2018 1034   Hepatic Function Panel     Component Value Date/Time   PROT 7.4 04/21/2019 1143   ALBUMIN 4.5 04/21/2019 1143   AST 29 04/21/2019 1143   ALT 36 (H) 04/21/2019 1143   ALKPHOS 104 04/21/2019 1143   BILITOT 0.5 04/21/2019 1143      Component Value Date/Time   TSH 2.850 08/19/2018 1005   TSH 1.70 03/24/2016 1259     Ref. Range 04/21/2019 11:43  Vitamin D, 25-Hydroxy Latest Ref Range: 30.0 - 100.0 ng/mL 74.7   OBESITY BEHAVIORAL INTERVENTION VISIT  Today's visit was # 16   Starting weight: 244 lbs Starting date: 08/19/2018 Today's weight : 233 lbs  Today's date: 07/09/2019 Total lbs lost to date: 11    07/09/2019  Height 5\' 3"  (1.6 m)  Weight 233 lb 3.2 oz (105.8 kg)  BMI (Calculated) 41.32  BLOOD PRESSURE - SYSTOLIC [id number]  BLOOD PRESSURE - DIASTOLIC 79   Body Fat % 99991111 %    ASK: We discussed the diagnosis of obesity with Amy Mooney today and Amy Mooney agreed to give [name] permission to discuss obesity behavioral modification therapy today.  ASSESS: Amy Mooney has the diagnosis of obesity and her BMI today is 41.32 Amy Mooney is in the action stage of change   ADVISE: Amy Mooney was educated on the multiple health risks of obesity as well as the benefit of weight loss to improve her health. She was advised of the need for long term treatment and the importance of lifestyle  modifications to improve her current health and to decrease her risk of future health problems.  AGREE: Multiple dietary modification options and treatment options were discussed and  [place] agreed to follow the recommendations documented in the above note.  ARRANGE: [place] was educated on the importance of frequent visits to treat obesity as outlined per CMS and USPSTF guidelines and agreed to schedule her next follow up appointment today.  I, Doreene Nest, am acting as transcriptionist for [employer], FNP-C  I have reviewed the above documentation for accuracy and  completeness, and I agree with the above.  - Charvi Gammage, FNP-C.

## 2019-07-11 ENCOUNTER — Encounter: Payer: Self-pay | Admitting: Orthopedic Surgery

## 2019-07-11 ENCOUNTER — Other Ambulatory Visit: Payer: Self-pay

## 2019-07-11 ENCOUNTER — Ambulatory Visit (INDEPENDENT_AMBULATORY_CARE_PROVIDER_SITE_OTHER): Payer: 59 | Admitting: Orthopedic Surgery

## 2019-07-11 VITALS — BP 157/98 | HR 73 | Temp 97.3°F | Ht 63.0 in | Wt 234.0 lb

## 2019-07-11 DIAGNOSIS — Z96652 Presence of left artificial knee joint: Secondary | ICD-10-CM

## 2019-07-11 MED ORDER — CLINDAMYCIN HCL 150 MG PO CAPS: ORAL_CAPSULE | ORAL | 0 refills | Status: DC

## 2019-07-11 NOTE — Progress Notes (Signed)
Chief Complaint  Patient presents with  . Knee Pain    L/doing good   Left tka aug 4   45 days   Allergies  Allergen Reactions  . Nuvigil [Armodafinil] Hives  . Penicillins Rash and Other (See Comments)    Has patient had a PCN reaction causing immediate rash, facial/tongue/throat swelling, SOB or lightheadedness with hypotension: Unknown Has patient had a PCN reaction causing severe rash involving mucus membranes or skin necrosis: Unknown Has patient had a PCN reaction that required hospitalization: Unknown Has patient had a PCN reaction occurring within the last 10 years: Unknown If all of the above answers are "NO", then may proceed with Cephalosporin use.   . Vancomycin Hives  . Lyrica [Pregabalin] Hives  . Bupropion Other (See Comments)    Causes migraines   Amy Mooney is doing well she has some minor complaints her knee range of motion is 108 degrees at therapy.  Her wound looks good she has complains of some lower leg swelling but it is very mild and not associated with any calf edema pain tenderness or swelling.  She has some pain in her right knee status post total knee in January this seems to be quadriceps and anterior patellar tendon related  Recommend topical Voltaren gel continue physical therapy follow-up in 6 weeks

## 2019-07-11 NOTE — Patient Instructions (Signed)
4 HRS MORNING DESK AND 4 HRS SEEING PATIENTS IN AFTERNOON

## 2019-07-14 DIAGNOSIS — Z96652 Presence of left artificial knee joint: Secondary | ICD-10-CM | POA: Diagnosis not present

## 2019-07-14 DIAGNOSIS — M25561 Pain in right knee: Secondary | ICD-10-CM | POA: Diagnosis not present

## 2019-07-14 DIAGNOSIS — R2689 Other abnormalities of gait and mobility: Secondary | ICD-10-CM | POA: Diagnosis not present

## 2019-07-16 ENCOUNTER — Encounter (INDEPENDENT_AMBULATORY_CARE_PROVIDER_SITE_OTHER): Payer: Self-pay | Admitting: Family Medicine

## 2019-07-17 DIAGNOSIS — Z96652 Presence of left artificial knee joint: Secondary | ICD-10-CM | POA: Diagnosis not present

## 2019-07-17 DIAGNOSIS — M25561 Pain in right knee: Secondary | ICD-10-CM | POA: Diagnosis not present

## 2019-07-17 DIAGNOSIS — R2689 Other abnormalities of gait and mobility: Secondary | ICD-10-CM | POA: Diagnosis not present

## 2019-07-21 DIAGNOSIS — M25561 Pain in right knee: Secondary | ICD-10-CM | POA: Diagnosis not present

## 2019-07-21 DIAGNOSIS — Z96652 Presence of left artificial knee joint: Secondary | ICD-10-CM | POA: Diagnosis not present

## 2019-07-21 DIAGNOSIS — R2689 Other abnormalities of gait and mobility: Secondary | ICD-10-CM | POA: Diagnosis not present

## 2019-07-24 DIAGNOSIS — M25561 Pain in right knee: Secondary | ICD-10-CM | POA: Diagnosis not present

## 2019-07-24 DIAGNOSIS — Z96652 Presence of left artificial knee joint: Secondary | ICD-10-CM | POA: Diagnosis not present

## 2019-07-24 DIAGNOSIS — R2689 Other abnormalities of gait and mobility: Secondary | ICD-10-CM | POA: Diagnosis not present

## 2019-07-30 ENCOUNTER — Other Ambulatory Visit: Payer: Self-pay

## 2019-07-30 ENCOUNTER — Encounter (INDEPENDENT_AMBULATORY_CARE_PROVIDER_SITE_OTHER): Payer: Self-pay | Admitting: Family Medicine

## 2019-07-30 ENCOUNTER — Ambulatory Visit (INDEPENDENT_AMBULATORY_CARE_PROVIDER_SITE_OTHER): Payer: 59 | Admitting: Family Medicine

## 2019-07-30 VITALS — BP 138/71 | HR 75 | Temp 98.3°F | Ht 63.0 in | Wt 228.0 lb

## 2019-07-30 DIAGNOSIS — E8881 Metabolic syndrome: Secondary | ICD-10-CM

## 2019-07-30 DIAGNOSIS — Z6841 Body Mass Index (BMI) 40.0 and over, adult: Secondary | ICD-10-CM

## 2019-07-31 DIAGNOSIS — Z96652 Presence of left artificial knee joint: Secondary | ICD-10-CM | POA: Diagnosis not present

## 2019-07-31 DIAGNOSIS — M25561 Pain in right knee: Secondary | ICD-10-CM | POA: Diagnosis not present

## 2019-07-31 DIAGNOSIS — R2689 Other abnormalities of gait and mobility: Secondary | ICD-10-CM | POA: Diagnosis not present

## 2019-07-31 NOTE — Progress Notes (Signed)
Office: [phone]  /  Fax: [phone]   HPI:   Chief Complaint: OBESITY Amy Mooney is here to discuss her progress with her obesity treatment plan. She is on the Category 2 plan and is following her eating plan approximately 75% of the time. She states she is doing PT 60 minutes 2 times per week. Amy Mooney states she gets off track on weekends. She is on Saxenda 2.4 which she feels is working well for appetite suppression. She reports no constipation or nausea. She reports getting all protein in on the plan. Her weight is 228 lb (103.4 kg) today and has had a weight loss of 5 pounds over a period of 3 weeks since her last visit. She has lost 16 lbs since starting treatment with [name].  Insulin Resistance Amy Mooney has a diagnosis of insulin resistance based on her elevated fasting insulin level >5. Although Samaiyah's blood glucose readings are still under good control, insulin resistance puts her at greater risk of metabolic syndrome and diabetes. She is on Saxenda which is working well for appetite suppression. No polyphagia. Lab Results  Component Value Date   HGBA1C 5.6 04/21/2019    ASSESSMENT AND PLAN:  Insulin resistance  Class 3 severe obesity with serious comorbidity and body mass index (BMI) of 40.0 to 44.9 in adult, unspecified obesity type ([place])  PLAN:  Insulin Resistance Amy Mooney will continue to work on weight loss, exercise, and decreasing simple carbohydrates in her diet to help decrease the risk of diabetes.  Amy Mooney will continue Saxenda and her meal plan. She will follow-up as directed to monitor her progress.  Obesity Amy Mooney is currently in the action stage of change. As such, her goal is to continue with weight loss efforts. She has agreed to follow the Category 2 plan. Handouts on [employer] were sent via MyChart. Amy Mooney has been instructed to continue PT (had recent TKR) 2 times per week for weight loss and overall health benefits. We discussed the following  Behavioral Modification Stratagies today: planning for success.  Amy Mooney will stay on Saxenda 2.4 mg until her next office visit.  Amy Mooney has agreed to follow-up with our clinic in 3 weeks. She was informed of the importance of frequent follow-up visits to maximize her success with intensive lifestyle modifications for her multiple health conditions.  ALLERGIES: Allergies  Allergen Reactions  . Nuvigil [Armodafinil] Hives  . Penicillins Rash and Other (See Comments)    Has patient had a PCN reaction causing immediate rash, facial/tongue/throat swelling, SOB or lightheadedness with hypotension: Unknown Has patient had a PCN reaction causing severe rash involving mucus membranes or skin necrosis: Unknown Has patient had a PCN reaction that required hospitalization: Unknown Has patient had a PCN reaction occurring within the last 10 years: Unknown If all of the above answers are "NO", then may proceed with Cephalosporin use.   . Vancomycin Hives  . Lyrica [Pregabalin] Hives  . Bupropion Other (See Comments)    Causes migraines    MEDICATIONS: Current Outpatient Medications on File Prior to Visit  Medication Sig Dispense Refill  . Calcium Carb-Cholecalciferol (CALCIUM + D3 PO) Take 1 tablet by mouth at bedtime.    . Cholecalciferol (VITAMIN D3) 5000 units CAPS Take 5,000 Units by mouth at bedtime.     . clindamycin (CLEOCIN) 150 MG capsule 300 MG HALF HOUR BEFORE CLEANING 40 capsule 0  . doxycycline (VIBRA-TABS) 100 MG tablet Take 100 mg by mouth daily as needed (acne).     . fluconazole (DIFLUCAN) 100 MG  tablet Take 100 mg by mouth daily as needed (FINGER).     . hydrochlorothiazide (HYDRODIURIL) 25 MG tablet Take 1 tablet (25 mg total) by mouth daily. 30 tablet 0  . Insulin Pen Needle (BD PEN NEEDLE NANO 2ND GEN) 32G X 4 MM MISC 1 Package by Does not apply route 2 (two) times daily. 100 each 0  . Liraglutide -Weight Management (SAXENDA) 18 MG/3ML SOPN Inject 2.4 mg into the skin daily. 5  pen 0  . loratadine (CLARITIN) 10 MG tablet Take 10 mg by mouth daily as needed for allergies.    . methocarbamol (ROBAXIN) 500 MG tablet Take 1 tablet (500 mg total) by mouth every 6 (six) hours as needed for muscle spasms. (Patient not taking: Reported on 07/11/2019) 56 tablet 1  . Multiple Vitamins-Minerals (HAIR SKIN AND NAILS FORMULA PO) Take 1 tablet by mouth daily.    . Multiple Vitamins-Minerals (MULTIVITAMIN WITH MINERALS) tablet Take 1 tablet by mouth daily.    . pramipexole (MIRAPEX) 0.25 MG tablet TAKE 1 TABLET BY MOUTH 30 MINUTES TO 1 HOUR PRIOR TO BEDTIME 90 tablet 0  . pregabalin (LYRICA) 50 MG capsule Take 1 capsule (50 mg total) by mouth 3 (three) times daily for 14 days. 42 capsule 0  . rizatriptan (MAXALT) 10 MG tablet TAKE 1 TABLET BY MOUTH AT MIGRAINE ONSET MAY REPEAT IN 2 HOURS IF HEADACHE PERSISTS.NOT EXCEED 2 TABLETS IN 24HRS 10 tablet 0  . valACYclovir (VALTREX) 1000 MG tablet Take 1,000 mg by mouth 2 (two) times daily as needed (fever blisters/cold sores.).      No current facility-administered medications on file prior to visit.     PAST MEDICAL HISTORY: Past Medical History:  Diagnosis Date  . Chronic knee pain    arthritis  . Fever blister    history of  . History of kidney stones   . Hypertension   . Migraines   . Morbid obesity with BMI of 40.0-44.9, adult ([place])   . 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 Oral herpes   . Osteoarthritis   . Restless leg   . Sleep apnea    per pt not treated; not able to use CPAP due to claustrophobia  . Vitamin D deficiency     PAST SURGICAL HISTORY: Past Surgical History:  Procedure Laterality Date  . ABDOMINAL HYSTERECTOMY  2010  . artroscopic meniscus repair Bilateral   . cervix removed, bladder tack     2015  . COLONOSCOPY WITH PROPOFOL N/A 06/19/2017   Procedure: COLONOSCOPY WITH PROPOFOL;  Surgeon: [name], MD;  Location: [facility] ENDOSCOPY;  Service: Endoscopy;  Laterality: N/A;  . LAPAROSCOPIC SALPINGO  OOPHERECTOMY Right 03/22/2017   Procedure: LAPAROSCOPIC SALPINGO OOPHORECTOMY;  Surgeon: Ward, Honor Loh, MD;  Location: [facility] ORS;  Service: Gynecology;  Laterality: Right;  . TOTAL KNEE ARTHROPLASTY Right 10/29/2018   Procedure: TOTAL KNEE ARTHROPLASTY;  Surgeon: Carole Civil, MD;  Location: [facility] ORS;  Service: Orthopedics;  Laterality: Right;  . TOTAL KNEE ARTHROPLASTY Left 05/27/2019   Procedure: TOTAL KNEE ARTHROPLASTY;  Surgeon: Carole Civil, MD;  Location: [facility] ORS;  Service: Orthopedics;  Laterality: Left;    SOCIAL HISTORY: Social History   Tobacco Use  . Smoking status: Former Smoker    Packs/day: 0.50    Years: 4.00    Pack years: 2.00    Quit date: 10/23/1986    Years since quitting: 32.7  . Smokeless tobacco: Never Used  Substance Use Topics  . Alcohol use: No  .  Drug use: No    FAMILY HISTORY: Family History  Problem Relation Age of Onset  . Breast cancer Mother 66  . Diabetes Mother   . Depression Mother   . Anxiety disorder Mother   . Obesity Mother   . Breast cancer Paternal Grandmother    ROS: Review of Systems  Endo/Heme/Allergies:       Negative for polyphagia.   PHYSICAL EXAM: Blood pressure 138/71, pulse 75, temperature 98.3 F (36.8 C), temperature source Oral, height 5\' 3"  (1.6 m), weight 228 lb (103.4 kg), SpO2 98 %. Body mass index is 40.39 kg/m. Physical Exam Vitals signs reviewed.  Constitutional:      Appearance: Normal appearance. She is obese.  Cardiovascular:     Rate and Rhythm: Normal rate.     Pulses: Normal pulses.  Pulmonary:     Effort: Pulmonary effort is normal.     Breath sounds: Normal breath sounds.  Musculoskeletal: Normal range of motion.  Skin:    General: Skin is warm and dry.  Neurological:     Mental Status: She is alert and oriented to person, place, and time.  Psychiatric:        Behavior: Behavior normal.   RECENT LABS AND TESTS: BMET    Component Value Date/Time   NA 139 05/28/2019 0603   NA 142  04/21/2019 1143   NA 137 05/23/2013 1515   K 3.4 (L) 05/28/2019 0603   K 3.9 05/23/2013 1515   CL 101 05/28/2019 0603   CL 104 05/23/2013 1515   CO2 27 05/28/2019 0603   CO2 35 (H) 05/23/2013 1515   GLUCOSE 160 (H) 05/28/2019 0603   GLUCOSE 68 05/23/2013 1515   BUN 14 05/28/2019 0603   BUN 21 04/21/2019 1143   BUN 10 05/23/2013 1515   CREATININE 0.71 05/28/2019 0603   CREATININE 0.86 05/23/2013 1515   CALCIUM 9.0 05/28/2019 0603   CALCIUM 8.4 (L) 05/23/2013 1515   GFRNONAA >60 05/28/2019 0603   GFRNONAA >60 05/23/2013 1515   GFRAA >60 05/28/2019 0603   GFRAA >60 05/23/2013 1515   Lab Results  Component Value Date   HGBA1C 5.6 04/21/2019   HGBA1C 5.6 12/17/2018   HGBA1C 5.7 (H) 08/19/2018   HGBA1C 5.9 06/03/2018   HGBA1C 5.8 04/27/2017   Lab Results  Component Value Date   INSULIN 22.8 04/21/2019   INSULIN 25.6 (H) 12/17/2018   INSULIN 29.0 (H) 08/19/2018   CBC    Component Value Date/Time   WBC 12.2 (H) 05/29/2019 0525   RBC 3.79 (L) 05/29/2019 0525   HGB 10.1 (L) 05/29/2019 0525   HGB 13.0 04/21/2019 1143   HCT 31.9 (L) 05/29/2019 0525   HCT 40.1 04/21/2019 1143   PLT 194 05/29/2019 0525   PLT 225 11/17/2016 1000   MCV 84.2 05/29/2019 0525   MCV 81 04/21/2019 1143   MCV 83 05/14/2013 1149   MCH 26.6 05/29/2019 0525   MCHC 31.7 05/29/2019 0525   RDW 15.3 05/29/2019 0525   RDW 15.6 (H) 04/21/2019 1143   RDW 14.1 05/14/2013 1149   LYMPHSABS 2.9 05/23/2019 1340   LYMPHSABS 2.4 04/21/2019 1143   MONOABS 0.5 05/23/2019 1340   EOSABS 0.2 05/23/2019 1340   EOSABS 0.2 04/21/2019 1143   BASOSABS 0.0 05/23/2019 1340   BASOSABS 0.1 04/21/2019 1143   Iron/TIBC/Ferritin/ %Sat    Component Value Date/Time   IRON 50 06/24/2014 1542   TIBC 328 06/24/2014 1542   FERRITIN 130 06/24/2014 1542   IRONPCTSAT 15 06/24/2014  1542   Lipid Panel     Component Value Date/Time   CHOL 171 12/17/2018 1034   TRIG 92 12/17/2018 1034   HDL 55 12/17/2018 1034   CHOLHDL 3  06/03/2018 0746   VLDL 28.0 06/03/2018 0746   LDLCALC 98 12/17/2018 1034   Hepatic Function Panel     Component Value Date/Time   PROT 7.4 04/21/2019 1143   ALBUMIN 4.5 04/21/2019 1143   AST 29 04/21/2019 1143   ALT 36 (H) 04/21/2019 1143   ALKPHOS 104 04/21/2019 1143   BILITOT 0.5 04/21/2019 1143      Component Value Date/Time   TSH 2.850 08/19/2018 1005   TSH 1.70 03/24/2016 1259   Results for ADALEE, ABDO (MRN [id number]) as of 07/31/2019 07:11  Ref. Range 04/21/2019 11:43  Vitamin D, 25-Hydroxy Latest Ref Range: 30.0 - 100.0 ng/mL 74.7   OBESITY BEHAVIORAL INTERVENTION VISIT  Today's visit was #17  Starting weight: 244 lbs Starting date: 08/19/2018 Today's weight: 228 lbs  Today's date: 07/30/2019 Total lbs lost to date: 16    07/30/2019  Height 5\' 3"  (1.6 m)  Weight 228 lb (103.4 kg)  BMI (Calculated) 40.4  BLOOD PRESSURE - SYSTOLIC 0000000  BLOOD PRESSURE - DIASTOLIC 71   Body Fat % [id number] %   ASK: We discussed the diagnosis of obesity with Amy Mooney today and Amy Mooney agreed to give [name] permission to discuss obesity behavioral modification therapy today.  ASSESS: Amy Mooney has the diagnosis of obesity and her BMI today is 40.4. Amy Mooney is in the action stage of change.   ADVISE: Amy Mooney was educated on the multiple health risks of obesity as well as the benefit of weight loss to improve her health. She was advised of the need for long term treatment and the importance of lifestyle modifications to improve her current health and to decrease her risk of future health problems.  AGREE: Multiple dietary modification options and treatment options were discussed and  Amy Mooney agreed to follow the recommendations documented in the above note.  ARRANGE: Amy Mooney was educated on the importance of frequent visits to treat obesity as outlined per CMS and USPSTF guidelines and agreed to schedule her next follow up appointment today.  IMichaelene Mooney, am acting as Location manager  for [employer], FNP  I have reviewed the above documentation for accuracy and completeness, and I agree with the above.  - Hampton Cost, FNP-C.

## 2019-08-04 ENCOUNTER — Other Ambulatory Visit: Payer: Self-pay | Admitting: Primary Care

## 2019-08-04 ENCOUNTER — Encounter (INDEPENDENT_AMBULATORY_CARE_PROVIDER_SITE_OTHER): Payer: Self-pay | Admitting: Family Medicine

## 2019-08-04 DIAGNOSIS — D649 Anemia, unspecified: Secondary | ICD-10-CM

## 2019-08-04 DIAGNOSIS — E7849 Other hyperlipidemia: Secondary | ICD-10-CM

## 2019-08-04 DIAGNOSIS — E559 Vitamin D deficiency, unspecified: Secondary | ICD-10-CM

## 2019-08-04 DIAGNOSIS — I10 Essential (primary) hypertension: Secondary | ICD-10-CM

## 2019-08-05 DIAGNOSIS — M25561 Pain in right knee: Secondary | ICD-10-CM | POA: Diagnosis not present

## 2019-08-05 DIAGNOSIS — R2689 Other abnormalities of gait and mobility: Secondary | ICD-10-CM | POA: Diagnosis not present

## 2019-08-05 DIAGNOSIS — Z96652 Presence of left artificial knee joint: Secondary | ICD-10-CM | POA: Diagnosis not present

## 2019-08-06 ENCOUNTER — Ambulatory Visit (INDEPENDENT_AMBULATORY_CARE_PROVIDER_SITE_OTHER): Payer: 59

## 2019-08-06 ENCOUNTER — Other Ambulatory Visit (INDEPENDENT_AMBULATORY_CARE_PROVIDER_SITE_OTHER): Payer: Self-pay | Admitting: Nurse Practitioner

## 2019-08-06 ENCOUNTER — Ambulatory Visit (INDEPENDENT_AMBULATORY_CARE_PROVIDER_SITE_OTHER): Payer: 59 | Admitting: Nurse Practitioner

## 2019-08-06 ENCOUNTER — Other Ambulatory Visit: Payer: Self-pay

## 2019-08-06 ENCOUNTER — Encounter (INDEPENDENT_AMBULATORY_CARE_PROVIDER_SITE_OTHER): Payer: Self-pay | Admitting: Nurse Practitioner

## 2019-08-06 VITALS — BP 163/84 | HR 74 | Resp 16 | Ht 63.0 in | Wt 232.4 lb

## 2019-08-06 DIAGNOSIS — I83813 Varicose veins of bilateral lower extremities with pain: Secondary | ICD-10-CM | POA: Diagnosis not present

## 2019-08-06 DIAGNOSIS — I10 Essential (primary) hypertension: Secondary | ICD-10-CM | POA: Diagnosis not present

## 2019-08-06 DIAGNOSIS — I83893 Varicose veins of bilateral lower extremities with other complications: Secondary | ICD-10-CM

## 2019-08-06 DIAGNOSIS — M1712 Unilateral primary osteoarthritis, left knee: Secondary | ICD-10-CM

## 2019-08-07 ENCOUNTER — Telehealth: Payer: Self-pay

## 2019-08-07 NOTE — Telephone Encounter (Signed)
LVM w COVID screen, back lab and front door info

## 2019-08-08 DIAGNOSIS — Z96652 Presence of left artificial knee joint: Secondary | ICD-10-CM | POA: Diagnosis not present

## 2019-08-08 DIAGNOSIS — M25561 Pain in right knee: Secondary | ICD-10-CM | POA: Diagnosis not present

## 2019-08-08 DIAGNOSIS — R2689 Other abnormalities of gait and mobility: Secondary | ICD-10-CM | POA: Diagnosis not present

## 2019-08-11 ENCOUNTER — Other Ambulatory Visit (INDEPENDENT_AMBULATORY_CARE_PROVIDER_SITE_OTHER): Payer: 59

## 2019-08-11 ENCOUNTER — Encounter (INDEPENDENT_AMBULATORY_CARE_PROVIDER_SITE_OTHER): Payer: Self-pay | Admitting: Nurse Practitioner

## 2019-08-11 ENCOUNTER — Other Ambulatory Visit: Payer: Self-pay

## 2019-08-11 DIAGNOSIS — I10 Essential (primary) hypertension: Secondary | ICD-10-CM | POA: Diagnosis not present

## 2019-08-11 DIAGNOSIS — D649 Anemia, unspecified: Secondary | ICD-10-CM | POA: Diagnosis not present

## 2019-08-11 DIAGNOSIS — E7849 Other hyperlipidemia: Secondary | ICD-10-CM

## 2019-08-11 DIAGNOSIS — E559 Vitamin D deficiency, unspecified: Secondary | ICD-10-CM | POA: Diagnosis not present

## 2019-08-11 DIAGNOSIS — I83813 Varicose veins of bilateral lower extremities with pain: Secondary | ICD-10-CM | POA: Insufficient documentation

## 2019-08-11 LAB — CBC
HCT: 37.6 % (ref 36.0–46.0)
Hemoglobin: 12.2 g/dL (ref 12.0–15.0)
MCHC: 32.4 g/dL (ref 30.0–36.0)
MCV: 81 fl (ref 78.0–100.0)
Platelets: 228 10*3/uL (ref 150.0–400.0)
RBC: 4.64 Mil/uL (ref 3.87–5.11)
RDW: 14.5 % (ref 11.5–15.5)
WBC: 7.3 10*3/uL (ref 4.0–10.5)

## 2019-08-11 LAB — BASIC METABOLIC PANEL
BUN: 15 mg/dL (ref 6–23)
CO2: 28 mEq/L (ref 19–32)
Calcium: 9.6 mg/dL (ref 8.4–10.5)
Chloride: 105 mEq/L (ref 96–112)
Creatinine, Ser: 0.72 mg/dL (ref 0.40–1.20)
GFR: 84.69 mL/min (ref >60.00)
Glucose, Bld: 92 mg/dL (ref 70–99)
Potassium: 4 mEq/L (ref 3.5–5.1)
Sodium: 141 mEq/L (ref 135–145)

## 2019-08-11 LAB — LIPID PANEL
Cholesterol: 142 mg/dL (ref 0–200)
HDL: 39.2 mg/dL (ref >39.00)
LDL Cholesterol: 81 mg/dL (ref 0–99)
NonHDL: 102.37
Total CHOL/HDL Ratio: 4
Triglycerides: 108 mg/dL (ref 0.0–149.0)
VLDL: 21.6 mg/dL (ref 0.0–40.0)

## 2019-08-11 LAB — VITAMIN D 25 HYDROXY (VIT D DEFICIENCY, FRACTURES): VITD: 61.6 ng/mL (ref 30.00–100.00)

## 2019-08-11 NOTE — Progress Notes (Signed)
SUBJECTIVE:  Patient ID: Amy Mooney, female    DOB: May 25, 1966, 53 y.o.   MRN: [id number] Chief Complaint  Patient presents with   New Patient (Initial Visit)    ref Amy Mooney for varicose vein    HPI  Amy Mooney is a 53 y.o. female The patient returns for followup evaluation several years after her initial visit. The patient initially visited our office several years ago prior to our conversion to [place].  At that time, conservative therapy with compression socks, elevation, and exercise was effective.  Now the patient is experiencing worsening symptoms.  The patient continues to have pain in the lower extremities with dependency. The pain is lessened with elevation. Graduated compression stockings, Class I (20-30 mmHg), have been worn but the stockings do not eliminate the leg pain. Over-the-counter analgesics do not improve the symptoms. The degree of discomfort continues to interfere with daily activities. The patient notes the pain in the legs is causing problems with daily exercise, at the workplace and even with household activities and maintenance such as standing in the kitchen preparing meals and doing dishes.   Venous ultrasound shows reflux in the right common femoral vein, no evidence of acute or chronic DVT.  Superficial reflux is present in the bilateral great saphenous veins.    Past Medical History:  Diagnosis Date   Chronic knee pain    arthritis   Fever blister    history of   History of kidney stones    Hypertension    Migraines    Morbid obesity with BMI of 40.0-44.9, adult ([facility])    Narcolepsy without cataplexy(347.00)    Oral herpes    Osteoarthritis    Restless leg    Sleep apnea    per pt not treated; not able to use CPAP due to claustrophobia   Vitamin D deficiency     Past Surgical History:  Procedure Laterality Date   ABDOMINAL HYSTERECTOMY  2010   artroscopic meniscus repair Bilateral    cervix removed, bladder tack     2015    COLONOSCOPY WITH PROPOFOL N/A 06/19/2017   Procedure: COLONOSCOPY WITH PROPOFOL;  Surgeon: [name], MD;  Location: [facility] ENDOSCOPY;  Service: Endoscopy;  Laterality: N/A;   LAPAROSCOPIC SALPINGO OOPHERECTOMY Right 03/22/2017   Procedure: LAPAROSCOPIC SALPINGO OOPHORECTOMY;  Surgeon: Ward, Honor Loh, MD;  Location: [facility] ORS;  Service: Gynecology;  Laterality: Right;   TOTAL KNEE ARTHROPLASTY Right 10/29/2018   Procedure: TOTAL KNEE ARTHROPLASTY;  Surgeon: Carole Civil, MD;  Location: [facility];  Service: Orthopedics;  Laterality: Right;   TOTAL KNEE ARTHROPLASTY Left 05/27/2019   Procedure: TOTAL KNEE ARTHROPLASTY;  Surgeon: Carole Civil, MD;  Location: [facility];  Service: Orthopedics;  Laterality: Left;    Social History   Socioeconomic History   Marital status: Married    Spouse name: Amy Mooney   Number of children: 2   Years of education: [employer] education level: Not on file  Occupational History   Occupation: CMA    Employer: [employer]   Financial resource strain: Not on file   Food insecurity    Worry: Not on file    Inability: Not on file   Transportation needs    Medical: Not on file    Non-medical: Not on file  Tobacco Use   Smoking status: Former Smoker    Packs/day: 0.50    Years: 4.00    Pack years: 2.00  Quit date: 10/23/1986    Years since quitting: 32.8   Smokeless tobacco: Never Used  Substance and Sexual Activity   Alcohol use: No   Drug use: No   Sexual activity: Yes    Birth control/protection: None  Lifestyle   Physical activity    Days per week: Not on file    Minutes per session: Not on file   Stress: Not on file  Relationships   Social connections    Talks on phone: Not on file    Gets together: Not on file    Attends religious service: Not on file    Active member of club or organization: Not on file    Attends meetings of clubs or organizations: Not on file    Relationship  status: Not on file   Intimate partner violence    Fear of current or ex partner: Not on file    Emotionally abused: Not on file    Physically abused: Not on file    Forced sexual activity: Not on file  Other Topics Concern   Not on file  Social History Narrative   Patient lives at home with her husband Amy Mooney)   Patient has two adult children.   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   Patient has an college education.   Patient is right-handed.   Patient drinks very little caffeine.   Enjoys spending time with family.     Family History  Problem Relation Age of Onset   Breast cancer Mother 38   Diabetes Mother    Depression Mother    Anxiety disorder Mother    Obesity Mother    Breast cancer Paternal Grandmother     Allergies  Allergen Reactions   Nuvigil [Armodafinil] Hives   Penicillins Rash and Other (See Comments)    Has patient had a PCN reaction causing immediate rash, facial/tongue/throat swelling, SOB or lightheadedness with hypotension: Unknown Has patient had a PCN reaction causing severe rash involving mucus membranes or skin necrosis: Unknown Has patient had a PCN reaction that required hospitalization: Unknown Has patient had a PCN reaction occurring within the last 10 years: Unknown If all of the above answers are "NO", then may proceed with Cephalosporin use.    Vancomycin Hives   Lyrica [Pregabalin] Hives   Bupropion Other (See Comments)    Causes migraines     Review of Systems   Review of Systems: Negative Unless Checked Constitutional: [] Weight loss  [] Fever  [] Chills Cardiac: [] Chest pain   []  Atrial Fibrillation  [] Palpitations   [] Shortness of breath when laying flat   [] Shortness of breath with exertion. [] Shortness of breath at rest Vascular:  [] Pain in legs with walking   [] Pain in legs with standing [] Pain in legs when laying flat   [] Claudication    [] Pain in feet when laying flat    [] History of DVT   [] Phlebitis    [] Swelling in legs   [x] Varicose veins   [] Non-healing ulcers Pulmonary:   [] Uses home oxygen   [] Productive cough   [] Hemoptysis   [] Wheeze  [] COPD   [] Asthma Neurologic:  [] Dizziness   [] Seizures  [] Blackouts [] History of stroke   [] History of TIA  [] Aphasia   [] Temporary Blindness   [] Weakness or numbness in arm   [] Weakness or numbness in leg Musculoskeletal:   [] Joint swelling   [] Joint pain   [] Low back pain  [x]  History of Knee Replacement [x] Arthritis [] back Surgeries  []  Spinal Stenosis    Hematologic:  []   Easy bruising  [] Easy bleeding   [] Hypercoagulable state   [] Anemic Gastrointestinal:  [] Diarrhea   [] Vomiting  [] Gastroesophageal reflux/heartburn   [] Difficulty swallowing. [] Abdominal pain Genitourinary:  [] Chronic kidney disease   [] Difficult urination  [] Anuric   [] Blood in urine [] Frequent urination  [] Burning with urination   [] Hematuria Skin:  [] Rashes   [] Ulcers [] Wounds Psychological:  [] History of anxiety   []  History of major depression  []  Memory Difficulties      OBJECTIVE:   Physical Exam  BP (!) 163/84 (BP Location: Right Arm)    Pulse 74    Resp 16    Ht 5\' 3"  (1.6 m)    Wt 232 lb 6.4 oz (105.4 kg)    BMI 41.17 kg/m   Gen: WD/WN, NAD Head: [place]/AT, No temporalis wasting.  Ear/Nose/Throat: Hearing grossly intact, nares w/o erythema or drainage Eyes: PER, EOMI, sclera nonicteric.  Neck: Supple, no masses.  No JVD.  Pulmonary:  Good air movement, no use of accessory muscles.  Cardiac: RRR Vascular: Scattered varicosities bilaterally. Bilateral stasis dermatitis Vessel Right Left  Radial Palpable Palpable  Dorsalis Pedis Palpable Palpable  Posterior Tibial Palpable Palpable   Gastrointestinal: soft, non-distended. No guarding/no peritoneal signs.  Musculoskeletal: M/S 5/5 throughout.  No deformity or atrophy.  Neurologic: Pain and light touch intact in extremities.  Symmetrical.  Speech is fluent. Motor exam as listed above. Psychiatric: Judgment intact, Mood &  affect appropriate for pt's clinical situation. Dermatologic: No Ulcers Noted.  No changes consistent with cellulitis. Lymph : No Cervical lymphadenopathy, no lichenification or skin changes of chronic lymphedema.       ASSESSMENT AND PLAN:  1. Varicose veins of lower extremity with pain, bilateral Recommend  I have reviewed my previous  discussion with the patient regarding  varicose veins and why they cause symptoms. Patient will continue  wearing graduated compression stockings class 1 on a daily basis, beginning first thing in the morning and removing them in the evening.    In addition, behavioral modification including elevation during the day was again discussed and this will continue.  The patient has utilized over the counter pain medications and has been exercising.  However, at this time conservative therapy has not alleviated the patient's symptoms of leg pain and swelling  Recommend: laser ablation of the right and  left great saphenous veins to eliminate the symptoms of pain and swelling of the lower extremities caused by the severe superficial venous reflux disease.   2. Primary osteoarthritis of left knee Continue NSAID medications as already ordered, these medications have been reviewed and there are no changes at this time.  Continued activity and therapy was stressed.   3. Essential hypertension Continue antihypertensive medications as already ordered, these medications have been reviewed and there are no changes at this time.    Current Outpatient Medications on File Prior to Visit  Medication Sig Dispense Refill   Calcium Carb-Cholecalciferol (CALCIUM + D3 PO) Take 1 tablet by mouth at bedtime.     Cholecalciferol (VITAMIN D3) 5000 units CAPS Take 5,000 Units by mouth at bedtime.      doxycycline (VIBRA-TABS) 100 MG tablet Take 100 mg by mouth daily as needed (acne).      fluconazole (DIFLUCAN) 100 MG tablet Take 100 mg by mouth daily as needed (FINGER).       hydrochlorothiazide (HYDRODIURIL) 25 MG tablet Take 1 tablet (25 mg total) by mouth daily. 30 tablet 0   Insulin Pen Needle (BD PEN NEEDLE NANO 2ND GEN)  32G X 4 MM MISC 1 Package by Does not apply route 2 (two) times daily. 100 each 0   Liraglutide -Weight Management (SAXENDA) 18 MG/3ML SOPN Inject 2.4 mg into the skin daily. 5 pen 0   loratadine (CLARITIN) 10 MG tablet Take 10 mg by mouth daily as needed for allergies.     Multiple Vitamins-Minerals (HAIR SKIN AND NAILS FORMULA PO) Take 1 tablet by mouth daily.     Multiple Vitamins-Minerals (MULTIVITAMIN WITH MINERALS) tablet Take 1 tablet by mouth daily.     pramipexole (MIRAPEX) 0.25 MG tablet TAKE 1 TABLET BY MOUTH 30 MINUTES TO 1 HOUR PRIOR TO BEDTIME 90 tablet 0   rizatriptan (MAXALT) 10 MG tablet TAKE 1 TABLET BY MOUTH AT MIGRAINE ONSET MAY REPEAT IN 2 HOURS IF HEADACHE PERSISTS.NOT EXCEED 2 TABLETS IN 24HRS 10 tablet 0   valACYclovir (VALTREX) 1000 MG tablet Take 1,000 mg by mouth 2 (two) times daily as needed (fever blisters/cold sores.).      clindamycin (CLEOCIN) 150 MG capsule 300 MG HALF HOUR BEFORE CLEANING 40 capsule 0   methocarbamol (ROBAXIN) 500 MG tablet Take 1 tablet (500 mg total) by mouth every 6 (six) hours as needed for muscle spasms. (Patient not taking: Reported on 07/11/2019) 56 tablet 1   pregabalin (LYRICA) 50 MG capsule Take 1 capsule (50 mg total) by mouth 3 (three) times daily for 14 days. 42 capsule 0   No current facility-administered medications on file prior to visit.     There are no Patient Instructions on file for this visit. No follow-ups on file.   Kris Hartmann, NP  This note was completed with Sales executive.  Any errors are purely unintentional.

## 2019-08-13 DIAGNOSIS — Z96652 Presence of left artificial knee joint: Secondary | ICD-10-CM | POA: Diagnosis not present

## 2019-08-13 DIAGNOSIS — R2689 Other abnormalities of gait and mobility: Secondary | ICD-10-CM | POA: Diagnosis not present

## 2019-08-13 DIAGNOSIS — M25561 Pain in right knee: Secondary | ICD-10-CM | POA: Diagnosis not present

## 2019-08-15 DIAGNOSIS — Z96652 Presence of left artificial knee joint: Secondary | ICD-10-CM | POA: Diagnosis not present

## 2019-08-15 DIAGNOSIS — R2689 Other abnormalities of gait and mobility: Secondary | ICD-10-CM | POA: Diagnosis not present

## 2019-08-15 DIAGNOSIS — M25561 Pain in right knee: Secondary | ICD-10-CM | POA: Diagnosis not present

## 2019-08-18 ENCOUNTER — Other Ambulatory Visit: Payer: Self-pay

## 2019-08-18 ENCOUNTER — Ambulatory Visit (INDEPENDENT_AMBULATORY_CARE_PROVIDER_SITE_OTHER): Payer: 59 | Admitting: Primary Care

## 2019-08-18 ENCOUNTER — Encounter: Payer: Self-pay | Admitting: Primary Care

## 2019-08-18 VITALS — BP 126/84 | HR 82 | Temp 97.9°F | Ht 62.5 in | Wt 230.5 lb

## 2019-08-18 DIAGNOSIS — Z1231 Encounter for screening mammogram for malignant neoplasm of breast: Secondary | ICD-10-CM

## 2019-08-18 DIAGNOSIS — G2581 Restless legs syndrome: Secondary | ICD-10-CM | POA: Diagnosis not present

## 2019-08-18 DIAGNOSIS — E7849 Other hyperlipidemia: Secondary | ICD-10-CM | POA: Diagnosis not present

## 2019-08-18 DIAGNOSIS — Z0000 Encounter for general adult medical examination without abnormal findings: Secondary | ICD-10-CM

## 2019-08-18 DIAGNOSIS — K635 Polyp of colon: Secondary | ICD-10-CM | POA: Diagnosis not present

## 2019-08-18 DIAGNOSIS — E559 Vitamin D deficiency, unspecified: Secondary | ICD-10-CM | POA: Diagnosis not present

## 2019-08-18 DIAGNOSIS — G43009 Migraine without aura, not intractable, without status migrainosus: Secondary | ICD-10-CM

## 2019-08-18 DIAGNOSIS — I10 Essential (primary) hypertension: Secondary | ICD-10-CM

## 2019-08-18 DIAGNOSIS — Z6841 Body Mass Index (BMI) 40.0 and over, adult: Secondary | ICD-10-CM

## 2019-08-18 DIAGNOSIS — M1712 Unilateral primary osteoarthritis, left knee: Secondary | ICD-10-CM | POA: Diagnosis not present

## 2019-08-18 DIAGNOSIS — D649 Anemia, unspecified: Secondary | ICD-10-CM

## 2019-08-18 DIAGNOSIS — E66813 Obesity, class 3: Secondary | ICD-10-CM

## 2019-08-18 MED ORDER — PRAMIPEXOLE DIHYDROCHLORIDE 0.25 MG PO TABS: ORAL_TABLET | ORAL | 3 refills | Status: DC

## 2019-08-18 NOTE — Assessment & Plan Note (Signed)
Improved since knee replacement in August 2020. Doing well and following with orthopedics.

## 2019-08-18 NOTE — Assessment & Plan Note (Signed)
Vitamin D level stable, continue vitamin D.

## 2019-08-18 NOTE — Assessment & Plan Note (Signed)
Following with healthy weight and wellness, compliant to [place].

## 2019-08-18 NOTE — Patient Instructions (Addendum)
Start exercising. You should be getting 150 minutes of moderate intensity exercise weekly.  Continue to work on a healthy diet. Ensure you are consuming 64 ounces of water daily.  Call the breast center to schedule your mammogram.  It was a pleasure to see you today!   Preventive Care 83-53 Years Old, Female Preventive care refers to visits with your health care provider and lifestyle choices that can promote health and wellness. This includes:  A yearly physical exam. This may also be called an annual well check.  Regular dental visits and eye exams.  Immunizations.  Screening for certain conditions.  Healthy lifestyle choices, such as eating a healthy diet, getting regular exercise, not using drugs or products that contain nicotine and tobacco, and limiting alcohol use. What can I expect for my preventive care visit? Physical exam Your health care provider will check your:  Height and weight. This may be used to calculate body mass index (BMI), which tells if you are at a healthy weight.  Heart rate and blood pressure.  Skin for abnormal spots. Counseling Your health care provider may ask you questions about your:  Alcohol, tobacco, and drug use.  Emotional well-being.  Home and relationship well-being.  Sexual activity.  Eating habits.  Work and work Statistician.  Method of birth control.  Menstrual cycle.  Pregnancy history. What immunizations do I need?  Influenza (flu) vaccine  This is recommended every year. Tetanus, diphtheria, and pertussis (Tdap) vaccine  You may need a Td booster every 10 years. Varicella (chickenpox) vaccine  You may need this if you have not been vaccinated. Zoster (shingles) vaccine  You may need this after age 39. Measles, mumps, and rubella (MMR) vaccine  You may need at least one dose of MMR if you were born in 1957 or later. You may also need a second dose. Pneumococcal conjugate (PCV13) vaccine  You may need this  if you have certain conditions and were not previously vaccinated. Pneumococcal polysaccharide (PPSV23) vaccine  You may need one or two doses if you smoke cigarettes or if you have certain conditions. Meningococcal conjugate (MenACWY) vaccine  You may need this if you have certain conditions. Hepatitis A vaccine  You may need this if you have certain conditions or if you travel or work in places where you may be exposed to hepatitis A. Hepatitis B vaccine  You may need this if you have certain conditions or if you travel or work in places where you may be exposed to hepatitis B. Haemophilus influenzae type b (Hib) vaccine  You may need this if you have certain conditions. Human papillomavirus (HPV) vaccine  If recommended by your health care provider, you may need three doses over 6 months. You may receive vaccines as individual doses or as more than one vaccine together in one shot (combination vaccines). Talk with your health care provider about the risks and benefits of combination vaccines. What tests do I need? Blood tests  Lipid and cholesterol levels. These may be checked every 5 years, or more frequently if you are over 61 years old.  Hepatitis C test.  Hepatitis B test. Screening  Lung cancer screening. You may have this screening every year starting at age 63 if you have a 30-pack-year history of smoking and currently smoke or have quit within the past 15 years.  Colorectal cancer screening. All adults should have this screening starting at age 61 and continuing until age 56. Your health care provider may recommend screening at  age 88 if you are at increased risk. You will have tests every 1-10 years, depending on your results and the type of screening test.  Diabetes screening. This is done by checking your blood sugar (glucose) after you have not eaten for a while (fasting). You may have this done every 1-3 years.  Mammogram. This may be done every 1-2 years. Talk  with your health care provider about when you should start having regular mammograms. This may depend on whether you have a family history of breast cancer.  BRCA-related cancer screening. This may be done if you have a family history of breast, ovarian, tubal, or peritoneal cancers.  Pelvic exam and Pap test. This may be done every 3 years starting at age 63. Starting at age 75, this may be done every 5 years if you have a Pap test in combination with an HPV test. Other tests  Sexually transmitted disease (STD) testing.  Bone density scan. This is done to screen for osteoporosis. You may have this scan if you are at high risk for osteoporosis. Follow these instructions at home: Eating and drinking  Eat a diet that includes fresh fruits and vegetables, whole grains, lean protein, and low-fat dairy.  Take vitamin and mineral supplements as recommended by your health care provider.  Do not drink alcohol if: ? Your health care provider tells you not to drink. ? You are pregnant, may be pregnant, or are planning to become pregnant.  If you drink alcohol: ? Limit how much you have to 0-1 drink a day. ? Be aware of how much alcohol is in your drink. In the [place] one drink equals one 12 oz bottle of beer (355 mL), one 5 oz glass of wine (148 mL), or one 1 oz glass of hard liquor (44 mL). Lifestyle  Take daily care of your teeth and gums.  Stay active. Exercise for at least 30 minutes on 5 or more days each week.  Do not use any products that contain nicotine or tobacco, such as cigarettes, e-cigarettes, and chewing tobacco. If you need help quitting, ask your health care provider.  If you are sexually active, practice safe sex. Use a condom or other form of birth control (contraception) in order to prevent pregnancy and STIs (sexually transmitted infections).  If told by your health care provider, take low-dose aspirin daily starting at age 51. What's next?  Visit your health care  provider once a year for a well check visit.  Ask your health care provider how often you should have your eyes and teeth checked.  Stay up to date on all vaccines. This information is not intended to replace advice given to you by your health care provider. Make sure you discuss any questions you have with your health care provider. Document Released: 11/05/2015 Document Revised: 06/20/2018 Document Reviewed: 06/20/2018 Elsevier Patient Education  2020 [employer].

## 2019-08-18 NOTE — Assessment & Plan Note (Signed)
Immunizations UTD, declines Shingrix. Mammogram due, orders placed. Colonoscopy UTD, due in 2028. Commended her on weight loss efforts, encouraged to continue.  Exam today unremarkable. Labs reviewed.

## 2019-08-18 NOTE — Assessment & Plan Note (Signed)
Recent CBC unremarkable.  °

## 2019-08-18 NOTE — Assessment & Plan Note (Signed)
Stable in the office today, although has been out of medications for one week. Will have her resume and monitor BP, if readings are low then discontinue.

## 2019-08-18 NOTE — Progress Notes (Signed)
Subjective:    Patient ID: [name], female    DOB: [birth year], 53 y.o.   MRN: [id number]  HPI  Ms. [name] is a 53 year old female who presents today for complete physical.  Immunizations: -Tetanus: Completed in 2017 -Influenza: Completed this season  -Shingles: Never completed, declines   Diet: She endorses a fair diet. She is managed at healthy weight and wellness.  Exercise: She is not exercising.   Eye exam: Completed in 2020 Dental exam: Completes semi-annually   Pap Smear: Hysterectomy  Mammogram: Completed in 2019 Colonoscopy: Completed in 2018, due again in 2028  BP Readings from Last 3 Encounters:  08/18/19 126/84  08/06/19 (!) 163/84  07/30/19 138/71     Review of Systems  Constitutional: Negative for unexpected weight change.  HENT: Negative for rhinorrhea.   Respiratory: Negative for cough and shortness of breath.   Cardiovascular: Negative for chest pain.  Gastrointestinal: Negative for constipation and diarrhea.  Genitourinary: Negative for difficulty urinating.  Musculoskeletal: Positive for myalgias.       Restless/cramping legs at night  Skin: Negative for rash.  Allergic/Immunologic: Negative for environmental allergies.  Neurological: Negative for dizziness, numbness and headaches.  Psychiatric/Behavioral: The patient is not nervous/anxious.        Past Medical History:  Diagnosis Date  . Chronic knee pain    arthritis  . Fever blister    history of  . History of kidney stones   . Hypertension   . Migraines   . Morbid obesity with BMI of 40.0-44.9, adult ([place])   . 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 Oral herpes   . Osteoarthritis   . Restless leg   . Sleep apnea    per pt not treated; not able to use CPAP due to claustrophobia  . Vitamin D deficiency      Social History   Socioeconomic History  . Marital status: Married    Spouse name: Jahmiah Peek  . Number of children: 2  . Years of education: College  . Highest  education level: Not on file  Occupational History  . Occupation: CMA    Employer: [employer]  Social Needs  . Financial resource strain: Not on file  . Food insecurity    Worry: Not on file    Inability: Not on file  . Transportation needs    Medical: Not on file    Non-medical: Not on file  Tobacco Use  . Smoking status: Former Smoker    Packs/day: 0.50    Years: 4.00    Pack years: 2.00    Quit date: 10/23/1986    Years since quitting: 32.8  . Smokeless tobacco: Never Used  Substance and Sexual Activity  . Alcohol use: No  . Drug use: No  . Sexual activity: Yes    Birth control/protection: None  Lifestyle  . Physical activity    Days per week: Not on file    Minutes per session: Not on file  . Stress: Not on file  Relationships  . Social Herbalist on phone: Not on file    Gets together: Not on file    Attends religious service: Not on file    Active member of club or organization: Not on file    Attends meetings of clubs or organizations: Not on file    Relationship status: Not on file  . Intimate partner violence    Fear of current or ex partner: Not on file  Emotionally abused: Not on file    Physically abused: Not on file    Forced sexual activity: Not on file  Other Topics Concern  . Not on file  Social History Narrative   [name])   Patient has two adult children.   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   Patient has an college education.   Patient is right-handed.   Patient drinks very little caffeine.   Enjoys spending time with family.     Past Surgical History:  Procedure Laterality Date  . ABDOMINAL HYSTERECTOMY  2010  . artroscopic meniscus repair Bilateral   . cervix removed, bladder tack     2015  . COLONOSCOPY WITH PROPOFOL N/A 06/19/2017   Procedure: COLONOSCOPY WITH PROPOFOL;  Surgeon: [name], MD;  Location: [facility] ENDOSCOPY;  Service: Endoscopy;  Laterality: N/A;   . LAPAROSCOPIC SALPINGO OOPHERECTOMY Right 03/22/2017   Procedure: LAPAROSCOPIC SALPINGO OOPHORECTOMY;  Surgeon: Ward, Honor Loh, MD;  Location: [facility] ORS;  Service: Gynecology;  Laterality: Right;  . TOTAL KNEE ARTHROPLASTY Right 10/29/2018   Procedure: TOTAL KNEE ARTHROPLASTY;  Surgeon: Carole Civil, MD;  Location: [facility] ORS;  Service: Orthopedics;  Laterality: Right;  . TOTAL KNEE ARTHROPLASTY Left 05/27/2019   Procedure: TOTAL KNEE ARTHROPLASTY;  Surgeon: Carole Civil, MD;  Location: [facility] ORS;  Service: Orthopedics;  Laterality: Left;    Family History  Problem Relation Age of Onset  . Breast cancer Mother 61  . Diabetes Mother   . Depression Mother   . Anxiety disorder Mother   . Obesity Mother   . Breast cancer Paternal Grandmother     Allergies  Allergen Reactions  . Nuvigil [Armodafinil] Hives  . Penicillins Rash and Other (See Comments)    Has patient had a PCN reaction causing immediate rash, facial/tongue/throat swelling, SOB or lightheadedness with hypotension: Unknown Has patient had a PCN reaction causing severe rash involving mucus membranes or skin necrosis: Unknown Has patient had a PCN reaction that required hospitalization: Unknown Has patient had a PCN reaction occurring within the last 10 years: Unknown If all of the above answers are "NO", then may proceed with Cephalosporin use.   . Vancomycin Hives  . Lyrica [Pregabalin] Hives  . Bupropion Other (See Comments)    Causes migraines    Current Outpatient Medications on File Prior to Visit  Medication Sig Dispense Refill  . Calcium Carb-Cholecalciferol (CALCIUM + D3 PO) Take 1 tablet by mouth at bedtime.    . Cholecalciferol (VITAMIN D3) 5000 units CAPS Take 5,000 Units by mouth at bedtime.     . clindamycin (CLEOCIN) 150 MG capsule 300 MG HALF HOUR BEFORE CLEANING 40 capsule 0  . doxycycline (VIBRA-TABS) 100 MG tablet Take 100 mg by mouth daily as needed (acne).     . hydrochlorothiazide (HYDRODIURIL)  25 MG tablet Take 1 tablet (25 mg total) by mouth daily. 30 tablet 0  . Insulin Pen Needle (BD PEN NEEDLE NANO 2ND GEN) 32G X 4 MM MISC 1 Package by Does not apply route 2 (two) times daily. 100 each 0  . Liraglutide -Weight Management (SAXENDA) 18 MG/3ML SOPN Inject 2.4 mg into the skin daily. 5 pen 0  . loratadine (CLARITIN) 10 MG tablet Take 10 mg by mouth daily as needed for allergies.    . methocarbamol (ROBAXIN) 500 MG tablet Take 1 tablet (500 mg total) by mouth every 6 (six) hours as needed for muscle spasms. 56 tablet 1  .  Multiple Vitamins-Minerals (HAIR SKIN AND NAILS FORMULA PO) Take 1 tablet by mouth daily.    . Multiple Vitamins-Minerals (MULTIVITAMIN WITH MINERALS) tablet Take 1 tablet by mouth daily.    . rizatriptan (MAXALT) 10 MG tablet TAKE 1 TABLET BY MOUTH AT MIGRAINE ONSET MAY REPEAT IN 2 HOURS IF HEADACHE PERSISTS.NOT EXCEED 2 TABLETS IN 24HRS 10 tablet 0  . valACYclovir (VALTREX) 1000 MG tablet Take 1,000 mg by mouth 2 (two) times daily as needed (fever blisters/cold sores.).     Marland Kitchen fluconazole (DIFLUCAN) 100 MG tablet Take 100 mg by mouth daily as needed (FINGER).      No current facility-administered medications on file prior to visit.     BP 126/84   Pulse 82   Temp 97.9 F (36.6 C) (Temporal)   Ht 5' 2.5" (1.588 m)   Wt 230 lb 8 oz (104.6 kg)   SpO2 98%   BMI 41.49 kg/m    Objective:   Physical Exam  Constitutional: She is oriented to person, place, and time. She appears well-nourished.  HENT:  Right Ear: Tympanic membrane and ear canal normal.  Left Ear: Tympanic membrane and ear canal normal.  Mouth/Throat: Oropharynx is clear and moist.  Eyes: Pupils are equal, round, and reactive to light. EOM are normal.  Neck: Neck supple.  Cardiovascular: Normal rate and regular rhythm.  Respiratory: Effort normal and breath sounds normal.  GI: Soft. Bowel sounds are normal. There is no abdominal tenderness.  Musculoskeletal: Normal range of motion.   Neurological: She is alert and oriented to person, place, and time.  Skin: Skin is warm and dry.  Psychiatric: She has a normal mood and affect.           Assessment & Plan:

## 2019-08-18 NOTE — Assessment & Plan Note (Signed)
Colonoscopy completed in 2018, due again in 2028.

## 2019-08-18 NOTE — Assessment & Plan Note (Signed)
Lipid panel under good control, continue weight loss efforts.

## 2019-08-18 NOTE — Assessment & Plan Note (Signed)
Infrequent, no recent use of Maxalt. Continue to monitor.

## 2019-08-18 NOTE — Assessment & Plan Note (Signed)
Increased symptoms since off meds for 2 months, refills provided today. She will update. Commended her on weight loss.

## 2019-08-19 DIAGNOSIS — M25561 Pain in right knee: Secondary | ICD-10-CM | POA: Diagnosis not present

## 2019-08-19 DIAGNOSIS — Z96652 Presence of left artificial knee joint: Secondary | ICD-10-CM | POA: Diagnosis not present

## 2019-08-19 DIAGNOSIS — R2689 Other abnormalities of gait and mobility: Secondary | ICD-10-CM | POA: Diagnosis not present

## 2019-08-20 ENCOUNTER — Encounter (INDEPENDENT_AMBULATORY_CARE_PROVIDER_SITE_OTHER): Payer: Self-pay | Admitting: Family Medicine

## 2019-08-20 ENCOUNTER — Other Ambulatory Visit: Payer: Self-pay

## 2019-08-20 ENCOUNTER — Telehealth (INDEPENDENT_AMBULATORY_CARE_PROVIDER_SITE_OTHER): Payer: 59 | Admitting: Family Medicine

## 2019-08-20 DIAGNOSIS — E559 Vitamin D deficiency, unspecified: Secondary | ICD-10-CM

## 2019-08-20 DIAGNOSIS — Z6841 Body Mass Index (BMI) 40.0 and over, adult: Secondary | ICD-10-CM

## 2019-08-20 DIAGNOSIS — E8881 Metabolic syndrome: Secondary | ICD-10-CM | POA: Diagnosis not present

## 2019-08-20 DIAGNOSIS — Z9189 Other specified personal risk factors, not elsewhere classified: Secondary | ICD-10-CM | POA: Diagnosis not present

## 2019-08-20 DIAGNOSIS — E88819 Insulin resistance, unspecified: Secondary | ICD-10-CM

## 2019-08-20 MED ORDER — SAXENDA 18 MG/3ML ~~LOC~~ SOPN: 3.0000 mg | PEN_INJECTOR | Freq: Every day | SUBCUTANEOUS | 0 refills | Status: DC

## 2019-08-20 MED ORDER — BD PEN NEEDLE NANO 2ND GEN 32G X 4 MM MISC: 0 refills | Status: DC

## 2019-08-20 NOTE — Progress Notes (Signed)
Office: [phone]  /  Fax: [phone] TeleHealth Visit:  [name] has verbally consented to this TeleHealth visit today. The patient is located at home, the provider is located at the [employer] [facility] office. The participants in this visit include the listed provider and patient. The visit was conducted today via Face Time.  HPI:   Chief Complaint: OBESITY Kasi is here to discuss her progress with her obesity treatment plan. She is on the Category 2 plan and is following her eating plan approximately 85 % of the time. She states she is exercising 0 minutes 0 times per week. Morayo reports that she has maintained her weight. She is currently on Saxenda 2.4 mg and she reports no side effects. She does report polyphagia at times despite taking Saxenda. Mahlet reports to sticking to less than 200 calories per day for the most part. She is getting in all of her protein and is not skipping meals.  We were unable to weigh the patient today for this TeleHealth visit. She feels as if she has maintained weight since her last visit. She has lost 16 lbs since starting treatment with [name].  Vitamin D Deficiency Graceanna has a diagnosis of vitamin D deficiency. She is currently on OTC 5,000 IU of vit D3, and is at goal. [facility] denies nausea, vomiting, or muscle weakness.  Insulin Resistance Bayler has a diagnosis of insulin resistance based on her elevated fasting insulin level >5. Although Jerome's blood glucose readings are still under good control, insulin resistance puts her at greater risk of metabolic syndrome and diabetes. She is not taking metformin currently and continues to work on diet and exercise to decrease risk of diabetes. Marchetta admits to polyphagia at times. Lab Results  Component Value Date   HGBA1C 5.6 04/21/2019    At risk for diabetes Khamya is at higher than average risk for developing diabetes due to her insulin resistance and obesity.   ASSESSMENT AND PLAN:   Vitamin D deficiency  Insulin resistance - Plan: Liraglutide -Weight Management (SAXENDA) 18 MG/3ML SOPN  At risk for diabetes mellitus  Class 3 severe obesity with serious comorbidity and body mass index (BMI) of 40.0 to 44.9 in adult, unspecified obesity type ([place]) - Plan: Insulin Pen Needle (BD PEN NEEDLE NANO 2ND GEN) 32G X 4 MM MISC  PLAN:  Vitamin D Deficiency Amaliya was informed that low vitamin D levels contribute to fatigue and are associated with obesity, breast, and colon cancer. Taurus agrees to continue to take OTC Vit D @5 ,000 IU daily with no refills and will follow up for routine testing of vitamin D, at least 2-3 times per year. She was informed of the risk of over-replacement of vitamin D and agrees to not increase her dose unless she discusses this with [name] first. Mallorie agrees to follow up in 3 weeks as directed.  Insulin Resistance Anysia will continue to work on weight loss, exercise, and decreasing simple carbohydrates in her diet to help decrease the risk of diabetes. She was informed that eating too many simple carbohydrates or too many calories at one sitting increases the likelihood of GI side effects. Mckayla agreed to increase her dose of Saxenda to 3.0 mg and prescription was written today. Chayse agreed to follow up with [name] as directed to monitor her progress.   Diabetes risk counseling Geneviene was given extended (15 minutes) diabetes prevention counseling today. She is 53 y.o. female and has risk factors for diabetes including insulin resistance and  obesity. We discussed intensive lifestyle modifications today with an emphasis on weight loss as well as increasing exercise and decreasing simple carbohydrates in her diet.  Obesity Maricela is currently in the action stage of change. As such, her goal is to continue with weight loss efforts. She has agreed to follow the Category 2 plan. Dezaria has been instructed to do physical therapy 2 times per week. We discussed  the following Behavioral Modification Strategies today: planning for success. We discussed various medication options to help [facility] with her weight loss efforts and we both agreed to increase Saxenda to 3.0 mg daily [place] #5 pens with needles #100 and no refills and she will follow up at the agreed upon time.  Kendria has agreed to follow up with our clinic in 3 weeks. She was informed of the importance of frequent follow up visits to maximize her success with intensive lifestyle modifications for her multiple health conditions.  ALLERGIES: Allergies  Allergen Reactions  . Nuvigil [Armodafinil] Hives  . Penicillins Rash and Other (See Comments)    Has patient had a PCN reaction causing immediate rash, facial/tongue/throat swelling, SOB or lightheadedness with hypotension: Unknown Has patient had a PCN reaction causing severe rash involving mucus membranes or skin necrosis: Unknown Has patient had a PCN reaction that required hospitalization: Unknown Has patient had a PCN reaction occurring within the last 10 years: Unknown If all of the above answers are "NO", then may proceed with Cephalosporin use.   . Vancomycin Hives  . Lyrica [Pregabalin] Hives  . Bupropion Other (See Comments)    Causes migraines    MEDICATIONS: Current Outpatient Medications on File Prior to Visit  Medication Sig Dispense Refill  . Calcium Carb-Cholecalciferol (CALCIUM + D3 PO) Take 1 tablet by mouth at bedtime.    . Cholecalciferol (VITAMIN D3) 5000 units CAPS Take 5,000 Units by mouth at bedtime.     . clindamycin (CLEOCIN) 150 MG capsule 300 MG HALF HOUR BEFORE CLEANING 40 capsule 0  . doxycycline (VIBRA-TABS) 100 MG tablet Take 100 mg by mouth daily as needed (acne).     . fluconazole (DIFLUCAN) 100 MG tablet Take 100 mg by mouth daily as needed (FINGER).     . hydrochlorothiazide (HYDRODIURIL) 25 MG tablet Take 1 tablet (25 mg total) by mouth daily. 30 tablet 0  . loratadine (CLARITIN) 10 MG tablet Take 10 mg  by mouth daily as needed for allergies.    . methocarbamol (ROBAXIN) 500 MG tablet Take 1 tablet (500 mg total) by mouth every 6 (six) hours as needed for muscle spasms. 56 tablet 1  . Multiple Vitamins-Minerals (HAIR SKIN AND NAILS FORMULA PO) Take 1 tablet by mouth daily.    . Multiple Vitamins-Minerals (MULTIVITAMIN WITH MINERALS) tablet Take 1 tablet by mouth daily.    . pramipexole (MIRAPEX) 0.25 MG tablet TAKE 1 TABLET BY MOUTH 30 MINUTES TO 1 HOUR PRIOR TO BEDTIME 90 tablet 3  . rizatriptan (MAXALT) 10 MG tablet TAKE 1 TABLET BY MOUTH AT MIGRAINE ONSET MAY REPEAT IN 2 HOURS IF HEADACHE PERSISTS.NOT EXCEED 2 TABLETS IN 24HRS 10 tablet 0  . valACYclovir (VALTREX) 1000 MG tablet Take 1,000 mg by mouth 2 (two) times daily as needed (fever blisters/cold sores.).      No current facility-administered medications on file prior to visit.     PAST MEDICAL HISTORY: Past Medical History:  Diagnosis Date  . Chronic knee pain    arthritis  . Fever blister    history of  .  History of kidney stones   . Hypertension   . Migraines   . Morbid obesity with BMI of 40.0-44.9, adult ([place])   . 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 Oral herpes   . Osteoarthritis   . Restless leg   . Sleep apnea    per pt not treated; not able to use CPAP due to claustrophobia  . Vitamin D deficiency     PAST SURGICAL HISTORY: Past Surgical History:  Procedure Laterality Date  . ABDOMINAL HYSTERECTOMY  2010  . artroscopic meniscus repair Bilateral   . cervix removed, bladder tack     2015  . COLONOSCOPY WITH PROPOFOL N/A 06/19/2017   Procedure: COLONOSCOPY WITH PROPOFOL;  Surgeon: [name], MD;  [facility]: [facility] ENDOSCOPY;  Service: Endoscopy;  Laterality: N/A;  . LAPAROSCOPIC SALPINGO OOPHERECTOMY Right 03/22/2017   Procedure: LAPAROSCOPIC SALPINGO OOPHORECTOMY;  Surgeon: Ward, Honor Loh, MD;  Location: [facility] ORS;  Service: Gynecology;  Laterality: Right;  . TOTAL KNEE ARTHROPLASTY Right 10/29/2018    Procedure: TOTAL KNEE ARTHROPLASTY;  Surgeon: Carole Civil, MD;  Location: [facility] ORS;  Service: Orthopedics;  Laterality: Right;  . TOTAL KNEE ARTHROPLASTY Left 05/27/2019   Procedure: TOTAL KNEE ARTHROPLASTY;  Surgeon: Carole Civil, MD;  Location: [facility] ORS;  Service: Orthopedics;  Laterality: Left;    SOCIAL HISTORY: Social History   Tobacco Use  . Smoking status: Former Smoker    Packs/day: 0.50    Years: 4.00    Pack years: 2.00    Quit date: 10/23/1986    Years since quitting: 32.8  . Smokeless tobacco: Never Used  Substance Use Topics  . Alcohol use: No  . Drug use: No    FAMILY HISTORY: Family History  Problem Relation Age of Onset  . Breast cancer Mother 17  . Diabetes Mother   . Depression Mother   . Anxiety disorder Mother   . Obesity Mother   . Breast cancer Paternal Grandmother     ROS: Review of Systems  Gastrointestinal: Negative for nausea and vomiting.  Musculoskeletal:       Negative for muscle weakness.  Endo/Heme/Allergies:       Positive for polyphagia.    PHYSICAL EXAM: Pt in no acute distress  RECENT LABS AND TESTS: BMET    Component Value Date/Time   NA 141 08/11/2019 0804   NA 142 04/21/2019 1143   NA 137 05/23/2013 1515   K 4.0 08/11/2019 0804   K 3.9 05/23/2013 1515   CL 105 08/11/2019 0804   CL 104 05/23/2013 1515   CO2 28 08/11/2019 0804   CO2 35 (H) 05/23/2013 1515   GLUCOSE 92 08/11/2019 0804   GLUCOSE 68 05/23/2013 1515   BUN 15 08/11/2019 0804   BUN 21 04/21/2019 1143   BUN 10 05/23/2013 1515   CREATININE 0.72 08/11/2019 0804   CREATININE 0.86 05/23/2013 1515   CALCIUM 9.6 08/11/2019 0804   CALCIUM 8.4 (L) 05/23/2013 1515   GFRNONAA >60 05/28/2019 0603   GFRNONAA >60 05/23/2013 1515   GFRAA >60 05/28/2019 0603   GFRAA >60 05/23/2013 1515   Lab Results  Component Value Date   HGBA1C 5.6 04/21/2019   HGBA1C 5.6 12/17/2018   HGBA1C 5.7 (H) 08/19/2018   HGBA1C 5.9 06/03/2018   HGBA1C 5.8 04/27/2017   Lab  Results  Component Value Date   INSULIN 22.8 04/21/2019   INSULIN 25.6 (H) 12/17/2018   INSULIN 29.0 (H) 08/19/2018   CBC    Component Value Date/Time   WBC  7.3 08/11/2019 0804   RBC 4.64 08/11/2019 0804   HGB 12.2 08/11/2019 0804   HGB 13.0 04/21/2019 1143   HCT 37.6 08/11/2019 0804   HCT 40.1 04/21/2019 1143   PLT 228.0 08/11/2019 0804   PLT 225 11/17/2016 1000   MCV 81.0 08/11/2019 0804   MCV 81 04/21/2019 1143   MCV 83 05/14/2013 1149   MCH 26.6 05/29/2019 0525   MCHC 32.4 08/11/2019 0804   RDW 14.5 08/11/2019 0804   RDW 15.6 (H) 04/21/2019 1143   RDW 14.1 05/14/2013 1149   LYMPHSABS 2.9 05/23/2019 1340   LYMPHSABS 2.4 04/21/2019 1143   MONOABS 0.5 05/23/2019 1340   EOSABS 0.2 05/23/2019 1340   EOSABS 0.2 04/21/2019 1143   BASOSABS 0.0 05/23/2019 1340   BASOSABS 0.1 04/21/2019 1143   Iron/TIBC/Ferritin/ %Sat    Component Value Date/Time   IRON 50 06/24/2014 1542   TIBC 328 06/24/2014 1542   FERRITIN 130 06/24/2014 1542   IRONPCTSAT 15 06/24/2014 1542   Lipid Panel     Component Value Date/Time   CHOL 142 08/11/2019 0804   CHOL 171 12/17/2018 1034   TRIG 108.0 08/11/2019 0804   HDL 39.20 08/11/2019 0804   HDL 55 12/17/2018 1034   CHOLHDL 4 08/11/2019 0804   VLDL 21.6 08/11/2019 0804   LDLCALC 81 08/11/2019 0804   LDLCALC 98 12/17/2018 1034   Hepatic Function Panel     Component Value Date/Time   PROT 7.4 04/21/2019 1143   ALBUMIN 4.5 04/21/2019 1143   AST 29 04/21/2019 1143   ALT 36 (H) 04/21/2019 1143   ALKPHOS 104 04/21/2019 1143   BILITOT 0.5 04/21/2019 1143      Component Value Date/Time   TSH 2.850 08/19/2018 1005   TSH 1.70 03/24/2016 1259   Results for TYNISHA, MESECHER (MRN [id number]) as of 08/20/2019 07:53  Ref. Range 08/11/2019 08:04  VITD Latest Ref Range: 30.00 - 100.00 ng/mL 61.60   I, Marcille Blanco, CMA, am acting as Location manager for [employer], FNP-C  I have reviewed the above documentation for accuracy and  completeness, and I agree with the above.  - Kajuana Shareef, FNP-C.

## 2019-08-21 DIAGNOSIS — Z96652 Presence of left artificial knee joint: Secondary | ICD-10-CM | POA: Diagnosis not present

## 2019-08-21 DIAGNOSIS — L817 Pigmented purpuric dermatosis: Secondary | ICD-10-CM | POA: Diagnosis not present

## 2019-08-21 DIAGNOSIS — I8393 Asymptomatic varicose veins of bilateral lower extremities: Secondary | ICD-10-CM | POA: Diagnosis not present

## 2019-08-21 DIAGNOSIS — M25561 Pain in right knee: Secondary | ICD-10-CM | POA: Diagnosis not present

## 2019-08-21 DIAGNOSIS — R2689 Other abnormalities of gait and mobility: Secondary | ICD-10-CM | POA: Diagnosis not present

## 2019-08-22 ENCOUNTER — Ambulatory Visit (INDEPENDENT_AMBULATORY_CARE_PROVIDER_SITE_OTHER): Payer: 59 | Admitting: Orthopedic Surgery

## 2019-08-22 ENCOUNTER — Encounter: Payer: Self-pay | Admitting: Orthopedic Surgery

## 2019-08-22 ENCOUNTER — Other Ambulatory Visit: Payer: Self-pay

## 2019-08-22 VITALS — BP 137/90 | HR 68 | Temp 97.2°F | Ht 62.0 in | Wt 230.0 lb

## 2019-08-22 DIAGNOSIS — Z96652 Presence of left artificial knee joint: Secondary | ICD-10-CM

## 2019-08-22 DIAGNOSIS — M19049 Primary osteoarthritis, unspecified hand: Secondary | ICD-10-CM

## 2019-08-22 MED ORDER — DICLOFENAC POTASSIUM 50 MG PO TABS: 50.0000 mg | ORAL_TABLET | Freq: Two times a day (BID) | ORAL | 3 refills | Status: DC

## 2019-08-22 MED ORDER — METHOCARBAMOL 500 MG PO TABS: 500.0000 mg | ORAL_TABLET | Freq: Four times a day (QID) | ORAL | 1 refills | Status: DC | PRN

## 2019-08-22 NOTE — Progress Notes (Signed)
This is a postop appointment status post left total knee on August 4  She continues to improve she is up to almost 110 degrees on the knee flexion she does have some cramping especially at night left quadriceps  She is walking unassisted  She complains of nodules in knots forming in the DIP joints of the hand especially the right index finger worse after she stopped taking her diclofenac  The digits do show Heberden's Bouchard's type nodes with no evidence of rheumatoid disease but tenderness over the DIP joint especially over the nodule  The knee looks fine her extension is good her flexion is good  Recommend resume diclofenac resume and continue Robaxin at night follow-up in 3 months

## 2019-08-22 NOTE — Patient Instructions (Addendum)
Take the robaxin as least 3 weeks in a row   WORK: MAY RESUME REGULAR DUTY AND HRS

## 2019-08-26 DIAGNOSIS — R2689 Other abnormalities of gait and mobility: Secondary | ICD-10-CM | POA: Diagnosis not present

## 2019-08-26 DIAGNOSIS — Z96652 Presence of left artificial knee joint: Secondary | ICD-10-CM | POA: Diagnosis not present

## 2019-08-26 DIAGNOSIS — M25561 Pain in right knee: Secondary | ICD-10-CM | POA: Diagnosis not present

## 2019-08-28 DIAGNOSIS — Z96652 Presence of left artificial knee joint: Secondary | ICD-10-CM | POA: Diagnosis not present

## 2019-08-28 DIAGNOSIS — R2689 Other abnormalities of gait and mobility: Secondary | ICD-10-CM | POA: Diagnosis not present

## 2019-08-28 DIAGNOSIS — M25561 Pain in right knee: Secondary | ICD-10-CM | POA: Diagnosis not present

## 2019-09-02 DIAGNOSIS — M25561 Pain in right knee: Secondary | ICD-10-CM | POA: Diagnosis not present

## 2019-09-02 DIAGNOSIS — R2689 Other abnormalities of gait and mobility: Secondary | ICD-10-CM | POA: Diagnosis not present

## 2019-09-02 DIAGNOSIS — Z96652 Presence of left artificial knee joint: Secondary | ICD-10-CM | POA: Diagnosis not present

## 2019-09-05 DIAGNOSIS — Z96652 Presence of left artificial knee joint: Secondary | ICD-10-CM | POA: Diagnosis not present

## 2019-09-05 DIAGNOSIS — R2689 Other abnormalities of gait and mobility: Secondary | ICD-10-CM | POA: Diagnosis not present

## 2019-09-05 DIAGNOSIS — M25561 Pain in right knee: Secondary | ICD-10-CM | POA: Diagnosis not present

## 2019-09-10 ENCOUNTER — Other Ambulatory Visit: Payer: Self-pay

## 2019-09-10 ENCOUNTER — Ambulatory Visit (INDEPENDENT_AMBULATORY_CARE_PROVIDER_SITE_OTHER): Payer: 59 | Admitting: Family Medicine

## 2019-09-10 ENCOUNTER — Encounter (INDEPENDENT_AMBULATORY_CARE_PROVIDER_SITE_OTHER): Payer: Self-pay | Admitting: Family Medicine

## 2019-09-10 VITALS — BP 145/76 | HR 70 | Temp 97.7°F | Ht 63.0 in | Wt 227.0 lb

## 2019-09-10 DIAGNOSIS — I10 Essential (primary) hypertension: Secondary | ICD-10-CM

## 2019-09-10 DIAGNOSIS — E88819 Insulin resistance, unspecified: Secondary | ICD-10-CM

## 2019-09-10 DIAGNOSIS — Z9189 Other specified personal risk factors, not elsewhere classified: Secondary | ICD-10-CM

## 2019-09-10 DIAGNOSIS — E8881 Metabolic syndrome: Secondary | ICD-10-CM

## 2019-09-10 DIAGNOSIS — Z6841 Body Mass Index (BMI) 40.0 and over, adult: Secondary | ICD-10-CM | POA: Diagnosis not present

## 2019-09-10 MED ORDER — HYDROCHLOROTHIAZIDE 25 MG PO TABS: 25.0000 mg | ORAL_TABLET | Freq: Every day | ORAL | 0 refills | Status: DC

## 2019-09-10 MED ORDER — SAXENDA 18 MG/3ML ~~LOC~~ SOPN: 3.0000 mg | PEN_INJECTOR | Freq: Every day | SUBCUTANEOUS | 0 refills | Status: DC

## 2019-09-11 ENCOUNTER — Encounter (INDEPENDENT_AMBULATORY_CARE_PROVIDER_SITE_OTHER): Payer: Self-pay | Admitting: Family Medicine

## 2019-09-11 NOTE — Progress Notes (Signed)
Office: [phone]  /  Fax: [phone]   HPI:   Chief Complaint: OBESITY Amy Mooney is here to discuss her progress with her obesity treatment plan. She is on the Category 2 plan and is following her eating plan approximately 80 % of the time. She states she is exercising 0 minutes 0 times per week. Amy Mooney increased Saxenda to 3 mg subQ daily and her hunger is well controlled. She is doing well on the Category 2 plan and she is getting all of the protein in. She is finished with physical therapy (status post TKR). Amy Mooney does not feel that Thanksgiving will get her off track. Her weight is 227 lb (103 kg) today and has had a weight loss of 1 pound since her last in-office visit. She has lost 17 lbs since starting treatment with [name].  Hypertension Amy Mooney is a 53 y.o. female with hypertension. Her blood pressure is high today. She has not taken her medications all this week. Amy Mooney denies chest pain or shortness of breath on exertion. She is working weight loss to help control her blood pressure with the goal of decreasing her risk of heart attack and stroke. [place] blood pressure is currently controlled. BP Readings from Last 3 Encounters:  09/10/19 (!) 145/76  08/22/19 137/90  08/18/19 126/84    Insulin Resistance Amy Mooney has a diagnosis of insulin resistance based on her elevated fasting insulin level >5.  Amy Mooney is not taking metformin currently and she continues to work on diet and exercise to decrease risk of diabetes. Amy Mooney denies polyphagia. Lab Results  Component Value Date   HGBA1C 5.6 04/21/2019    At risk for cardiovascular disease Amy Mooney is at a higher than average risk for cardiovascular disease due to obesity, hypertension and insulin resistance. She currently denies any chest pain.  ASSESSMENT AND PLAN:  Insulin resistance  Essential hypertension - Plan: hydrochlorothiazide (HYDRODIURIL) 25 MG tablet  At risk for heart disease  Class 3 severe  obesity with serious comorbidity and body mass index (BMI) of 40.0 to 44.9 in adult, unspecified obesity type ([place]) - Plan: Liraglutide -Weight Management (SAXENDA) 18 MG/3ML SOPN  PLAN:  Hypertension We discussed sodium restriction, working on healthy weight loss, and a regular exercise program as the means to achieve improved blood pressure control. Amy Mooney agreed with this plan and agreed to follow up as directed. We will continue to monitor her blood pressure as well as her progress with the above lifestyle modifications. Kallen agrees to continue HCTZ 25 mg daily #30 with no refills. Compliance with HCTZ was encouraged.   Insulin Resistance Amy Mooney will continue to work on weight loss, exercise, and decreasing simple carbohydrates in her diet to help decrease the risk of diabetes. She was informed that eating too many simple carbohydrates or too many calories at one sitting increases the likelihood of GI side effects. Adore agreed to continue Saxenda 3.0 mg daily #5 pens with no refills and follow up with [name] as directed to monitor her progress.  Cardiovascular risk counseling Amy Mooney was given extended (15 minutes) coronary artery disease prevention counseling today. She is 53 y.o. female and has risk factors for heart disease including obesity, hypertension and insulin resistance We discussed intensive lifestyle modifications today with an emphasis on specific weight loss instructions and strategies. Pt was also informed of the importance of increasing exercise and decreasing saturated fats to help prevent heart disease.  Obesity Amy Mooney is currently in the action stage of change. As such, her  goal is to continue with weight loss efforts She has agreed to follow the Category 2 plan Amy Mooney will increase walking for weight loss and overall health benefits. We discussed the following Behavioral Modification Strategies today: planning for success and holiday eating strategies   Amy Mooney agrees to  continue Saxenda 3.0 mg subQ daily #5 pens with no refills.  Amy Mooney has agreed to follow up with our clinic in 4 weeks. She was informed of the importance of frequent follow up visits to maximize her success with intensive lifestyle modifications for her multiple health conditions.  ALLERGIES: Allergies  Allergen Reactions   Nuvigil [Armodafinil] Hives   Penicillins Rash and Other (See Comments)    Has patient had a PCN reaction causing immediate rash, facial/tongue/throat swelling, SOB or lightheadedness with hypotension: Unknown Has patient had a PCN reaction causing severe rash involving mucus membranes or skin necrosis: Unknown Has patient had a PCN reaction that required hospitalization: Unknown Has patient had a PCN reaction occurring within the last 10 years: Unknown If all of the above answers are "NO", then may proceed with Cephalosporin use.    Vancomycin Hives   Lyrica [Pregabalin] Hives   Bupropion Other (See Comments)    Causes migraines    MEDICATIONS: Current Outpatient Medications on File Prior to Visit  Medication Sig Dispense Refill   Calcium Carb-Cholecalciferol (CALCIUM + D3 PO) Take 1 tablet by mouth at bedtime.     Cholecalciferol (VITAMIN D3) 5000 units CAPS Take 5,000 Units by mouth at bedtime.      clindamycin (CLEOCIN) 150 MG capsule 300 MG HALF HOUR BEFORE CLEANING 40 capsule 0   diclofenac (CATAFLAM) 50 MG tablet Take 1 tablet (50 mg total) by mouth 2 (two) times daily. 90 tablet 3   doxycycline (VIBRA-TABS) 100 MG tablet Take 100 mg by mouth daily as needed (acne).      fluconazole (DIFLUCAN) 100 MG tablet Take 100 mg by mouth daily as needed (FINGER).      Insulin Pen Needle (BD PEN NEEDLE NANO 2ND GEN) 32G X 4 MM MISC Use one needle daily for Saxenda injection. 100 each 0   loratadine (CLARITIN) 10 MG tablet Take 10 mg by mouth daily as needed for allergies.     methocarbamol (ROBAXIN) 500 MG tablet Take 1 tablet (500 mg total) by mouth  every 6 (six) hours as needed for muscle spasms. 56 tablet 1   Multiple Vitamins-Minerals (HAIR SKIN AND NAILS FORMULA PO) Take 1 tablet by mouth daily.     pramipexole (MIRAPEX) 0.25 MG tablet TAKE 1 TABLET BY MOUTH 30 MINUTES TO 1 HOUR PRIOR TO BEDTIME 90 tablet 3   rizatriptan (MAXALT) 10 MG tablet TAKE 1 TABLET BY MOUTH AT MIGRAINE ONSET MAY REPEAT IN 2 HOURS IF HEADACHE PERSISTS.NOT EXCEED 2 TABLETS IN 24HRS 10 tablet 0   valACYclovir (VALTREX) 1000 MG tablet Take 1,000 mg by mouth 2 (two) times daily as needed (fever blisters/cold sores.).      Multiple Vitamins-Minerals (MULTIVITAMIN WITH MINERALS) tablet Take 1 tablet by mouth daily.     No current facility-administered medications on file prior to visit.     PAST MEDICAL HISTORY: Past Medical History:  Diagnosis Date   Chronic knee pain    arthritis   Fever blister    history of   History of kidney stones    Hypertension    Migraines    Morbid obesity with BMI of 40.0-44.9, adult ([place])    Narcolepsy without cataplexy(347.00)    Oral  herpes    Osteoarthritis    Restless leg    Sleep apnea    per pt not treated; not able to use CPAP due to claustrophobia   Vitamin D deficiency     PAST SURGICAL HISTORY: Past Surgical History:  Procedure Laterality Date   ABDOMINAL HYSTERECTOMY  2010   artroscopic meniscus repair Bilateral    cervix removed, bladder tack     2015   COLONOSCOPY WITH PROPOFOL N/A 06/19/2017   Procedure: COLONOSCOPY WITH PROPOFOL;  Surgeon: [name], MD;  Location: [facility];  Service: Endoscopy;  Laterality: N/A;   LAPAROSCOPIC SALPINGO OOPHERECTOMY Right 03/22/2017   Procedure: LAPAROSCOPIC SALPINGO OOPHORECTOMY;  Surgeon: Ward, Honor Loh, MD;  Location: [facility];  Service: Gynecology;  Laterality: Right;   TOTAL KNEE ARTHROPLASTY Right 10/29/2018   Procedure: TOTAL KNEE ARTHROPLASTY;  Surgeon: Carole Civil, MD;  Location: [facility];  Service: Orthopedics;  Laterality:  Right;   TOTAL KNEE ARTHROPLASTY Left 05/27/2019   Procedure: TOTAL KNEE ARTHROPLASTY;  Surgeon: Carole Civil, MD;  Location: [facility];  Service: Orthopedics;  Laterality: Left;    SOCIAL HISTORY: Social History   Tobacco Use   Smoking status: Former Smoker    Packs/day: 0.50    Years: 4.00    Pack years: 2.00    Quit date: 10/23/1986    Years since quitting: 32.9   Smokeless tobacco: Never Used  Substance Use Topics   Alcohol use: No   Drug use: No    FAMILY HISTORY: Family History  Problem Relation Age of Onset   Breast cancer Mother 49   Diabetes Mother    Depression Mother    Anxiety disorder Mother    Obesity Mother    Breast cancer Paternal Grandmother     ROS: Review of Systems  Constitutional: Positive for weight loss.  Respiratory: Negative for shortness of breath (on exertion).   Cardiovascular: Negative for chest pain.  Endo/Heme/Allergies:       Negative for polyphagia    PHYSICAL EXAM: Blood pressure (!) 145/76, pulse 70, temperature 97.7 F (36.5 C), temperature source Oral, height 5\' 3"  (1.6 m), weight 227 lb (103 kg), SpO2 98 %. Body mass index is 40.21 kg/m. Physical Exam Vitals signs reviewed.  Constitutional:      Appearance: Normal appearance. She is well-developed. She is obese.  Cardiovascular:     Rate and Rhythm: Normal rate.  Pulmonary:     Effort: Pulmonary effort is normal.  Musculoskeletal: Normal range of motion.  Skin:    General: Skin is warm and dry.  Neurological:     Mental Status: She is alert and oriented to person, place, and time.  Psychiatric:        Mood and Affect: Mood normal.        Behavior: Behavior normal.     RECENT LABS AND TESTS: BMET    Component Value Date/Time   NA 141 08/11/2019 0804   NA 142 04/21/2019 1143   NA 137 05/23/2013 1515   K 4.0 08/11/2019 0804   K 3.9 05/23/2013 1515   CL 105 08/11/2019 0804   CL 104 05/23/2013 1515   CO2 28 08/11/2019 0804   CO2 35 (H) 05/23/2013  1515   GLUCOSE 92 08/11/2019 0804   GLUCOSE 68 05/23/2013 1515   BUN 15 08/11/2019 0804   BUN 21 04/21/2019 1143   BUN 10 05/23/2013 1515   CREATININE 0.72 08/11/2019 0804   CREATININE 0.86 05/23/2013 1515   CALCIUM 9.6 08/11/2019  0804   CALCIUM 8.4 (L) 05/23/2013 1515   GFRNONAA >60 05/28/2019 0603   GFRNONAA >60 05/23/2013 1515   GFRAA >60 05/28/2019 0603   GFRAA >60 05/23/2013 1515   Lab Results  Component Value Date   HGBA1C 5.6 04/21/2019   HGBA1C 5.6 12/17/2018   HGBA1C 5.7 (H) 08/19/2018   HGBA1C 5.9 06/03/2018   HGBA1C 5.8 04/27/2017   Lab Results  Component Value Date   INSULIN 22.8 04/21/2019   INSULIN 25.6 (H) 12/17/2018   INSULIN 29.0 (H) 08/19/2018   CBC    Component Value Date/Time   WBC 7.3 08/11/2019 0804   RBC 4.64 08/11/2019 0804   HGB 12.2 08/11/2019 0804   HGB 13.0 04/21/2019 1143   HCT 37.6 08/11/2019 0804   HCT 40.1 04/21/2019 1143   PLT 228.0 08/11/2019 0804   PLT 225 11/17/2016 1000   MCV 81.0 08/11/2019 0804   MCV 81 04/21/2019 1143   MCV 83 05/14/2013 1149   MCH 26.6 05/29/2019 0525   MCHC 32.4 08/11/2019 0804   RDW 14.5 08/11/2019 0804   RDW 15.6 (H) 04/21/2019 1143   RDW 14.1 05/14/2013 1149   LYMPHSABS 2.9 05/23/2019 1340   LYMPHSABS 2.4 04/21/2019 1143   MONOABS 0.5 05/23/2019 1340   EOSABS 0.2 05/23/2019 1340   EOSABS 0.2 04/21/2019 1143   BASOSABS 0.0 05/23/2019 1340   BASOSABS 0.1 04/21/2019 1143   Iron/TIBC/Ferritin/ %Sat    Component Value Date/Time   IRON 50 06/24/2014 1542   TIBC 328 06/24/2014 1542   FERRITIN 130 06/24/2014 1542   IRONPCTSAT 15 06/24/2014 1542   Lipid Panel     Component Value Date/Time   CHOL 142 08/11/2019 0804   CHOL 171 12/17/2018 1034   TRIG 108.0 08/11/2019 0804   HDL 39.20 08/11/2019 0804   HDL 55 12/17/2018 1034   CHOLHDL 4 08/11/2019 0804   VLDL 21.6 08/11/2019 0804   LDLCALC 81 08/11/2019 0804   LDLCALC 98 12/17/2018 1034   Hepatic Function Panel     Component Value  Date/Time   PROT 7.4 04/21/2019 1143   ALBUMIN 4.5 04/21/2019 1143   AST 29 04/21/2019 1143   ALT 36 (H) 04/21/2019 1143   ALKPHOS 104 04/21/2019 1143   BILITOT 0.5 04/21/2019 1143      Component Value Date/Time   TSH 2.850 08/19/2018 1005   TSH 1.70 03/24/2016 1259     Ref. Range 08/11/2019 08:04  VITD Latest Ref Range: 30.00 - 100.00 ng/mL 61.60    OBESITY BEHAVIORAL INTERVENTION VISIT  Today's visit was # 19   Starting weight: 244 lbs Starting date: 08/19/2018 Today's weight : 227 lbs Today's date: 09/10/2019 Total lbs lost to date: 17    09/10/2019  Height 5\' 3"  (1.6 m)  Weight 227 lb (103 kg)  BMI (Calculated) 40.22  BLOOD PRESSURE - SYSTOLIC [id number]  BLOOD PRESSURE - DIASTOLIC 76   Body Fat % [id number] %    ASK: We discussed the diagnosis of obesity with Justus B Dorough today and Anastyn agreed to give [name] permission to discuss obesity behavioral modification therapy today.  ASSESS: Rella has the diagnosis of obesity and her BMI today is 40.22 Jaelynne is in the action stage of change   ADVISE: Jesse was educated on the multiple health risks of obesity as well as the benefit of weight loss to improve her health. She was advised of the need for long term treatment and the importance of lifestyle modifications to improve her current health and to  decrease her risk of future health problems.  AGREE: Multiple dietary modification options and treatment options were discussed and  Kosha agreed to follow the recommendations documented in the above note.  ARRANGE: Nakiesha was educated on the importance of frequent visits to treat obesity as outlined per CMS and USPSTF guidelines and agreed to schedule her next follow up appointment today.  I, Doreene Nest, am acting as transcriptionist for [employer], FNP-C  I have reviewed the above documentation for accuracy and completeness, and I agree with the above.  - Bliss Behnke, FNP-C.

## 2019-09-30 NOTE — Progress Notes (Signed)
    MRN : [id number]  Amy Mooney is a 53 y.o. (08/26/1966) female who presents with chief complaint of painful varicose veins.    The patient's right great lower extremity was sterilely prepped and draped.  The ultrasound machine was used to visualize the great saphenous saphenous vein throughout its course.  A segment at the knee was selected for access.  The saphenous vein was accessed without difficulty using ultrasound guidance with a micropuncture needle.   An 0.018  wire was placed beyond the saphenofemoral junction through the sheath and the microneedle was removed.  The 65 cm sheath was then placed over the wire and the wire and dilator were removed.  The laser fiber was placed through the sheath and its tip was placed approximately 2 cm below the saphenofemoral junction.  Tumescent anesthesia was then created with a dilute lidocaine solution.  Laser energy was then delivered with constant withdrawal of the sheath and laser fiber.  Approximately 1072 Joules of energy were delivered over a length of 23 cm.  Sterile dressings were placed.  The patient tolerated the procedure well without complications.

## 2019-10-02 ENCOUNTER — Other Ambulatory Visit: Payer: Self-pay

## 2019-10-02 ENCOUNTER — Encounter (INDEPENDENT_AMBULATORY_CARE_PROVIDER_SITE_OTHER): Payer: Self-pay | Admitting: Vascular Surgery

## 2019-10-02 ENCOUNTER — Ambulatory Visit (INDEPENDENT_AMBULATORY_CARE_PROVIDER_SITE_OTHER): Payer: 59 | Admitting: Vascular Surgery

## 2019-10-02 VITALS — BP 154/84 | HR 71 | Resp 16 | Wt 234.0 lb

## 2019-10-02 DIAGNOSIS — I83811 Varicose veins of right lower extremities with pain: Secondary | ICD-10-CM

## 2019-10-02 DIAGNOSIS — I83813 Varicose veins of bilateral lower extremities with pain: Secondary | ICD-10-CM

## 2019-10-06 ENCOUNTER — Other Ambulatory Visit (INDEPENDENT_AMBULATORY_CARE_PROVIDER_SITE_OTHER): Payer: 59

## 2019-10-06 ENCOUNTER — Other Ambulatory Visit (INDEPENDENT_AMBULATORY_CARE_PROVIDER_SITE_OTHER): Payer: Self-pay | Admitting: Vascular Surgery

## 2019-10-06 ENCOUNTER — Other Ambulatory Visit: Payer: Self-pay

## 2019-10-06 DIAGNOSIS — Z9889 Other specified postprocedural states: Secondary | ICD-10-CM | POA: Diagnosis not present

## 2019-10-08 ENCOUNTER — Ambulatory Visit (INDEPENDENT_AMBULATORY_CARE_PROVIDER_SITE_OTHER): Payer: 59 | Admitting: Family Medicine

## 2019-10-13 ENCOUNTER — Other Ambulatory Visit: Payer: Self-pay

## 2019-10-13 ENCOUNTER — Encounter (INDEPENDENT_AMBULATORY_CARE_PROVIDER_SITE_OTHER): Payer: Self-pay | Admitting: Vascular Surgery

## 2019-10-13 ENCOUNTER — Ambulatory Visit (INDEPENDENT_AMBULATORY_CARE_PROVIDER_SITE_OTHER): Payer: 59 | Admitting: Vascular Surgery

## 2019-10-13 ENCOUNTER — Other Ambulatory Visit (INDEPENDENT_AMBULATORY_CARE_PROVIDER_SITE_OTHER): Payer: Self-pay | Admitting: Vascular Surgery

## 2019-10-13 VITALS — BP 137/82 | HR 77 | Resp 17 | Ht 63.0 in | Wt 236.0 lb

## 2019-10-13 DIAGNOSIS — I83812 Varicose veins of left lower extremities with pain: Secondary | ICD-10-CM | POA: Diagnosis not present

## 2019-10-13 DIAGNOSIS — I83813 Varicose veins of bilateral lower extremities with pain: Secondary | ICD-10-CM

## 2019-10-13 NOTE — Progress Notes (Signed)
    MRN : [id number]  Amy Mooney is a 53 y.o. (1966-03-30) female who presents with chief complaint of painful varicose veins.    The patient's left lower extremity was sterilely prepped and draped.  The ultrasound machine was used to visualize the left great saphenous vein throughout its course.  A segment at the knee was selected for access.  The saphenous vein was accessed without difficulty using ultrasound guidance with a micropuncture needle.   An 0.018  wire was placed beyond the saphenofemoral junction through the sheath and the microneedle was removed.  The 65 cm sheath was then placed over the wire and the wire and dilator were removed.  The laser fiber was placed through the sheath and its tip was placed approximately 2 cm below the saphenofemoral junction.  Tumescent anesthesia was then created with a dilute lidocaine solution.  Laser energy was then delivered with constant withdrawal of the sheath and laser fiber.  Approximately 1188 Joules of energy were delivered over a length of 34 cm.  Sterile dressings were placed.  The patient tolerated the procedure well without complications.

## 2019-10-14 ENCOUNTER — Encounter (INDEPENDENT_AMBULATORY_CARE_PROVIDER_SITE_OTHER): Payer: Self-pay

## 2019-10-15 ENCOUNTER — Other Ambulatory Visit: Payer: Self-pay

## 2019-10-15 ENCOUNTER — Encounter (INDEPENDENT_AMBULATORY_CARE_PROVIDER_SITE_OTHER): Payer: Self-pay | Admitting: Bariatrics

## 2019-10-15 ENCOUNTER — Ambulatory Visit (INDEPENDENT_AMBULATORY_CARE_PROVIDER_SITE_OTHER): Payer: 59

## 2019-10-15 ENCOUNTER — Ambulatory Visit (INDEPENDENT_AMBULATORY_CARE_PROVIDER_SITE_OTHER): Payer: 59 | Admitting: Bariatrics

## 2019-10-15 VITALS — BP 156/79 | HR 66 | Temp 98.0°F | Ht 63.0 in | Wt 230.0 lb

## 2019-10-15 DIAGNOSIS — Z6841 Body Mass Index (BMI) 40.0 and over, adult: Secondary | ICD-10-CM | POA: Diagnosis not present

## 2019-10-15 DIAGNOSIS — R7303 Prediabetes: Secondary | ICD-10-CM | POA: Diagnosis not present

## 2019-10-15 DIAGNOSIS — I10 Essential (primary) hypertension: Secondary | ICD-10-CM | POA: Diagnosis not present

## 2019-10-15 DIAGNOSIS — I83812 Varicose veins of left lower extremities with pain: Secondary | ICD-10-CM | POA: Diagnosis not present

## 2019-10-15 MED ORDER — HYDROCHLOROTHIAZIDE 25 MG PO TABS: 25.0000 mg | ORAL_TABLET | Freq: Every day | ORAL | 0 refills | Status: DC

## 2019-10-15 MED ORDER — SAXENDA 18 MG/3ML ~~LOC~~ SOPN: 3.0000 mg | PEN_INJECTOR | Freq: Every day | SUBCUTANEOUS | 0 refills | Status: DC

## 2019-10-21 NOTE — Progress Notes (Signed)
Office: [phone]  /  Fax: [phone]   HPI:  Chief Complaint: OBESITY Amy Mooney is here to discuss her progress with her obesity treatment plan. She is on the Category 2 plan and states she is following her eating plan approximately 75 % of the time. She states she is exercising 0 minutes 0 times per week.  She is up 3 lbs, but she has done reasonably well. She is taking Saxenda 30 mg. She is drinking more water.  Today's visit was # 20  Starting weight: 244 lbs Starting date: 08/19/18 Today's weight : 230 lbs Today's date: 10/15/2019 Total lbs lost to date: 14 Total lbs lost since last in-office visit: 0  Pre-Diabetes Amy Mooney has a diagnosis of pre-diabetes. She denies polyphagia, and notes normal appetite. Last A1c was 5.6 and insulin of 29.1.  Hypertension Amy Mooney has a diagnosis of hypertension. Her blood pressure is elevated today at 156/79 (did not take her medication today).  Risk for Diabetes Amy Mooney is at higher than average risk for developing diabetes due to her obesity.    ASSESSMENT AND PLAN:  Essential hypertension - Plan: hydrochlorothiazide (HYDRODIURIL) 25 MG tablet  Prediabetes  Class 3 severe obesity with serious comorbidity and body mass index (BMI) of 40.0 to 44.9 in adult, unspecified obesity type (Amy Mooney) - Plan: Liraglutide -Weight Management (SAXENDA) 18 MG/3ML SOPN  PLAN:  Pre-Diabetes Amy Mooney will continue to work on weight loss, exercise, increasing heathy fats and protein, and decreasing simple carbohydrates to help decrease the risk of diabetes. We will continue to monitor.  Hypertension Amy Mooney is working on healthy weight loss and exercise to improve blood pressure control. We will watch for signs of hypotension as she continues her lifestyle modifications. Amy Mooney agrees to continue taking hydrochlorothiazide 25 mg 1 tablet PO daily #30 and we will refill for 1 month. Amy Mooney agrees to follow up with [name] as directed to monitor her  progress.  Risk for Diabetes Amy Mooney is a 53 y.o. female and has risk factors for diabetes including obesity. We discussed intensive lifestyle modifications today with an emphasis on weight loss as well as increasing exercise and decreasing simple carbohydrates in her diet. (15 minutes)   Obesity Amy Mooney is currently in the action stage of change. As such, her goal is to continue with weight loss efforts. She has agreed to follow the Category 2 plan. Amy Mooney has been instructed to work up to a goal of 150 minutes of combined cardio and strengthening exercise per week for weight loss and overall health benefits. We discussed the following Behavioral Modification Strategies today: increasing lean protein intake, decreasing simple carbohydrates , increasing vegetables, increasing water intake, decreasing eating out, no skipping meals, meal planning and cooking strategies and keeping healthy foods in the home. We discussed various medication options to help Amy Mooney with her weight loss efforts and we both agreed to continue Saxenda 3 mg SubQ daily #5 pens and we will refill for 1 month.   [place] has agreed to follow-up with our clinic in 2 to 3 weeks with [facility], FNP-C. She was informed of the importance of frequent follow-up visits to maximize her success with intensive lifestyle modifications for her multiple health conditions.  ALLERGIES: Allergies  Allergen Reactions  . Nuvigil [Armodafinil] Hives  . Penicillins Rash and Other (See Comments)    Has patient had a PCN reaction causing immediate rash, facial/tongue/throat swelling, SOB or lightheadedness with hypotension: Unknown Has patient had a PCN reaction causing severe rash involving mucus membranes or skin necrosis:  Unknown Has patient had a PCN reaction that required hospitalization: Unknown Has patient had a PCN reaction occurring within the last 10 years: Unknown If all of the above answers are "NO", then may proceed with  Cephalosporin use.   . Vancomycin Hives  . Lyrica [Pregabalin] Hives  . Bupropion Other (See Comments)    Causes migraines    MEDICATIONS: Current Outpatient Medications on File Prior to Visit  Medication Sig Dispense Refill  . Calcium Carb-Cholecalciferol (CALCIUM + D3 PO) Take 1 tablet by mouth at bedtime.    . Cholecalciferol (VITAMIN D3) 5000 units CAPS Take 5,000 Units by mouth at bedtime.     . clindamycin (CLEOCIN) 150 MG capsule 300 MG HALF HOUR BEFORE CLEANING 40 capsule 0  . diclofenac (CATAFLAM) 50 MG tablet Take 1 tablet (50 mg total) by mouth 2 (two) times daily. 90 tablet 3  . doxycycline (VIBRA-TABS) 100 MG tablet Take 100 mg by mouth daily as needed (acne).     . fluconazole (DIFLUCAN) 100 MG tablet Take 100 mg by mouth daily as needed (FINGER).     . Insulin Pen Needle (BD PEN NEEDLE NANO 2ND GEN) 32G X 4 MM MISC Use one needle daily for Saxenda injection. 100 each 0  . loratadine (CLARITIN) 10 MG tablet Take 10 mg by mouth daily as needed for allergies.    . methocarbamol (ROBAXIN) 500 MG tablet Take 1 tablet (500 mg total) by mouth every 6 (six) hours as needed for muscle spasms. 56 tablet 1  . Multiple Vitamins-Minerals (HAIR SKIN AND NAILS FORMULA PO) Take 1 tablet by mouth daily.    . Multiple Vitamins-Minerals (MULTIVITAMIN WITH MINERALS) tablet Take 1 tablet by mouth daily.    . pramipexole (MIRAPEX) 0.25 MG tablet TAKE 1 TABLET BY MOUTH 30 MINUTES TO 1 HOUR PRIOR TO BEDTIME 90 tablet 3  . rizatriptan (MAXALT) 10 MG tablet TAKE 1 TABLET BY MOUTH AT MIGRAINE ONSET MAY REPEAT IN 2 HOURS IF HEADACHE PERSISTS.NOT EXCEED 2 TABLETS IN 24HRS 10 tablet 0  . valACYclovir (VALTREX) 1000 MG tablet Take 1,000 mg by mouth 2 (two) times daily as needed (fever blisters/cold sores.).      No current facility-administered medications on file prior to visit.    PAST MEDICAL HISTORY: Past Medical History:  Diagnosis Date  . Chronic knee pain    arthritis  . Fever blister     history of  . History of kidney stones   . Hypertension   . Migraines   . Morbid obesity with BMI of 40.0-44.9, adult ([place])   . 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 Oral herpes   . Osteoarthritis   . Restless leg   . Sleep apnea    per pt not treated; not able to use CPAP due to claustrophobia  . Vitamin D deficiency     PAST SURGICAL HISTORY: Past Surgical History:  Procedure Laterality Date  . ABDOMINAL HYSTERECTOMY  2010  . artroscopic meniscus repair Bilateral   . cervix removed, bladder tack     2015  . COLONOSCOPY WITH PROPOFOL N/A 06/19/2017   Procedure: COLONOSCOPY WITH PROPOFOL;  Surgeon: [name], MD;  Location: [facility] ENDOSCOPY;  Service: Endoscopy;  Laterality: N/A;  . LAPAROSCOPIC SALPINGO OOPHERECTOMY Right 03/22/2017   Procedure: LAPAROSCOPIC SALPINGO OOPHORECTOMY;  Surgeon: Ward, Honor Loh, MD;  Location: [facility] ORS;  Service: Gynecology;  Laterality: Right;  . TOTAL KNEE ARTHROPLASTY Right 10/29/2018   Procedure: TOTAL KNEE ARTHROPLASTY;  Surgeon: Carole Civil, MD;  Location:  [facility] ORS;  Service: Orthopedics;  Laterality: Right;  . TOTAL KNEE ARTHROPLASTY Left 05/27/2019   Procedure: TOTAL KNEE ARTHROPLASTY;  Surgeon: Carole Civil, MD;  Location: [facility] ORS;  Service: Orthopedics;  Laterality: Left;    SOCIAL HISTORY: Social History   Tobacco Use  . Smoking status: Former Smoker    Packs/day: 0.50    Years: 4.00    Pack years: 2.00    Quit date: 10/23/1986    Years since quitting: 33.0  . Smokeless tobacco: Never Used  Substance Use Topics  . Alcohol use: No  . Drug use: No    FAMILY HISTORY: Family History  Problem Relation Age of Onset  . Breast cancer Mother 31  . Diabetes Mother   . Depression Mother   . Anxiety disorder Mother   . Obesity Mother   . Breast cancer Paternal Grandmother     ROS: Review of Systems  Constitutional: Negative for weight loss.  Endo/Heme/Allergies:       Negative polyphagia    PHYSICAL EXAM: Blood  pressure (!) 156/79, pulse 66, temperature 98 F (36.7 C), temperature source Oral, height 5\' 3"  (1.6 m), weight 230 lb (104.3 kg), SpO2 100 %. Body mass index is 40.74 kg/m. Physical Exam Vitals reviewed.  Constitutional:      Appearance: Normal appearance. She is obese.  Cardiovascular:     Rate and Rhythm: Normal rate.     Pulses: Normal pulses.  Pulmonary:     Effort: Pulmonary effort is normal.     Breath sounds: Normal breath sounds.  Musculoskeletal:        General: Normal range of motion.  Skin:    General: Skin is warm and dry.  Neurological:     Mental Status: She is alert and oriented to person, place, and time.  Psychiatric:        Mood and Affect: Mood normal.        Behavior: Behavior normal.     RECENT LABS AND TESTS: BMET    Component Value Date/Time   NA 141 08/11/2019 0804   NA 142 04/21/2019 1143   NA 137 05/23/2013 1515   K 4.0 08/11/2019 0804   K 3.9 05/23/2013 1515   CL 105 08/11/2019 0804   CL 104 05/23/2013 1515   CO2 28 08/11/2019 0804   CO2 35 (H) 05/23/2013 1515   GLUCOSE 92 08/11/2019 0804   GLUCOSE 68 05/23/2013 1515   BUN 15 08/11/2019 0804   BUN 21 04/21/2019 1143   BUN 10 05/23/2013 1515   CREATININE 0.72 08/11/2019 0804   CREATININE 0.86 05/23/2013 1515   CALCIUM 9.6 08/11/2019 0804   CALCIUM 8.4 (L) 05/23/2013 1515   GFRNONAA >60 05/28/2019 0603   GFRNONAA >60 05/23/2013 1515   GFRAA >60 05/28/2019 0603   GFRAA >60 05/23/2013 1515   Lab Results  Component Value Date   HGBA1C 5.6 04/21/2019   HGBA1C 5.6 12/17/2018   HGBA1C 5.7 (H) 08/19/2018   HGBA1C 5.9 06/03/2018   HGBA1C 5.8 04/27/2017   Lab Results  Component Value Date   INSULIN 22.8 04/21/2019   INSULIN 25.6 (H) 12/17/2018   INSULIN 29.0 (H) 08/19/2018   CBC    Component Value Date/Time   WBC 7.3 08/11/2019 0804   RBC 4.64 08/11/2019 0804   HGB 12.2 08/11/2019 0804   HGB 13.0 04/21/2019 1143   HCT 37.6 08/11/2019 0804   HCT 40.1 04/21/2019 1143   PLT  228.0 08/11/2019 0804   PLT 225 11/17/2016 1000  MCV 81.0 08/11/2019 0804   MCV 81 04/21/2019 1143   MCV 83 05/14/2013 1149   MCH 26.6 05/29/2019 0525   MCHC 32.4 08/11/2019 0804   RDW 14.5 08/11/2019 0804   RDW 15.6 (H) 04/21/2019 1143   RDW 14.1 05/14/2013 1149   LYMPHSABS 2.9 05/23/2019 1340   LYMPHSABS 2.4 04/21/2019 1143   MONOABS 0.5 05/23/2019 1340   EOSABS 0.2 05/23/2019 1340   EOSABS 0.2 04/21/2019 1143   BASOSABS 0.0 05/23/2019 1340   BASOSABS 0.1 04/21/2019 1143   Iron/TIBC/Ferritin/ %Sat    Component Value Date/Time   IRON 50 06/24/2014 1542   TIBC 328 06/24/2014 1542   FERRITIN 130 06/24/2014 1542   IRONPCTSAT 15 06/24/2014 1542   Lipid Panel     Component Value Date/Time   CHOL 142 08/11/2019 0804   CHOL 171 12/17/2018 1034   TRIG 108.0 08/11/2019 0804   HDL 39.20 08/11/2019 0804   HDL 55 12/17/2018 1034   CHOLHDL 4 08/11/2019 0804   VLDL 21.6 08/11/2019 0804   LDLCALC 81 08/11/2019 0804   LDLCALC 98 12/17/2018 1034   Hepatic Function Panel     Component Value Date/Time   PROT 7.4 04/21/2019 1143   ALBUMIN 4.5 04/21/2019 1143   AST 29 04/21/2019 1143   ALT 36 (H) 04/21/2019 1143   ALKPHOS 104 04/21/2019 1143   BILITOT 0.5 04/21/2019 1143      Component Value Date/Time   TSH 2.850 08/19/2018 1005   TSH 1.70 03/24/2016 1259     I, Trixie Dredge, am acting as Location manager for [employer], DO.  I have reviewed the above documentation for accuracy and completeness, and I agree with the above. Brittie Whisnant A. Owens Shark, DO.

## 2019-10-22 ENCOUNTER — Encounter (INDEPENDENT_AMBULATORY_CARE_PROVIDER_SITE_OTHER): Payer: Self-pay | Admitting: Vascular Surgery

## 2019-10-27 ENCOUNTER — Encounter (INDEPENDENT_AMBULATORY_CARE_PROVIDER_SITE_OTHER): Payer: Self-pay | Admitting: Bariatrics

## 2019-10-30 ENCOUNTER — Ambulatory Visit (INDEPENDENT_AMBULATORY_CARE_PROVIDER_SITE_OTHER): Payer: 59 | Admitting: Nurse Practitioner

## 2019-10-30 ENCOUNTER — Other Ambulatory Visit: Payer: Self-pay

## 2019-10-30 ENCOUNTER — Encounter (INDEPENDENT_AMBULATORY_CARE_PROVIDER_SITE_OTHER): Payer: Self-pay | Admitting: Nurse Practitioner

## 2019-10-30 VITALS — BP 138/86 | HR 69 | Resp 16 | Wt 235.6 lb

## 2019-10-30 DIAGNOSIS — I83813 Varicose veins of bilateral lower extremities with pain: Secondary | ICD-10-CM | POA: Diagnosis not present

## 2019-10-30 DIAGNOSIS — I10 Essential (primary) hypertension: Secondary | ICD-10-CM | POA: Diagnosis not present

## 2019-11-03 ENCOUNTER — Encounter (INDEPENDENT_AMBULATORY_CARE_PROVIDER_SITE_OTHER): Payer: Self-pay | Admitting: Nurse Practitioner

## 2019-11-03 NOTE — Progress Notes (Signed)
SUBJECTIVE:  Patient ID: Amy Mooney, female    DOB: 07-26-1966, 54 y.o.   MRN: [id number] Chief Complaint  Patient presents with  . Follow-up    post laser    HPI  Amy Mooney is a 54 y.o. female The patient returns to the office for followup status post laser ablation of the right and left great saphenous vein on 10/02/2019 and 10/13/2019. The patient notes multiple residual varicosities bilaterally which continued to hurt with dependent positions and remained tender to palpation. The patient's swelling is unchanged from preoperative status. The patient continues to wear graduated compression stockings on a daily basis but these are not eliminating the pain and discomfort. The patient continues to use over-the-counter anti-inflammatory medications to treat the pain and related symptoms but this has not given the patient relief. The patient notes the pain in the lower extremities is causing problems with daily exercise, problems at work and even with household activities such as preparing meals and doing dishes.  The patient is otherwise done well and there have been no complications related to the laser procedure or interval changes in the patient's overall   Venous ultrasound post laser shows successful laser ablation of the left and right great saphenous veins, no DVT identified.  Past Medical History:  Diagnosis Date  . Chronic knee pain    arthritis  . Fever blister    history of  . History of kidney stones   . Hypertension   . Migraines   . Morbid obesity with BMI of 40.0-44.9, adult ([place])   . 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 Oral herpes   . Osteoarthritis   . Restless leg   . Sleep apnea    per pt not treated; not able to use CPAP due to claustrophobia  . Vitamin D deficiency     Past Surgical History:  Procedure Laterality Date  . ABDOMINAL HYSTERECTOMY  2010  . artroscopic meniscus repair Bilateral   . cervix removed, bladder tack     2015  .  COLONOSCOPY WITH PROPOFOL N/A 06/19/2017   Procedure: COLONOSCOPY WITH PROPOFOL;  Surgeon: [name], MD;  Location: [facility] ENDOSCOPY;  Service: Endoscopy;  Laterality: N/A;  . LAPAROSCOPIC SALPINGO OOPHERECTOMY Right 03/22/2017   Procedure: LAPAROSCOPIC SALPINGO OOPHORECTOMY;  Surgeon: Ward, Honor Loh, MD;  Location: [facility] ORS;  Service: Gynecology;  Laterality: Right;  . TOTAL KNEE ARTHROPLASTY Right 10/29/2018   Procedure: TOTAL KNEE ARTHROPLASTY;  Surgeon: Carole Civil, MD;  Location: [facility] ORS;  Service: Orthopedics;  Laterality: Right;  . TOTAL KNEE ARTHROPLASTY Left 05/27/2019   Procedure: TOTAL KNEE ARTHROPLASTY;  Surgeon: Carole Civil, MD;  Location: [facility] ORS;  Service: Orthopedics;  Laterality: Left;    Social History   Socioeconomic History  . Marital status: Married    Spouse name: Tierria Martucci  . Number of children: 2  . Years of education: College  . Highest education level: Not on file  Occupational History  . Occupation: CMA    Employer: [employer]  Tobacco Use  . Smoking status: Former Smoker    Packs/day: 0.50    Years: 4.00    Pack years: 2.00    Quit date: 10/23/1986    Years since quitting: 33.0  . Smokeless tobacco: Never Used  Substance and Sexual Activity  . Alcohol use: No  . Drug use: No  . Sexual activity: Yes    Birth control/protection: None  Other Topics Concern  . Not on file  Social  History Narrative   [name])   Patient has two adult children.   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   Patient has an college education.   Patient is right-handed.   Patient drinks very little caffeine.   Enjoys spending time with family.    Social Determinants of Health   Financial Resource Strain:   . Difficulty of Paying Living Expenses: Not on file  Food Insecurity:   . Worried About Charity fundraiser in the Last Year: Not on file  . Ran Out of Food in the Last Year: Not on file    Transportation Needs:   . Lack of Transportation (Medical): Not on file  . Lack of Transportation (Non-Medical): Not on file  Physical Activity:   . Days of Exercise per Week: Not on file  . Minutes of Exercise per Session: Not on file  Stress:   . Feeling of Stress : Not on file  Social Connections:   . Frequency of Communication with Friends and Family: Not on file  . Frequency of Social Gatherings with Friends and Family: Not on file  . Attends Religious Services: Not on file  . Active Member of Clubs or Organizations: Not on file  . Attends Archivist Meetings: Not on file  . Marital Status: Not on file  Intimate Partner Violence:   . Fear of Current or Ex-Partner: Not on file  . Emotionally Abused: Not on file  . Physically Abused: Not on file  . Sexually Abused: Not on file    Family History  Problem Relation Age of Onset  . Breast cancer Mother 24  . Diabetes Mother   . Depression Mother   . Anxiety disorder Mother   . Obesity Mother   . Breast cancer Paternal Grandmother     Allergies  Allergen Reactions  . Nuvigil [Armodafinil] Hives  . Penicillins Rash and Other (See Comments)    Has patient had a PCN reaction causing immediate rash, facial/tongue/throat swelling, SOB or lightheadedness with hypotension: Unknown Has patient had a PCN reaction causing severe rash involving mucus membranes or skin necrosis: Unknown Has patient had a PCN reaction that required hospitalization: Unknown Has patient had a PCN reaction occurring within the last 10 years: Unknown If all of the above answers are "NO", then may proceed with Cephalosporin use.   . Vancomycin Hives  . Lyrica [Pregabalin] Hives  . Bupropion Other (See Comments)    Causes migraines     Review of Systems   Review of Systems: Negative Unless Checked Constitutional: [] Weight loss  [] Fever  [] Chills Cardiac: [] Chest pain   []  Atrial Fibrillation  [] Palpitations   [] Shortness of breath when  laying flat   [] Shortness of breath with exertion. [] Shortness of breath at rest Vascular:  [] Pain in legs with walking   [] Pain in legs with standing [] Pain in legs when laying flat   [] Claudication    [] Pain in feet when laying flat    [] History of DVT   [] Phlebitis   [x] Swelling in legs   [x] Varicose veins   [] Non-healing ulcers Pulmonary:   [] Uses home oxygen   [] Productive cough   [] Hemoptysis   [] Wheeze  [] COPD   [] Asthma Neurologic:  [] Dizziness   [] Seizures  [] Blackouts [] History of stroke   [] History of TIA  [] Aphasia   [] Temporary Blindness   [] Weakness or numbness in arm   [] Weakness or numbness in leg Musculoskeletal:   [] Joint swelling   []   Joint pain   [] Low back pain  []  History of Knee Replacement [x] Arthritis [] back Surgeries  []  Spinal Stenosis    Hematologic:  [] Easy bruising  [] Easy bleeding   [] Hypercoagulable state   [] Anemic Gastrointestinal:  [] Diarrhea   [] Vomiting  [] Gastroesophageal reflux/heartburn   [] Difficulty swallowing. [] Abdominal pain Genitourinary:  [] Chronic kidney disease   [] Difficult urination  [] Anuric   [] Blood in urine [] Frequent urination  [] Burning with urination   [] Hematuria Skin:  [] Rashes   [] Ulcers [] Wounds Psychological:  [] History of anxiety   []  History of major depression  []  Memory Difficulties      OBJECTIVE:   Physical Exam  BP 138/86 (BP Location: Right Arm)   Pulse 69   Resp 16   Wt 235 lb 9.6 oz (106.9 kg)   BMI 41.73 kg/m   Gen: WD/WN, NAD Head: [place]/AT, No temporalis wasting.  Ear/Nose/Throat: Hearing grossly intact, nares w/o erythema or drainage Eyes: PER, EOMI, sclera nonicteric.  Neck: Supple, no masses.  No JVD.  Pulmonary:  Good air movement, no use of accessory muscles.  Cardiac: RRR Vascular:  Scattered varicosities bilaterally Vessel Right Left  Radial Palpable Palpable   Gastrointestinal: soft, non-distended. No guarding/no peritoneal signs.  Musculoskeletal: M/S 5/5 throughout.  No deformity or atrophy.    Neurologic: Pain and light touch intact in extremities.  Symmetrical.  Speech is fluent. Motor exam as listed above. Psychiatric: Judgment intact, Mood & affect appropriate for pt's clinical situation. Dermatologic: No Venous rashes. No Ulcers Noted.  No changes consistent with cellulitis. Lymph : No Cervical lymphadenopathy, no lichenification or skin changes of chronic lymphedema.       ASSESSMENT AND PLAN:  1. Varicose veins of lower extremity with pain, bilateral Recommend:  The patient has had successful ablation of the previously incompetent saphenous venous system but still has persistent symptoms of pain and swelling that are having a negative impact on daily life and daily activities.  Patient should undergo injection sclerotherapy to treat the residual varicosities.  The risks, benefits and alternative therapies were reviewed in detail with the patient.  All questions were answered.  The patient agrees to proceed with sclerotherapy at their convenience.  The patient will continue wearing the graduated compression stockings and using the over-the-counter pain medications to treat her symptoms.       2. Essential hypertension Blood pressure under good control today.  On appropriate medications no changes made.   Current Outpatient Medications on File Prior to Visit  Medication Sig Dispense Refill  . Calcium Carb-Cholecalciferol (CALCIUM + D3 PO) Take 1 tablet by mouth at bedtime.    . Cholecalciferol (VITAMIN D3) 5000 units CAPS Take 5,000 Units by mouth at bedtime.     . clindamycin (CLEOCIN) 150 MG capsule 300 MG HALF HOUR BEFORE CLEANING 40 capsule 0  . diclofenac (CATAFLAM) 50 MG tablet Take 1 tablet (50 mg total) by mouth 2 (two) times daily. 90 tablet 3  . doxycycline (VIBRA-TABS) 100 MG tablet Take 100 mg by mouth daily as needed (acne).     . fluconazole (DIFLUCAN) 100 MG tablet Take 100 mg by mouth daily as needed (FINGER).     . hydrochlorothiazide (HYDRODIURIL)  25 MG tablet Take 1 tablet (25 mg total) by mouth daily. 30 tablet 0  . Insulin Pen Needle (BD PEN NEEDLE NANO 2ND GEN) 32G X 4 MM MISC Use one needle daily for Saxenda injection. 100 each 0  . Liraglutide -Weight Management (SAXENDA) 18 MG/3ML SOPN Inject 3 mg into the  skin daily. 5 pen 0  . loratadine (CLARITIN) 10 MG tablet Take 10 mg by mouth daily as needed for allergies.    . methocarbamol (ROBAXIN) 500 MG tablet Take 1 tablet (500 mg total) by mouth every 6 (six) hours as needed for muscle spasms. 56 tablet 1  . Multiple Vitamins-Minerals (HAIR SKIN AND NAILS FORMULA PO) Take 1 tablet by mouth daily.    . Multiple Vitamins-Minerals (MULTIVITAMIN WITH MINERALS) tablet Take 1 tablet by mouth daily.    . pramipexole (MIRAPEX) 0.25 MG tablet TAKE 1 TABLET BY MOUTH 30 MINUTES TO 1 HOUR PRIOR TO BEDTIME 90 tablet 3  . rizatriptan (MAXALT) 10 MG tablet TAKE 1 TABLET BY MOUTH AT MIGRAINE ONSET MAY REPEAT IN 2 HOURS IF HEADACHE PERSISTS.NOT EXCEED 2 TABLETS IN 24HRS 10 tablet 0  . valACYclovir (VALTREX) 1000 MG tablet Take 1,000 mg by mouth 2 (two) times daily as needed (fever blisters/cold sores.).      No current facility-administered medications on file prior to visit.    There are no Patient Instructions on file for this visit. No follow-ups on file.   Kris Hartmann, NP  This note was completed with Sales executive.  Any errors are purely unintentional.

## 2019-11-06 ENCOUNTER — Ambulatory Visit (INDEPENDENT_AMBULATORY_CARE_PROVIDER_SITE_OTHER): Payer: 59 | Admitting: Family Medicine

## 2019-11-07 ENCOUNTER — Other Ambulatory Visit: Payer: Self-pay | Admitting: Primary Care

## 2019-11-07 DIAGNOSIS — G43001 Migraine without aura, not intractable, with status migrainosus: Secondary | ICD-10-CM

## 2019-11-07 NOTE — Telephone Encounter (Signed)
Refill sent to pharmacy.   

## 2019-11-07 NOTE — Telephone Encounter (Signed)
Please advise if refill is appropriate  Last filled 12.18.19 #10, 0

## 2019-11-18 ENCOUNTER — Other Ambulatory Visit: Payer: Self-pay

## 2019-11-18 ENCOUNTER — Encounter: Payer: Self-pay | Admitting: Primary Care

## 2019-11-18 ENCOUNTER — Ambulatory Visit: Payer: 59 | Admitting: Primary Care

## 2019-11-18 DIAGNOSIS — R55 Syncope and collapse: Secondary | ICD-10-CM | POA: Diagnosis not present

## 2019-11-18 HISTORY — DX: Syncope and collapse: R55
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
## 2019-11-18 NOTE — Patient Instructions (Signed)
I'll be in touch once I complete your paperwork.  Ensure you are consuming 64 ounces of water daily.  It was a pleasure to see you today!

## 2019-11-20 ENCOUNTER — Ambulatory Visit (INDEPENDENT_AMBULATORY_CARE_PROVIDER_SITE_OTHER): Payer: 59 | Admitting: Family Medicine

## 2019-11-28 ENCOUNTER — Ambulatory Visit: Payer: 59 | Admitting: Orthopedic Surgery

## 2019-12-08 ENCOUNTER — Ambulatory Visit (INDEPENDENT_AMBULATORY_CARE_PROVIDER_SITE_OTHER): Payer: 59 | Admitting: Family Medicine

## 2019-12-10 ENCOUNTER — Ambulatory Visit (INDEPENDENT_AMBULATORY_CARE_PROVIDER_SITE_OTHER): Payer: 59 | Admitting: Orthopedic Surgery

## 2019-12-10 ENCOUNTER — Other Ambulatory Visit: Payer: Self-pay

## 2019-12-10 ENCOUNTER — Encounter: Payer: Self-pay | Admitting: Orthopedic Surgery

## 2019-12-10 VITALS — Temp 97.9°F | Ht 63.0 in | Wt 245.0 lb

## 2019-12-10 DIAGNOSIS — Z96651 Presence of right artificial knee joint: Secondary | ICD-10-CM

## 2019-12-10 DIAGNOSIS — Z96652 Presence of left artificial knee joint: Secondary | ICD-10-CM

## 2019-12-10 DIAGNOSIS — M19049 Primary osteoarthritis, unspecified hand: Secondary | ICD-10-CM | POA: Diagnosis not present

## 2019-12-10 DIAGNOSIS — G5601 Carpal tunnel syndrome, right upper limb: Secondary | ICD-10-CM | POA: Diagnosis not present

## 2019-12-10 MED ORDER — DICLOFENAC POTASSIUM 50 MG PO TABS: 50.0000 mg | ORAL_TABLET | Freq: Two times a day (BID) | ORAL | 3 refills | Status: DC

## 2019-12-10 NOTE — Patient Instructions (Signed)
Wear carpal tunnel brace as much as posssible  Start Vit B6 100 mg BID   The Aleggrias should be ok for the shin pain

## 2019-12-10 NOTE — Progress Notes (Signed)
Chief Complaint  Patient presents with  . Post-op Follow-up    05/27/19 left 10/29/18 right    Problem 1 recheck left knee total knee right knee total knee dates as noted  Problem 2 new onset numbness tingling right hand.  Patient says she has to put her hand down has to shake her hand to get the feeling back.  Symptoms seem to be from the wrist down no neck pain is noted  Her total knees are doing well she is able to walk well she is able to work  Right hand decreased sensation noted in the median nerve distribution color and capillary refill are normal range of motion is good wrist is stable  Encounter Diagnoses  Name Primary?  . S/P total knee replacement, right 10/29/18 Yes  . S/P TKR (total knee replacement), left 05/27/2019   . Hand arthritis     Past Medical History:  Diagnosis Date  . Chronic knee pain    arthritis  . Fever blister    history of  . History of kidney stones   . Hypertension   . Migraines   . Morbid obesity with BMI of 40.0-44.9, adult ([place])   . 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 Oral herpes   . Osteoarthritis   . Restless leg   . Sleep apnea    per pt not treated; not able to use CPAP due to claustrophobia  . Vitamin D deficiency     We do not note any diabetes or rheumatoid arthritis or thyroid disease to contribute to these carpal tunnel seems to be related to the right ankle  Recommend bracing and B6.  She did have an allergic reaction to Lyrica and she took Benadryl with the Lyrica for numbness stay away from the gabapentin  Come back in 6 weeks check carpal tunnel  Acute uncomplicated illness carpal tunnel 2 stable chronic illnesses knee arthritis status post knee replacement  No prescriptions

## 2019-12-16 ENCOUNTER — Ambulatory Visit
Admission: RE | Admit: 2019-12-16 | Discharge: 2019-12-16 | Disposition: A | Discharge status: Home / Self Care | Payer: 59 | Source: Ambulatory Visit | Attending: Primary Care | Admitting: Primary Care

## 2019-12-16 DIAGNOSIS — Z1231 Encounter for screening mammogram for malignant neoplasm of breast: Secondary | ICD-10-CM | POA: Insufficient documentation

## 2020-01-15 ENCOUNTER — Ambulatory Visit (INDEPENDENT_AMBULATORY_CARE_PROVIDER_SITE_OTHER): Payer: 59 | Admitting: Vascular Surgery

## 2020-01-18 DIAGNOSIS — I10 Essential (primary) hypertension: Secondary | ICD-10-CM

## 2020-01-19 MED ORDER — HYDROCHLOROTHIAZIDE 25 MG PO TABS: 25.0000 mg | ORAL_TABLET | Freq: Every day | ORAL | 1 refills | Status: DC

## 2020-01-22 ENCOUNTER — Ambulatory Visit (INDEPENDENT_AMBULATORY_CARE_PROVIDER_SITE_OTHER): Payer: 59 | Admitting: Vascular Surgery

## 2020-01-26 ENCOUNTER — Ambulatory Visit: Payer: 59 | Admitting: Orthopedic Surgery

## 2020-02-05 ENCOUNTER — Ambulatory Visit (INDEPENDENT_AMBULATORY_CARE_PROVIDER_SITE_OTHER): Payer: 59 | Admitting: Vascular Surgery

## 2020-02-05 ENCOUNTER — Encounter (INDEPENDENT_AMBULATORY_CARE_PROVIDER_SITE_OTHER): Payer: Self-pay | Admitting: Vascular Surgery

## 2020-02-05 ENCOUNTER — Other Ambulatory Visit: Payer: Self-pay

## 2020-02-05 VITALS — BP 149/85 | HR 72 | Ht 64.0 in | Wt 250.0 lb

## 2020-02-05 DIAGNOSIS — I83812 Varicose veins of left lower extremities with pain: Secondary | ICD-10-CM | POA: Diagnosis not present

## 2020-02-05 DIAGNOSIS — I83813 Varicose veins of bilateral lower extremities with pain: Secondary | ICD-10-CM

## 2020-02-06 ENCOUNTER — Ambulatory Visit: Payer: 59 | Admitting: Orthopedic Surgery

## 2020-02-12 ENCOUNTER — Encounter (INDEPENDENT_AMBULATORY_CARE_PROVIDER_SITE_OTHER): Payer: Self-pay | Admitting: Vascular Surgery

## 2020-02-12 NOTE — Progress Notes (Signed)
Amy Mooney is a 54 y.o.female who presents with painful varicose veins of the left leg  Past Medical History:  Diagnosis Date  . Chronic knee pain    arthritis  . Fever blister    history of  . History of kidney stones   . Hypertension   . Migraines   . Morbid obesity with BMI of 40.0-44.9, adult ([place])   . 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 Oral herpes   . Osteoarthritis   . Restless leg   . Sleep apnea    per pt not treated; not able to use CPAP due to claustrophobia  . Vitamin D deficiency     Past Surgical History:  Procedure Laterality Date  . ABDOMINAL HYSTERECTOMY  2010  . artroscopic meniscus repair Bilateral   . cervix removed, bladder tack     2015  . COLONOSCOPY WITH PROPOFOL N/A 06/19/2017   Procedure: COLONOSCOPY WITH PROPOFOL;  Surgeon: [name], MD;  Location: [facility] ENDOSCOPY;  Service: Endoscopy;  Laterality: N/A;  . LAPAROSCOPIC SALPINGO OOPHERECTOMY Right 03/22/2017   Procedure: LAPAROSCOPIC SALPINGO OOPHORECTOMY;  Surgeon: Ward, Honor Loh, MD;  Location: [facility] ORS;  Service: Gynecology;  Laterality: Right;  . TOTAL KNEE ARTHROPLASTY Right 10/29/2018   Procedure: TOTAL KNEE ARTHROPLASTY;  Surgeon: Carole Civil, MD;  Location: [facility] ORS;  Service: Orthopedics;  Laterality: Right;  . TOTAL KNEE ARTHROPLASTY Left 05/27/2019   Procedure: TOTAL KNEE ARTHROPLASTY;  Surgeon: Carole Civil, MD;  Location: [facility] ORS;  Service: Orthopedics;  Laterality: Left;    Current Outpatient Medications  Medication Sig Dispense Refill  . Calcium Carb-Cholecalciferol (CALCIUM + D3 PO) Take 1 tablet by mouth at bedtime.    . Cholecalciferol (VITAMIN D3) 5000 units CAPS Take 5,000 Units by mouth at bedtime.     . clindamycin (CLEOCIN) 150 MG capsule 300 MG HALF HOUR BEFORE CLEANING 40 capsule 0  . diclofenac (CATAFLAM) 50 MG tablet Take 1 tablet (50 mg total) by mouth 2 (two) times daily. 90 tablet 3  . doxycycline (VIBRA-TABS) 100 MG tablet Take 100 mg by mouth  daily as needed (acne).     . fluconazole (DIFLUCAN) 100 MG tablet Take 100 mg by mouth daily as needed (FINGER).     . hydrochlorothiazide (HYDRODIURIL) 25 MG tablet Take 1 tablet (25 mg total) by mouth daily. For blood pressure. 90 tablet 1  . Insulin Pen Needle (BD PEN NEEDLE NANO 2ND GEN) 32G X 4 MM MISC Use one needle daily for Saxenda injection. 100 each 0  . Liraglutide -Weight Management (SAXENDA) 18 MG/3ML SOPN Inject 3 mg into the skin daily. 5 pen 0  . loratadine (CLARITIN) 10 MG tablet Take 10 mg by mouth daily as needed for allergies.    . methocarbamol (ROBAXIN) 500 MG tablet Take 1 tablet (500 mg total) by mouth every 6 (six) hours as needed for muscle spasms. (Patient not taking: Reported on 12/10/2019) 56 tablet 1  . Multiple Vitamins-Minerals (HAIR SKIN AND NAILS FORMULA PO) Take 1 tablet by mouth daily.    . Multiple Vitamins-Minerals (MULTIVITAMIN WITH MINERALS) tablet Take 1 tablet by mouth daily.    . pramipexole (MIRAPEX) 0.25 MG tablet TAKE 1 TABLET BY MOUTH 30 MINUTES TO 1 HOUR PRIOR TO BEDTIME (Patient not taking: Reported on 02/05/2020) 90 tablet 3  . rizatriptan (MAXALT) 10 MG tablet TAKE 1 TABLET BY MOUTH AT MIGRAINE ONSET MAY REPEAT IN 2 HOURS IF HEADACHE PERSISTS.NOT EXCEED 2 TABLETS IN 24HRS 10 tablet 0  .  TARGADOX 50 MG TABS Take 1 tablet by mouth 2 (two) times daily.    . valACYclovir (VALTREX) 1000 MG tablet Take 1,000 mg by mouth 2 (two) times daily as needed (fever blisters/cold sores.).      No current facility-administered medications for this visit.    Allergies  Allergen Reactions  . Nuvigil [Armodafinil] Hives  . Penicillins Rash and Other (See Comments)    Has patient had a PCN reaction causing immediate rash, facial/tongue/throat swelling, SOB or lightheadedness with hypotension: Unknown Has patient had a PCN reaction causing severe rash involving mucus membranes or skin necrosis: Unknown Has patient had a PCN reaction that required hospitalization:  Unknown Has patient had a PCN reaction occurring within the last 10 years: Unknown If all of the above answers are "NO", then may proceed with Cephalosporin use.   . Vancomycin Hives  . Lyrica [Pregabalin] Hives  . Bupropion Other (See Comments)    Causes migraines    Indication: Patient presents with symptomatic varicose veins of the left lower extremity.  Procedure: Foam sclerotherapy was performed on the left lower extremity. Using ultrasound guidance, 5 mL of foam Sotradecol was used to inject the varicosities of the left lower extremity. Compression wraps were placed. The patient tolerated the procedure well.

## 2020-03-04 ENCOUNTER — Ambulatory Visit (INDEPENDENT_AMBULATORY_CARE_PROVIDER_SITE_OTHER): Payer: 59 | Admitting: Vascular Surgery

## 2020-04-05 ENCOUNTER — Ambulatory Visit: Payer: 59 | Admitting: Orthopedic Surgery

## 2020-04-05 ENCOUNTER — Telehealth: Payer: Self-pay

## 2020-04-05 MED ORDER — AZITHROMYCIN 1 G PO PACK: 1.0000 | PACK | Freq: Once | ORAL | 0 refills | Status: AC

## 2020-04-05 NOTE — Telephone Encounter (Signed)
OK per Dr. Nicole Kindred

## 2020-04-08 ENCOUNTER — Ambulatory Visit (INDEPENDENT_AMBULATORY_CARE_PROVIDER_SITE_OTHER): Payer: 59 | Admitting: Vascular Surgery

## 2020-04-14 ENCOUNTER — Encounter: Payer: Self-pay | Admitting: Family Medicine

## 2020-04-14 ENCOUNTER — Other Ambulatory Visit: Payer: Self-pay

## 2020-04-14 ENCOUNTER — Ambulatory Visit: Payer: 59 | Admitting: Family Medicine

## 2020-04-14 VITALS — BP 120/80 | HR 65 | Temp 98.8°F | Ht 63.0 in | Wt 251.0 lb

## 2020-04-14 DIAGNOSIS — M5416 Radiculopathy, lumbar region: Secondary | ICD-10-CM | POA: Diagnosis not present

## 2020-04-14 DIAGNOSIS — Z6841 Body Mass Index (BMI) 40.0 and over, adult: Secondary | ICD-10-CM

## 2020-04-14 MED ORDER — PREDNISONE 20 MG PO TABS: ORAL_TABLET | ORAL | 0 refills | Status: DC

## 2020-04-14 MED ORDER — CYCLOBENZAPRINE HCL 10 MG PO TABS: 5.0000 mg | ORAL_TABLET | Freq: Every day | ORAL | 1 refills | Status: DC

## 2020-04-14 NOTE — Progress Notes (Signed)
Hanah Moultry T. Navia Lindahl, MD, [place]  [facility] and [place] at [facility] [place], [address]  Phone: [phone]  FAX: [phone]  Amy Mooney - 54 y.o. female  MRN [id number]  Date of Birth: 1966/01/13  Date: 04/14/2020  PCP: [name], NP  Referral: [name], NP  Chief Complaint  Patient presents with  . Hip Pain    Right-radiates down leg-Bilateral knee replacement last year    This visit occurred during the SARS-CoV-2 public health emergency.  Safety protocols were in place, including screening questions prior to the visit, additional usage of staff PPE, and extensive cleaning of exam room while observing appropriate contact time as indicated for disinfecting solutions.   Subjective:   Amy Mooney is a 54 y.o. very pleasant female patient who presents with the following: Back Pain  ongoing for approximately: 2 days The patient has had back pain before. The back pain is localized into the lumbar spine area. They also describe right son radiculopathy.  Started yesterday Posterior buttocks pain and then started to go down her knee.  Took some robaxin.  This did not help mch at all.  OK this morning.   She describes this is hip pain, but she has no pain in the groin and pain is primarily in her lower back as well as her right-sided posterior buttocks.  Works for Dr. [employer]  No groin pain.  Doing better some now.  2020 - knee replacements.    No numbness or tingling. No bowel or bladder incontinence. No focal weakness. Prior interventions: None Physical therapy: No Chiropractic manipulations: No Acupuncture: No Osteopathic manipulation: No Heat or cold: Minimal effect   Review of Systems is noted in the HPI, as appropriate  Objective:   Blood pressure 120/80, pulse 65, temperature 98.8 F (37.1 C), temperature source Temporal, height 5\' 3"  (1.6 m), weight  251 lb (113.9 kg), SpO2 97 %.  GEN: No acute distress; alert,appropriate. PULM: Breathing comfortably in no respiratory distress PSYCH: Normally interactive.   Range of motion at  the waist: Flexion, rotation and lateral bending: Minimal restriction on motion have to  No echymosis or edema Rises to examination table with no difficulty Gait: minimally antalgic  Inspection/Deformity: No abnormality Paraspinus T: Bilateral pain L4-S1  B Ankle Dorsiflexion (L5,4): 5/5 B Great Toe Dorsiflexion (L5,4): 5/5 Heel Walk (L5): WNL Toe Walk (S1): WNL Rise/Squat (L4): WNL, mild pain  SENSORY B Medial Foot (L4): WNL B Dorsum (L5): WNL B Lateral (S1): WNL Light Touch: WNL Pinprick: WNL  REFLEXES Knee (L4): 2+ Ankle (S1): 2+  B SLR, seated: neg B SLR, supine: neg B FABER: neg B Reverse FABER: neg B Greater Troch: NT B Log Roll: neg B Stork: NT B Sciatic Notch: NT  Radiology: No results found.  Assessment and Plan:     ICD-10-CM   1. Lumbar radiculopathy, acute  M54.16   2. Class 3 severe obesity due to excess calories with serious comorbidity and body mass index (BMI) of 40.0 to 44.9 in adult [facility])  E66.01    Z68.41     Level of Medical Decision-Making in this case is MODERATE.  Anatomy reviewed. Conservative algorithms for acute back pain generally begin with the following: NSAIDS, Muscle Relaxants, Mild pain medication Start with medications, basic rehab, and progress from there following low back pain algorithm. No red flags are present.  Start with ibuprofen 3 times a  day.  I have given her some steroids to keep at hand in case things worsen over the next week, and think it is entirely reasonable for her to start them at that point.  Follow-up: No follow-ups on file.  Meds ordered this encounter  Medications  . cyclobenzaprine (FLEXERIL) 10 MG tablet    Sig: Take 0.5-1 tablets (5-10 mg total) by mouth at bedtime.    Dispense:  30 tablet    Refill:  1  .  predniSONE (DELTASONE) 20 MG tablet    Sig: 2 tabs po daily for 5 days, then 1 tab po daily for 5 days    Dispense:  15 tablet    Refill:  0   Medications Discontinued During This Encounter  Medication Reason  . Insulin Pen Needle (BD PEN NEEDLE NANO 2ND GEN) 32G X 4 MM MISC Completed Course  . doxycycline (VIBRA-TABS) 100 MG tablet Completed Course  . Liraglutide -Weight Management (SAXENDA) 18 MG/3ML SOPN Completed Course  . methocarbamol (ROBAXIN) 500 MG tablet Completed Course   No orders of the defined types were placed in this encounter.   Signed,  Maud Deed. Antone Summons, MD   Outpatient Encounter Medications as of 04/14/2020  Medication Sig  . Calcium Carb-Cholecalciferol (CALCIUM + D3 PO) Take 1 tablet by mouth at bedtime.  . Cholecalciferol (VITAMIN D3) 5000 units CAPS Take 5,000 Units by mouth at bedtime.   . clindamycin (CLEOCIN) 150 MG capsule 300 MG HALF HOUR BEFORE CLEANING  . diclofenac (CATAFLAM) 50 MG tablet Take 1 tablet (50 mg total) by mouth 2 (two) times daily.  . fluconazole (DIFLUCAN) 100 MG tablet Take 100 mg by mouth daily as needed (FINGER).   . hydrochlorothiazide (HYDRODIURIL) 25 MG tablet Take 1 tablet (25 mg total) by mouth daily. For blood pressure.  . loratadine (CLARITIN) 10 MG tablet Take 10 mg by mouth daily as needed for allergies.  . Multiple Vitamins-Minerals (HAIR SKIN AND NAILS FORMULA PO) Take 1 tablet by mouth daily.  . Multiple Vitamins-Minerals (MULTIVITAMIN WITH MINERALS) tablet Take 1 tablet by mouth daily.  . pramipexole (MIRAPEX) 0.25 MG tablet TAKE 1 TABLET BY MOUTH 30 MINUTES TO 1 HOUR PRIOR TO BEDTIME  . rizatriptan (MAXALT) 10 MG tablet TAKE 1 TABLET BY MOUTH AT MIGRAINE ONSET MAY REPEAT IN 2 HOURS IF HEADACHE PERSISTS.NOT EXCEED 2 TABLETS IN 24HRS  . TARGADOX 50 MG TABS Take 1 tablet by mouth 2 (two) times daily.  . valACYclovir (VALTREX) 1000 MG tablet Take 1,000 mg by mouth 2 (two) times daily as needed (fever blisters/cold sores.).    Marland Kitchen cyclobenzaprine (FLEXERIL) 10 MG tablet Take 0.5-1 tablets (5-10 mg total) by mouth at bedtime.  . predniSONE (DELTASONE) 20 MG tablet 2 tabs po daily for 5 days, then 1 tab po daily for 5 days  . [DISCONTINUED] doxycycline (VIBRA-TABS) 100 MG tablet Take 100 mg by mouth daily as needed (acne).   . [DISCONTINUED] Insulin Pen Needle (BD PEN NEEDLE NANO 2ND GEN) 32G X 4 MM MISC Use one needle daily for Saxenda injection.  . [DISCONTINUED] Liraglutide -Weight Management (SAXENDA) 18 MG/3ML SOPN Inject 3 mg into the skin daily.  . [DISCONTINUED] methocarbamol (ROBAXIN) 500 MG tablet Take 1 tablet (500 mg total) by mouth every 6 (six) hours as needed for muscle spasms. (Patient not taking: Reported on 12/10/2019)   No facility-administered encounter medications on file as of 04/14/2020.

## 2020-04-27 ENCOUNTER — Encounter: Payer: Self-pay | Admitting: Orthopedic Surgery

## 2020-04-27 ENCOUNTER — Other Ambulatory Visit: Payer: Self-pay

## 2020-04-27 ENCOUNTER — Ambulatory Visit: Payer: 59

## 2020-04-27 ENCOUNTER — Ambulatory Visit (INDEPENDENT_AMBULATORY_CARE_PROVIDER_SITE_OTHER): Payer: 59 | Admitting: Orthopedic Surgery

## 2020-04-27 VITALS — BP 131/73 | HR 67 | Ht 63.0 in | Wt 242.0 lb

## 2020-04-27 DIAGNOSIS — M25562 Pain in left knee: Secondary | ICD-10-CM

## 2020-04-27 DIAGNOSIS — Z96651 Presence of right artificial knee joint: Secondary | ICD-10-CM

## 2020-04-27 DIAGNOSIS — Z96652 Presence of left artificial knee joint: Secondary | ICD-10-CM

## 2020-04-27 DIAGNOSIS — G8929 Other chronic pain: Secondary | ICD-10-CM | POA: Diagnosis not present

## 2020-04-27 DIAGNOSIS — M1710 Unilateral primary osteoarthritis, unspecified knee: Secondary | ICD-10-CM

## 2020-04-27 DIAGNOSIS — M1711 Unilateral primary osteoarthritis, right knee: Secondary | ICD-10-CM | POA: Diagnosis not present

## 2020-04-27 NOTE — Progress Notes (Signed)
Chief Complaint  Patient presents with  . Knee Pain    Left knee pain for 3 wks. TKR 05/27/19    54 year old female had both total knees done one in January and one in August 2020 comes in complaining of onset of acute left knee and leg pain which is now gone away.  The pain was in the front of the knee was noted when she took a step it was very severe at the time.  She did not take any medicine  Review of systems right leg radicular pain did have a bout of sciatica may or may not have been contributory  BP 131/73   Pulse 67   Ht 5\' 3"  (1.6 m)   Wt 242 lb (109.8 kg)   BMI 42.87 kg/m   Normal development grooming and hygiene  The patient meets the AMA guidelines for Morbid (severe) obesity with a BMI > 40.0 and I have recommended weight loss.  No pain in the knee both knees move well are stable and have normal x-rays  She may have some bursitis may has had some tendinitis in the patellar tendon however now doing well  Recommend yearly follow-ups on both knees in January  Addendum review of systems patient having numbness tingling and pain right wrist consistent with carpal tunnel syndrome wants to address on a different visit

## 2020-04-29 ENCOUNTER — Ambulatory Visit (INDEPENDENT_AMBULATORY_CARE_PROVIDER_SITE_OTHER): Payer: 59 | Admitting: Vascular Surgery

## 2020-04-29 ENCOUNTER — Encounter (INDEPENDENT_AMBULATORY_CARE_PROVIDER_SITE_OTHER): Payer: Self-pay | Admitting: Vascular Surgery

## 2020-04-29 ENCOUNTER — Other Ambulatory Visit: Payer: Self-pay

## 2020-04-29 VITALS — BP 119/79 | HR 65 | Ht 63.0 in | Wt 236.0 lb

## 2020-04-29 DIAGNOSIS — I83813 Varicose veins of bilateral lower extremities with pain: Secondary | ICD-10-CM

## 2020-04-29 DIAGNOSIS — I83812 Varicose veins of left lower extremities with pain: Secondary | ICD-10-CM | POA: Diagnosis not present

## 2020-04-29 NOTE — Progress Notes (Signed)
    MRN : [id number]  Amy Mooney is a 54 y.o. (May 18, 1966) female who presents with chief complaint of  Chief Complaint  Patient presents with  . Follow-up    foam sclero  .   Procedure:  Sclerotherapy using foam mixed with 1% Lidocaine was performed on lower extremities left.  Compression wraps were placed.  The patient tolerated the procedure well.  Plan:  Follow up as arranged

## 2020-05-31 ENCOUNTER — Ambulatory Visit: Payer: Self-pay | Attending: Internal Medicine

## 2020-05-31 DIAGNOSIS — Z23 Encounter for immunization: Secondary | ICD-10-CM

## 2020-05-31 NOTE — Progress Notes (Signed)
   Covid-19 Vaccination Clinic  Name:  Amy Mooney    MRN: [id number] DOB: January 19, 1966  05/31/2020  [name] was observed post Covid-19 immunization for 15 minutes without incident. She was provided with Vaccine Information Sheet and instruction to access the V-Safe system.   [name] was instructed to call 911 with any severe reactions post vaccine: Marland Kitchen Difficulty breathing  . Swelling of face and throat  . A fast heartbeat  . A bad rash all over body  . Dizziness and weakness   Immunizations Administered    Name Date Dose VIS Date Route   Moderna COVID-19 Vaccine 05/31/2020  5:15 PM 0.5 mL 09/2019 Intramuscular   Manufacturer: Levan Hurst   Lot: [id number]   [place]: [id number]

## 2020-06-09 ENCOUNTER — Other Ambulatory Visit: Payer: Self-pay

## 2020-06-09 MED ORDER — DESOXIMETASONE 0.05 % EX GEL: 1.0000 "application " | Freq: Every day | CUTANEOUS | 1 refills | Status: DC

## 2020-06-09 NOTE — Progress Notes (Signed)
RX refill

## 2020-06-21 DIAGNOSIS — H5213 Myopia, bilateral: Secondary | ICD-10-CM | POA: Diagnosis not present

## 2020-07-05 ENCOUNTER — Ambulatory Visit: Payer: 59 | Attending: Internal Medicine

## 2020-07-05 ENCOUNTER — Ambulatory Visit: Payer: 59

## 2020-07-05 DIAGNOSIS — Z23 Encounter for immunization: Secondary | ICD-10-CM

## 2020-07-05 NOTE — Progress Notes (Signed)
   Covid-19 Vaccination Clinic  Name:  Amy Mooney    MRN: [id number] DOB: Jun 03, 1966  07/05/2020  [name] was observed post Covid-19 immunization for 15 minutes without incident. She was provided with Vaccine Information Sheet and instruction to access the V-Safe system.   [name] was instructed to call 911 with any severe reactions post vaccine: Marland Kitchen Difficulty breathing  . Swelling of face and throat  . A fast heartbeat  . A bad rash all over body  . Dizziness and weakness   Immunizations Administered    Name Date Dose VIS Date Route   Moderna COVID-19 Vaccine 07/05/2020  3:05 PM 0.5 mL 09/2019 Intramuscular   Manufacturer: Moderna   Lot: [id number]   [place]: [id number]

## 2020-07-09 ENCOUNTER — Other Ambulatory Visit: Payer: Self-pay | Admitting: Primary Care

## 2020-07-09 DIAGNOSIS — G2581 Restless legs syndrome: Secondary | ICD-10-CM

## 2020-07-09 DIAGNOSIS — I10 Essential (primary) hypertension: Secondary | ICD-10-CM

## 2020-07-12 ENCOUNTER — Other Ambulatory Visit: Payer: Self-pay | Admitting: Internal Medicine

## 2020-07-12 NOTE — Telephone Encounter (Signed)
See where you have given Mirapex in the past but not seen in office by your for RLS? Ok to refill?

## 2020-08-02 DIAGNOSIS — H43812 Vitreous degeneration, left eye: Secondary | ICD-10-CM | POA: Diagnosis not present

## 2020-08-11 DIAGNOSIS — H43812 Vitreous degeneration, left eye: Secondary | ICD-10-CM | POA: Diagnosis not present

## 2020-08-17 ENCOUNTER — Other Ambulatory Visit: Payer: Self-pay

## 2020-08-17 MED ORDER — JUBLIA 10 % EX SOLN
1.0000 | Freq: Every day | CUTANEOUS | 11 refills | Status: DC
Start: 2020-08-17 — End: 2021-10-28

## 2020-08-17 NOTE — Progress Notes (Signed)
Jublia qhs to affected nails 32ml 11 rf sent to Your rx./sh

## 2020-08-19 ENCOUNTER — Other Ambulatory Visit: Payer: Self-pay | Admitting: Primary Care

## 2020-08-19 DIAGNOSIS — I10 Essential (primary) hypertension: Secondary | ICD-10-CM

## 2020-08-19 DIAGNOSIS — R7303 Prediabetes: Secondary | ICD-10-CM

## 2020-08-19 DIAGNOSIS — D649 Anemia, unspecified: Secondary | ICD-10-CM

## 2020-08-19 DIAGNOSIS — E782 Mixed hyperlipidemia: Secondary | ICD-10-CM

## 2020-08-19 DIAGNOSIS — E559 Vitamin D deficiency, unspecified: Secondary | ICD-10-CM

## 2020-08-27 ENCOUNTER — Other Ambulatory Visit: Payer: Self-pay

## 2020-08-27 ENCOUNTER — Other Ambulatory Visit (INDEPENDENT_AMBULATORY_CARE_PROVIDER_SITE_OTHER): Payer: 59

## 2020-08-27 DIAGNOSIS — E559 Vitamin D deficiency, unspecified: Secondary | ICD-10-CM | POA: Diagnosis not present

## 2020-08-27 DIAGNOSIS — E782 Mixed hyperlipidemia: Secondary | ICD-10-CM | POA: Diagnosis not present

## 2020-08-27 DIAGNOSIS — I10 Essential (primary) hypertension: Secondary | ICD-10-CM | POA: Diagnosis not present

## 2020-08-27 DIAGNOSIS — D649 Anemia, unspecified: Secondary | ICD-10-CM

## 2020-08-27 DIAGNOSIS — R7303 Prediabetes: Secondary | ICD-10-CM | POA: Diagnosis not present

## 2020-08-27 LAB — CBC
HCT: 39.4 % (ref 36.0–46.0)
Hemoglobin: 13 g/dL (ref 12.0–15.0)
MCHC: 32.9 g/dL (ref 30.0–36.0)
MCV: 79.2 fl (ref 78.0–100.0)
Platelets: 219 10*3/uL (ref 150.0–400.0)
RBC: 4.98 Mil/uL (ref 3.87–5.11)
RDW: 15.2 % (ref 11.5–15.5)
WBC: 8.4 10*3/uL (ref 4.0–10.5)

## 2020-08-27 LAB — COMPREHENSIVE METABOLIC PANEL
ALT: 20 U/L (ref 0–35)
AST: 20 U/L (ref 0–37)
Albumin: 4.2 g/dL (ref 3.5–5.2)
Alkaline Phosphatase: 98 U/L (ref 39–117)
BUN: 19 mg/dL (ref 6–23)
CO2: 31 mEq/L (ref 19–32)
Calcium: 9.8 mg/dL (ref 8.4–10.5)
Chloride: 101 mEq/L (ref 96–112)
Creatinine, Ser: 0.89 mg/dL (ref 0.40–1.20)
GFR: 73.62 mL/min (ref >60.00)
Glucose, Bld: 86 mg/dL (ref 70–99)
Potassium: 3.7 mEq/L (ref 3.5–5.1)
Sodium: 140 mEq/L (ref 135–145)
Total Bilirubin: 0.6 mg/dL (ref 0.2–1.2)
Total Protein: 7.1 g/dL (ref 6.0–8.3)

## 2020-08-27 LAB — HEMOGLOBIN A1C: Hgb A1c MFr Bld: 6.1 % (ref 4.6–6.5)

## 2020-08-27 LAB — LIPID PANEL
Cholesterol: 159 mg/dL (ref 0–200)
HDL: 50.6 mg/dL (ref >39.00)
LDL Cholesterol: 83 mg/dL (ref 0–99)
NonHDL: 108.61
Total CHOL/HDL Ratio: 3
Triglycerides: 130 mg/dL (ref 0.0–149.0)
VLDL: 26 mg/dL (ref 0.0–40.0)

## 2020-08-27 LAB — VITAMIN D 25 HYDROXY (VIT D DEFICIENCY, FRACTURES): VITD: 50.04 ng/mL (ref 30.00–100.00)

## 2020-09-03 ENCOUNTER — Other Ambulatory Visit: Payer: Self-pay

## 2020-09-03 ENCOUNTER — Encounter: Payer: Self-pay | Admitting: Primary Care

## 2020-09-03 ENCOUNTER — Ambulatory Visit (INDEPENDENT_AMBULATORY_CARE_PROVIDER_SITE_OTHER): Payer: 59 | Admitting: Primary Care

## 2020-09-03 VITALS — BP 122/76 | HR 72 | Temp 97.5°F | Ht 63.0 in | Wt 246.4 lb

## 2020-09-03 DIAGNOSIS — Z0000 Encounter for general adult medical examination without abnormal findings: Secondary | ICD-10-CM

## 2020-09-03 DIAGNOSIS — G43009 Migraine without aura, not intractable, without status migrainosus: Secondary | ICD-10-CM | POA: Diagnosis not present

## 2020-09-03 DIAGNOSIS — B351 Tinea unguium: Secondary | ICD-10-CM | POA: Diagnosis not present

## 2020-09-03 DIAGNOSIS — K635 Polyp of colon: Secondary | ICD-10-CM | POA: Diagnosis not present

## 2020-09-03 DIAGNOSIS — E8881 Metabolic syndrome: Secondary | ICD-10-CM | POA: Diagnosis not present

## 2020-09-03 DIAGNOSIS — E559 Vitamin D deficiency, unspecified: Secondary | ICD-10-CM

## 2020-09-03 DIAGNOSIS — G2581 Restless legs syndrome: Secondary | ICD-10-CM

## 2020-09-03 DIAGNOSIS — I10 Essential (primary) hypertension: Secondary | ICD-10-CM

## 2020-09-03 DIAGNOSIS — M25562 Pain in left knee: Secondary | ICD-10-CM

## 2020-09-03 DIAGNOSIS — M25561 Pain in right knee: Secondary | ICD-10-CM | POA: Diagnosis not present

## 2020-09-03 DIAGNOSIS — D649 Anemia, unspecified: Secondary | ICD-10-CM

## 2020-09-03 DIAGNOSIS — G4733 Obstructive sleep apnea (adult) (pediatric): Secondary | ICD-10-CM

## 2020-09-03 DIAGNOSIS — F3289 Other specified depressive episodes: Secondary | ICD-10-CM

## 2020-09-03 DIAGNOSIS — G47419 Narcolepsy without cataplexy: Secondary | ICD-10-CM

## 2020-09-03 DIAGNOSIS — G8929 Other chronic pain: Secondary | ICD-10-CM

## 2020-09-03 DIAGNOSIS — E7849 Other hyperlipidemia: Secondary | ICD-10-CM

## 2020-09-03 HISTORY — DX: Tinea unguium: B35.1

## 2020-09-03 NOTE — Assessment & Plan Note (Signed)
A1C today of 6.1 which is an increase from 5.6 one year ago.   Strongly advised to work on weight loss through diet and exercise. Family history of diabetes.   Repeat A1C in 6 months.

## 2020-09-03 NOTE — Assessment & Plan Note (Signed)
Overall infrequent migraines. No recent use of rizatriptan.  Continue to monitor.

## 2020-09-03 NOTE — Assessment & Plan Note (Addendum)
Well controlled in the office today, continue HCTZ 25 mg daily. CMP reviewed.

## 2020-09-03 NOTE — Assessment & Plan Note (Signed)
Diagnosed years ago, intolerant to CPAP machine as she is claustrophobic.

## 2020-09-03 NOTE — Assessment & Plan Note (Addendum)
Chronic, superficial white tinea.  Follows with dermatology. Doing well with PRN fluconazole and Jublia.

## 2020-09-03 NOTE — Assessment & Plan Note (Signed)
Due for repeat colon cancer screening in 2028.

## 2020-09-03 NOTE — Progress Notes (Signed)
Subjective:    Patient ID: Amy Mooney, female    DOB: 11/01/1965, 54 y.o.   MRN: [id number]  HPI  This visit occurred during the SARS-CoV-2 public health emergency.  Safety protocols were in place, including screening questions prior to the visit, additional usage of staff PPE, and extensive cleaning of exam room while observing appropriate contact time as indicated for disinfecting solutions.   [name] is a 54 year old female who presents today for complete physical.  Immunizations: -Tetanus: Completed in 2017 -Influenza: Completed this season  -Shingles: Never completed  -Covid-19: Completed series   Diet: She endorses a poor diet. Exercise: She is not exercising.   Eye exam: Completes annually  Dental exam: Completes semi-annually   Pap Smear: Hysterectomy  Mammogram: Completed in 2021 Colonoscopy: Completed in 2018, due in 2028 Hep C Screen: Due  BP Readings from Last 3 Encounters:  09/03/20 122/76  04/29/20 119/79  04/27/20 131/73     Review of Systems  Constitutional: Negative for unexpected weight change.  HENT: Negative for rhinorrhea.   Eyes: Negative for visual disturbance.  Respiratory: Negative for cough and shortness of breath.   Cardiovascular: Negative for chest pain.  Gastrointestinal: Negative for constipation and diarrhea.  Genitourinary: Negative for difficulty urinating.  Musculoskeletal: Positive for arthralgias. Negative for myalgias.       To hands mostly, overall knees have improved  Skin: Negative for rash.  Allergic/Immunologic: Negative for environmental allergies.  Neurological: Negative for dizziness, numbness and headaches.  Psychiatric/Behavioral: The patient is not nervous/anxious.        Some depression, overall        Past Medical History:  Diagnosis Date   Allergy    Chronic knee pain    arthritis   Fever blister    history of   History of kidney stones    Hypertension    Migraines    Morbid obesity with BMI  of 40.0-44.9, adult (HCC)    Narcolepsy without cataplexy(347.00)    Oral herpes    Osteoarthritis    Restless leg    Sleep apnea    per pt not treated; not able to use CPAP due to claustrophobia   Vitamin D deficiency      Social History   Socioeconomic History   Marital status: Married    Spouse name: Amy Mooney   Number of children: 2   Years of education: College   Highest education level: Not on file  Occupational History   Occupation: CMA    Employer: [employer]  Tobacco Use   Smoking status: Former Smoker    Packs/day: 0.50    Years: 4.00    Pack years: 2.00    Quit date: 10/23/1986    Years since quitting: 33.8   Smokeless tobacco: Never Used  Vaping Use   Vaping Use: Never used  Substance and Sexual Activity   Alcohol use: No   Drug use: No   Sexual activity: Yes    Birth control/protection: None  Other Topics Concern   Not on file  Social History Narrative   Patient lives at home with her husband Amy Mooney)   Patient has two adult children.   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   Patient has an college education.   Patient is right-handed.   Patient drinks very little caffeine.   Enjoys spending time with family.    Social Determinants of Health   Financial Resource Strain:    Difficulty of Paying  Living Expenses: Not on file  Food Insecurity:    Worried About Cherry in the Last Year: Not on file   Ran Out of Food in the Last Year: Not on file  Transportation Needs:    Lack of Transportation (Medical): Not on file   Lack of Transportation (Non-Medical): Not on file  Physical Activity:    Days of Exercise per Week: Not on file   Minutes of Exercise per Session: Not on file  Stress:    Feeling of Stress : Not on file  Social Connections:    Frequency of Communication with Friends and Family: Not on file   Frequency of Social Gatherings with Friends and Family: Not on file   Attends  Religious Services: Not on file   Active Member of Clubs or Organizations: Not on file   Attends Archivist Meetings: Not on file   Marital Status: Not on file  Intimate Partner Violence:    Fear of Current or Ex-Partner: Not on file   Emotionally Abused: Not on file   Physically Abused: Not on file   Sexually Abused: Not on file    Past Surgical History:  Procedure Laterality Date   ABDOMINAL HYSTERECTOMY  2010   artroscopic meniscus repair Bilateral    cervix removed, bladder tack     2015   COLONOSCOPY WITH PROPOFOL N/A 06/19/2017   Procedure: COLONOSCOPY WITH PROPOFOL;  Surgeon: [name], MD;  Location: [facility];  Service: Endoscopy;  Laterality: N/A;   LAPAROSCOPIC SALPINGO OOPHERECTOMY Right 03/22/2017   Procedure: LAPAROSCOPIC SALPINGO OOPHORECTOMY;  Surgeon: Ward, Honor Loh, MD;  Location: [facility];  Service: Gynecology;  Laterality: Right;   TOTAL KNEE ARTHROPLASTY Right 10/29/2018   Procedure: TOTAL KNEE ARTHROPLASTY;  Surgeon: Carole Civil, MD;  Location: [facility];  Service: Orthopedics;  Laterality: Right;   TOTAL KNEE ARTHROPLASTY Left 05/27/2019   Procedure: TOTAL KNEE ARTHROPLASTY;  Surgeon: Carole Civil, MD;  Location: [facility];  Service: Orthopedics;  Laterality: Left;    Family History  Problem Relation Age of Onset   Breast cancer Mother 31   Diabetes Mother    Depression Mother    Anxiety disorder Mother    Obesity Mother    Breast cancer Paternal Grandmother     Allergies  Allergen Reactions   Nuvigil [Armodafinil] Hives   Penicillins Rash and Other (See Comments)    Has patient had a PCN reaction causing immediate rash, facial/tongue/throat swelling, SOB or lightheadedness with hypotension: Unknown Has patient had a PCN reaction causing severe rash involving mucus membranes or skin necrosis: Unknown Has patient had a PCN reaction that required hospitalization: Unknown Has patient had a PCN reaction  occurring within the last 10 years: Unknown If all of the above answers are "NO", then may proceed with Cephalosporin use.    Vancomycin Hives   Lyrica [Pregabalin] Hives   Bupropion Other (See Comments)    Causes migraines    Current Outpatient Medications on File Prior to Visit  Medication Sig Dispense Refill   Calcium Carb-Cholecalciferol (CALCIUM + D3 PO) Take 1 tablet by mouth at bedtime.     Cholecalciferol (VITAMIN D3) 5000 units CAPS Take 5,000 Units by mouth at bedtime.      clindamycin (CLEOCIN) 150 MG capsule 300 MG HALF HOUR BEFORE CLEANING 40 capsule 0   Desoximetasone 0.05 % GEL Apply 1 application topically daily. 60 g 1   diclofenac (CATAFLAM) 50 MG tablet Take 1 tablet (50 mg total)  by mouth 2 (two) times daily. 90 tablet 3   Efinaconazole (JUBLIA) 10 % SOLN Apply 1 application topically at bedtime. Apply to affected nails qhs 8 mL 11   fluconazole (DIFLUCAN) 150 MG tablet Take 150 mg by mouth once.     hydrochlorothiazide (HYDRODIURIL) 25 MG tablet TAKE 1 TABLET BY MOUTH DAILY FOR BLOOD PRESSURE. 90 tablet 1   loratadine (CLARITIN) 10 MG tablet Take 10 mg by mouth daily as needed for allergies.     Multiple Vitamins-Minerals (HAIR SKIN AND NAILS FORMULA PO) Take 1 tablet by mouth daily.     Multiple Vitamins-Minerals (MULTIVITAMIN WITH MINERALS) tablet Take 1 tablet by mouth daily.     pramipexole (MIRAPEX) 0.25 MG tablet TAKE 1 TABLET BY MOUTH DAILY 30 MINUTES TO 1 HOUR PRIOR TO BEDTIME 90 tablet 3   rizatriptan (MAXALT) 10 MG tablet TAKE 1 TABLET BY MOUTH AT MIGRAINE ONSET MAY REPEAT IN 2 HOURS IF HEADACHE PERSISTS.NOT EXCEED 2 TABLETS IN 24HRS 10 tablet 0   TARGADOX 50 MG TABS Take 1 tablet by mouth 2 (two) times daily.     valACYclovir (VALTREX) 1000 MG tablet Take 1,000 mg by mouth 2 (two) times daily as needed (fever blisters/cold sores.).      cyclobenzaprine (FLEXERIL) 10 MG tablet Take 0.5-1 tablets (5-10 mg total) by mouth at bedtime. (Patient  not taking: Reported on 09/03/2020) 30 tablet 1   No current facility-administered medications on file prior to visit.    BP 122/76    Pulse 72    Temp (!) 97.5 F (36.4 C) (Temporal)    Ht 5\' 3"  (1.6 m)    Wt 246 lb 6.4 oz (111.8 kg)    SpO2 97%    BMI 43.65 kg/m    Objective:   Physical Exam HENT:     Right Ear: Tympanic membrane and ear canal normal.     Left Ear: Tympanic membrane and ear canal normal.  Eyes:     Pupils: Pupils are equal, round, and reactive to light.  Cardiovascular:     Rate and Rhythm: Normal rate and regular rhythm.  Pulmonary:     Effort: Pulmonary effort is normal.     Breath sounds: Normal breath sounds.  Abdominal:     General: Bowel sounds are normal.     Palpations: Abdomen is soft.     Tenderness: There is no abdominal tenderness.  Musculoskeletal:        General: Normal range of motion.     Cervical back: Neck supple.  Skin:    General: Skin is warm and dry.  Neurological:     Mental Status: She is alert and oriented to person, place, and time.     Cranial Nerves: No cranial nerve deficit.     Deep Tendon Reflexes:     Reflex Scores:      Patellar reflexes are 2+ on the right side and 2+ on the left side. Psychiatric:        Mood and Affect: Mood normal.            Assessment & Plan:

## 2020-09-03 NOTE — Assessment & Plan Note (Signed)
Chronic for years, does well overall. Continue to monitor.

## 2020-09-03 NOTE — Assessment & Plan Note (Signed)
Doing well on Mirapex, continue same.

## 2020-09-03 NOTE — Patient Instructions (Signed)
Start exercising. You should be getting 150 minutes of moderate intensity exercise weekly.  It's important to improve your diet by reducing consumption of fast food, fried food, processed snack foods, sugary drinks. Increase consumption of fresh vegetables and fruits, whole grains, water.  Ensure you are drinking 64 ounces of water daily.  Set up a lab appointment for 6 months to repeat the A1C (diabetes test).  It was a pleasure to see you today!   Preventive Care 80-91 Years Old, Female Preventive care refers to visits with your health care provider and lifestyle choices that can promote health and wellness. This includes:  A yearly physical exam. This may also be called an annual well check.  Regular dental visits and eye exams.  Immunizations.  Screening for certain conditions.  Healthy lifestyle choices, such as eating a healthy diet, getting regular exercise, not using drugs or products that contain nicotine and tobacco, and limiting alcohol use. What can I expect for my preventive care visit? Physical exam Your health care provider will check your:  Height and weight. This may be used to calculate body mass index (BMI), which tells if you are at a healthy weight.  Heart rate and blood pressure.  Skin for abnormal spots. Counseling Your health care provider may ask you questions about your:  Alcohol, tobacco, and drug use.  Emotional well-being.  Home and relationship well-being.  Sexual activity.  Eating habits.  Work and work Statistician.  Method of birth control.  Menstrual cycle.  Pregnancy history. What immunizations do I need?  Influenza (flu) vaccine  This is recommended every year. Tetanus, diphtheria, and pertussis (Tdap) vaccine  You may need a Td booster every 10 years. Varicella (chickenpox) vaccine  You may need this if you have not been vaccinated. Zoster (shingles) vaccine  You may need this after age 53. Measles, mumps, and  rubella (MMR) vaccine  You may need at least one dose of MMR if you were born in 1957 or later. You may also need a second dose. Pneumococcal conjugate (PCV13) vaccine  You may need this if you have certain conditions and were not previously vaccinated. Pneumococcal polysaccharide (PPSV23) vaccine  You may need one or two doses if you smoke cigarettes or if you have certain conditions. Meningococcal conjugate (MenACWY) vaccine  You may need this if you have certain conditions. Hepatitis A vaccine  You may need this if you have certain conditions or if you travel or work in places where you may be exposed to hepatitis A. Hepatitis B vaccine  You may need this if you have certain conditions or if you travel or work in places where you may be exposed to hepatitis B. Haemophilus influenzae type b (Hib) vaccine  You may need this if you have certain conditions. Human papillomavirus (HPV) vaccine  If recommended by your health care provider, you may need three doses over 6 months. You may receive vaccines as individual doses or as more than one vaccine together in one shot (combination vaccines). Talk with your health care provider about the risks and benefits of combination vaccines. What tests do I need? Blood tests  Lipid and cholesterol levels. These may be checked every 5 years, or more frequently if you are over 56 years old.  Hepatitis C test.  Hepatitis B test. Screening  Lung cancer screening. You may have this screening every year starting at age 61 if you have a 30-pack-year history of smoking and currently smoke or have quit within the past  15 years.  Colorectal cancer screening. All adults should have this screening starting at age 81 and continuing until age 42. Your health care provider may recommend screening at age 43 if you are at increased risk. You will have tests every 1-10 years, depending on your results and the type of screening test.  Diabetes screening. This  is done by checking your blood sugar (glucose) after you have not eaten for a while (fasting). You may have this done every 1-3 years.  Mammogram. This may be done every 1-2 years. Talk with your health care provider about when you should start having regular mammograms. This may depend on whether you have a family history of breast cancer.  BRCA-related cancer screening. This may be done if you have a family history of breast, ovarian, tubal, or peritoneal cancers.  Pelvic exam and Pap test. This may be done every 3 years starting at age 90. Starting at age 65, this may be done every 5 years if you have a Pap test in combination with an HPV test. Other tests  Sexually transmitted disease (STD) testing.  Bone density scan. This is done to screen for osteoporosis. You may have this scan if you are at high risk for osteoporosis. Follow these instructions at home: Eating and drinking  Eat a diet that includes fresh fruits and vegetables, whole grains, lean protein, and low-fat dairy.  Take vitamin and mineral supplements as recommended by your health care provider.  Do not drink alcohol if: ? Your health care provider tells you not to drink. ? You are pregnant, may be pregnant, or are planning to become pregnant.  If you drink alcohol: ? Limit how much you have to 0-1 drink a day. ? Be aware of how much alcohol is in your drink. In the [place] one drink equals one 12 oz bottle of beer (355 mL), one 5 oz glass of wine (148 mL), or one 1 oz glass of hard liquor (44 mL). Lifestyle  Take daily care of your teeth and gums.  Stay active. Exercise for at least 30 minutes on 5 or more days each week.  Do not use any products that contain nicotine or tobacco, such as cigarettes, e-cigarettes, and chewing tobacco. If you need help quitting, ask your health care provider.  If you are sexually active, practice safe sex. Use a condom or other form of birth control (contraception) in order to prevent  pregnancy and STIs (sexually transmitted infections).  If told by your health care provider, take low-dose aspirin daily starting at age 24. What's next?  Visit your health care provider once a year for a well check visit.  Ask your health care provider how often you should have your eyes and teeth checked.  Stay up to date on all vaccines. This information is not intended to replace advice given to you by your health care provider. Make sure you discuss any questions you have with your health care provider. Document Revised: 06/20/2018 Document Reviewed: 06/20/2018 Elsevier Patient Education  2020 [employer].

## 2020-09-03 NOTE — Assessment & Plan Note (Addendum)
Overall doing well since knee replacement.  Follows with orthopedics.  Infrequent use of cyclobenzaprine. Using diclofenac moreso for arthralgias to the hands.

## 2020-09-03 NOTE — Assessment & Plan Note (Signed)
Immunizations UTD. Mammogram UTD. Colonoscopy UTD, due in 2028. Discussed the importance of a healthy diet and regular exercise in order for weight loss, and to reduce the risk of any potential medical problems.  Exam today unremarkable. Labs reviewed.

## 2020-09-03 NOTE — Assessment & Plan Note (Signed)
Recent CBC unremarkable. No vaginal or rectal bleeding. Continue to monitor.

## 2020-09-03 NOTE — Assessment & Plan Note (Signed)
Well controlled on recent labs. Continue supplemental vitamin D3.

## 2020-09-03 NOTE — Assessment & Plan Note (Signed)
Mild depression per patient, denies concerns today. Discussed to please update me if symptoms increase or become unmanageable.

## 2020-09-03 NOTE — Assessment & Plan Note (Signed)
Well controlled from recent labs. Continue to monitor.

## 2020-09-07 ENCOUNTER — Ambulatory Visit: Payer: 59 | Admitting: Orthopaedic Surgery

## 2020-09-10 IMAGING — CR DG KNEE 1-2V PORT*R*
2 series · 2 of 2 positions shown · non-contrast
Comparison: Radiographs September 06, 2018.

CLINICAL DATA: Status post right knee replacement.

EXAM:
PORTABLE RIGHT KNEE - 1-2 VIEW

[ap]
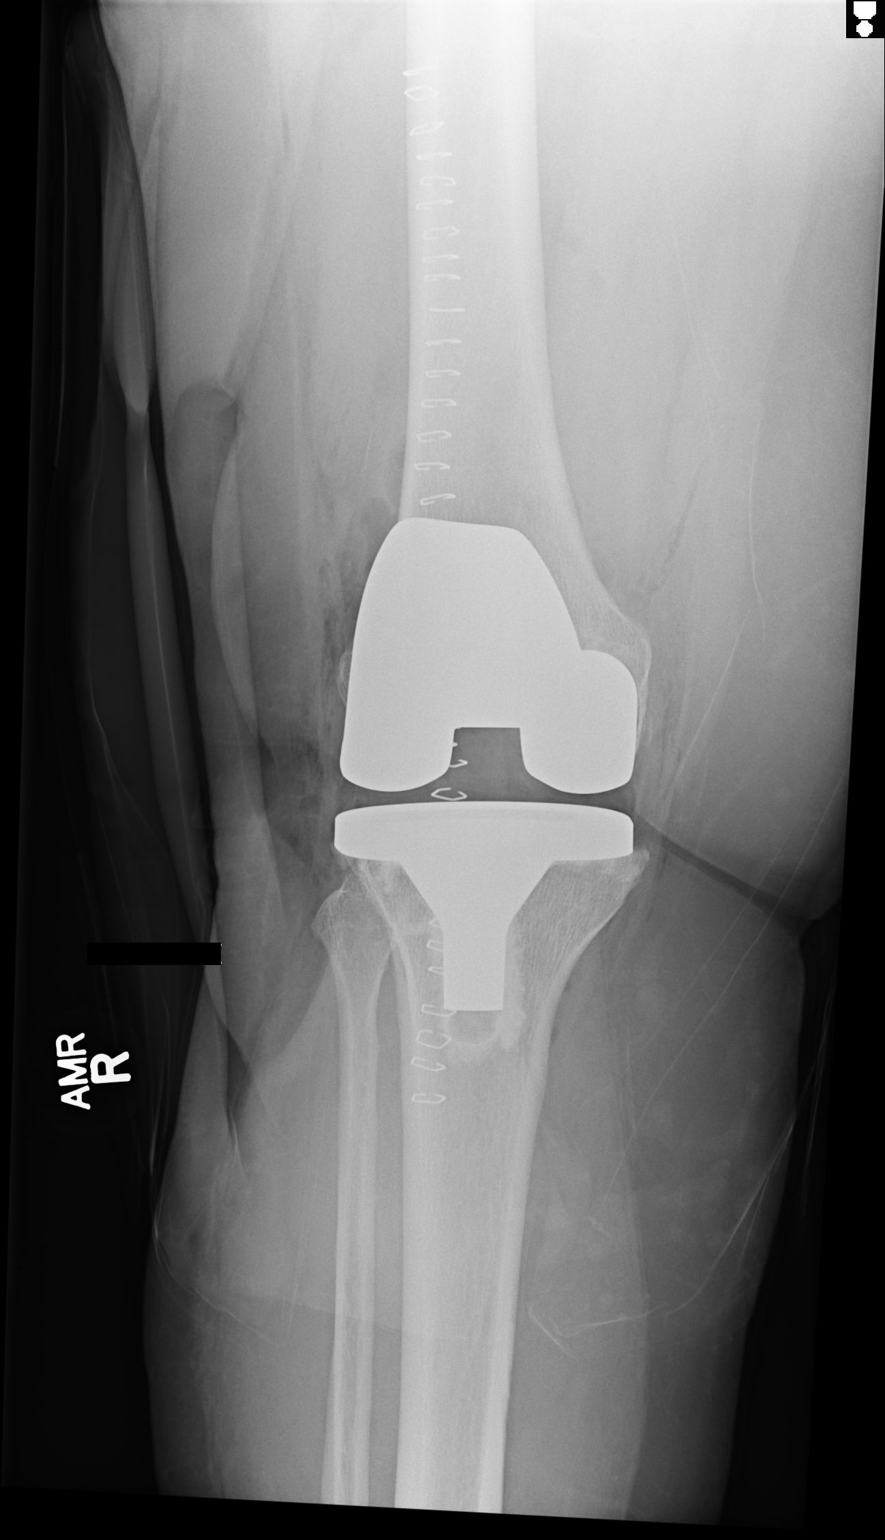

[lat]
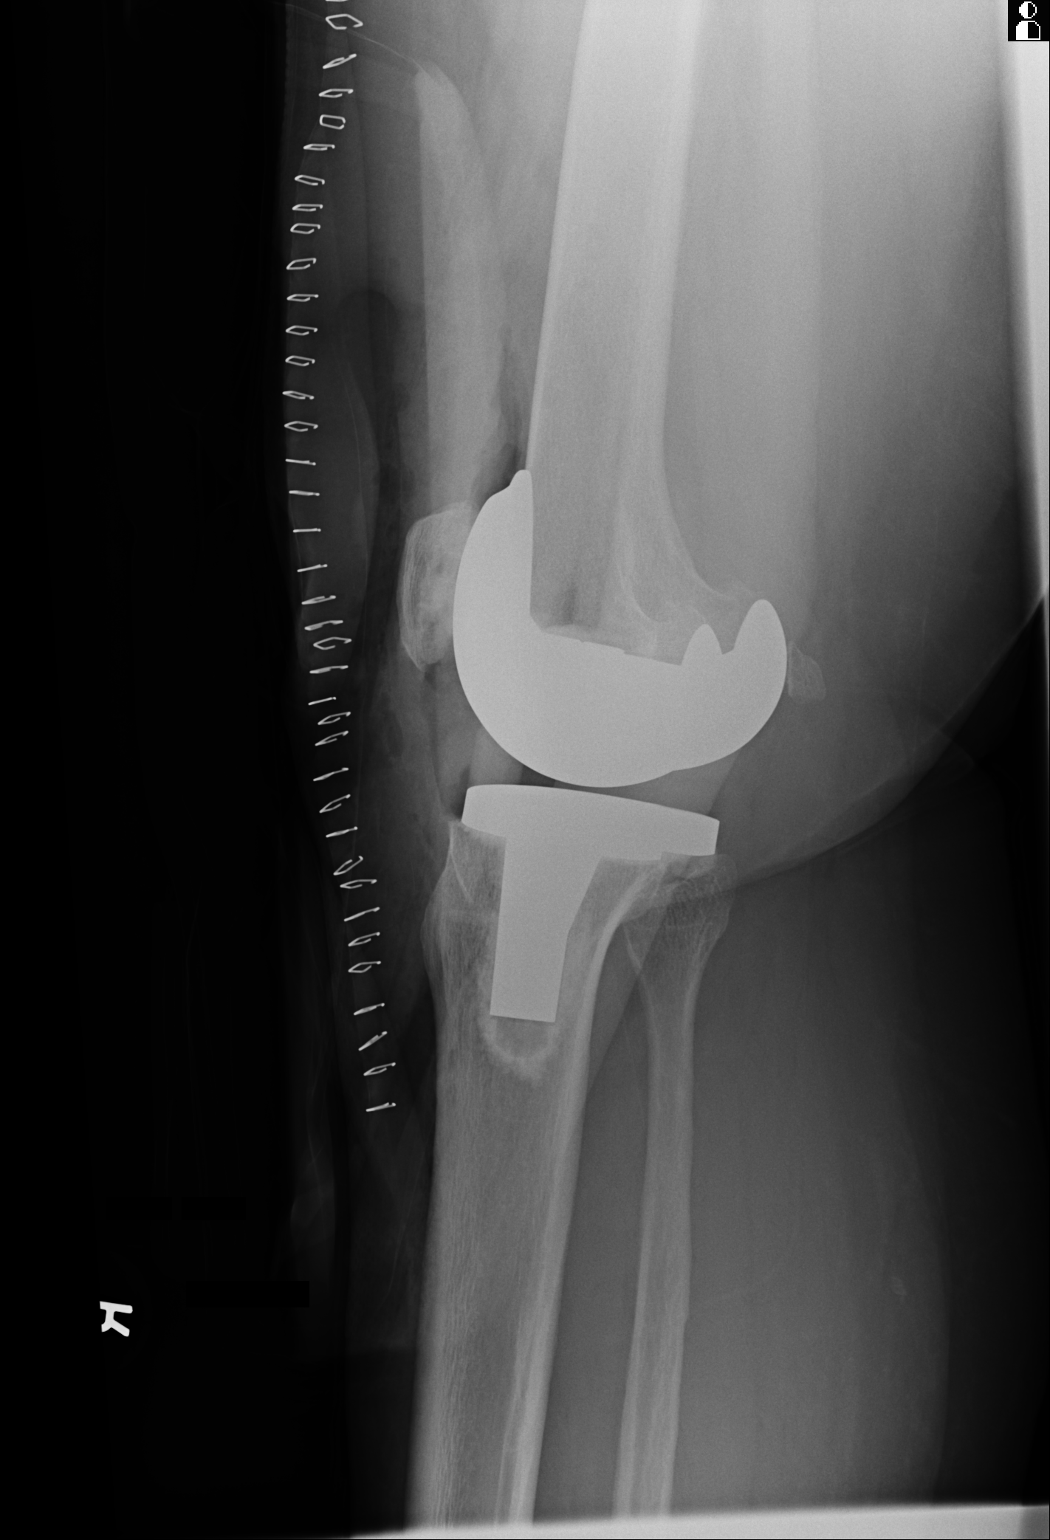

[2 of 2 positions shown; findings below may reference images not displayed]

FINDINGS: The femoral and tibial components appear to be well situated.
Expected postoperative changes are noted anteriorly. No fracture or
dislocation is noted.
IMPRESSION: Status post right total knee arthroplasty.

## 2020-09-14 ENCOUNTER — Other Ambulatory Visit: Payer: Self-pay

## 2020-09-14 MED ORDER — AZITHROMYCIN 250 MG PO TABS: ORAL_TABLET | ORAL | 0 refills | Status: DC

## 2020-09-18 ENCOUNTER — Encounter: Payer: Self-pay | Admitting: Physician Assistant

## 2020-09-18 ENCOUNTER — Ambulatory Visit (HOSPITAL_COMMUNITY)
Admission: RE | Admit: 2020-09-18 | Discharge: 2020-09-18 | Disposition: A | Discharge status: Home / Self Care | Payer: 59 | Source: Ambulatory Visit | Attending: Pulmonary Disease | Admitting: Pulmonary Disease

## 2020-09-18 ENCOUNTER — Other Ambulatory Visit (HOSPITAL_COMMUNITY): Payer: Self-pay | Admitting: Physician Assistant

## 2020-09-18 DIAGNOSIS — U071 COVID-19: Secondary | ICD-10-CM | POA: Insufficient documentation

## 2020-09-18 MED ORDER — ALBUTEROL SULFATE HFA 108 (90 BASE) MCG/ACT IN AERS: 2.0000 | INHALATION_SPRAY | Freq: Once | RESPIRATORY_TRACT | Status: DC | PRN

## 2020-09-18 MED ORDER — SODIUM CHLORIDE 0.9 % IV SOLN: INTRAVENOUS | Status: DC | PRN

## 2020-09-18 MED ORDER — DIPHENHYDRAMINE HCL 50 MG/ML IJ SOLN: 50.0000 mg | Freq: Once | INTRAMUSCULAR | Status: DC | PRN

## 2020-09-18 MED ORDER — FAMOTIDINE IN NACL 20-0.9 MG/50ML-% IV SOLN: 20.0000 mg | Freq: Once | INTRAVENOUS | Status: DC | PRN

## 2020-09-18 MED ORDER — SOTROVIMAB 500 MG/8ML IV SOLN
500.0000 mg | Freq: Once | INTRAVENOUS | Status: AC
  Administered 2020-09-18: 500 mg via INTRAVENOUS

## 2020-09-18 MED ORDER — EPINEPHRINE 0.3 MG/0.3ML IJ SOAJ: 0.3000 mg | Freq: Once | INTRAMUSCULAR | Status: DC | PRN

## 2020-09-18 MED ORDER — METHYLPREDNISOLONE SODIUM SUCC 125 MG IJ SOLR: 125.0000 mg | Freq: Once | INTRAMUSCULAR | Status: DC | PRN

## 2020-09-18 NOTE — Progress Notes (Signed)
Patient reviewed Fact Sheet for Patients, Parents, and Caregivers for Emergency Use Authorization (EUA) of Sotrovimab for the Treatment of Coronavirus. Patient also reviewed and is agreeable to the estimated cost of treatment. Patient is agreeable to proceed.   

## 2020-09-18 NOTE — Progress Notes (Signed)
Diagnosis: COVID-19  Physician: Dr. Patrick Wright  Procedure: [facility] Infusion Clinic Med: Sotrovimab infusion - Provided patient with sotrovimab fact sheet for patients, parents, and caregivers prior to infusion.   Complications: No immediate complications noted  Discharge: Discharged home    

## 2020-09-18 NOTE — Discharge Instructions (Signed)
10 Things You Can Do to Manage Your COVID-19 Symptoms at Home If you have possible or confirmed COVID-19: 1. Stay home from work and school. And stay away from other public places. If you must go out, avoid using any kind of public transportation, ridesharing, or taxis. 2. Monitor your symptoms carefully. If your symptoms get worse, call your healthcare provider immediately. 3. Get rest and stay hydrated. 4. If you have a medical appointment, call the healthcare provider ahead of time and tell them that you have or may have COVID-19. 5. For medical emergencies, call 911 and notify the dispatch personnel that you have or may have COVID-19. 6. Cover your cough and sneezes with a tissue or use the inside of your elbow. 7. Wash your hands often with soap and water for at least 20 seconds or clean your hands with an alcohol-based hand sanitizer that contains at least 60% alcohol. 8. As much as possible, stay in a specific room and away from other people in your home. Also, you should use a separate bathroom, if available. If you need to be around other people in or outside of the home, wear a mask. 9. Avoid sharing personal items with other people in your household, like dishes, towels, and bedding. 10. Clean all surfaces that are touched often, like counters, tabletops, and doorknobs. Use household cleaning sprays or wipes according to the label instructions. [email or web address] 04/23/2019  What types of side effects do monoclonal antibody drugs cause?  Common side effects  In general, the more common side effects caused by monoclonal antibody drugs include: . Allergic reactions, such as hives or itching . Flu-like signs and symptoms, including chills, fatigue, fever, and muscle aches and pains . Nausea, vomiting . Diarrhea . Skin rashes . Low blood pressure   The CDC is recommending patients who receive monoclonal antibody treatments wait at least 90 days before being  vaccinated.  Currently, there are no data on the safety and efficacy of mRNA COVID-19 vaccines in persons who received monoclonal antibodies or convalescent plasma as part of COVID-19 treatment. Based on the estimated half-life of such therapies as well as evidence suggesting that reinfection is uncommon in the 90 days after initial infection, vaccination should be deferred for at least 90 days, as a precautionary measure until additional information becomes available, to avoid interference of the antibody treatment with vaccine-induced immune responses.  If you have any questions or concerns after the infusion please call the Advanced Practice Provider on call at [phone]

## 2020-09-18 NOTE — Progress Notes (Unsigned)
I connected by phone with [name] on 09/18/2020 at 10:26 AM to discuss the potential use of a new treatment for mild to moderate COVID-19 viral infection in non-hospitalized patients.  This patient is a 54 y.o. female that meets the FDA criteria for Emergency Use Authorization of COVID monoclonal antibody casirivimab/imdevimab, bamlanivimab/eteseviamb, or sotrovimab.  Has a (+) direct SARS-CoV-2 viral test result  Has mild or moderate COVID-19   Is NOT hospitalized due to COVID-19  Is within 10 days of symptom onset  Has at least one of the high risk factor(s) for progression to severe COVID-19 and/or hospitalization as defined in EUA.  Specific high risk criteria : BMI > 25 and Cardiovascular disease or hypertension   I have spoken and communicated the following to the patient or parent/caregiver regarding COVID monoclonal antibody treatment:  1. FDA has authorized the emergency use for the treatment of mild to moderate COVID-19 in adults and pediatric patients with positive results of direct SARS-CoV-2 viral testing who are 70 years of age and older weighing at least 40 kg, and who are at high risk for progressing to severe COVID-19 and/or hospitalization.  2. The significant known and potential risks and benefits of COVID monoclonal antibody, and the extent to which such potential risks and benefits are unknown.  3. Information on available alternative treatments and the risks and benefits of those alternatives, including clinical trials.  4. Patients treated with COVID monoclonal antibody should continue to self-isolate and use infection control measures (e.g., wear mask, isolate, social distance, avoid sharing personal items, clean and disinfect "high touch" surfaces, and frequent handwashing) according to CDC guidelines.   5. The patient or parent/caregiver has the option to accept or refuse COVID monoclonal antibody treatment.  After reviewing this information with the  patient, the patient has agreed to receive one of the available covid 19 monoclonal antibodies and will be provided an appropriate fact sheet prior to infusion. Konrad Felix, PA-C 09/18/2020 10:26 AM

## 2020-09-20 ENCOUNTER — Other Ambulatory Visit: Payer: Self-pay | Admitting: Dermatology

## 2020-09-20 ENCOUNTER — Other Ambulatory Visit: Payer: Self-pay

## 2020-09-20 MED ORDER — HYDROCOD POLST-CPM POLST ER 10-8 MG/5ML PO SUER: 5.0000 mL | Freq: Two times a day (BID) | ORAL | 0 refills | Status: DC | PRN

## 2020-09-20 NOTE — Progress Notes (Unsigned)
tussionex

## 2020-10-21 ENCOUNTER — Ambulatory Visit: Payer: 59

## 2020-10-21 ENCOUNTER — Other Ambulatory Visit: Payer: Self-pay

## 2020-10-21 ENCOUNTER — Ambulatory Visit (INDEPENDENT_AMBULATORY_CARE_PROVIDER_SITE_OTHER): Payer: 59 | Admitting: Orthopedic Surgery

## 2020-10-21 ENCOUNTER — Encounter: Payer: Self-pay | Admitting: Orthopedic Surgery

## 2020-10-21 VITALS — BP 166/84 | HR 66 | Ht 63.0 in | Wt 246.0 lb

## 2020-10-21 DIAGNOSIS — M1710 Unilateral primary osteoarthritis, unspecified knee: Secondary | ICD-10-CM

## 2020-10-21 DIAGNOSIS — Z96652 Presence of left artificial knee joint: Secondary | ICD-10-CM

## 2020-10-21 DIAGNOSIS — Z96651 Presence of right artificial knee joint: Secondary | ICD-10-CM | POA: Diagnosis not present

## 2020-10-21 NOTE — Progress Notes (Signed)
ANNUAL FOLLOW UP FOR status post bilateral TKA   Chief Complaint  Patient presents with  . Post-op Follow-up    10/29/18 right knee replacement  05/27/19 left knee replacement      HPI: The patient is here for the annual  follow-up x-ray for knee replacement. The patient is not complaining of pain weakness instability or stiffness in the repaired knee.  She does have some numbness on the lateral side of both legs extending into the lateral or anterolateral compartment  ROS  Negative knee functioning well standing bending working chairs and stairs   Examination of the right KNEE  BP (!) 166/84   Pulse 66   Ht 5\' 3"  (1.6 m)   Wt 246 lb (111.6 kg)   BMI 43.58 kg/m   General the patient is normally groomed in no distress  Inspection shows : incision healed nicely without erythema, no tenderness no swelling  Range of motion total range of motion is 110  Stability the knee is stable anterior to posterior as well as medial to lateral  Strength quadriceps strength is normal  Skin no erythema around the skin incision  Neuro: normal sensation in the operative leg   Gait: normal expected gait without cane   Examination of left knee  Range of motion total range of motion is 110  Stability the knee is stable anterior to posterior as well as medial to lateral  Strength quadriceps strength is normal  Skin no erythema around the skin incision  Neuro: normal sensation in the operative leg    Medical decision-making section  X-rays ordered, internal imaging shows (see full dictated report) stable implant with no signs of loosening both knees tilt noted left patella not symptomatic  Diagnosis  Encounter Diagnoses  Name Primary?  . S/P total knee replacement, right 10/29/18 Yes  . S/P TKR (total knee replacement), left 05/27/2019   . Arthritis of knee      Plan follow-up 2 year repeat x-rays

## 2020-10-29 ENCOUNTER — Ambulatory Visit: Payer: 59 | Admitting: Orthopedic Surgery

## 2020-11-15 DIAGNOSIS — Z20828 Contact with and (suspected) exposure to other viral communicable diseases: Secondary | ICD-10-CM | POA: Diagnosis not present

## 2020-12-17 ENCOUNTER — Other Ambulatory Visit: Payer: Self-pay | Admitting: Dermatology

## 2020-12-23 ENCOUNTER — Other Ambulatory Visit: Payer: Self-pay

## 2020-12-23 MED ORDER — SCOPOLAMINE 1 MG/3DAYS TD PT72: 1.0000 | MEDICATED_PATCH | TRANSDERMAL | 0 refills | Status: DC

## 2020-12-31 ENCOUNTER — Other Ambulatory Visit: Payer: Self-pay | Admitting: Primary Care

## 2020-12-31 ENCOUNTER — Other Ambulatory Visit: Payer: Self-pay | Admitting: Internal Medicine

## 2020-12-31 ENCOUNTER — Other Ambulatory Visit: Payer: Self-pay | Admitting: Orthopedic Surgery

## 2020-12-31 DIAGNOSIS — I10 Essential (primary) hypertension: Secondary | ICD-10-CM

## 2020-12-31 DIAGNOSIS — M19049 Primary osteoarthritis, unspecified hand: Secondary | ICD-10-CM

## 2020-12-31 NOTE — Telephone Encounter (Signed)
Rx request 

## 2021-01-03 ENCOUNTER — Other Ambulatory Visit: Payer: Self-pay

## 2021-01-03 MED ORDER — VALACYCLOVIR HCL 1 G PO TABS
2000.0000 mg | ORAL_TABLET | ORAL | 11 refills | Status: DC
Start: 2021-01-03 — End: 2021-01-03

## 2021-01-03 NOTE — Progress Notes (Signed)
Valtrex RF 

## 2021-01-05 ENCOUNTER — Other Ambulatory Visit: Payer: Self-pay

## 2021-01-05 MED ORDER — TRIAMCINOLONE ACETONIDE 0.1 % EX CREA: 1.0000 "application " | TOPICAL_CREAM | Freq: Two times a day (BID) | CUTANEOUS | 1 refills | Status: DC | PRN

## 2021-02-13 DIAGNOSIS — D239 Other benign neoplasm of skin, unspecified: Secondary | ICD-10-CM

## 2021-02-13 HISTORY — DX: Other benign neoplasm of skin, unspecified: D23.9

## 2021-02-14 ENCOUNTER — Ambulatory Visit (INDEPENDENT_AMBULATORY_CARE_PROVIDER_SITE_OTHER): Payer: 59 | Admitting: Dermatology

## 2021-02-14 ENCOUNTER — Other Ambulatory Visit: Payer: Self-pay

## 2021-02-14 DIAGNOSIS — D492 Neoplasm of unspecified behavior of bone, soft tissue, and skin: Secondary | ICD-10-CM

## 2021-02-14 DIAGNOSIS — D225 Melanocytic nevi of trunk: Secondary | ICD-10-CM | POA: Diagnosis not present

## 2021-02-14 NOTE — Patient Instructions (Addendum)
If you have any questions or concerns for your doctor, please call our main line at [phone] and press option 4 to reach your doctor's medical assistant. If no one answers, please leave a voicemail as directed and we will return your call as soon as possible. Messages left after 4 pm will be answered the following business day.   You may also send us a message via MyChart. We typically respond to MyChart messages within 1-2 business days.  For prescription refills, please ask your pharmacy to contact our office. Our fax number is [phone].  If you have an urgent issue when the clinic is closed that cannot wait until the next business day, you can page your doctor at the number below.    Please note that while we do our best to be available for urgent issues outside of office hours, we are not available 24/7.   If you have an urgent issue and are unable to reach us, you may choose to seek medical care at your doctor's office, retail clinic, urgent care center, or emergency room.  If you have a medical emergency, please immediately call 911 or go to the emergency department.  Pager Numbers  - Dr. Kowalski: [phone]  - Dr. Moye: [phone]  - Dr. Stewart: [phone]  In the event of inclement weather, please call our main line at [phone] for an update on the status of any delays or closures.  Dermatology Medication Tips: Please keep the boxes that topical medications come in in order to help keep track of the instructions about where and how to use these. Pharmacies typically print the medication instructions only on the boxes and not directly on the medication tubes.   If your medication is too expensive, please contact our office at [phone] option 4 or send us a message through MyChart.   We are unable to tell what your co-pay for medications will be in advance as this is different depending on your insurance coverage. However, we may be able to find a  substitute medication at lower cost or fill out paperwork to get insurance to cover a needed medication.   If a prior authorization is required to get your medication covered by your insurance company, please allow us 1-2 business days to complete this process.  Drug prices often vary depending on where the prescription is filled and some pharmacies may offer cheaper prices.  The website www.goodrx.com contains coupons for medications through different pharmacies. The prices here do not account for what the cost may be with help from insurance (it may be cheaper with your insurance), but the website can give you the price if you did not use any insurance.  - You can print the associated coupon and take it with your prescription to the pharmacy.  - You may also stop by our office during regular business hours and pick up a GoodRx coupon card.  - If you need your prescription sent electronically to a different pharmacy, notify our office through [facility] MyChart or by phone at [phone] option 4.     Wound Care Instructions  1. Cleanse wound gently with soap and water once a day then pat dry with clean gauze. Apply a thing coat of Petrolatum (petroleum jelly, "Vaseline") over the wound (unless you have an allergy to this). We recommend that you use a new, sterile tube of Vaseline. Do not pick or remove scabs. Do not remove the yellow or white "healing tissue" from the base of the wound.    2. Cover the wound with fresh, clean, nonstick gauze and secure with paper tape. You may use Band-Aids in place of gauze and tape if the would is small enough, but would recommend trimming much of the tape off as there is often too much. Sometimes Band-Aids can irritate the skin.  3. You should call the office for your biopsy report after 1 week if you have not already been contacted.  4. If you experience any problems, such as abnormal amounts of bleeding, swelling, significant bruising, significant pain,  or evidence of infection, please call the office immediately.  5. FOR ADULT SURGERY PATIENTS: If you need something for pain relief you may take 1 extra strength Tylenol (acetaminophen) AND 2 Ibuprofen (200mg each) together every 4 hours as needed for pain. (do not take these if you are allergic to them or if you have a reason you should not take them.) Typically, you may only need pain medication for 1 to 3 days.     

## 2021-02-14 NOTE — Progress Notes (Signed)
   Follow-Up Visit   Subjective  Amy Mooney is a 55 y.o. female who presents for the following: Nevus (R upper back, just noticed).  The following portions of the chart were reviewed this encounter and updated as appropriate:   Tobacco  Allergies  Meds  Problems  Med Hx  Surg Hx  Fam Hx     Review of Systems:  No other skin or systemic complaints except as noted in HPI or Assessment and Plan.  Objective  Well appearing patient in no apparent distress; mood and affect are within normal limits.  A focused examination was performed including back. Relevant physical exam findings are noted in the Assessment and Plan.  Objective  R upper back paraspinal, 4.0cm lat to spine: Irregular brown macule 0.6cm   Assessment & Plan  Neoplasm of skin R upper back paraspinal, 4.0cm lat to spine  Epidermal / dermal shaving  Lesion diameter (cm):  0.6 Informed consent: discussed and consent obtained   Timeout: patient name, date of birth, surgical site, and procedure verified   Procedure prep:  Patient was prepped and draped in usual sterile fashion Prep type:  Isopropyl alcohol Anesthesia: the lesion was anesthetized in a standard fashion   Anesthetic:  1% lidocaine w/ epinephrine 1-100,000 buffered w/ 8.4% NaHCO3 Instrument used: flexible razor blade   Hemostasis achieved with: pressure, aluminum chloride and electrodesiccation   Outcome: patient tolerated procedure well   Post-procedure details: sterile dressing applied and wound care instructions given   Dressing type: bandage and petrolatum    Specimen 1 - Surgical pathology Differential Diagnosis: D48.5 Nevus vs Dysplastic nevus  Check Margins: yes Irregular brown macule 0.6cm  Return if symptoms worsen or fail to improve.  I, Othelia Pulling, RMA, am acting as scribe for Sarina Ser, MD .  Documentation: I have reviewed the above documentation for accuracy and completeness, and I agree with the above.  Sarina Ser, MD

## 2021-02-15 ENCOUNTER — Other Ambulatory Visit: Payer: Self-pay | Admitting: Primary Care

## 2021-02-15 ENCOUNTER — Encounter: Payer: Self-pay | Admitting: Dermatology

## 2021-02-15 DIAGNOSIS — E8881 Metabolic syndrome: Secondary | ICD-10-CM

## 2021-03-04 ENCOUNTER — Other Ambulatory Visit: Payer: Self-pay

## 2021-03-04 ENCOUNTER — Other Ambulatory Visit (INDEPENDENT_AMBULATORY_CARE_PROVIDER_SITE_OTHER): Payer: 59

## 2021-03-04 DIAGNOSIS — E8881 Metabolic syndrome: Secondary | ICD-10-CM | POA: Diagnosis not present

## 2021-03-04 LAB — POCT GLYCOSYLATED HEMOGLOBIN (HGB A1C): Hemoglobin A1C: 5.8 % — AB (ref 4.0–5.6)

## 2021-04-08 IMAGING — CR PORTABLE LEFT KNEE - 1-2 VIEW
1 series · 2 of 2 positions shown · non-contrast
Comparison: Radiographs 09/06/2018

CLINICAL DATA: Postop left total knee arthroplasty.

EXAM:
PORTABLE LEFT KNEE - 1-2 VIEW

[Series 1: ap · 0.17mm/px · 2 of 2 slices shown]
[im 1/2]
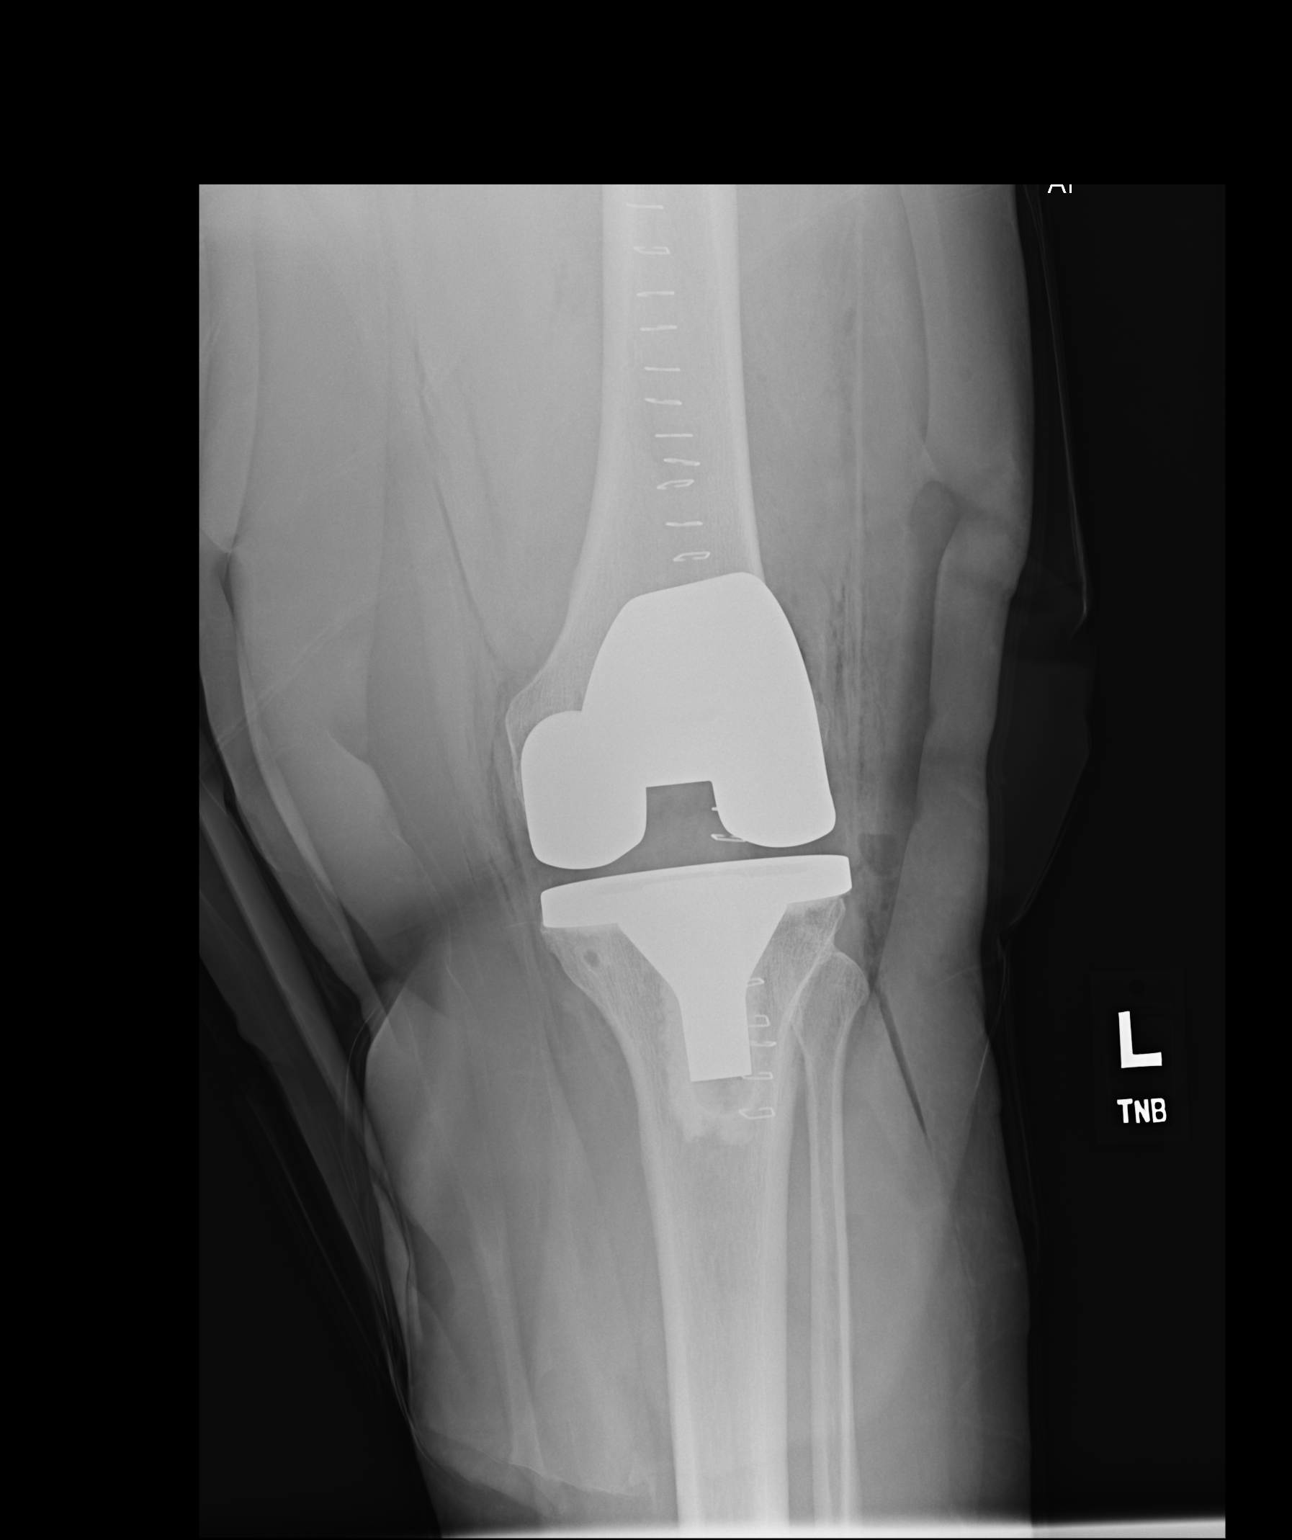
[im 2/2]
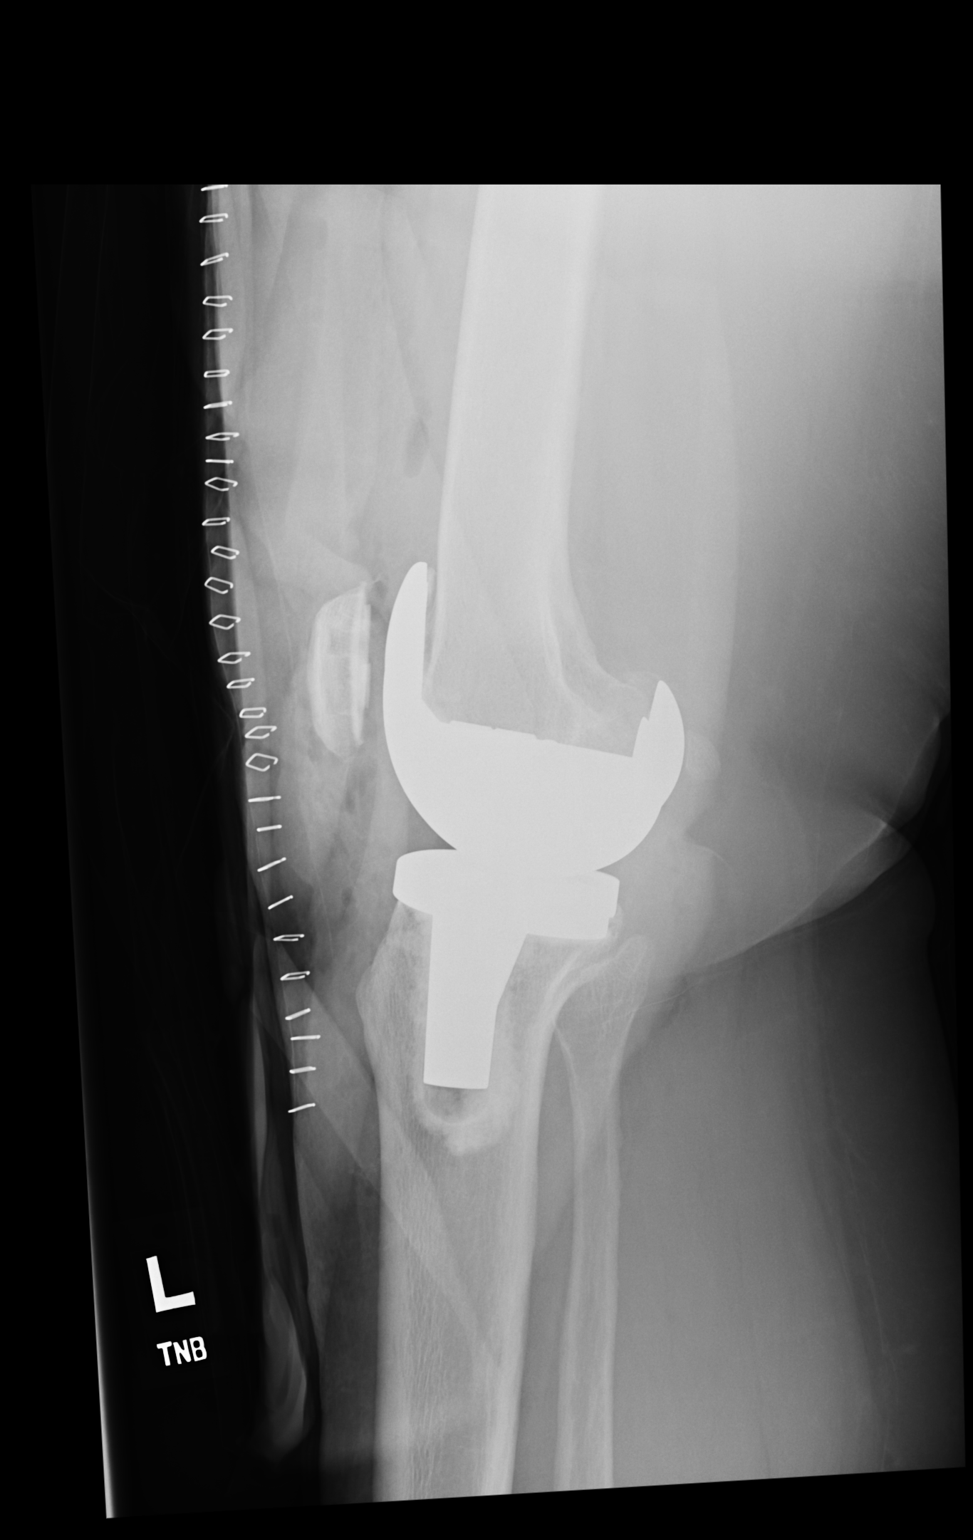

[2 of 2 positions shown; findings below may reference images not displayed]

FINDINGS: Well seated tibial and femoral components. No complicating features
are identified.
IMPRESSION: Well seated components of a total knee arthroplasty without
complicating features.

## 2021-04-15 ENCOUNTER — Other Ambulatory Visit: Payer: Self-pay

## 2021-04-15 MED FILL — Pramipexole Dihydrochloride Tab 0.25 MG: ORAL | 90 days supply | Qty: 90 | Fill #0 | Status: AC

## 2021-04-15 MED FILL — Hydrochlorothiazide Tab 25 MG: ORAL | 90 days supply | Qty: 90 | Fill #0 | Status: AC

## 2021-04-15 MED FILL — Diclofenac Potassium Tab 50 MG: ORAL | 45 days supply | Qty: 90 | Fill #0 | Status: AC

## 2021-06-17 ENCOUNTER — Other Ambulatory Visit: Payer: Self-pay

## 2021-06-17 ENCOUNTER — Encounter: Payer: Self-pay | Admitting: Primary Care

## 2021-06-17 ENCOUNTER — Ambulatory Visit (INDEPENDENT_AMBULATORY_CARE_PROVIDER_SITE_OTHER): Payer: 59 | Admitting: Primary Care

## 2021-06-17 VITALS — BP 146/82 | HR 78 | Temp 98.3°F | Ht 63.0 in | Wt 260.0 lb

## 2021-06-17 DIAGNOSIS — I10 Essential (primary) hypertension: Secondary | ICD-10-CM

## 2021-06-17 DIAGNOSIS — Z23 Encounter for immunization: Secondary | ICD-10-CM | POA: Diagnosis not present

## 2021-06-17 NOTE — Patient Instructions (Addendum)
Monitor your blood pressure at work, you should be running less than 130 on top and less than 90 on bottom.   It was a pleasure to see you today!    Influenza (Flu) Vaccine (Inactivated or Recombinant): What You Need to Know 1. Why get vaccinated? Influenza vaccine can prevent influenza (flu). Flu is a contagious disease that spreads around the [place] every year, usually between October and May. Anyone can get the flu, but it is more dangerous for some people. Infants and young children, people 55 years and older, pregnant people, and people with certain health conditions or a weakenedimmune system are at greatest risk of flu complications. Pneumonia, bronchitis, sinus infections, and ear infections are examples of flu-related complications. If you have a medical condition, such as heartdisease, cancer, or diabetes, flu can make it worse. Flu can cause fever and chills, sore throat, muscle aches, fatigue, cough, headache, and runny or stuffy nose. Some people may have vomiting and diarrhea,though this is more common in children than adults. In an average year, thousands of people in the [place] die from flu, and many more are hospitalized. Flu vaccine prevents millions of illnessesand flu-related visits to the doctor each year. 2. Influenza vaccines CDC recommends everyone 6 months and older get vaccinated every flu season. Children 6 months through 2 years of age may need 2 doses during a single flu season. Everyone else needs only 1 dose each flu season. It takes about 2 weeks for protection to develop after vaccination. There are many flu viruses, and they are always changing. Each year a new flu vaccine is made to protect against the influenza viruses believed to be likely to cause disease in the upcoming flu season. Even when the vaccine doesn'texactly match these viruses, it may still provide some protection. Influenza vaccine does not cause flu. Influenza vaccine may be given at  the same time as other vaccines. 3. Talk with your health care provider Tell your vaccination provider if the person getting the vaccine: Has had an allergic reaction after a previous dose of influenza vaccine, or has any severe, life-threatening allergies Has ever had Guillain-Barr Syndrome (also called "GBS") In some cases, your health care provider may decide to postpone influenzavaccination until a future visit. Influenza vaccine can be administered at any time during pregnancy. People who are or will be pregnant during influenza season should receive inactivatedinfluenza vaccine. People with minor illnesses, such as a cold, may be vaccinated. People who are moderately or severely ill should usually wait until they recover beforegetting influenza vaccine. Your health care provider can give you more information. 4. Risks of a vaccine reaction Soreness, redness, and swelling where the shot is given, fever, muscle aches, and headache can happen after influenza vaccination. There may be a very small increased risk of Guillain-Barr Syndrome (GBS) after inactivated influenza vaccine (the flu shot). Young children who get the flu shot along with pneumococcal vaccine (PCV13) and/or DTaP vaccine at the same time might be slightly more likely to have a seizure caused by fever. Tell your health care provider if a child who isgetting flu vaccine has ever had a seizure. People sometimes faint after medical procedures, including vaccination. Tellyour provider if you feel dizzy or have vision changes or ringing in the ears. As with any medicine, there is a very remote chance of a vaccine causing asevere allergic reaction, other serious injury, or death. 5. What if there is a serious problem? An allergic reaction could occur after the  vaccinated person leaves the clinic. If you see signs of a severe allergic reaction (hives, swelling of the face and throat, difficulty breathing, a fast heartbeat, dizziness, or  weakness), call 9-1-1 and get the person to the nearest hospital. For other signs that concern you, call your health care provider. Adverse reactions should be reported to the Vaccine Adverse Event Reporting System (VAERS). Your health care provider will usually file this report, or you can do it yourself. Visit the VAERS website at www.vaers.SamedayNews.es or call 225-801-5330. VAERS is only for reporting reactions, and VAERS staff members do not give medical advice. 6. The National Vaccine Injury Compensation Program The [employer] Vaccine Injury Compensation Program (VICP) is a federal program that was created to compensate people who may have been injured by certain vaccines. Claims regarding alleged injury or death due to vaccination have a time limit for filing, which may be as short as two years. Visit the VICP website at GoldCloset.com.ee or call 781-757-8718 to learn about the program and about filing a claim. 7. How can I learn more? Ask your health care provider. Call your local or state health department. Visit the website of the Food and Drug Administration (FDA) for vaccine package inserts and additional information at TraderRating.uy. Contact the Centers for Disease Control and Prevention (CDC): Call 3512424637 (1-800-CDC-INFO) or Visit CDC's website at https://gibson.com/. Vaccine Information Statement Inactivated Influenza Vaccine (05/28/2020) This information is not intended to replace advice given to you by your health care provider. Make sure you discuss any questions you have with your healthcare provider. Document Revised: 07/15/2020 Document Reviewed: 07/15/2020 Elsevier Patient Education  2022 [employer].

## 2021-06-17 NOTE — Progress Notes (Signed)
Subjective:    Patient ID: Amy Mooney, female    DOB: 02-Feb-1966, 55 y.o.   MRN: [id number]  HPI  Amy Mooney is a very pleasant 56 y.o. female with a history of hypertension, obesity, hyperlipidemia, OSA, chronic knee pain who presents today to discuss bariatric surgery.  She is considering bariatric surgery, likely lap band, has an informational packet with forms to complete. She would like to proceed with weight loss surgery so she can feel better. She's tired of her chronic knee pain, tired of not being able to play with her grandchildren, and her exertional shortness of breath.   She has been overweight since 1988 after her first pregnancy. She's been struggling with weight loss over the last 5-6 years.   She followed with [facility] for about one year, was on Saxenda, lost some weight but regained.   She's also been managed on Phentermine, tried weight watchers, calorie counting, [employer] exercising without long lasting effects,   Diet currently consists of:  Breakfast: Protein shake, powder Lunch: Left overs, home  made sandwich Dinner: Meat, vegetable, starch  Snacks: None Desserts: Infrequently  Beverages: Coffee, water, sweet tea several times weekly   Exercise: No regular exercise   Wt Readings from Last 3 Encounters:  06/17/21 260 lb (117.9 kg)  10/21/20 246 lb (111.6 kg)  09/03/20 246 lb 6.4 oz (111.8 kg)   She has checked her BP at work a few times which runs 140's/80's. She is compliant to her HCTZ 25 mg.   BP Readings from Last 3 Encounters:  06/17/21 (!) 146/82  10/21/20 (!) 166/84  09/18/20 (!) 168/93      Review of Systems  Cardiovascular:  Negative for chest pain.  Musculoskeletal:  Positive for arthralgias.       Chronic bilateral knee pain        Past Medical History:  Diagnosis Date   Allergy    Chronic knee pain    arthritis   Dysplastic nevus 02/13/2021   right upper back paraspinal, mod to  severe atypia    Fever blister    history of   History of kidney stones    Hypertension    Migraines    Morbid obesity with BMI of 40.0-44.9, adult (HCC)    Narcolepsy without cataplexy(347.00)    Oral herpes    Osteoarthritis    Restless leg    Sleep apnea    per pt not treated; not able to use CPAP due to claustrophobia   Vitamin D deficiency     Social History   Socioeconomic History   Marital status: Married    Spouse name: Myrian Kret   Number of children: 2   Years of education: College   Highest education level: Not on file  Occupational History   Occupation: CMA    Employer: [employer]  Tobacco Use   Smoking status: Former    Packs/day: 0.50    Years: 4.00    Pack years: 2.00    Types: Cigarettes    Quit date: 10/23/1986    Years since quitting: 34.6   Smokeless tobacco: Never  Vaping Use   Vaping Use: Never used  Substance and Sexual Activity   Alcohol use: No   Drug use: No   Sexual activity: Yes    Birth control/protection: None  Other Topics Concern   Not on file  Social History Narrative   [name])  Patient has two adult children.   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   Patient has an college education.   Patient is right-handed.   Patient drinks very little caffeine.   Enjoys spending time with family.    Social Determinants of Health   Financial Resource Strain: Not on file  Food Insecurity: Not on file  Transportation Needs: Not on file  Physical Activity: Not on file  Stress: Not on file  Social Connections: Not on file  Intimate Partner Violence: Not on file    Past Surgical History:  Procedure Laterality Date   ABDOMINAL HYSTERECTOMY  2010   artroscopic meniscus repair Bilateral    cervix removed, bladder tack     2015   COLONOSCOPY WITH PROPOFOL N/A 06/19/2017   Procedure: COLONOSCOPY WITH PROPOFOL;  Surgeon: [name], MD;  Location: [facility];  Service:  Endoscopy;  Laterality: N/A;   LAPAROSCOPIC SALPINGO OOPHERECTOMY Right 03/22/2017   Procedure: LAPAROSCOPIC SALPINGO OOPHORECTOMY;  Surgeon: Ward, Honor Loh, MD;  Location: [facility];  Service: Gynecology;  Laterality: Right;   TOTAL KNEE ARTHROPLASTY Right 10/29/2018   Procedure: TOTAL KNEE ARTHROPLASTY;  Surgeon: Carole Civil, MD;  Location: [facility];  Service: Orthopedics;  Laterality: Right;   TOTAL KNEE ARTHROPLASTY Left 05/27/2019   Procedure: TOTAL KNEE ARTHROPLASTY;  Surgeon: Carole Civil, MD;  Location: [facility];  Service: Orthopedics;  Laterality: Left;    Family History  Problem Relation Age of Onset   Breast cancer Mother 83   Diabetes Mother    Depression Mother    Anxiety disorder Mother    Obesity Mother    Breast cancer Paternal Grandmother     Allergies  Allergen Reactions   Nuvigil [Armodafinil] Hives   Penicillins Rash and Other (See Comments)    Has patient had a PCN reaction causing immediate rash, facial/tongue/throat swelling, SOB or lightheadedness with hypotension: Unknown Has patient had a PCN reaction causing severe rash involving mucus membranes or skin necrosis: Unknown Has patient had a PCN reaction that required hospitalization: Unknown Has patient had a PCN reaction occurring within the last 10 years: Unknown If all of the above answers are "NO", then may proceed with Cephalosporin use.    Vancomycin Hives   Lyrica [Pregabalin] Hives   Bupropion Other (See Comments)    Causes migraines    Current Outpatient Medications on File Prior to Visit  Medication Sig Dispense Refill   Calcium Carb-Cholecalciferol (CALCIUM + D3 PO) Take 1 tablet by mouth at bedtime.     Cholecalciferol (VITAMIN D3) 5000 units CAPS Take 5,000 Units by mouth at bedtime.      clindamycin (CLEOCIN) 150 MG capsule 300 MG HALF HOUR BEFORE CLEANING 40 capsule 0   Desoximetasone 0.05 % GEL Apply 1 application topically daily. 60 g 1   diclofenac (CATAFLAM) 50 MG tablet TAKE  1 TABLET BY MOUTH 2 TIMES DAILY. 90 tablet 3   Doxycycline Hyclate 50 MG TABS TAKE ONE TABLET BY MOUTH TWICE A DAY WITH FOOD AND PLENTY OF FLUID 60 tablet 5   hydrochlorothiazide (HYDRODIURIL) 25 MG tablet TAKE 1 TABLET BY MOUTH DAILY FOR BLOOD PRESSURE. 90 tablet 1   loratadine (CLARITIN) 10 MG tablet Take 10 mg by mouth daily as needed for allergies.     Multiple Vitamins-Minerals (HAIR SKIN AND NAILS FORMULA PO) Take 1 tablet by mouth daily.     Multiple Vitamins-Minerals (MULTIVITAMIN WITH MINERALS) tablet Take 1 tablet by mouth daily.     rizatriptan (MAXALT)  10 MG tablet TAKE 1 TABLET BY MOUTH AT MIGRAINE ONSET MAY REPEAT IN 2 HOURS IF HEADACHE PERSISTS.NOT EXCEED 2 TABLETS IN 24HRS 10 tablet 0   triamcinolone (KENALOG) 0.1 % APPLY TO THE AFFECTED AREA(S) TWO TIMES DAILY AS NEEDED 80 g 1   valACYclovir (VALTREX) 1000 MG tablet TAKE 2 TABLETS (2,000 MG) BY MOUTH AS DIRECTED AT ONSET OF BLISTER AND 2 TABLETS 12 HOURS LATER 30 tablet 11   Efinaconazole (JUBLIA) 10 % SOLN Apply 1 application topically at bedtime. Apply to affected nails qhs (Patient not taking: Reported on 06/17/2021) 8 mL 11   fluconazole (DIFLUCAN) 150 MG tablet Take 150 mg by mouth once. (Patient not taking: Reported on 06/17/2021)     pramipexole (MIRAPEX) 0.25 MG tablet TAKE 1 TABLET BY MOUTH DAILY 30 MINUTES TO 1 HOUR PRIOR TO BEDTIME (Patient not taking: Reported on 06/17/2021) 90 tablet 3   scopolamine (TRANSDERM-SCOP) 1 MG/3DAYS PLACE 1 PATCH (1.5 MG TOTAL) ONTO THE SKIN EVERY 3 (THREE) DAYS. (Patient not taking: Reported on 06/17/2021) 5 patch 0   No current facility-administered medications on file prior to visit.    BP (!) 146/82   Pulse 78   Temp 98.3 F (36.8 C) (Temporal)   Ht '5\' 3"'$  (1.6 m)   Wt 260 lb (117.9 kg)   SpO2 98%   BMI 46.06 kg/m  Objective:   Physical Exam Cardiovascular:     Rate and Rhythm: Normal rate and regular rhythm.  Pulmonary:     Effort: Pulmonary effort is normal.     Breath sounds:  Normal breath sounds.  Musculoskeletal:     Cervical back: Neck supple.  Skin:    General: Skin is warm and dry.  Psychiatric:        Mood and Affect: Mood normal.          Assessment & Plan:      This visit occurred during the SARS-CoV-2 public health emergency.  Safety protocols were in place, including screening questions prior to the visit, additional usage of staff PPE, and extensive cleaning of exam room while observing appropriate contact time as indicated for disinfecting solutions.

## 2021-06-17 NOTE — Assessment & Plan Note (Signed)
Above goal today, also with readings at work. Suspect weight gain to be contributing, discussed this today.  Continue HCTZ 25 mg for now, she will closely monitor at work and update. Consider addition of ARB if no improvement.

## 2021-06-17 NOTE — Assessment & Plan Note (Signed)
Chronic for years with numerous unsuccessful attempts at sustained weight loss.   Agree that she would be a good candidate for weight loss surgery, she is motivated to make a permanent change.  Will complete forms and fax over to [facility].

## 2021-06-18 ENCOUNTER — Encounter: Payer: Self-pay | Admitting: Primary Care

## 2021-06-23 DIAGNOSIS — I10 Essential (primary) hypertension: Secondary | ICD-10-CM | POA: Diagnosis not present

## 2021-06-23 DIAGNOSIS — G4733 Obstructive sleep apnea (adult) (pediatric): Secondary | ICD-10-CM | POA: Diagnosis not present

## 2021-06-23 DIAGNOSIS — Z6841 Body Mass Index (BMI) 40.0 and over, adult: Secondary | ICD-10-CM | POA: Diagnosis not present

## 2021-06-28 ENCOUNTER — Other Ambulatory Visit (HOSPITAL_COMMUNITY): Payer: Self-pay | Admitting: Surgery

## 2021-06-28 ENCOUNTER — Other Ambulatory Visit: Payer: Self-pay | Admitting: Surgery

## 2021-07-05 ENCOUNTER — Encounter: Payer: Self-pay | Admitting: Primary Care

## 2021-07-06 ENCOUNTER — Other Ambulatory Visit: Payer: Self-pay | Admitting: Primary Care

## 2021-07-06 ENCOUNTER — Other Ambulatory Visit: Payer: Self-pay | Admitting: Internal Medicine

## 2021-07-06 DIAGNOSIS — G2581 Restless legs syndrome: Secondary | ICD-10-CM

## 2021-07-06 DIAGNOSIS — I10 Essential (primary) hypertension: Secondary | ICD-10-CM

## 2021-07-06 MED FILL — Diclofenac Potassium Tab 50 MG: ORAL | 45 days supply | Qty: 90 | Fill #1 | Status: AC

## 2021-07-07 ENCOUNTER — Other Ambulatory Visit: Payer: Self-pay | Admitting: Internal Medicine

## 2021-07-07 ENCOUNTER — Other Ambulatory Visit: Payer: Self-pay

## 2021-07-07 DIAGNOSIS — G2581 Restless legs syndrome: Secondary | ICD-10-CM

## 2021-07-07 MED ORDER — HYDROCHLOROTHIAZIDE 25 MG PO TABS
ORAL_TABLET | Freq: Every day | ORAL | 0 refills | Status: DC
  Filled 2021-07-07: qty 90, 90d supply, fill #0

## 2021-07-08 ENCOUNTER — Other Ambulatory Visit: Payer: Self-pay | Admitting: Primary Care

## 2021-07-08 ENCOUNTER — Other Ambulatory Visit: Payer: Self-pay

## 2021-07-08 DIAGNOSIS — G2581 Restless legs syndrome: Secondary | ICD-10-CM

## 2021-07-08 MED FILL — Pramipexole Dihydrochloride Tab 0.25 MG: ORAL | 90 days supply | Qty: 90 | Fill #0 | Status: CN

## 2021-07-11 ENCOUNTER — Other Ambulatory Visit: Payer: Self-pay

## 2021-07-15 ENCOUNTER — Ambulatory Visit (HOSPITAL_COMMUNITY)
Admission: RE | Admit: 2021-07-15 | Discharge: 2021-07-15 | Disposition: A | Discharge status: Home / Self Care | Payer: 59 | Source: Ambulatory Visit | Attending: Surgery | Admitting: Surgery

## 2021-07-15 ENCOUNTER — Other Ambulatory Visit: Payer: Self-pay

## 2021-07-15 DIAGNOSIS — M47814 Spondylosis without myelopathy or radiculopathy, thoracic region: Secondary | ICD-10-CM | POA: Diagnosis not present

## 2021-07-15 DIAGNOSIS — Z01818 Encounter for other preprocedural examination: Secondary | ICD-10-CM | POA: Diagnosis not present

## 2021-07-21 ENCOUNTER — Encounter: Payer: Self-pay | Admitting: Dietician

## 2021-07-21 ENCOUNTER — Other Ambulatory Visit: Payer: Self-pay

## 2021-07-21 ENCOUNTER — Encounter: Payer: 59 | Attending: Surgery | Admitting: Dietician

## 2021-07-21 DIAGNOSIS — Z713 Dietary counseling and surveillance: Secondary | ICD-10-CM | POA: Diagnosis not present

## 2021-07-21 DIAGNOSIS — I10 Essential (primary) hypertension: Secondary | ICD-10-CM | POA: Diagnosis not present

## 2021-07-21 DIAGNOSIS — Z6841 Body Mass Index (BMI) 40.0 and over, adult: Secondary | ICD-10-CM | POA: Insufficient documentation

## 2021-07-21 NOTE — Patient Instructions (Addendum)
Practice eating meals without water/ beverage. Keep glass away from the table, fill with less water, or use a smaller glass. Work on chewing foods thoroughly.  Check [employer] website/ app for tracking intake after surgery along with food/ meal ideas.

## 2021-07-21 NOTE — Progress Notes (Signed)
Nutrition Assessment for Bariatric Surgery   Appointment start time: 1330   end time: 1430  Planned Surgery: lap band Decision came after on and off dieting for many years; considered surgery several years ago but did not complete.   Anthropometrics: Weight: 261.6lbs Height: 5'3" BMI: 45.97   Patient's Goal Weight: about 160lbs (100lb weight loss)  Clinical: Medical History:   hypertension Medications and Supplements: diclofenac, doxycycline, HCTZ, loratadine, rizatriptan, valACYclovir, pramipexole, multivitamin, hair skin and nails vitamin supplement Relevant labs: 03/04/21 HbA1C 5.8% Notable symptoms:   frequent stiffness in knee (right), pain after increased movement Drug allergies:   penicillins, vancomycin, nuvigil, welbutrin Food allergies:   no allergies; does not eat seafood  Lifestyle and Dietary History:  Dieting/ weight history: [facility] [employer] program for over 1 year with limited success, included Saxenda for some time, but it did not help with weight loss; Phentermine also did not help Weight watchers with minimal success   Disordered or emotional eating history:  No eating disorder diagnosis Occasional eating due to boredom, no other emotional eating  Physical activity: some on the job activity; no regular exercise due to knee pain. Had bilateral knee replacements.   Dietary Recall:  Daily pattern: 3 meals and 1 snacks. Dining out: 2 meals per week. Breakfast: toast, coffee  Snack: none Lunch: brings from home -- sandwich; leftovers from supper Snack: usually none; occasionally nuts or peanut butter Supper: meat + veg ie green beans often (quickest) + starchy veg Snack: ice cream; other sweet; occasionally coffee Beverages: water; coffee in am, + sometimes at night; sweet tea, soda very rarely   Nutrition Intervention: Instructed patient on pre-op diet goals and importance of close adherence to bariatric diet after surgery to avoid side effects and  complications.  Discussed stages of the bariatric diet after surgery as well as the importance of adequate protein and fluid intake; options for monitoring intake. Instructed on protein and fluid goals, role of protein in post-op diet, planning and timing food and fluid intake, and mechanics of eating to avoid side effects. Provided overview of 2-week pre-op diet, importance of vitamin and mineral supplementation after surgery. Discussed importance of good support from others during the weight loss journey.  Nutrition Diagnosis: [place]-3.3 Overweight/ obesity related to history of excess calories and inadequate physical activity as evidenced by patient with current BMI of 45.97, making dietary changes for weight loss prior to bariatric surgery.  Teaching method(s) utilized: Copy provided: Pre-op Goals Diet Stage Template Visit summary with goals/ instructions to be viewed via MyChart  Learning Readiness: Ready for change  Barriers to learning/ implementing lifestyle change: none  Demonstrated degree of understanding via: Teach Back  Summary: Patient voices readiness to make diet and lifestyle changes in effort to lose weight and prepare for bariatric surgery.  Patient's goal for weight loss is improved knee pain/ stiffness, reduction of health risk. She has solid support from spouse.  She agrees to work on reducing fluids with meals and chewing foods more prior to surgery.  She understands and is motivated to follow the bariatric diet after surgery.  From a nutrition standpoint, she is ready to proceed with the bariatric program and will be a good candidate for surgery.    Plan: Patient commits to returning for  pre-op class prior to surgery.  She will plan to return for post-op RD visits beginning 2 weeks after surgery.

## 2021-07-22 ENCOUNTER — Other Ambulatory Visit: Payer: Self-pay

## 2021-07-27 ENCOUNTER — Other Ambulatory Visit: Payer: Self-pay

## 2021-08-03 ENCOUNTER — Other Ambulatory Visit: Payer: Self-pay

## 2021-08-03 MED FILL — Pramipexole Dihydrochloride Tab 0.25 MG: ORAL | 90 days supply | Qty: 90 | Fill #0 | Status: AC

## 2021-08-15 ENCOUNTER — Other Ambulatory Visit: Payer: Self-pay | Admitting: Dermatology

## 2021-08-15 DIAGNOSIS — H5213 Myopia, bilateral: Secondary | ICD-10-CM | POA: Diagnosis not present

## 2021-08-18 MED FILL — Diclofenac Potassium Tab 50 MG: ORAL | 45 days supply | Qty: 90 | Fill #2 | Status: AC

## 2021-08-19 ENCOUNTER — Other Ambulatory Visit: Payer: Self-pay

## 2021-08-23 ENCOUNTER — Ambulatory Visit (INDEPENDENT_AMBULATORY_CARE_PROVIDER_SITE_OTHER): Payer: 59 | Admitting: Psychology

## 2021-08-23 ENCOUNTER — Other Ambulatory Visit: Payer: Self-pay

## 2021-09-06 ENCOUNTER — Ambulatory Visit (INDEPENDENT_AMBULATORY_CARE_PROVIDER_SITE_OTHER): Payer: 59 | Admitting: Psychology

## 2021-09-23 ENCOUNTER — Encounter: Payer: 59 | Admitting: Primary Care

## 2021-09-26 ENCOUNTER — Other Ambulatory Visit: Payer: Self-pay | Admitting: Dermatology

## 2021-09-26 ENCOUNTER — Other Ambulatory Visit: Payer: Self-pay

## 2021-09-26 MED ORDER — HYDROCOD POLST-CPM POLST ER 10-8 MG/5ML PO SUER: 5.0000 mL | Freq: Every evening | ORAL | 0 refills | Status: AC | PRN

## 2021-10-05 ENCOUNTER — Other Ambulatory Visit: Payer: Self-pay | Admitting: Primary Care

## 2021-10-05 DIAGNOSIS — I10 Essential (primary) hypertension: Secondary | ICD-10-CM

## 2021-10-05 MED FILL — Valacyclovir HCl Tab 1 GM: ORAL | 7 days supply | Qty: 30 | Fill #0 | Status: AC

## 2021-10-06 ENCOUNTER — Other Ambulatory Visit: Payer: Self-pay

## 2021-10-06 ENCOUNTER — Ambulatory Visit (INDEPENDENT_AMBULATORY_CARE_PROVIDER_SITE_OTHER): Payer: 59 | Admitting: Dermatology

## 2021-10-06 DIAGNOSIS — D229 Melanocytic nevi, unspecified: Secondary | ICD-10-CM

## 2021-10-06 DIAGNOSIS — L821 Other seborrheic keratosis: Secondary | ICD-10-CM | POA: Diagnosis not present

## 2021-10-06 DIAGNOSIS — D2261 Melanocytic nevi of right upper limb, including shoulder: Secondary | ICD-10-CM

## 2021-10-06 DIAGNOSIS — D492 Neoplasm of unspecified behavior of bone, soft tissue, and skin: Secondary | ICD-10-CM

## 2021-10-06 DIAGNOSIS — D224 Melanocytic nevi of scalp and neck: Secondary | ICD-10-CM

## 2021-10-06 MED ORDER — HYDROCHLOROTHIAZIDE 25 MG PO TABS
ORAL_TABLET | Freq: Every day | ORAL | 2 refills | Status: DC
  Filled 2021-10-06: qty 90, 90d supply, fill #0
  Filled 2022-02-23: qty 90, 90d supply, fill #1
  Filled 2022-05-25: qty 90, 90d supply, fill #2

## 2021-10-06 NOTE — Progress Notes (Signed)
° °  Follow-Up Visit   Subjective  Amy Mooney is a 55 y.o. female who presents for the following: check spots (L jaw, just noticed/Chest, just noticed/L neck, just noticed) and Nevus (R tricep, check). The patient has spots, moles and lesions to be evaluated, some may be new or changing and the patient has concerns that these could be abnormal.  The following portions of the chart were reviewed this encounter and updated as appropriate:   Tobacco   Allergies   Meds   Problems   Med Hx   Surg Hx   Fam Hx      Review of Systems:  No other skin or systemic complaints except as noted in HPI or Assessment and Plan.  Objective  Well appearing patient in no apparent distress; mood and affect are within normal limits.  A focused examination was performed including face, neck, chest, right arm. Relevant physical exam findings are noted in the Assessment and Plan.  chest, L mandible Brown macules       R tricep x 2 0.35cm regular brown macule 0.4cm regular brown macule     L of midline ant base of neck 0.4cm irregular brown macule       Assessment & Plan  Seborrheic keratosis vs Lentigo chest, L mandible Benign-appearing.  Observation.  Call clinic for new or changing lesions.  Recommend daily use of broad spectrum spf 30+ sunscreen to sun-exposed areas.   Nevus R tricep x 2 See photos Benign-appearing.  Observation.  Call clinic for new or changing moles.  Recommend daily use of broad spectrum spf 30+ sunscreen to sun-exposed areas.    Neoplasm of skin L of midline ant base of neck Epidermal / dermal shaving  Lesion diameter (cm):  0.4 Informed consent: discussed and consent obtained   Timeout: patient name, date of birth, surgical site, and procedure verified   Procedure prep:  Patient was prepped and draped in usual sterile fashion Prep type:  Isopropyl alcohol Anesthesia: the lesion was anesthetized in a standard fashion   Anesthetic:  1% lidocaine w/  epinephrine 1-100,000 buffered w/ 8.4% NaHCO3 Instrument used: flexible razor blade   Hemostasis achieved with: pressure, aluminum chloride and electrodesiccation   Outcome: patient tolerated procedure well   Post-procedure details: sterile dressing applied and wound care instructions given   Dressing type: bandage and petrolatum    Specimen 1 - Surgical pathology Differential Diagnosis: D48.5 Nevus vs Dysplastic Nevus Check Margins: yes 0.4cm irregular brown macule  Return if symptoms worsen or fail to improve.  I, Othelia Pulling, RMA, am acting as scribe for Sarina Ser, MD . Documentation: I have reviewed the above documentation for accuracy and completeness, and I agree with the above.  Sarina Ser, MD

## 2021-10-06 NOTE — Patient Instructions (Addendum)

## 2021-10-10 ENCOUNTER — Telehealth: Payer: Self-pay

## 2021-10-10 NOTE — Telephone Encounter (Signed)
-----   Message from Ralene Bathe, MD sent at 10/07/2021  6:12 PM EST ----- Diagnosis Skin , left of midline ant base of neck DYSPLASTIC JUNCTIONAL LENTIGINOUS NEVUS WITH MODERATE ATYPIA, CLOSE TO MARGIN  Moderate dysplastic Recheck next visit

## 2021-10-10 NOTE — Telephone Encounter (Signed)
Informed pt of results. She had no concerns.

## 2021-10-11 ENCOUNTER — Encounter: Payer: Self-pay | Admitting: Dermatology

## 2021-10-28 ENCOUNTER — Other Ambulatory Visit: Payer: Self-pay

## 2021-10-28 ENCOUNTER — Encounter: Payer: Self-pay | Admitting: Primary Care

## 2021-10-28 ENCOUNTER — Ambulatory Visit (INDEPENDENT_AMBULATORY_CARE_PROVIDER_SITE_OTHER): Payer: 59 | Admitting: Primary Care

## 2021-10-28 VITALS — BP 126/72 | HR 65 | Temp 97.8°F | Ht 63.25 in | Wt 260.0 lb

## 2021-10-28 DIAGNOSIS — E7849 Other hyperlipidemia: Secondary | ICD-10-CM

## 2021-10-28 DIAGNOSIS — F3289 Other specified depressive episodes: Secondary | ICD-10-CM

## 2021-10-28 DIAGNOSIS — Z8619 Personal history of other infectious and parasitic diseases: Secondary | ICD-10-CM

## 2021-10-28 DIAGNOSIS — E559 Vitamin D deficiency, unspecified: Secondary | ICD-10-CM

## 2021-10-28 DIAGNOSIS — G2581 Restless legs syndrome: Secondary | ICD-10-CM | POA: Diagnosis not present

## 2021-10-28 DIAGNOSIS — M19042 Primary osteoarthritis, left hand: Secondary | ICD-10-CM

## 2021-10-28 DIAGNOSIS — Z1231 Encounter for screening mammogram for malignant neoplasm of breast: Secondary | ICD-10-CM | POA: Diagnosis not present

## 2021-10-28 DIAGNOSIS — I10 Essential (primary) hypertension: Secondary | ICD-10-CM

## 2021-10-28 DIAGNOSIS — Z0000 Encounter for general adult medical examination without abnormal findings: Secondary | ICD-10-CM | POA: Diagnosis not present

## 2021-10-28 DIAGNOSIS — G4733 Obstructive sleep apnea (adult) (pediatric): Secondary | ICD-10-CM | POA: Diagnosis not present

## 2021-10-28 DIAGNOSIS — G43009 Migraine without aura, not intractable, without status migrainosus: Secondary | ICD-10-CM | POA: Diagnosis not present

## 2021-10-28 DIAGNOSIS — M19041 Primary osteoarthritis, right hand: Secondary | ICD-10-CM | POA: Insufficient documentation

## 2021-10-28 NOTE — Patient Instructions (Signed)
[facility] to [facility] your [facility].   Start omeprazole at bedtime for night cough.  It was a pleasure to see you today!  Preventive Care 83-56 Years Old, Female Preventive care refers to lifestyle choices and visits with your health care provider that can promote health and wellness. Preventive care visits are also called wellness exams. What can I expect for my preventive care visit? Counseling Your health care provider may ask you questions about your: Medical history, including: Past medical problems. Family medical history. Pregnancy history. Current health, including: Menstrual cycle. Method of birth control. Emotional well-being. Home life and relationship well-being. Sexual activity and sexual health. Lifestyle, including: Alcohol, nicotine or tobacco, and drug use. Access to firearms. Diet, exercise, and sleep habits. Work and work Statistician. Sunscreen use. Safety issues such as seatbelt and bike helmet use. Physical exam Your health care provider will check your: Height and weight. These may be used to calculate your BMI (body mass index). BMI is a measurement that tells if you are at a healthy weight. Waist circumference. This measures the distance around your waistline. This measurement also tells if you are at a healthy weight and may help predict your risk of certain diseases, such as type 2 diabetes and high blood pressure. Heart rate and blood pressure. Body temperature. Skin for abnormal spots. What immunizations do I need? Vaccines are usually given at various ages, according to a schedule. Your health care provider will recommend vaccines for you based on your age, medical history, and lifestyle or other factors, such as travel or where you work. What tests do I need? Screening Your health care provider may recommend screening tests for certain conditions. This may include: Lipid and cholesterol levels. Diabetes screening. This is done by  checking your blood sugar (glucose) after you have not eaten for a while (fasting). Pelvic exam and Pap test. Hepatitis B test. Hepatitis C test. HIV (human immunodeficiency virus) test. STI (sexually transmitted infection) testing, if you are at risk. Lung cancer screening. Colorectal cancer screening. Mammogram. Talk with your health care provider about when you should start having regular mammograms. This may depend on whether you have a family history of breast cancer. BRCA-related cancer screening. This may be done if you have a family history of breast, ovarian, tubal, or peritoneal cancers. Bone density scan. This is done to screen for osteoporosis. Talk with your health care provider about your test results, treatment options, and if necessary, the need for more tests. Follow these instructions at home: Eating and drinking  Eat a diet that includes fresh fruits and vegetables, whole grains, lean protein, and low-fat dairy products. Take vitamin and mineral supplements as recommended by your health care provider. Do not drink alcohol if: Your health care provider tells you not to drink. You are pregnant, may be pregnant, or are planning to become pregnant. If you drink alcohol: Limit how much you have to 0-1 drink a day. Know how much alcohol is in your drink. In the [place] one drink equals one 12 oz bottle of beer (355 mL), one 5 oz glass of wine (148 mL), or one 1 oz glass of hard liquor (44 mL). Lifestyle Brush your teeth every morning and night with fluoride toothpaste. Floss one time each day. Exercise for at least 30 minutes 5 or more days each week. Do not use any products that contain nicotine or tobacco. These products include cigarettes, chewing tobacco, and vaping devices, such as e-cigarettes. If you need help quitting,  ask your health care provider. Do not use drugs. If you are sexually active, practice safe sex. Use a condom or other form of protection to prevent  STIs. If you do not wish to become pregnant, use a form of birth control. If you plan to become pregnant, see your health care provider for a prepregnancy visit. Take aspirin only as told by your health care provider. Make sure that you understand how much to take and what form to take. Work with your health care provider to find out whether it is safe and beneficial for you to take aspirin daily. Find healthy ways to manage stress, such as: Meditation, yoga, or listening to music. Journaling. Talking to a trusted person. Spending time with friends and family. Minimize exposure to UV radiation to reduce your risk of skin cancer. Safety Always wear your seat belt while driving or riding in a vehicle. Do not drive: If you have been drinking alcohol. Do not ride with someone who has been drinking. When you are tired or distracted. While texting. If you have been using any mind-altering substances or drugs. Wear a helmet and other protective equipment during sports activities. If you have firearms in your house, make sure you follow all gun safety procedures. Seek help if you have been physically or sexually abused. What's next? Visit your health care provider once a year for an annual wellness visit. Ask your health care provider how often you should have your eyes and teeth checked. Stay up to date on all vaccines. This information is not intended to replace advice given to you by your health care provider. Make sure you discuss any questions you have with your health care provider. Document Revised: 04/06/2021 Document Reviewed: 04/06/2021 Elsevier Patient Education  [place].

## 2021-10-28 NOTE — Assessment & Plan Note (Signed)
Diet controlled. Reviewed lipid panel from [facility] in September 2022.

## 2021-10-28 NOTE — Progress Notes (Signed)
Subjective:    Patient ID: Amy Mooney, female    DOB: 1966/04/14, 56 y.o.   MRN: [id number]  HPI  EDLYN Mooney is a very pleasant 56 y.o. female who presents today for complete physical and follow up of chronic conditions.  She had a full panel of labs in September 2022 at [facility].  Immunizations: -Tetanus: 2017 -Influenza: Completed this season -Covid-19: 2 vaccines  -Shingles: Never completed, declines   Diet: Fair diet.  Exercise: No regular exercise. Active at work.   Eye exam: Completes annually  Dental exam: Completes semi-annually   Pap Smear: Hysterectomy  Mammogram: Completed in February 2021 Colonoscopy: Completed in 2018, due 2028  BP Readings from Last 3 Encounters:  10/28/21 126/72  06/17/21 (!) 146/82  10/21/20 (!) 166/84      Review of Systems  Constitutional:  Negative for unexpected weight change.  HENT:  Negative for rhinorrhea.   Respiratory:  Positive for cough. Negative for shortness of breath.        Night cough since having influenza in late November 2022  Cardiovascular:  Negative for chest pain.  Gastrointestinal:  Negative for constipation and diarrhea.  Genitourinary:  Negative for difficulty urinating.  Musculoskeletal:  Positive for arthralgias. Negative for myalgias.  Skin:  Negative for rash.  Allergic/Immunologic: Negative for environmental allergies.  Neurological:  Negative for dizziness and headaches.  Psychiatric/Behavioral:  The patient is not nervous/anxious.         Past Medical History:  Diagnosis Date   Allergy    Chronic knee pain    arthritis   Dysplastic nevus 02/13/2021   right upper back paraspinal, mod to severe atypia    Dysplastic nevus 10/06/2021   left of midline ant base of neck, moderate   Fever blister    history of   History of kidney stones    Hypertension    Migraines    Morbid obesity with BMI of 40.0-44.9, adult (HCC)    Narcolepsy without cataplexy(347.00)    Oral herpes     Osteoarthritis    Restless leg    Sleep apnea    per pt not treated; not able to use CPAP due to claustrophobia   Vitamin D deficiency     Social History   Socioeconomic History   Marital status: Married    Spouse name: Amy Mooney   Number of children: 2   Years of education: [employer] education level: Not on file  Occupational History   Occupation: CMA    Employer: [employer]  Tobacco Use   Smoking status: Former    Packs/day: 0.50    Years: 4.00    Pack years: 2.00    Types: Cigarettes    Quit date: 10/23/1986    Years since quitting: 35.0   Smokeless tobacco: Never  Vaping Use   Vaping Use: Never used  Substance and Sexual Activity   Alcohol use: No   Drug use: No   Sexual activity: Yes    Birth control/protection: None  Other Topics Concern   Not on file  Social History Narrative   Patient lives at home with her husband Amy Mooney)   Patient has two adult children.   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   Patient has an college education.   Patient is right-handed.   Patient drinks very little caffeine.   Enjoys spending time with family.    Social Determinants of Health   Financial Resource Strain: Not on file  Food Insecurity: Not on file  Transportation Needs: Not on file  Physical Activity: Not on file  Stress: Not on file  Social Connections: Not on file  Intimate Partner Violence: Not on file    Past Surgical History:  Procedure Laterality Date   ABDOMINAL HYSTERECTOMY  2010   artroscopic meniscus repair Bilateral    cervix removed, bladder tack     2015   COLONOSCOPY WITH PROPOFOL N/A 06/19/2017   Procedure: COLONOSCOPY WITH PROPOFOL;  Surgeon: [name], MD;  Location: [facility] ENDOSCOPY;  Service: Endoscopy;  Laterality: N/A;   LAPAROSCOPIC SALPINGO OOPHERECTOMY Right 03/22/2017   Procedure: LAPAROSCOPIC SALPINGO OOPHORECTOMY;  Surgeon: Ward, Honor Loh, MD;  Location: [facility] ORS;  Service: Gynecology;  Laterality:  Right;   TOTAL KNEE ARTHROPLASTY Right 10/29/2018   Procedure: TOTAL KNEE ARTHROPLASTY;  Surgeon: Carole Civil, MD;  Location: [facility];  Service: Orthopedics;  Laterality: Right;   TOTAL KNEE ARTHROPLASTY Left 05/27/2019   Procedure: TOTAL KNEE ARTHROPLASTY;  Surgeon: Carole Civil, MD;  Location: [facility];  Service: Orthopedics;  Laterality: Left;    Family History  Problem Relation Age of Onset   Breast cancer Mother 34   Diabetes Mother    Depression Mother    Anxiety disorder Mother    Obesity Mother    Breast cancer Paternal Grandmother     Allergies  Allergen Reactions   Nuvigil [Armodafinil] Hives   Penicillins Rash and Other (See Comments)    Has patient had a PCN reaction causing immediate rash, facial/tongue/throat swelling, SOB or lightheadedness with hypotension: Unknown Has patient had a PCN reaction causing severe rash involving mucus membranes or skin necrosis: Unknown Has patient had a PCN reaction that required hospitalization: Unknown Has patient had a PCN reaction occurring within the last 10 years: Unknown If all of the above answers are "NO", then may proceed with Cephalosporin use.    Vancomycin Hives   Lyrica [Pregabalin] Hives   Bupropion Other (See Comments)    Causes migraines    Current Outpatient Medications on File Prior to Visit  Medication Sig Dispense Refill   Calcium Carb-Cholecalciferol (CALCIUM + D3 PO) Take 1 tablet by mouth at bedtime.     Cholecalciferol (VITAMIN D3) 5000 units CAPS Take 5,000 Units by mouth at bedtime.      Desoximetasone 0.05 % GEL Apply 1 application topically daily. 60 g 1   diclofenac (CATAFLAM) 50 MG tablet TAKE 1 TABLET BY MOUTH 2 TIMES DAILY. 90 tablet 3   Doxycycline Hyclate 50 MG TABS TAKE ONE TABLET BY MOUTH TWICE A DAY WITH FOOD AND PLENTY OF FLUID 60 tablet 5   hydrochlorothiazide (HYDRODIURIL) 25 MG tablet TAKE 1 TABLET BY MOUTH DAILY FOR BLOOD PRESSURE. 90 tablet 2   loratadine (CLARITIN) 10 MG tablet  Take 10 mg by mouth daily as needed for allergies.     Multiple Vitamins-Minerals (HAIR SKIN AND NAILS FORMULA PO) Take 1 tablet by mouth daily.     Multiple Vitamins-Minerals (MULTIVITAMIN WITH MINERALS) tablet Take 1 tablet by mouth daily.     pramipexole (MIRAPEX) 0.25 MG tablet Take 1 tablet (0.25 mg total) by mouth at bedtime. For restless legs. 90 tablet 0   rizatriptan (MAXALT) 10 MG tablet TAKE 1 TABLET BY MOUTH AT MIGRAINE ONSET MAY REPEAT IN 2 HOURS IF HEADACHE PERSISTS.NOT EXCEED 2 TABLETS IN 24HRS 10 tablet 0   triamcinolone (KENALOG) 0.1 % APPLY TO THE AFFECTED AREA(S) TWO TIMES DAILY AS NEEDED 80 g 1   valACYclovir (  VALTREX) 1000 MG tablet TAKE 2 TABLETS (2,000 MG) BY MOUTH AS DIRECTED AT ONSET OF BLISTER AND 2 TABLETS 12 HOURS LATER 30 tablet 11   No current facility-administered medications on file prior to visit.    BP 126/72    Pulse 65    Temp 97.8 F (36.6 C) (Temporal)    Ht 5' 3.25" (1.607 m)    Wt 260 lb (117.9 kg)    SpO2 97%    BMI 45.69 kg/m  Objective:   Physical Exam HENT:     Right Ear: Tympanic membrane and ear canal normal.     Left Ear: Tympanic membrane and ear canal normal.     Nose: Nose normal.  Eyes:     Conjunctiva/sclera: Conjunctivae normal.     Pupils: Pupils are equal, round, and reactive to light.  Neck:     Thyroid: No thyromegaly.  Cardiovascular:     Rate and Rhythm: Normal rate and regular rhythm.     Heart sounds: No murmur heard. Pulmonary:     Effort: Pulmonary effort is normal.     Breath sounds: Normal breath sounds. No rales.  Abdominal:     General: Bowel sounds are normal.     Palpations: Abdomen is soft.     Tenderness: There is no abdominal tenderness.  Musculoskeletal:        General: Normal range of motion.     Cervical back: Neck supple.     Comments: Heberden's nodes noted to bilateral hands  Lymphadenopathy:     Cervical: No cervical adenopathy.  Skin:    General: Skin is warm and dry.     Findings: No rash.   Neurological:     Mental Status: She is alert and oriented to person, place, and time.     Cranial Nerves: No cranial nerve deficit.     Deep Tendon Reflexes: Reflexes are normal and symmetric.  Psychiatric:        Mood and Affect: Mood normal.          Assessment & Plan:      This visit occurred during the SARS-CoV-2 public health emergency.  Safety protocols were in place, including screening questions prior to the visit, additional usage of staff PPE, and extensive cleaning of exam room while observing appropriate contact time as indicated for disinfecting solutions.

## 2021-10-28 NOTE — Assessment & Plan Note (Signed)
Denies concerns for anxiety and depression.  Continue to monitor.

## 2021-10-28 NOTE — Assessment & Plan Note (Signed)
Doing well on PRN Valtrex 1000 mg, continue same. Follows with derm.

## 2021-10-28 NOTE — Assessment & Plan Note (Signed)
Infrequent, no recent migraine. Continue PRN Maxalt 10 mg.

## 2021-10-28 NOTE — Assessment & Plan Note (Signed)
Controlled. Reviewed labs from [facility] from Sept. 2022

## 2021-10-28 NOTE — Assessment & Plan Note (Signed)
Not wearing CPAP machine as she is claustrophobic.

## 2021-10-28 NOTE — Assessment & Plan Note (Signed)
Declines Shingrix. Other vaccines UTD. Colonoscopy UTD, due 2028. Mammogram due, orders placed.  Discussed the importance of a healthy diet and regular exercise in order for weight loss, and to reduce the risk of further co-morbidity.  Exam today stable. Labs reviewed from [facility]

## 2021-10-28 NOTE — Assessment & Plan Note (Signed)
Controlled on Mirapex 0.25 mg HS, continue same.

## 2021-10-28 NOTE — Assessment & Plan Note (Signed)
Well controlled in the office today, continue HCTZ 25 mg. CMP pending.

## 2021-10-28 NOTE — Assessment & Plan Note (Signed)
Following with orthopedics. Continue PRN Meloxicam

## 2021-10-31 ENCOUNTER — Telehealth: Payer: Self-pay

## 2021-10-31 NOTE — Telephone Encounter (Signed)
Patient called stating that when she puts weight on her left knee it hurts. No trauma, just started hurting on Saturday. She is walking, but with a limp, wants to know if there is something Dr. Aline Brochure can suggest for her to do or does she need to come in sooner than December appointment. Please advise.

## 2021-10-31 NOTE — Telephone Encounter (Signed)
We need to see her sooner, if she has pain with WB and has a total knee replacement, please.

## 2021-11-03 ENCOUNTER — Other Ambulatory Visit: Payer: Self-pay

## 2021-11-03 ENCOUNTER — Ambulatory Visit (INDEPENDENT_AMBULATORY_CARE_PROVIDER_SITE_OTHER): Payer: 59 | Admitting: Orthopedic Surgery

## 2021-11-03 ENCOUNTER — Ambulatory Visit: Payer: 59

## 2021-11-03 DIAGNOSIS — M25562 Pain in left knee: Secondary | ICD-10-CM | POA: Diagnosis not present

## 2021-11-03 DIAGNOSIS — G8929 Other chronic pain: Secondary | ICD-10-CM | POA: Diagnosis not present

## 2021-11-03 DIAGNOSIS — M76892 Other specified enthesopathies of left lower limb, excluding foot: Secondary | ICD-10-CM

## 2021-11-03 DIAGNOSIS — Z96652 Presence of left artificial knee joint: Secondary | ICD-10-CM | POA: Diagnosis not present

## 2021-11-03 NOTE — Patient Instructions (Signed)
Use Aspercreme, Biofreeze or Voltaren gel over the counter 2-3 times daily make sure you rub it in well each time you use it.   Use ice for at least 30 minutes after you get home from work

## 2021-11-03 NOTE — Progress Notes (Signed)
Chief Complaint  Patient presents with   Knee Pain    LT s/p TKR 05/27/19 No known injury    Status post left total knee in 2020 patient presents with atraumatic onset of medial knee pain she says she walks fine but when she takes a step initially or getting out of a chair she has pain around the tibial tubercle but medially  Ambulatory status normal no limp left knee incision looks good no swelling no warmth  Knee feels stable in extension and flexion  Arc of motion is 115 degrees  Tenderness over the pes bursa  X-ray was normal  Inject pes bursa right knee come back in 3 to 4 weeks if repeat injection needed  Procedure note Left knee injection for bursitis   verbal consent was obtained to inject Left knee PES BURSA  Timeout was completed to confirm the site of injection  The medications used were 40 mg of Depo-Medrol and 1% lidocaine 3 cc  Anesthesia was provided by ethyl chloride and the skin was prepped with alcohol.  After cleaning the skin with alcohol a 25-gauge needle was used to inject the left knee bursa   There were no complications and a sterile bandage was applied

## 2021-11-04 ENCOUNTER — Other Ambulatory Visit: Payer: Self-pay

## 2021-11-04 ENCOUNTER — Other Ambulatory Visit: Payer: Self-pay | Admitting: Primary Care

## 2021-11-04 DIAGNOSIS — G2581 Restless legs syndrome: Secondary | ICD-10-CM

## 2021-11-04 MED ORDER — PRAMIPEXOLE DIHYDROCHLORIDE 0.25 MG PO TABS
0.2500 mg | ORAL_TABLET | Freq: Every day | ORAL | 3 refills | Status: DC
  Filled 2021-11-04: qty 90, 90d supply, fill #0
  Filled 2022-02-23: qty 90, 90d supply, fill #1
  Filled 2022-05-25: qty 90, 90d supply, fill #2
  Filled 2022-08-22: qty 90, 90d supply, fill #3

## 2021-11-24 ENCOUNTER — Ambulatory Visit (INDEPENDENT_AMBULATORY_CARE_PROVIDER_SITE_OTHER): Payer: 59 | Admitting: Orthopedic Surgery

## 2021-11-24 ENCOUNTER — Other Ambulatory Visit: Payer: Self-pay

## 2021-11-24 DIAGNOSIS — M76892 Other specified enthesopathies of left lower limb, excluding foot: Secondary | ICD-10-CM

## 2021-11-24 NOTE — Progress Notes (Signed)
Chief Complaint  Patient presents with   Knee Pain    LT/ pain is about the same  PT fell Tuesday 11/22/21    Amy Mooney was given an injection in the pes bursa for bursitis and she thought she was getting better until she had to go to [place] and walk in and out of airports because her mother passed away and then she also fell and her left leg went behind her and she now has a bruise near the place where he had the injection and increased pain there  Exam shows that she is walking with a slight limp she has tenderness at the pes bursa again the knee otherwise feels stable  Impression traumatic bursitis left knee  Recommend injection  Procedure note Left knee injection for bursitis   verbal consent was obtained to inject Left knee PES BURSA  Timeout was completed to confirm the site of injection  The medications used were 40 mg of Depo-Medrol and 1% lidocaine 3 cc  Anesthesia was provided by ethyl chloride and the skin was prepped with alcohol.  After cleaning the skin with alcohol a 25-gauge needle was used to inject the left knee bursa   There were no complications and a sterile bandage was applied

## 2021-12-15 ENCOUNTER — Other Ambulatory Visit: Payer: Self-pay

## 2021-12-15 ENCOUNTER — Encounter (INDEPENDENT_AMBULATORY_CARE_PROVIDER_SITE_OTHER): Payer: Self-pay

## 2021-12-15 ENCOUNTER — Other Ambulatory Visit: Payer: Self-pay | Admitting: Primary Care

## 2021-12-15 DIAGNOSIS — G43001 Migraine without aura, not intractable, with status migrainosus: Secondary | ICD-10-CM

## 2021-12-15 MED ORDER — RIZATRIPTAN BENZOATE 10 MG PO TABS
ORAL_TABLET | ORAL | 0 refills | Status: DC
  Filled 2021-12-15: qty 10, 30d supply, fill #0

## 2021-12-15 MED FILL — Triamcinolone Acetonide Cream 0.1%: CUTANEOUS | 14 days supply | Qty: 80 | Fill #0 | Status: AC

## 2021-12-16 ENCOUNTER — Other Ambulatory Visit: Payer: Self-pay

## 2022-01-04 ENCOUNTER — Encounter: Payer: Self-pay | Admitting: Orthopedic Surgery

## 2022-01-18 ENCOUNTER — Other Ambulatory Visit: Payer: Self-pay

## 2022-01-18 MED ORDER — AZITHROMYCIN 250 MG PO TABS
ORAL_TABLET | ORAL | 0 refills | Status: DC
  Filled 2022-01-18: qty 6, 5d supply, fill #0

## 2022-01-26 ENCOUNTER — Encounter: Payer: Self-pay | Admitting: Nurse Practitioner

## 2022-01-26 ENCOUNTER — Ambulatory Visit: Payer: 59 | Admitting: Nurse Practitioner

## 2022-01-26 ENCOUNTER — Ambulatory Visit
Admission: RE | Admit: 2022-01-26 | Discharge: 2022-01-26 | Disposition: A | Discharge status: Home / Self Care | Payer: 59 | Source: Ambulatory Visit | Attending: Nurse Practitioner | Admitting: Nurse Practitioner

## 2022-01-26 ENCOUNTER — Other Ambulatory Visit: Payer: Self-pay

## 2022-01-26 VITALS — BP 138/82 | HR 68 | Temp 97.5°F | Resp 14 | Ht 63.25 in | Wt 265.5 lb

## 2022-01-26 DIAGNOSIS — R051 Acute cough: Secondary | ICD-10-CM | POA: Diagnosis not present

## 2022-01-26 DIAGNOSIS — R062 Wheezing: Secondary | ICD-10-CM

## 2022-01-26 DIAGNOSIS — R0602 Shortness of breath: Secondary | ICD-10-CM | POA: Diagnosis not present

## 2022-01-26 DIAGNOSIS — R059 Cough, unspecified: Secondary | ICD-10-CM | POA: Diagnosis not present

## 2022-01-26 MED ORDER — METHYLPREDNISOLONE 4 MG PO TBPK
ORAL_TABLET | ORAL | 0 refills | Status: DC
  Filled 2022-01-26: qty 21, 6d supply, fill #0

## 2022-01-26 MED ORDER — HYDROCOD POLI-CHLORPHE POLI ER 10-8 MG/5ML PO SUER
5.0000 mL | Freq: Every evening | ORAL | 0 refills | Status: AC | PRN
  Filled 2022-01-26: qty 50, 10d supply, fill #0

## 2022-01-26 NOTE — Assessment & Plan Note (Signed)
Audible wheezing on exam.  Patient will placed on a Medrol dose pack as she tolerates that better than prednisone.  Did write prescription for Tussionex 5 mg nightly as needed.  Recently treated with azithromycin pack.  Follow-up with improvement ?

## 2022-01-26 NOTE — Patient Instructions (Signed)
Nice to see you today ?I will be in touch in regards to the xray once I have the results ?Follow up if no improvement ? ?Get the xray at [facility] please ?

## 2022-01-26 NOTE — Telephone Encounter (Signed)
Patient called added to Matt's schedule for today.  ?

## 2022-01-26 NOTE — Progress Notes (Signed)
? ?Acute Office Visit ? ?Subjective:  ? ? Patient ID: Amy Mooney, female    DOB: 07/18/1966, 56 y.o.   MRN: [id number] ? ?Chief Complaint  ?Patient presents with  ? Cough  ?  Was treated for sinus infection last week and was treated with Zpak and finished that on 01/23/22. Sinus symptoms improved but now everything is settled down in the chest. Cough started on 01/20/22-wheezing with laying down, cough worse with laying down. Had some left over Tussionex that has helped at night. Covid test done x 3, negative, last one was done 01/25/22. No fever.  ? ? ? ?Patient is in today for cough ? ?States that she had a sinus infection and resolved. Last zpak dose was this past Monday. Covid test x 3  was negative. States that she tried some tussinex that was left over and helped with cough over the last 3 nights. ?Patient has been vaccinated against COVID. ?Patient's flu vaccine up-to-date ? ?Past Medical History:  ?Diagnosis Date  ? Allergy   ? Chronic knee pain   ? arthritis  ? Dysplastic nevus 02/13/2021  ? right upper back paraspinal, mod to severe atypia   ? Dysplastic nevus 10/06/2021  ? left of midline ant base of neck, moderate  ? Fever blister   ? history of  ? History of kidney stones   ? Hypertension   ? Migraines   ? Morbid obesity with BMI of 40.0-44.9, adult ([place])   ? Narcolepsy without cataplexy(347.00)   ? Oral herpes   ? Osteoarthritis   ? Restless leg   ? S/P TKR (total knee replacement), left 05/27/2019 07/10/2019  ? S/P total knee replacement, right 10/29/18   ? Sleep apnea   ? per pt not treated; not able to use CPAP due to claustrophobia  ? Vitamin D deficiency   ? ? ?Past Surgical History:  ?Procedure Laterality Date  ? ABDOMINAL HYSTERECTOMY  2010  ? artroscopic meniscus repair Bilateral   ? cervix removed, bladder tack    ? 2015  ? COLONOSCOPY WITH PROPOFOL N/A 06/19/2017  ? Procedure: COLONOSCOPY WITH PROPOFOL;  Surgeon: [name], MD;  Location: [facility]COPY;  Service: Endoscopy;  Laterality: N/A;  ?  LAPAROSCOPIC SALPINGO OOPHERECTOMY Right 03/22/2017  ? Procedure: LAPAROSCOPIC SALPINGO OOPHORECTOMY;  Surgeon: Ward, Honor Loh, MD;  Location: [facility] ORS;  Service: Gynecology;  Laterality: Right;  ? TOTAL KNEE ARTHROPLASTY Right 10/29/2018  ? Procedure: TOTAL KNEE ARTHROPLASTY;  Surgeon: Carole Civil, MD;  Location: [facility] OR[facility];  Service: Orthopedics;  Laterality: Right;  ? TOTAL KNEE ARTHROPLASTY Left 05/27/2019  ? Procedure: TOTAL KNEE ARTHROPLASTY;  Surgeon: Carole Civil, MD;  Location: [facility] ORS;  Service: Orthopedics;  Laterality: Left;  ? ? ?Family History  ?Problem Relation Age of Onset  ? Breast cancer Mother 37  ? Diabetes Mother   ? Depression Mother   ? Anxiety disorder Mother   ? Obesity Mother   ? Breast cancer Paternal Grandmother   ? ? ?Social History  ? ?Socioeconomic History  ? Marital status: Married  ?  Spouse name: Mykael Trott  ? Number of children: 2  ? Years of education: College  ? Highest education level: Not on file  ?Occupational History  ? Occupation: CMA  ?  Employer: [employer]  ?Tobacco Use  ? Smoking status: Former  ?  Packs/day: 0.50  ?  Years: 4.00  ?  Pack years: 2.00  ?  Types: Cigarettes  ?  Quit date:  10/23/1986  ?  Years since quitting: 35.2  ? Smokeless tobacco: Never  ?Vaping Use  ? Vaping Use: Never used  ?Substance and Sexual Activity  ? Alcohol use: No  ? Drug use: No  ? Sexual activity: Yes  ?  Birth control/protection: None  ?Other Topics Concern  ? Not on file  ?Social History Narrative  ? [name])  ? Patient has two adult children.  ? 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  ? Patient has an college education.  ? Patient is right-handed.  ? Patient drinks very little caffeine.  ? Enjoys spending time with family.   ? ?Social Determinants of Health  ? ?Financial Resource Strain: Not on file  ?Food Insecurity: Not on file  ?Transportation Needs: Not on file  ?Physical Activity: Not on file  ?Stress: Not on file   ?Social Connections: Not on file  ?Intimate Partner Violence: Not on file  ? ? ?Outpatient Medications Prior to Visit  ?Medication Sig Dispense Refill  ? Calcium Carb-Cholecalciferol (CALCIUM + D3 PO) Take 1 tablet by mouth at bedtime.    ? Cholecalciferol (VITAMIN D3) 5000 units CAPS Take 5,000 Units by mouth at bedtime.     ? Desoximetasone 0.05 % GEL Apply 1 application topically daily. 60 g 1  ? Doxycycline Hyclate 50 MG TABS TAKE ONE TABLET BY MOUTH TWICE A DAY WITH FOOD AND PLENTY OF FLUID 60 tablet 5  ? hydrochlorothiazide (HYDRODIURIL) 25 MG tablet TAKE 1 TABLET BY MOUTH DAILY FOR BLOOD PRESSURE. 90 tablet 2  ? loratadine (CLARITIN) 10 MG tablet Take 10 mg by mouth daily as needed for allergies.    ? Multiple Vitamins-Minerals (HAIR SKIN AND NAILS FORMULA PO) Take 1 tablet by mouth daily.    ? Multiple Vitamins-Minerals (MULTIVITAMIN WITH MINERALS) tablet Take 1 tablet by mouth daily.    ? pramipexole (MIRAPEX) 0.25 MG tablet Take 1 tablet (0.25 mg total) by mouth at bedtime. For restless legs. 90 tablet 3  ? rizatriptan (MAXALT) 10 MG tablet Take 1 tablet by mouth at migraine onset.  May repeat in 2 hours if needed.  Do not exceed 2 tablets in 24 hours 10 tablet 0  ? azithromycin (ZITHROMAX Z-PAK) 250 MG tablet Take as directed 6 each 0  ? ?No facility-administered medications prior to visit.  ? ? ?Allergies  ?Allergen Reactions  ? Nuvigil [Armodafinil] Hives  ? Penicillins Rash and Other (See Comments)  ?  Has patient had a PCN reaction causing immediate rash, facial/tongue/throat swelling, SOB or lightheadedness with hypotension: Unknown ?Has patient had a PCN reaction causing severe rash involving mucus membranes or skin necrosis: Unknown ?Has patient had a PCN reaction that required hospitalization: Unknown ?Has patient had a PCN reaction occurring within the last 10 years: Unknown ?If all of the above answers are "NO", then may proceed with Cephalosporin use. ?  ? Vancomycin Hives  ? Lyrica  [Pregabalin] Hives  ? Bupropion Other (See Comments)  ?  Causes migraines  ? ? ?Review of Systems  ?Constitutional:  Positive for fatigue. Negative for appetite change, chills and fever.  ?HENT:  Negative for congestion, postnasal drip, sinus pressure, sinus pain and sore throat.   ?Respiratory:  Positive for cough, shortness of breath (DOE) and wheezing.   ?Cardiovascular:  Negative for chest pain.  ?Gastrointestinal:  Negative for diarrhea, nausea and vomiting.  ?Musculoskeletal:  Negative for arthralgias and myalgias.  ?Neurological:  Positive for headaches (post tussive).  ? ?   ?  Objective:  ?  ?Physical Exam ?Vitals and nursing note reviewed.  ?Constitutional:   ?   Appearance: Normal appearance. She is obese.  ?HENT:  ?   Right Ear: Tympanic membrane, ear canal and external ear normal.  ?   Left Ear: Tympanic membrane, ear canal and external ear normal.  ?   Nose:  ?   Right Sinus: No maxillary sinus tenderness or frontal sinus tenderness.  ?   Left Sinus: No maxillary sinus tenderness or frontal sinus tenderness.  ?   Mouth/Throat:  ?   Mouth: Mucous membranes are moist.  ?   Pharynx: Oropharynx is clear.  ?Cardiovascular:  ?   Rate and Rhythm: Normal rate and regular rhythm.  ?   Heart sounds: Normal heart sounds.  ?Pulmonary:  ?   Effort: Pulmonary effort is normal. No respiratory distress.  ?   Breath sounds: Examination of the right-upper field reveals wheezing. Examination of the left-upper field reveals wheezing. Examination of the right-lower field reveals wheezing. Examination of the left-lower field reveals wheezing. Wheezing present.  ?   Comments: Posterior and anterior lungs ?Lymphadenopathy:  ?   Cervical: No cervical adenopathy.  ?Neurological:  ?   Mental Status: She is alert.  ? ? ?BP 138/82   Pulse 68   Temp (!) 97.5 ?F (36.4 ?C)   Resp 14   Ht 5' 3.25" (1.607 m)   Wt 265 lb 8 oz (120.4 kg)   SpO2 94%   BMI 46.66 kg/m?  ?Wt Readings from Last 3 Encounters:  ?01/26/22 265 lb 8 oz (120.4  kg)  ?10/28/21 260 lb (117.9 kg)  ?07/21/21 261 lb 9.6 oz (118.7 kg)  ? ? ?Health Maintenance Due  ?Topic Date Due  ? HIV Screening  Never done  ? Hepatitis C Screening  Never done  ? MAMMOGRAM  12/15/2021  ? ? ?There are n

## 2022-01-26 NOTE — Assessment & Plan Note (Signed)
We will start patient on Medrol Dosepak.  Pending chest x-ray follow-up if no improvement ?

## 2022-01-26 NOTE — Assessment & Plan Note (Signed)
Patient has been using leftover Tussionex cough medications helped her sleep but not.  We will get the prescription for her.  Did offer Tessalon Perles but states that she did not get good effect from the medication.  Pending chest x-ray ?

## 2022-02-06 ENCOUNTER — Other Ambulatory Visit: Payer: Self-pay

## 2022-02-06 MED ORDER — DOXYCYCLINE HYCLATE 50 MG PO TABS: ORAL_TABLET | ORAL | 5 refills | Status: DC

## 2022-02-06 NOTE — Progress Notes (Signed)
Change of pharmacy per patient's request. aw 

## 2022-02-09 ENCOUNTER — Telehealth: Payer: Self-pay

## 2022-02-09 ENCOUNTER — Other Ambulatory Visit: Payer: Self-pay

## 2022-02-09 MED ORDER — DOXYCYCLINE HYCLATE 50 MG PO CAPS
50.0000 mg | ORAL_CAPSULE | Freq: Every day | ORAL | 2 refills | Status: DC
  Filled 2022-02-09: qty 30, 30d supply, fill #0
  Filled 2022-04-06: qty 30, 30d supply, fill #1

## 2022-02-09 NOTE — Telephone Encounter (Signed)
Doxycycline '50mg'$  tablet denied by insurance.  ? ?Insurance requires trial of capsules first. ? ?RX sent in, patient advised. aw ?

## 2022-02-16 ENCOUNTER — Other Ambulatory Visit: Payer: Self-pay

## 2022-02-16 DIAGNOSIS — R053 Chronic cough: Secondary | ICD-10-CM

## 2022-02-16 MED ORDER — HYDROCOD POLI-CHLORPHE POLI ER 10-8 MG/5ML PO SUER
ORAL | 0 refills | Status: DC
  Filled 2022-02-16: qty 70, 14d supply, fill #0

## 2022-02-17 ENCOUNTER — Other Ambulatory Visit: Payer: Self-pay

## 2022-02-23 ENCOUNTER — Other Ambulatory Visit: Payer: Self-pay

## 2022-02-23 ENCOUNTER — Other Ambulatory Visit: Payer: Self-pay | Admitting: Orthopedic Surgery

## 2022-02-23 DIAGNOSIS — M19049 Primary osteoarthritis, unspecified hand: Secondary | ICD-10-CM

## 2022-02-23 MED ORDER — DICLOFENAC POTASSIUM 50 MG PO TABS
ORAL_TABLET | Freq: Two times a day (BID) | ORAL | 3 refills | Status: DC
  Filled 2022-02-23: qty 90, 45d supply, fill #0
  Filled 2022-04-06: qty 90, 45d supply, fill #1
  Filled 2022-05-25: qty 90, 45d supply, fill #2
  Filled 2022-07-19: qty 90, 45d supply, fill #3

## 2022-03-23 ENCOUNTER — Other Ambulatory Visit: Payer: Self-pay

## 2022-03-23 MED ORDER — DOXYCYCLINE MONOHYDRATE 50 MG PO TABS
50.0000 mg | ORAL_TABLET | Freq: Two times a day (BID) | ORAL | 2 refills | Status: DC
Start: 2022-03-23 — End: 2022-05-25
  Filled 2022-03-23: qty 60, 30d supply, fill #0
  Filled 2022-04-27 – 2022-05-03 (×2): qty 60, 30d supply, fill #1

## 2022-03-23 NOTE — Progress Notes (Signed)
Doxycycline Tablets approved.  RX sent in. aw

## 2022-03-24 ENCOUNTER — Other Ambulatory Visit: Payer: Self-pay

## 2022-04-07 ENCOUNTER — Other Ambulatory Visit: Payer: Self-pay

## 2022-04-14 ENCOUNTER — Ambulatory Visit
Admission: RE | Admit: 2022-04-14 | Discharge: 2022-04-14 | Disposition: A | Discharge status: Home / Self Care | Payer: 59 | Source: Ambulatory Visit | Attending: Primary Care | Admitting: Primary Care

## 2022-04-14 DIAGNOSIS — Z1231 Encounter for screening mammogram for malignant neoplasm of breast: Secondary | ICD-10-CM | POA: Insufficient documentation

## 2022-04-18 ENCOUNTER — Other Ambulatory Visit: Payer: Self-pay | Admitting: Primary Care

## 2022-04-18 DIAGNOSIS — R928 Other abnormal and inconclusive findings on diagnostic imaging of breast: Secondary | ICD-10-CM

## 2022-04-18 DIAGNOSIS — N63 Unspecified lump in unspecified breast: Secondary | ICD-10-CM

## 2022-04-28 ENCOUNTER — Other Ambulatory Visit: Payer: Self-pay

## 2022-05-03 ENCOUNTER — Other Ambulatory Visit: Payer: Self-pay

## 2022-05-04 ENCOUNTER — Ambulatory Visit
Admission: RE | Admit: 2022-05-04 | Discharge: 2022-05-04 | Disposition: A | Discharge status: Home / Self Care | Payer: 59 | Source: Ambulatory Visit | Attending: Primary Care | Admitting: Primary Care

## 2022-05-04 DIAGNOSIS — R928 Other abnormal and inconclusive findings on diagnostic imaging of breast: Secondary | ICD-10-CM

## 2022-05-04 DIAGNOSIS — N63 Unspecified lump in unspecified breast: Secondary | ICD-10-CM

## 2022-05-10 ENCOUNTER — Other Ambulatory Visit: Payer: 59

## 2022-05-11 DIAGNOSIS — Z0289 Encounter for other administrative examinations: Secondary | ICD-10-CM

## 2022-05-25 ENCOUNTER — Ambulatory Visit (INDEPENDENT_AMBULATORY_CARE_PROVIDER_SITE_OTHER): Payer: 59 | Admitting: Bariatrics

## 2022-05-25 ENCOUNTER — Encounter (INDEPENDENT_AMBULATORY_CARE_PROVIDER_SITE_OTHER): Payer: Self-pay | Admitting: Bariatrics

## 2022-05-25 ENCOUNTER — Other Ambulatory Visit: Payer: Self-pay

## 2022-05-25 VITALS — BP 165/92 | HR 67 | Temp 97.6°F | Ht 63.0 in | Wt 264.0 lb

## 2022-05-25 DIAGNOSIS — G43009 Migraine without aura, not intractable, without status migrainosus: Secondary | ICD-10-CM | POA: Diagnosis not present

## 2022-05-25 DIAGNOSIS — E559 Vitamin D deficiency, unspecified: Secondary | ICD-10-CM | POA: Diagnosis not present

## 2022-05-25 DIAGNOSIS — Z6841 Body Mass Index (BMI) 40.0 and over, adult: Secondary | ICD-10-CM

## 2022-05-25 DIAGNOSIS — E7849 Other hyperlipidemia: Secondary | ICD-10-CM

## 2022-05-25 DIAGNOSIS — E669 Obesity, unspecified: Secondary | ICD-10-CM

## 2022-05-25 DIAGNOSIS — R0602 Shortness of breath: Secondary | ICD-10-CM | POA: Diagnosis not present

## 2022-05-25 DIAGNOSIS — R7303 Prediabetes: Secondary | ICD-10-CM | POA: Diagnosis not present

## 2022-05-25 DIAGNOSIS — E66813 Obesity, class 3: Secondary | ICD-10-CM

## 2022-05-25 DIAGNOSIS — R5383 Other fatigue: Secondary | ICD-10-CM

## 2022-05-25 DIAGNOSIS — Z0000 Encounter for general adult medical examination without abnormal findings: Secondary | ICD-10-CM | POA: Diagnosis not present

## 2022-05-25 DIAGNOSIS — I10 Essential (primary) hypertension: Secondary | ICD-10-CM

## 2022-05-25 DIAGNOSIS — Z1331 Encounter for screening for depression: Secondary | ICD-10-CM | POA: Diagnosis not present

## 2022-05-26 LAB — HEMOGLOBIN A1C
Est. average glucose Bld gHb Est-mCnc: 131 mg/dL
Hgb A1c MFr Bld: 6.2 % — ABNORMAL HIGH (ref 4.8–5.6)

## 2022-05-26 LAB — COMPREHENSIVE METABOLIC PANEL
ALT: 38 IU/L — ABNORMAL HIGH (ref 0–32)
AST: 36 IU/L (ref 0–40)
Albumin/Globulin Ratio: 1.7 (ref 1.2–2.2)
Albumin: 4.5 g/dL (ref 3.8–4.9)
Alkaline Phosphatase: 108 IU/L (ref 44–121)
BUN/Creatinine Ratio: 21 (ref 9–23)
BUN: 16 mg/dL (ref 6–24)
Bilirubin Total: 0.8 mg/dL (ref 0.0–1.2)
CO2: 26 mmol/L (ref 20–29)
Calcium: 9.2 mg/dL (ref 8.7–10.2)
Chloride: 97 mmol/L (ref 96–106)
Creatinine, Ser: 0.76 mg/dL (ref 0.57–1.00)
Globulin, Total: 2.6 g/dL (ref 1.5–4.5)
Glucose: 87 mg/dL (ref 70–99)
Potassium: 3.9 mmol/L (ref 3.5–5.2)
Sodium: 138 mmol/L (ref 134–144)
Total Protein: 7.1 g/dL (ref 6.0–8.5)
eGFR: 92 mL/min/{1.73_m2} (ref >59)

## 2022-05-26 LAB — VITAMIN D 25 HYDROXY (VIT D DEFICIENCY, FRACTURES): Vit D, 25-Hydroxy: 46.5 ng/mL (ref 30.0–100.0)

## 2022-05-26 LAB — TSH+T4F+T3FREE
Free T4: 1.19 ng/dL (ref 0.82–1.77)
T3, Free: 3.7 pg/mL (ref 2.0–4.4)
TSH: 2.82 u[IU]/mL (ref 0.450–4.500)

## 2022-05-26 LAB — LIPID PANEL WITH LDL/HDL RATIO
Cholesterol, Total: 151 mg/dL (ref 100–199)
HDL: 49 mg/dL (ref >39)
LDL Chol Calc (NIH): 80 mg/dL (ref 0–99)
LDL/HDL Ratio: 1.6 ratio (ref 0.0–3.2)
Triglycerides: 122 mg/dL (ref 0–149)
VLDL Cholesterol Cal: 22 mg/dL (ref 5–40)

## 2022-05-26 LAB — INSULIN, RANDOM: INSULIN: 41.4 u[IU]/mL — ABNORMAL HIGH (ref 2.6–24.9)

## 2022-05-29 ENCOUNTER — Encounter (INDEPENDENT_AMBULATORY_CARE_PROVIDER_SITE_OTHER): Payer: Self-pay | Admitting: Bariatrics

## 2022-05-31 ENCOUNTER — Encounter (INDEPENDENT_AMBULATORY_CARE_PROVIDER_SITE_OTHER): Payer: Self-pay

## 2022-06-05 ENCOUNTER — Encounter (INDEPENDENT_AMBULATORY_CARE_PROVIDER_SITE_OTHER): Payer: Self-pay | Admitting: Bariatrics

## 2022-06-05 NOTE — Progress Notes (Signed)
Chief Complaint:   OBESITY Amy Mooney (MR# [id number]) is a 56 y.o. female who presents for evaluation and treatment of obesity and related comorbidities. Current BMI is Body mass index is 46.77 kg/m. [name] has been struggling with her weight for many years and has been unsuccessful in either losing weight, maintaining weight loss, or reaching her healthy weight goal.  Alantis states that she likes to cook.  She craves bread and ice cream.  She sometimes skips breakfast.  [name] is currently in the action stage of change and ready to dedicate time achieving and maintaining a healthier weight. [name] is interested in becoming our patient and working on intensive lifestyle modifications including (but not limited to) diet and exercise for weight loss.  [name]'s habits were reviewed today and are as follows: Her family eats meals together, she thinks her family will eat healthier with her, her desired weight loss is 114 lbs, she started gaining weight during first pregnancy, her heaviest weight ever was 260 pounds, she has significant food cravings issues, she snacks frequently in the evenings, she skips meals frequently, she is frequently drinking liquids with calories, she frequently makes poor food choices, and she frequently eats larger portions than normal.  Depression Screen [name]'s Food and Mood (modified PHQ-9) score was 7.     05/25/2022    7:19 AM  Depression screen PHQ 2/9  Decreased Interest 1  Down, Depressed, Hopeless 0  PHQ - 2 Score 1  Altered sleeping 1  Tired, decreased energy 3  Change in appetite 1  Feeling bad or failure about yourself  0  Trouble concentrating 0  Moving slowly or fidgety/restless 1  Suicidal thoughts 0  PHQ-9 Score 7  Difficult doing work/chores Not difficult at all   Subjective:   1. Fatigue, unspecified type Amy Mooney admits to daytime somnolence and admits to waking up still tired. Patient has a history of symptoms of daytime fatigue,  morning fatigue, and morning headache. Amy Mooney generally gets 7 or 8 hours of sleep per night, and states that she has nightime awakenings. Snoring is present. Apneic episodes are present. Epworth Sleepiness Score is 16.   2. SOB (shortness of breath) on exertion Amy Mooney notes increasing shortness of breath with exercising and seems to be worsening over time with weight gain. She notes getting out of breath sooner with activity than she used to. This has not gotten worse recently. Amy Mooney denies shortness of breath at rest or orthopnea.  3. Essential hypertension Amy Mooney is taking hydrochlorothiazide, and her blood pressure is elevated at 165/92 today.  Rarely well controlled.  4. Migraine without aura and without status migrainosus, not intractable Amy Mooney is taking rizatriptan, and her symptoms are controlled.   5. Healthcare maintenance Obesity.  6. Prediabetes Amy Mooney is not on medications currently.  7. Vitamin D deficiency Amy Mooney is currently taking multivitamins with vitamin D3.  Assessment/Plan:   1. Fatigue, unspecified type Amy Mooney does feel that her weight is causing her energy to be lower than it should be. Fatigue may be related to obesity, depression or many other causes. Labs will be ordered, and in the meanwhile, [name] will focus on self care including making healthy food choices, increasing physical activity and focusing on stress reduction.  - EKG 12-Lead - TSH+T4F+T3Free - Lipid Panel With LDL/HDL Ratio  2. SOB (shortness of breath) on exertion Amy Mooney does feel that she gets out of breath more easily that she used to when she exercises. Amy Mooney's shortness of breath appears  to be obesity related and exercise induced. She has agreed to work on weight loss and gradually increase exercise to treat her exercise induced shortness of breath. Will continue to monitor closely.  3. Essential hypertension We will check labs today. Amy Mooney will continue her current medications and  eliminate added salt.  - Comprehensive metabolic panel  4. Migraine without aura and without status migrainosus, not intractable Amy Mooney will continue to follow-up with her PCP  5. Healthcare maintenance We will check labs today.  EKG and IC were done today and reviewed.  - EKG 12-Lead - Lipid Panel With LDL/HDL Ratio  6. Prediabetes We will check labs today, and we will follow-up at [facility] next visit.  - Hemoglobin A1c - Insulin, random  7. Vitamin D deficiency We will check labs today, and we will follow-up at [facility] next visit.  - VITAMIN D 25 Hydroxy (Vit-D Deficiency, Fractures)  8. Depression screening Mallary had a positive depression screening. Depression is commonly associated with obesity and often results in emotional eating behaviors. We will monitor this closely and work on CBT to help improve the non-hunger eating patterns. Referral to Psychology may be required if no improvement is seen as she continues in our clinic.  9. obesity, current BMI-46.9 Amy Mooney is currently in the action stage of change and her goal is to continue with weight loss efforts. I recommend Amy Mooney begin the structured treatment plan as follows:  She has agreed to the Category 2 Plan.  Meal planning was discussed.  She will not skip meals.  Exercise goals: No exercise has been prescribed at this time.   Behavioral modification strategies: increasing lean protein intake, decreasing simple carbohydrates, increasing vegetables, increasing water intake, decreasing eating out, no skipping meals, meal planning and cooking strategies, keeping healthy foods in the home, and planning for success.  She was informed of the importance of frequent follow-up visits to maximize her success with intensive lifestyle modifications for her multiple health conditions. She was informed we would discuss her lab results at her next visit unless there is a critical issue that needs to be addressed sooner. Amy Mooney  agreed to keep her next visit at the agreed upon time to discuss these results.  Objective:   Blood pressure (!) 165/92, pulse 67, temperature 97.6 F (36.4 C), height '5\' 3"'$  (1.6 m), weight 264 lb (119.7 kg), SpO2 96 %. Body mass index is 46.77 kg/m.  EKG: Normal sinus rhythm, rate 61 BPM.  Indirect Calorimeter completed today shows a VO2 of 258 and a REE of 1786.  Her calculated basal metabolic rate is 2025 thus her basal metabolic rate is better than expected.  General: Cooperative, alert, well developed, in no acute distress. HEENT: Conjunctivae and lids unremarkable. Cardiovascular: Regular rhythm.  Lungs: Normal work of breathing. Neurologic: No focal deficits.   Lab Results  Component Value Date   CREATININE 0.76 05/25/2022   BUN 16 05/25/2022   NA 138 05/25/2022   K 3.9 05/25/2022   CL 97 05/25/2022   CO2 26 05/25/2022   Lab Results  Component Value Date   ALT 38 (H) 05/25/2022   AST 36 05/25/2022   ALKPHOS 108 05/25/2022   BILITOT 0.8 05/25/2022   Lab Results  Component Value Date   HGBA1C 6.2 (H) 05/25/2022   HGBA1C 5.8 (A) 03/04/2021   HGBA1C 6.1 08/27/2020   HGBA1C 5.6 04/21/2019   HGBA1C 5.6 12/17/2018   Lab Results  Component Value Date   INSULIN 41.4 (H) 05/25/2022   INSULIN  22.8 04/21/2019   INSULIN 25.6 (H) 12/17/2018   INSULIN 29.0 (H) 08/19/2018   Lab Results  Component Value Date   TSH 2.820 05/25/2022   Lab Results  Component Value Date   CHOL 151 05/25/2022   HDL 49 05/25/2022   LDLCALC 80 05/25/2022   TRIG 122 05/25/2022   CHOLHDL 3 08/27/2020   Lab Results  Component Value Date   WBC 8.4 08/27/2020   HGB 13.0 08/27/2020   HCT 39.4 08/27/2020   MCV 79.2 08/27/2020   PLT 219.0 08/27/2020   Lab Results  Component Value Date   IRON 50 06/24/2014   TIBC 328 06/24/2014   FERRITIN 130 06/24/2014   Attestation Statements:   Reviewed by clinician on day of visit: allergies, medications, problem list, medical history, surgical  history, family history, social history, and previous encounter notes.   Wilhemena Durie, am acting as Location manager for [employer], DO.  I have reviewed the above documentation for accuracy and completeness, and I agree with the above. Jearld Lesch, DO

## 2022-06-08 ENCOUNTER — Other Ambulatory Visit: Payer: Self-pay

## 2022-06-08 ENCOUNTER — Encounter (INDEPENDENT_AMBULATORY_CARE_PROVIDER_SITE_OTHER): Payer: Self-pay | Admitting: Bariatrics

## 2022-06-08 ENCOUNTER — Ambulatory Visit (INDEPENDENT_AMBULATORY_CARE_PROVIDER_SITE_OTHER): Payer: 59 | Admitting: Bariatrics

## 2022-06-08 VITALS — BP 138/86 | HR 80 | Temp 98.1°F | Ht 63.0 in | Wt 257.0 lb

## 2022-06-08 DIAGNOSIS — E669 Obesity, unspecified: Secondary | ICD-10-CM

## 2022-06-08 DIAGNOSIS — R7303 Prediabetes: Secondary | ICD-10-CM | POA: Diagnosis not present

## 2022-06-08 DIAGNOSIS — I10 Essential (primary) hypertension: Secondary | ICD-10-CM | POA: Diagnosis not present

## 2022-06-08 DIAGNOSIS — Z6841 Body Mass Index (BMI) 40.0 and over, adult: Secondary | ICD-10-CM

## 2022-06-08 HISTORY — DX: Morbid (severe) obesity due to excess calories: E66.01

## 2022-06-08 HISTORY — DX: Body Mass Index (BMI) 40.0 and over, adult: Z684

## 2022-06-08 MED ORDER — METFORMIN HCL 500 MG PO TABS
500.0000 mg | ORAL_TABLET | Freq: Every day | ORAL | 0 refills | Status: DC
  Filled 2022-06-08: qty 30, 30d supply, fill #0

## 2022-06-19 NOTE — Progress Notes (Unsigned)
Chief Complaint:   OBESITY Amy Mooney is here to discuss her progress with her obesity treatment plan along with follow-up of her obesity related diagnoses. Amy Mooney is on the Category 2 Plan and states she is following her eating plan approximately 95% of the time. Amy Mooney states she is doing 0 minutes 0 times per week.  Today's visit was #: 2 Starting weight: 264 lbs Starting date: 05/25/2022 Today's weight: 257 lbs Today's date: 06/08/2022 Total lbs lost to date: 7 Total lbs lost since last in-office visit: 7  Interim History: Amy Mooney is down 7 lbs since her first visit.   Subjective:   1. Pre-diabetes Amy Mooney's A1c was 6.2 and insulin level 41.4.  2. Essential hypertension Amy Mooney's blood pressure is controlled.   Assessment/Plan:   1. Pre-diabetes Reviewed and discussed that she is on metformin.  Amy Mooney agreed to start metformin 500 mg every morning with breakfast, with no refills.  - metFORMIN (GLUCOPHAGE) 500 MG tablet; Take 1 tablet (500 mg total) by mouth daily with breakfast.  Dispense: 30 tablet; Refill: 0  2. Essential hypertension Amy Mooney will continue her medications as directed.   3. Obesity, current BMI 45.6 Amy Mooney is currently in the action stage of change. As such, her goal is to continue with weight loss efforts. She has agreed to the Category 2 Plan.   Meal planning and intentional eating were discussed.  Review labs with the patient from 05/25/2022, CMP, lipids, vitamin D, A1c, insulin, and thyroid panel.  Will adhere closely to the meal plan.  Exercise goals: No exercise has been prescribed at this time.  Behavioral modification strategies: increasing lean protein intake, decreasing simple carbohydrates, increasing vegetables, increasing water intake, decreasing eating out, no skipping meals, meal planning and cooking strategies, keeping healthy foods in the home, and planning for success.  Amy Mooney has agreed to follow-up with our clinic in 2 to 3 weeks with  myself, or with Amy Marble, NP or Dr. Valetta Close. She was informed of the importance of frequent follow-up visits to maximize her success with intensive lifestyle modifications for her multiple health conditions.   Objective:   Blood pressure 138/86, pulse 80, temperature 98.1 F (36.7 C), height '5\' 3"'$  (1.6 m), weight 257 lb (116.6 kg), SpO2 98 %. Body mass index is 45.53 kg/m.  General: Cooperative, alert, well developed, in no acute distress. HEENT: Conjunctivae and lids unremarkable. Cardiovascular: Regular rhythm.  Lungs: Normal work of breathing. Neurologic: No focal deficits.   Lab Results  Component Value Date   CREATININE 0.76 05/25/2022   BUN 16 05/25/2022   NA 138 05/25/2022   K 3.9 05/25/2022   CL 97 05/25/2022   CO2 26 05/25/2022   Lab Results  Component Value Date   ALT 38 (H) 05/25/2022   AST 36 05/25/2022   ALKPHOS 108 05/25/2022   BILITOT 0.8 05/25/2022   Lab Results  Component Value Date   HGBA1C 6.2 (H) 05/25/2022   HGBA1C 5.8 (A) 03/04/2021   HGBA1C 6.1 08/27/2020   HGBA1C 5.6 04/21/2019   HGBA1C 5.6 12/17/2018   Lab Results  Component Value Date   INSULIN 41.4 (H) 05/25/2022   INSULIN 22.8 04/21/2019   INSULIN 25.6 (H) 12/17/2018   INSULIN 29.0 (H) 08/19/2018   Lab Results  Component Value Date   TSH 2.820 05/25/2022   Lab Results  Component Value Date   CHOL 151 05/25/2022   HDL 49 05/25/2022   LDLCALC 80 05/25/2022   TRIG 122 05/25/2022   CHOLHDL 3 08/27/2020  Lab Results  Component Value Date   VD25OH 46.5 05/25/2022   VD25OH 50.04 08/27/2020   VD25OH 61.60 08/11/2019   Lab Results  Component Value Date   WBC 8.4 08/27/2020   HGB 13.0 08/27/2020   HCT 39.4 08/27/2020   MCV 79.2 08/27/2020   PLT 219.0 08/27/2020   Lab Results  Component Value Date   IRON 50 06/24/2014   TIBC 328 06/24/2014   FERRITIN 130 06/24/2014   Attestation Statements:   Reviewed by clinician on day of visit: allergies, medications, problem list,  medical history, surgical history, family history, social history, and previous encounter notes.   Wilhemena Durie, am acting as Location manager for [employer], DO.  I have reviewed the above documentation for accuracy and completeness, and I agree with the above. -  Jearld Lesch

## 2022-06-21 ENCOUNTER — Encounter (INDEPENDENT_AMBULATORY_CARE_PROVIDER_SITE_OTHER): Payer: Self-pay | Admitting: Bariatrics

## 2022-07-07 ENCOUNTER — Other Ambulatory Visit: Payer: Self-pay

## 2022-07-07 ENCOUNTER — Other Ambulatory Visit: Payer: Self-pay | Admitting: Dermatology

## 2022-07-09 ENCOUNTER — Other Ambulatory Visit: Payer: Self-pay | Admitting: Dermatology

## 2022-07-09 ENCOUNTER — Other Ambulatory Visit: Payer: Self-pay

## 2022-07-10 ENCOUNTER — Encounter (INDEPENDENT_AMBULATORY_CARE_PROVIDER_SITE_OTHER): Payer: Self-pay | Admitting: Adult Health

## 2022-07-10 ENCOUNTER — Ambulatory Visit (INDEPENDENT_AMBULATORY_CARE_PROVIDER_SITE_OTHER): Payer: 59 | Admitting: Adult Health

## 2022-07-10 ENCOUNTER — Other Ambulatory Visit: Payer: Self-pay

## 2022-07-10 VITALS — BP 150/84 | HR 61 | Temp 97.8°F | Ht 63.0 in | Wt 254.0 lb

## 2022-07-10 DIAGNOSIS — R7303 Prediabetes: Secondary | ICD-10-CM

## 2022-07-10 DIAGNOSIS — E669 Obesity, unspecified: Secondary | ICD-10-CM | POA: Diagnosis not present

## 2022-07-10 DIAGNOSIS — Z6841 Body Mass Index (BMI) 40.0 and over, adult: Secondary | ICD-10-CM

## 2022-07-10 MED ORDER — METFORMIN HCL 500 MG PO TABS
500.0000 mg | ORAL_TABLET | Freq: Every day | ORAL | 0 refills | Status: DC
  Filled 2022-07-10: qty 30, 30d supply, fill #0

## 2022-07-10 MED FILL — Doxycycline Monohydrate Tab 50 MG: ORAL | 30 days supply | Qty: 60 | Fill #0 | Status: AC

## 2022-07-11 ENCOUNTER — Encounter: Payer: Self-pay | Admitting: Dermatology

## 2022-07-11 ENCOUNTER — Ambulatory Visit (INDEPENDENT_AMBULATORY_CARE_PROVIDER_SITE_OTHER): Payer: 59 | Admitting: Dermatology

## 2022-07-11 DIAGNOSIS — L738 Other specified follicular disorders: Secondary | ICD-10-CM | POA: Diagnosis not present

## 2022-07-11 NOTE — Progress Notes (Signed)
check  Follow-Up Visit   Subjective  Amy Mooney is a 56 y.o. female who presents for the following: check spot (R nasal root, 23m tender when glasses hit).  The following portions of the chart were reviewed this encounter and updated as appropriate:   Tobacco  Allergies  Meds  Problems  Med Hx  Surg Hx  Fam Hx     Review of Systems:  No other skin or systemic complaints except as noted in HPI or Assessment and Plan.  Objective  Well appearing patient in no apparent distress; mood and affect are within normal limits.  A focused examination was performed including face. Relevant physical exam findings are noted in the Assessment and Plan.  R lat nasal bridge 2.087mred pap with central pore and comedone      Assessment & Plan  Sebaceous hyperplasia, inflamed and symptomatic R lat nasal bridge  Destruction of lesion - R lat nasal bridge Complexity: extensive   Destruction method: electrodesiccation and curettage   Informed consent: discussed and consent obtained   Timeout:  patient name, date of birth, surgical site, and procedure verified Procedure prep:  Patient was prepped and draped in usual sterile fashion Prep type:  Isopropyl alcohol Anesthesia: the lesion was anesthetized in a standard fashion   Anesthetic:  1% lidocaine w/ epinephrine 1-100,000 buffered w/ 8.4% NaHCO3 Curettage performed in three different directions: Yes   Electrodesiccation performed over the curetted area: Yes   Hemostasis achieved with:  pressure, aluminum chloride and electrodesiccation Outcome: patient tolerated procedure well with no complications   Post-procedure details: sterile dressing applied and wound care instructions given   Dressing type: bandage and bacitracin    Return if symptoms worsen or fail to improve.  I, SoOthelia PullingRMA, am acting as scribe for DaSarina SerMD . Documentation: I have reviewed the above documentation for accuracy and completeness, and I agree  with the above.  DaSarina SerMD

## 2022-07-11 NOTE — Patient Instructions (Addendum)
Wound Care Instructions  Cleanse wound gently with soap and water once a day then pat dry with clean gauze. Apply a thin coat of Petrolatum (petroleum jelly, "Vaseline") over the wound (unless you have an allergy to this). We recommend that you use a new, sterile tube of Vaseline. Do not pick or remove scabs. Do not remove the yellow or white "healing tissue" from the base of the wound.  Cover the wound with fresh, clean, nonstick gauze and secure with paper tape. You may use Band-Aids in place of gauze and tape if the wound is small enough, but would recommend trimming much of the tape off as there is often too much. Sometimes Band-Aids can irritate the skin.  You should call the office for your biopsy report after 1 week if you have not already been contacted.  If you experience any problems, such as abnormal amounts of bleeding, swelling, significant bruising, significant pain, or evidence of infection, please call the office immediately.  FOR ADULT SURGERY PATIENTS: If you need something for pain relief you may take 1 extra strength Tylenol (acetaminophen) AND 2 Ibuprofen (200mg each) together every 4 hours as needed for pain. (do not take these if you are allergic to them or if you have a reason you should not take them.) Typically, you may only need pain medication for 1 to 3 days.     Due to recent changes in healthcare laws, you may see results of your pathology and/or laboratory studies on MyChart before the doctors have had a chance to review them. We understand that in some cases there may be results that are confusing or concerning to you. Please understand that not all results are received at the same time and often the doctors may need to interpret multiple results in order to provide you with the best plan of care or course of treatment. Therefore, we ask that you please give us 2 business days to thoroughly review all your results before contacting the office for clarification. Should  we see a critical lab result, you will be contacted sooner.   If You Need Anything After Your Visit  If you have any questions or concerns for your doctor, please call our main line at [phone] and press option 4 to reach your doctor's medical assistant. If no one answers, please leave a voicemail as directed and we will return your call as soon as possible. Messages left after 4 pm will be answered the following business day.   You may also send us a message via MyChart. We typically respond to MyChart messages within 1-2 business days.  For prescription refills, please ask your pharmacy to contact our office. Our fax number is [phone].  If you have an urgent issue when the clinic is closed that cannot wait until the next business day, you can page your doctor at the number below.    Please note that while we do our best to be available for urgent issues outside of office hours, we are not available 24/7.   If you have an urgent issue and are unable to reach us, you may choose to seek medical care at your doctor's office, retail clinic, urgent care center, or emergency room.  If you have a medical emergency, please immediately call 911 or go to the emergency department.  Pager Numbers  - Dr. Kowalski: [phone]  - Dr. Moye: [phone]  - Dr. Stewart: [phone]  In the event of inclement weather, please call our main line at   [phone] for an update on the status of any delays or closures.  Dermatology Medication Tips: Please keep the boxes that topical medications come in in order to help keep track of the instructions about where and how to use these. Pharmacies typically print the medication instructions only on the boxes and not directly on the medication tubes.   If your medication is too expensive, please contact our office at [phone] option 4 or send us a message through MyChart.   We are unable to tell what your co-pay for medications will be in  advance as this is different depending on your insurance coverage. However, we may be able to find a substitute medication at lower cost or fill out paperwork to get insurance to cover a needed medication.   If a prior authorization is required to get your medication covered by your insurance company, please allow us 1-2 business days to complete this process.  Drug prices often vary depending on where the prescription is filled and some pharmacies may offer cheaper prices.  The website www.goodrx.com contains coupons for medications through different pharmacies. The prices here do not account for what the cost may be with help from insurance (it may be cheaper with your insurance), but the website can give you the price if you did not use any insurance.  - You can print the associated coupon and take it with your prescription to the pharmacy.  - You may also stop by our office during regular business hours and pick up a GoodRx coupon card.  - If you need your prescription sent electronically to a different pharmacy, notify our office through [facility] MyChart or by phone at [phone] option 4.     Si Usted Necesita Algo Despus de Su Visita  Tambin puede enviarnos un mensaje a travs de MyChart. Por lo general respondemos a los mensajes de MyChart en el transcurso de 1 a 2 das hbiles.  Para renovar recetas, por favor pida a su farmacia que se ponga en contacto con nuestra oficina. Nuestro nmero de fax es el [phone].  Si tiene un asunto urgente cuando la clnica est cerrada y que no puede esperar hasta el siguiente da hbil, puede llamar/localizar a su doctor(a) al nmero que aparece a continuacin.   Por favor, tenga en cuenta que aunque hacemos todo lo posible para estar disponibles para asuntos urgentes fuera del horario de oficina, no estamos disponibles las 24 horas del da, los 7 das de la semana.   Si tiene un problema urgente y no puede comunicarse con nosotros, puede  optar por buscar atencin mdica  en el consultorio de su doctor(a), en una clnica privada, en un centro de atencin urgente o en una sala de emergencias.  Si tiene una emergencia mdica, por favor llame inmediatamente al 911 o vaya a la sala de emergencias.  Nmeros de bper  - Dr. Kowalski: [phone]  - Dra. Moye: [phone]  - Dra. Stewart: [phone]  En caso de inclemencias del tiempo, por favor llame a nuestra lnea principal al [phone] para una actualizacin sobre el estado de cualquier retraso o cierre.  Consejos para la medicacin en dermatologa: Por favor, guarde las cajas en las que vienen los medicamentos de uso tpico para ayudarle a seguir las instrucciones sobre dnde y cmo usarlos. Las farmacias generalmente imprimen las instrucciones del medicamento slo en las cajas y no directamente en los tubos del medicamento.   Si su medicamento es muy caro, por favor, pngase en contacto con   nuestra oficina llamando al [phone] y presione la opcin 4 o envenos un mensaje a travs de MyChart.   No podemos decirle cul ser su copago por los medicamentos por adelantado ya que esto es diferente dependiendo de la cobertura de su seguro. Sin embargo, es posible que podamos encontrar un medicamento sustituto a menor costo o llenar un formulario para que el seguro cubra el medicamento que se considera necesario.   Si se requiere una autorizacin previa para que su compaa de seguros cubra su medicamento, por favor permtanos de 1 a 2 das hbiles para completar este proceso.  Los precios de los medicamentos varan con frecuencia dependiendo del lugar de dnde se surte la receta y alguna farmacias pueden ofrecer precios ms baratos.  El sitio web www.goodrx.com tiene cupones para medicamentos de diferentes farmacias. Los precios aqu no tienen en cuenta lo que podra costar con la ayuda del seguro (puede ser ms barato con su seguro), pero el sitio web puede darle el  precio si no utiliz ningn seguro.  - Puede imprimir el cupn correspondiente y llevarlo con su receta a la farmacia.  - Tambin puede pasar por nuestra oficina durante el horario de atencin regular y recoger una tarjeta de cupones de GoodRx.  - Si necesita que su receta se enve electrnicamente a una farmacia diferente, informe a nuestra oficina a travs de MyChart de [facility] o por telfono llamando al [phone] y presione la opcin 4.  

## 2022-07-11 NOTE — Progress Notes (Signed)
Chief Complaint:   OBESITY Amy Mooney is here to discuss her progress with her obesity treatment plan along with follow-up of her obesity related diagnoses. Jamaya is on the Category 2 Plan and states she is following her eating plan approximately 80% of the time. [name] states she is walking 20 minutes 3 times per week.  Today's visit was #: 3 Starting weight: 264 lbs Starting date: 05/25/2022 Today's weight: 254 lbs Today's date: 07/10/2022 Total lbs lost to date: 10 lbs Total lbs lost since last in-office visit: 3 lbs  Interim History:  [name] endorses polyphagia. She will snack on cottage cheese and protein bar (100 cal per bar).  Reviewed [employer] results with pt: Muscle Mass +2.4 lbs  Adipose Mass - 5 lbs   Subjective:   1. Pre-diabetes She was inconsistent with Metformin at breakfast.  Estimates 40% compliance.  When she takes Metformin she denies GI upset. Lab Results  Component Value Date   HGBA1C 6.2 (H) 05/25/2022   HGBA1C 5.8 (A) 03/04/2021   HGBA1C 6.1 08/27/2020     Assessment/Plan:   1. Pre-diabetes Refill - metFORMIN (GLUCOPHAGE) 500 MG tablet; Take 1 tablet (500 mg total) by mouth daily with lunch.  Dispense: 30 tablet; Refill: 0  2. Obesity, current BMI 45.1 [name] is currently in the action stage of change. As such, her goal is to continue with weight loss efforts. She has agreed to the Category 2 Plan.   Exercise goals:  As is.   Behavioral modification strategies: increasing lean protein intake, decreasing simple carbohydrates, meal planning and cooking strategies, keeping healthy foods in the home, and planning for success.  [name] has agreed to follow-up with our clinic in 3 weeks. She was informed of the importance of frequent follow-up visits to maximize her success with intensive lifestyle modifications for her multiple health conditions.   Objective:   Blood pressure (!) 150/84, pulse 61, temperature 97.8 F (36.6 C), height '5\' 3"'$   (1.6 m), weight 254 lb (115.2 kg), SpO2 98 %. Body mass index is 44.99 kg/m.  General: Cooperative, alert, well developed, in no acute distress. HEENT: Conjunctivae and lids unremarkable. Cardiovascular: Regular rhythm.  Lungs: Normal work of breathing. Neurologic: No focal deficits.   Lab Results  Component Value Date   CREATININE 0.76 05/25/2022   BUN 16 05/25/2022   NA 138 05/25/2022   K 3.9 05/25/2022   CL 97 05/25/2022   CO2 26 05/25/2022   Lab Results  Component Value Date   ALT 38 (H) 05/25/2022   AST 36 05/25/2022   ALKPHOS 108 05/25/2022   BILITOT 0.8 05/25/2022   Lab Results  Component Value Date   HGBA1C 6.2 (H) 05/25/2022   HGBA1C 5.8 (A) 03/04/2021   HGBA1C 6.1 08/27/2020   HGBA1C 5.6 04/21/2019   HGBA1C 5.6 12/17/2018   Lab Results  Component Value Date   INSULIN 41.4 (H) 05/25/2022   INSULIN 22.8 04/21/2019   INSULIN 25.6 (H) 12/17/2018   INSULIN 29.0 (H) 08/19/2018   Lab Results  Component Value Date   TSH 2.820 05/25/2022   Lab Results  Component Value Date   CHOL 151 05/25/2022   HDL 49 05/25/2022   LDLCALC 80 05/25/2022   TRIG 122 05/25/2022   CHOLHDL 3 08/27/2020   Lab Results  Component Value Date   VD25OH 46.5 05/25/2022   VD25OH 50.04 08/27/2020   VD25OH 61.60 08/11/2019   Lab Results  Component Value Date   WBC 8.4 08/27/2020   HGB  13.0 08/27/2020   HCT 39.4 08/27/2020   MCV 79.2 08/27/2020   PLT 219.0 08/27/2020   Lab Results  Component Value Date   IRON 50 06/24/2014   TIBC 328 06/24/2014   FERRITIN 130 06/24/2014    Attestation Statements:   Reviewed by clinician on day of visit: allergies, medications, problem list, medical history, surgical history, family history, social history, and previous encounter notes.  I, Davy Pique, RMA, am acting as Location manager for Mina Marble, NP.  I have reviewed the above documentation for accuracy and completeness, and I agree with the above. -  Lady Wisham d. Trajon Rosete, NP-C

## 2022-07-20 ENCOUNTER — Other Ambulatory Visit: Payer: Self-pay

## 2022-07-31 ENCOUNTER — Other Ambulatory Visit: Payer: Self-pay

## 2022-07-31 DIAGNOSIS — W57XXXA Bitten or stung by nonvenomous insect and other nonvenomous arthropods, initial encounter: Secondary | ICD-10-CM

## 2022-07-31 MED ORDER — CLOBETASOL PROPIONATE 0.05 % EX CREA
1.0000 | TOPICAL_CREAM | Freq: Two times a day (BID) | CUTANEOUS | 2 refills | Status: DC
  Filled 2022-07-31: qty 60, 30d supply, fill #0
  Filled 2022-09-25: qty 60, 30d supply, fill #1
  Filled 2023-05-16: qty 60, 30d supply, fill #2

## 2022-07-31 NOTE — Progress Notes (Signed)
Clobetasol cream BID PRN #60gm, 2Rf

## 2022-08-01 ENCOUNTER — Other Ambulatory Visit: Payer: Self-pay

## 2022-08-02 ENCOUNTER — Other Ambulatory Visit: Payer: Self-pay

## 2022-08-10 ENCOUNTER — Other Ambulatory Visit: Payer: Self-pay

## 2022-08-10 ENCOUNTER — Encounter (INDEPENDENT_AMBULATORY_CARE_PROVIDER_SITE_OTHER): Payer: Self-pay | Admitting: Adult Health

## 2022-08-10 ENCOUNTER — Ambulatory Visit (INDEPENDENT_AMBULATORY_CARE_PROVIDER_SITE_OTHER): Payer: 59 | Admitting: Adult Health

## 2022-08-10 VITALS — BP 136/78 | HR 64 | Temp 97.8°F | Ht 63.0 in | Wt 255.0 lb

## 2022-08-10 DIAGNOSIS — G43009 Migraine without aura, not intractable, without status migrainosus: Secondary | ICD-10-CM

## 2022-08-10 DIAGNOSIS — E669 Obesity, unspecified: Secondary | ICD-10-CM | POA: Diagnosis not present

## 2022-08-10 DIAGNOSIS — Z6841 Body Mass Index (BMI) 40.0 and over, adult: Secondary | ICD-10-CM | POA: Diagnosis not present

## 2022-08-10 DIAGNOSIS — R7303 Prediabetes: Secondary | ICD-10-CM

## 2022-08-10 MED ORDER — WEGOVY 0.25 MG/0.5ML ~~LOC~~ SOAJ
0.2500 mg | SUBCUTANEOUS | 0 refills | Status: DC
  Filled 2022-08-10: qty 2, fill #0
  Filled 2022-08-14 – 2022-11-03 (×3): qty 2, 28d supply, fill #0

## 2022-08-14 ENCOUNTER — Other Ambulatory Visit: Payer: Self-pay

## 2022-08-16 DIAGNOSIS — H35033 Hypertensive retinopathy, bilateral: Secondary | ICD-10-CM | POA: Diagnosis not present

## 2022-08-17 NOTE — Progress Notes (Signed)
Chief Complaint:   OBESITY Amy Mooney is here to discuss her progress with her obesity treatment plan along with follow-up of her obesity related diagnoses. Amy Mooney is on the Category 2 Plan and states she is following her eating plan approximately 85% of the time. Amy Mooney states she is walking 30 minutes, but  not much due to knee pain.   Today's visit was #: 4 Starting weight: 264 lbs Starting date: 05/25/2022 Today's weight: 255 lbs Today's date: 08/10/2022 Total lbs lost to date: 9 lbs Total lbs lost since last in-office visit: +1 lb  Interim History:  Ms. Amy Mooney has a history of bilateral knee replacement.   She endorses recent increase of left knee pain.   Of note:  CMA-[facility], [place].   Subjective:   1. Pre-diabetes Lab Results  Component Value Date   HGBA1C 6.2 (H) 05/25/2022   HGBA1C 5.8 (A) 03/04/2021   HGBA1C 6.1 08/27/2020    Her mother was insulin dependent diabetic.   She reports experiencing  nausea and diarrhea when she takes lunch dose of metformin.   She has been on Saxenda and tolerated it well.  No family history of MTC.  No personal history of pancreatitis.    2. Migraine without aura and without status migrainosus, not intractable She reports being migraine free >6 months.  She does not take any daily preventative prescriptions.  She has Maxalt for abortive care.   Assessment/Plan:   1. Pre-diabetes Discontinue metformin due to GI upset- N/D   2. Migraine without aura and without status migrainosus, not intractable Remain well hydrated with water.  Adequate sleep.   3. Obesity, current BMI 45.2 Start - Semaglutide-Weight Management (WEGOVY) 0.25 MG/0.5ML SOAJ; Inject 0.25 mg into the skin once a week.  Dispense: 2 mL; Refill: 0  Amy Mooney is currently in the action stage of change. As such, her goal is to continue with weight loss efforts. She has agreed to the Category 2 Plan.   Exercise goals:  As is.   Behavioral modification  strategies: increasing lean protein intake, decreasing simple carbohydrates, meal planning and cooking strategies, keeping healthy foods in the home, and planning for success.  Amy Mooney has agreed to follow-up with our clinic in 3-4 weeks. She was informed of the importance of frequent follow-up visits to maximize her success with intensive lifestyle modifications for her multiple health conditions.   Objective:   Blood pressure 136/78, pulse 64, temperature 97.8 F (36.6 C), height '5\' 3"'$  (1.6 m), weight 255 lb (115.7 kg), SpO2 98 %. Body mass index is 45.17 kg/m.  General: Cooperative, alert, well developed, in no acute distress. HEENT: Conjunctivae and lids unremarkable. Cardiovascular: Regular rhythm.  Lungs: Normal work of breathing. Neurologic: No focal deficits.   Lab Results  Component Value Date   CREATININE 0.76 05/25/2022   BUN 16 05/25/2022   NA 138 05/25/2022   K 3.9 05/25/2022   CL 97 05/25/2022   CO2 26 05/25/2022   Lab Results  Component Value Date   ALT 38 (H) 05/25/2022   AST 36 05/25/2022   ALKPHOS 108 05/25/2022   BILITOT 0.8 05/25/2022   Lab Results  Component Value Date   HGBA1C 6.2 (H) 05/25/2022   HGBA1C 5.8 (A) 03/04/2021   HGBA1C 6.1 08/27/2020   HGBA1C 5.6 04/21/2019   HGBA1C 5.6 12/17/2018   Lab Results  Component Value Date   INSULIN 41.4 (H) 05/25/2022   INSULIN 22.8 04/21/2019   INSULIN 25.6 (H) 12/17/2018   INSULIN  29.0 (H) 08/19/2018   Lab Results  Component Value Date   TSH 2.820 05/25/2022   Lab Results  Component Value Date   CHOL 151 05/25/2022   HDL 49 05/25/2022   LDLCALC 80 05/25/2022   TRIG 122 05/25/2022   CHOLHDL 3 08/27/2020   Lab Results  Component Value Date   VD25OH 46.5 05/25/2022   VD25OH 50.04 08/27/2020   VD25OH 61.60 08/11/2019   Lab Results  Component Value Date   WBC 8.4 08/27/2020   HGB 13.0 08/27/2020   HCT 39.4 08/27/2020   MCV 79.2 08/27/2020   PLT 219.0 08/27/2020   Lab Results   Component Value Date   IRON 50 06/24/2014   TIBC 328 06/24/2014   FERRITIN 130 06/24/2014   Attestation Statements:   Reviewed by clinician on day of visit: allergies, medications, problem list, medical history, surgical history, family history, social history, and previous encounter notes.  I, Davy Pique, RMA, am acting as Location manager for Mina Marble, NP.  I have reviewed the above documentation for accuracy and completeness, and I agree with the above. -  Marlette Curvin d. Rubi Tooley, NP-C

## 2022-08-21 ENCOUNTER — Encounter (INDEPENDENT_AMBULATORY_CARE_PROVIDER_SITE_OTHER): Payer: Self-pay

## 2022-08-22 ENCOUNTER — Other Ambulatory Visit: Payer: Self-pay

## 2022-08-22 ENCOUNTER — Other Ambulatory Visit: Payer: Self-pay | Admitting: Primary Care

## 2022-08-22 DIAGNOSIS — G43001 Migraine without aura, not intractable, with status migrainosus: Secondary | ICD-10-CM

## 2022-08-22 DIAGNOSIS — I10 Essential (primary) hypertension: Secondary | ICD-10-CM

## 2022-08-22 MED FILL — Doxycycline Monohydrate Tab 50 MG: ORAL | 30 days supply | Qty: 60 | Fill #1 | Status: AC

## 2022-08-23 ENCOUNTER — Other Ambulatory Visit: Payer: Self-pay

## 2022-08-23 MED ORDER — RIZATRIPTAN BENZOATE 10 MG PO TABS
ORAL_TABLET | ORAL | 0 refills | Status: DC
  Filled 2022-08-23: qty 10, 20d supply, fill #0

## 2022-08-23 MED ORDER — HYDROCHLOROTHIAZIDE 25 MG PO TABS
ORAL_TABLET | Freq: Every day | ORAL | 0 refills | Status: DC
  Filled 2022-08-23: qty 90, 90d supply, fill #0

## 2022-08-23 NOTE — Telephone Encounter (Signed)
Patient has been scheduled

## 2022-08-23 NOTE — Telephone Encounter (Signed)
Patient is due for CPE/follow up in January 2024, this will be required prior to any further refills.  Please schedule.   

## 2022-08-31 ENCOUNTER — Ambulatory Visit (INDEPENDENT_AMBULATORY_CARE_PROVIDER_SITE_OTHER): Payer: 59 | Admitting: Dermatology

## 2022-08-31 DIAGNOSIS — L820 Inflamed seborrheic keratosis: Secondary | ICD-10-CM | POA: Diagnosis not present

## 2022-08-31 NOTE — Progress Notes (Signed)
   Follow-Up Visit   Subjective  Amy Mooney is a 56 y.o. female who presents for the following: check spot (Nose, 43m no symptoms).  The following portions of the chart were reviewed this encounter and updated as appropriate:   Tobacco  Allergies  Meds  Problems  Med Hx  Surg Hx  Fam Hx     Review of Systems:  No other skin or systemic complaints except as noted in HPI or Assessment and Plan.  Objective  Well appearing patient in no apparent distress; mood and affect are within normal limits.  A focused examination was performed including nose. Relevant physical exam findings are noted in the Assessment and Plan.  L nasal tip Stuck on waxy pap with erythema        Assessment & Plan  Inflamed seborrheic keratosis L nasal tip  Plan LN2 in future, pt has family photos this weekend.   Return for schedule for LN2.  I, SOthelia Pulling RMA, am acting as scribe for DSarina Ser MD . Documentation: I have reviewed the above documentation for accuracy and completeness, and I agree with the above.  DSarina Ser MD

## 2022-08-31 NOTE — Patient Instructions (Signed)
Due to recent changes in healthcare laws, you may see results of your pathology and/or laboratory studies on MyChart before the doctors have had a chance to review them. We understand that in some cases there may be results that are confusing or concerning to you. Please understand that not all results are received at the same time and often the doctors may need to interpret multiple results in order to provide you with the best plan of care or course of treatment. Therefore, we ask that you please give us 2 business days to thoroughly review all your results before contacting the office for clarification. Should we see a critical lab result, you will be contacted sooner.   If You Need Anything After Your Visit  If you have any questions or concerns for your doctor, please call our main line at [phone] and press option 4 to reach your doctor's medical assistant. If no one answers, please leave a voicemail as directed and we will return your call as soon as possible. Messages left after 4 pm will be answered the following business day.   You may also send us a message via MyChart. We typically respond to MyChart messages within 1-2 business days.  For prescription refills, please ask your pharmacy to contact our office. Our fax number is [phone].  If you have an urgent issue when the clinic is closed that cannot wait until the next business day, you can page your doctor at the number below.    Please note that while we do our best to be available for urgent issues outside of office hours, we are not available 24/7.   If you have an urgent issue and are unable to reach us, you may choose to seek medical care at your doctor's office, retail clinic, urgent care center, or emergency room.  If you have a medical emergency, please immediately call 911 or go to the emergency department.  Pager Numbers  - Dr. Kowalski: [phone]  - Dr. Moye: [phone]  - Dr. Stewart:  [phone]  In the event of inclement weather, please call our main line at [phone] for an update on the status of any delays or closures.  Dermatology Medication Tips: Please keep the boxes that topical medications come in in order to help keep track of the instructions about where and how to use these. Pharmacies typically print the medication instructions only on the boxes and not directly on the medication tubes.   If your medication is too expensive, please contact our office at [phone] option 4 or send us a message through MyChart.   We are unable to tell what your co-pay for medications will be in advance as this is different depending on your insurance coverage. However, we may be able to find a substitute medication at lower cost or fill out paperwork to get insurance to cover a needed medication.   If a prior authorization is required to get your medication covered by your insurance company, please allow us 1-2 business days to complete this process.  Drug prices often vary depending on where the prescription is filled and some pharmacies may offer cheaper prices.  The website www.goodrx.com contains coupons for medications through different pharmacies. The prices here do not account for what the cost may be with help from insurance (it may be cheaper with your insurance), but the website can give you the price if you did not use any insurance.  - You can print the associated coupon and take it with   your prescription to the pharmacy.  - You may also stop by our office during regular business hours and pick up a GoodRx coupon card.  - If you need your prescription sent electronically to a different pharmacy, notify our office through [facility] MyChart or by phone at [phone] option 4.     Si Usted Necesita Algo Despus de Su Visita  Tambin puede enviarnos un mensaje a travs de MyChart. Por lo general respondemos a los mensajes de MyChart en el transcurso de 1 a 2  das hbiles.  Para renovar recetas, por favor pida a su farmacia que se ponga en contacto con nuestra oficina. Nuestro nmero de fax es el [phone].  Si tiene un asunto urgente cuando la clnica est cerrada y que no puede esperar hasta el siguiente da hbil, puede llamar/localizar a su doctor(a) al nmero que aparece a continuacin.   Por favor, tenga en cuenta que aunque hacemos todo lo posible para estar disponibles para asuntos urgentes fuera del horario de oficina, no estamos disponibles las 24 horas del da, los 7 das de la semana.   Si tiene un problema urgente y no puede comunicarse con nosotros, puede optar por buscar atencin mdica  en el consultorio de su doctor(a), en una clnica privada, en un centro de atencin urgente o en una sala de emergencias.  Si tiene una emergencia mdica, por favor llame inmediatamente al 911 o vaya a la sala de emergencias.  Nmeros de bper  - Dr. Kowalski: [phone]  - Dra. Moye: [phone]  - Dra. Stewart: [phone]  En caso de inclemencias del tiempo, por favor llame a nuestra lnea principal al [phone] para una actualizacin sobre el estado de cualquier retraso o cierre.  Consejos para la medicacin en dermatologa: Por favor, guarde las cajas en las que vienen los medicamentos de uso tpico para ayudarle a seguir las instrucciones sobre dnde y cmo usarlos. Las farmacias generalmente imprimen las instrucciones del medicamento slo en las cajas y no directamente en los tubos del medicamento.   Si su medicamento es muy caro, por favor, pngase en contacto con nuestra oficina llamando al [phone] y presione la opcin 4 o envenos un mensaje a travs de MyChart.   No podemos decirle cul ser su copago por los medicamentos por adelantado ya que esto es diferente dependiendo de la cobertura de su seguro. Sin embargo, es posible que podamos encontrar un medicamento sustituto a menor costo o llenar un formulario para que el  seguro cubra el medicamento que se considera necesario.   Si se requiere una autorizacin previa para que su compaa de seguros cubra su medicamento, por favor permtanos de 1 a 2 das hbiles para completar este proceso.  Los precios de los medicamentos varan con frecuencia dependiendo del lugar de dnde se surte la receta y alguna farmacias pueden ofrecer precios ms baratos.  El sitio web www.goodrx.com tiene cupones para medicamentos de diferentes farmacias. Los precios aqu no tienen en cuenta lo que podra costar con la ayuda del seguro (puede ser ms barato con su seguro), pero el sitio web puede darle el precio si no utiliz ningn seguro.  - Puede imprimir el cupn correspondiente y llevarlo con su receta a la farmacia.  - Tambin puede pasar por nuestra oficina durante el horario de atencin regular y recoger una tarjeta de cupones de GoodRx.  - Si necesita que su receta se enve electrnicamente a una farmacia diferente, informe a nuestra oficina a travs de MyChart de [facility]   o por telfono llamando al [phone] y presione la opcin 4.  

## 2022-09-04 ENCOUNTER — Ambulatory Visit (INDEPENDENT_AMBULATORY_CARE_PROVIDER_SITE_OTHER): Payer: 59 | Admitting: Internal Medicine

## 2022-09-04 ENCOUNTER — Encounter (INDEPENDENT_AMBULATORY_CARE_PROVIDER_SITE_OTHER): Payer: Self-pay | Admitting: Internal Medicine

## 2022-09-04 ENCOUNTER — Other Ambulatory Visit: Payer: Self-pay

## 2022-09-04 VITALS — BP 130/70 | HR 75 | Temp 97.8°F | Ht 63.0 in | Wt 255.0 lb

## 2022-09-04 DIAGNOSIS — R7303 Prediabetes: Secondary | ICD-10-CM | POA: Diagnosis not present

## 2022-09-04 DIAGNOSIS — E669 Obesity, unspecified: Secondary | ICD-10-CM | POA: Diagnosis not present

## 2022-09-04 DIAGNOSIS — Z6841 Body Mass Index (BMI) 40.0 and over, adult: Secondary | ICD-10-CM | POA: Diagnosis not present

## 2022-09-04 MED ORDER — METFORMIN HCL ER 500 MG PO TB24
500.0000 mg | ORAL_TABLET | Freq: Every day | ORAL | 0 refills | Status: DC
  Filled 2022-09-04: qty 30, 30d supply, fill #0

## 2022-09-09 ENCOUNTER — Encounter: Payer: Self-pay | Admitting: Dermatology

## 2022-09-20 NOTE — Progress Notes (Signed)
Chief Complaint:   OBESITY Amy Mooney is here to discuss her progress with her obesity treatment plan along with follow-up of her obesity related diagnoses. Amy Mooney is on the Category 2 Plan and states she is following her eating plan approximately 0% of the time. Amy Mooney states she is doing 0 minutes 0 times per week.  Today's visit was #: 5 Starting weight: 264 lbs Starting date: 05/25/2022 Today's weight: 255 lbs Today's date: 09/04/2022 Total lbs lost to date: 9 Total lbs lost since last in-office visit: 0  Interim History: Amy Mooney stopped metformin due to minor GI side effects.  She was prescribed Wegovy by her previous provider.  She was not able to get it so she got discouraged and returned to previous patterns.  Her dinner is difficult as she is married and has 5 grandchildren she has to feed.  Lunch has gotten boring for her.  Subjective:   1. Pre-diabetes Amy Mooney's last A1c was 6.2.  She denies polydipsia, but she has polyphagia.  I discussed labs with the patient today.  Assessment/Plan:   1. Pre-diabetes Amy Mooney agreed to restart metformin XR 500 mg with breakfast or lunch, with no refills.  We reviewed medication side effects and benefits.  - metFORMIN (GLUCOPHAGE-XR) 500 MG 24 hr tablet; Take 1 tablet (500 mg total) by mouth daily with breakfast.  Dispense: 30 tablet; Refill: 0  2. Obesity, current BMI 45.2 Amy Mooney is currently in the action stage of change. As such, her goal is to continue with weight loss efforts. She has agreed to the Category 2 Plan.   Patient is to work on gradual implication of the plan.  May have protein shake of 30 g of protein at lunch with veggies or fruit.  Exercise goals: All adults should avoid inactivity. Some physical activity is better than none, and adults who participate in any amount of physical activity gain some health benefits.  Behavioral modification strategies: increasing lean protein intake, increasing vegetables, no skipping  meals, meal planning and cooking strategies, keeping healthy foods in the home, dealing with family or coworker sabotage, avoiding temptations, and planning for success.  Amy Mooney has agreed to follow-up with our clinic in 2 weeks. She was informed of the importance of frequent follow-up visits to maximize her success with intensive lifestyle modifications for her multiple health conditions.   Objective:   Blood pressure 130/70, pulse 75, temperature 97.8 F (36.6 C), height '5\' 3"'$  (1.6 m), weight 255 lb (115.7 kg), SpO2 99 %. Body mass index is 45.17 kg/m.  General: Cooperative, alert, well developed, in no acute distress. HEENT: Conjunctivae and lids unremarkable. Cardiovascular: Regular rhythm.  Lungs: Normal work of breathing. Neurologic: No focal deficits.   Lab Results  Component Value Date   CREATININE 0.76 05/25/2022   BUN 16 05/25/2022   NA 138 05/25/2022   K 3.9 05/25/2022   CL 97 05/25/2022   CO2 26 05/25/2022   Lab Results  Component Value Date   ALT 38 (H) 05/25/2022   AST 36 05/25/2022   ALKPHOS 108 05/25/2022   BILITOT 0.8 05/25/2022   Lab Results  Component Value Date   HGBA1C 6.2 (H) 05/25/2022   HGBA1C 5.8 (A) 03/04/2021   HGBA1C 6.1 08/27/2020   HGBA1C 5.6 04/21/2019   HGBA1C 5.6 12/17/2018   Lab Results  Component Value Date   INSULIN 41.4 (H) 05/25/2022   INSULIN 22.8 04/21/2019   INSULIN 25.6 (H) 12/17/2018   INSULIN 29.0 (H) 08/19/2018   Lab Results  Component Value Date   TSH 2.820 05/25/2022   Lab Results  Component Value Date   CHOL 151 05/25/2022   HDL 49 05/25/2022   LDLCALC 80 05/25/2022   TRIG 122 05/25/2022   CHOLHDL 3 08/27/2020   Lab Results  Component Value Date   VD25OH 46.5 05/25/2022   VD25OH 50.04 08/27/2020   VD25OH 61.60 08/11/2019   Lab Results  Component Value Date   WBC 8.4 08/27/2020   HGB 13.0 08/27/2020   HCT 39.4 08/27/2020   MCV 79.2 08/27/2020   PLT 219.0 08/27/2020   Lab Results  Component Value  Date   IRON 50 06/24/2014   TIBC 328 06/24/2014   FERRITIN 130 06/24/2014   Attestation Statements:   Reviewed by clinician on day of visit: allergies, medications, problem list, medical history, surgical history, family history, social history, and previous encounter notes.  Time spent on visit including pre-visit chart review and post-visit care and charting was 20 minutes.   Wilhemena Durie, am acting as transcriptionist for Thomes Dinning, MD.  I have reviewed the above documentation for accuracy and completeness, and I agree with the above. -Thomes Dinning, MD

## 2022-09-25 MED FILL — Doxycycline Monohydrate Tab 50 MG: ORAL | 30 days supply | Qty: 60 | Fill #2 | Status: AC

## 2022-09-26 ENCOUNTER — Other Ambulatory Visit: Payer: Self-pay

## 2022-09-27 DIAGNOSIS — H35033 Hypertensive retinopathy, bilateral: Secondary | ICD-10-CM | POA: Diagnosis not present

## 2022-09-27 DIAGNOSIS — H5213 Myopia, bilateral: Secondary | ICD-10-CM | POA: Diagnosis not present

## 2022-09-28 ENCOUNTER — Ambulatory Visit (INDEPENDENT_AMBULATORY_CARE_PROVIDER_SITE_OTHER): Payer: 59 | Admitting: Internal Medicine

## 2022-09-29 ENCOUNTER — Other Ambulatory Visit: Payer: Self-pay

## 2022-10-09 ENCOUNTER — Other Ambulatory Visit: Payer: Self-pay

## 2022-10-09 ENCOUNTER — Ambulatory Visit (INDEPENDENT_AMBULATORY_CARE_PROVIDER_SITE_OTHER): Payer: 59

## 2022-10-09 ENCOUNTER — Ambulatory Visit (INDEPENDENT_AMBULATORY_CARE_PROVIDER_SITE_OTHER): Payer: 59 | Admitting: Orthopedic Surgery

## 2022-10-09 ENCOUNTER — Encounter: Payer: Self-pay | Admitting: Orthopedic Surgery

## 2022-10-09 DIAGNOSIS — Z96651 Presence of right artificial knee joint: Secondary | ICD-10-CM

## 2022-10-09 DIAGNOSIS — M19049 Primary osteoarthritis, unspecified hand: Secondary | ICD-10-CM

## 2022-10-09 DIAGNOSIS — Z96652 Presence of left artificial knee joint: Secondary | ICD-10-CM | POA: Diagnosis not present

## 2022-10-09 MED ORDER — DICLOFENAC POTASSIUM 50 MG PO TABS
50.0000 mg | ORAL_TABLET | Freq: Two times a day (BID) | ORAL | 3 refills | Status: AC
  Filled 2022-10-09 – 2022-11-03 (×3): qty 90, 45d supply, fill #0
  Filled 2022-12-21: qty 90, 45d supply, fill #1
  Filled 2023-01-31 – 2023-02-07 (×3): qty 90, 45d supply, fill #2
  Filled 2023-09-18: qty 90, 45d supply, fill #3

## 2022-10-09 NOTE — Progress Notes (Signed)
FOLLOW UP   Encounter Diagnoses  Name Primary?   S/P TKR (total knee replacement), left Yes   S/P total knee replacement, right    Hand arthritis      Chief Complaint  Patient presents with   s/p TKA RT 10/29/2018   s/p TKA LT 05/27/2019   3-year postop visit left and right total knees with Sigma fixed-bearing posterior stabilized implants  No problem at present knee is functioning well  Review of systems arthritis hands especially the right DIP joint small finger currently using diclofenac and would like a refill  She also has a nodule on the posterior aspect of the Achilles tendon on the left foot  X-rays of the knees show that both knees are stable without loosening and alignment is in acceptable position  Encounter Diagnoses  Name Primary?   S/P TKR (total knee replacement), left Yes   S/P total knee replacement, right    Hand arthritis     Meds ordered this encounter  Medications   diclofenac (CATAFLAM) 50 MG tablet    Sig: TAKE 1 TABLET BY MOUTH 2 TIMES DAILY.    Dispense:  90 tablet    Refill:  3   Recommend repeat imaging in a year

## 2022-10-12 ENCOUNTER — Ambulatory Visit: Payer: 59 | Admitting: Orthopedic Surgery

## 2022-10-17 ENCOUNTER — Other Ambulatory Visit: Payer: Self-pay

## 2022-10-17 ENCOUNTER — Encounter (INDEPENDENT_AMBULATORY_CARE_PROVIDER_SITE_OTHER): Payer: Self-pay | Admitting: Physician Assistant

## 2022-10-17 ENCOUNTER — Ambulatory Visit (INDEPENDENT_AMBULATORY_CARE_PROVIDER_SITE_OTHER): Payer: 59 | Admitting: Physician Assistant

## 2022-10-17 VITALS — BP 148/74 | HR 63 | Temp 97.9°F | Ht 63.0 in | Wt 253.0 lb

## 2022-10-17 DIAGNOSIS — Z6841 Body Mass Index (BMI) 40.0 and over, adult: Secondary | ICD-10-CM

## 2022-10-17 DIAGNOSIS — R7303 Prediabetes: Secondary | ICD-10-CM

## 2022-10-17 DIAGNOSIS — E669 Obesity, unspecified: Secondary | ICD-10-CM | POA: Diagnosis not present

## 2022-10-17 MED ORDER — METFORMIN HCL ER 500 MG PO TB24
500.0000 mg | ORAL_TABLET | Freq: Every day | ORAL | 0 refills | Status: DC
  Filled 2022-10-17 – 2022-11-03 (×2): qty 30, 30d supply, fill #0

## 2022-10-20 ENCOUNTER — Ambulatory Visit: Payer: 59 | Admitting: Orthopedic Surgery

## 2022-10-22 ENCOUNTER — Other Ambulatory Visit: Payer: Self-pay

## 2022-10-25 ENCOUNTER — Other Ambulatory Visit: Payer: Self-pay

## 2022-10-25 ENCOUNTER — Other Ambulatory Visit: Payer: Self-pay | Admitting: Dermatology

## 2022-10-25 MED ORDER — DOXYCYCLINE MONOHYDRATE 50 MG PO TABS
50.0000 mg | ORAL_TABLET | Freq: Two times a day (BID) | ORAL | 2 refills | Status: DC
  Filled 2022-10-25 – 2022-11-03 (×2): qty 60, 30d supply, fill #0
  Filled 2022-12-21: qty 60, 30d supply, fill #1
  Filled 2023-01-31 – 2023-02-07 (×3): qty 60, 30d supply, fill #2

## 2022-10-31 ENCOUNTER — Other Ambulatory Visit: Payer: Self-pay

## 2022-11-01 ENCOUNTER — Other Ambulatory Visit: Payer: Self-pay

## 2022-11-02 ENCOUNTER — Telehealth (INDEPENDENT_AMBULATORY_CARE_PROVIDER_SITE_OTHER): Payer: Self-pay | Admitting: *Deleted

## 2022-11-02 NOTE — Progress Notes (Signed)
Chief Complaint:   OBESITY Amy Mooney is here to discuss her progress with her obesity treatment plan along with follow-up of her obesity related diagnoses. Amy Mooney is on the Category 2 Plan and states she is following her eating plan approximately 80% of the time. Brittie states she is doing very little walking.  Today's visit was #: 6 Starting weight: 264 lbs Starting date: 05/25/2022 Today's weight: 253 lbs Today's date: 10/17/2022 Total lbs lost to date: 11 lbs Total lbs lost since last in-office visit: 2  Interim History: Amy Mooney has done very well with weight loss.  She has been very busy over Christmas.  She was unable to get Wegovy 0.25 mg, as it remains on backorder at her current dose.  She has been on Saxenda in the past without any significant side effects.  She works as a Technical brewer for [employer]. She is thinking she would like to resume Wegovy 0.25 mg weekly once it is back in stock.   Subjective:   1. Pre-diabetes Amy Mooney is taking Metformin 500 mg at breakfast but not consistently.  On 05/25/2022, A1c was 6.2/insulin was 41.4-not at goal.  Working on decreasing simple carbs and increasing lean protein and exercise to promote weight loss.  Assessment/Plan:   1. Pre-diabetes Continue/Refill metformin 500 mg daily for 1 month with 0 refills.  Continue prescribed nutrition plan to decrease simple carbohydrates, increase lean proteins and exercise to promote weight loss.  Encouraged her to try to remember to take metformin daily.  -Refill metFORMIN (GLUCOPHAGE-XR) 500 MG 24 hr tablet; Take 1 tablet (500 mg total) by mouth daily with breakfast.  Dispense: 30 tablet; Refill: 0  2. Obesity, current BMI 44.8 Amy Mooney is currently in the action stage of change. As such, her goal is to continue with weight loss efforts. She has agreed to the Category 2 Plan.   Amy Mooney is still on backorder-but will plan to start once obtained.  Exercise goals: As is.  Behavioral modification  strategies: increasing lean protein intake, decreasing simple carbohydrates, increasing water intake, and meal planning and cooking strategies.  Amy Mooney has agreed to follow-up with our clinic in 4 weeks. She was informed of the importance of frequent follow-up visits to maximize her success with intensive lifestyle modifications for her multiple health conditions.   Objective:   Blood pressure (!) 148/74, pulse 63, temperature 97.9 F (36.6 C), height '5\' 3"'$  (1.6 m), weight 253 lb (114.8 kg), SpO2 98 %. Body mass index is 44.82 kg/m.  General: Cooperative, alert, well developed, in no acute distress. HEENT: Conjunctivae and lids unremarkable. Cardiovascular: Regular rhythm.  Lungs: Normal work of breathing. Neurologic: No focal deficits.   Lab Results  Component Value Date   CREATININE 0.76 05/25/2022   BUN 16 05/25/2022   NA 138 05/25/2022   K 3.9 05/25/2022   CL 97 05/25/2022   CO2 26 05/25/2022   Lab Results  Component Value Date   ALT 38 (H) 05/25/2022   AST 36 05/25/2022   ALKPHOS 108 05/25/2022   BILITOT 0.8 05/25/2022   Lab Results  Component Value Date   HGBA1C 6.2 (H) 05/25/2022   HGBA1C 5.8 (A) 03/04/2021   HGBA1C 6.1 08/27/2020   HGBA1C 5.6 04/21/2019   HGBA1C 5.6 12/17/2018   Lab Results  Component Value Date   INSULIN 41.4 (H) 05/25/2022   INSULIN 22.8 04/21/2019   INSULIN 25.6 (H) 12/17/2018   INSULIN 29.0 (H) 08/19/2018   Lab Results  Component Value Date   TSH  2.820 05/25/2022   Lab Results  Component Value Date   CHOL 151 05/25/2022   HDL 49 05/25/2022   LDLCALC 80 05/25/2022   TRIG 122 05/25/2022   CHOLHDL 3 08/27/2020   Lab Results  Component Value Date   VD25OH 46.5 05/25/2022   VD25OH 50.04 08/27/2020   VD25OH 61.60 08/11/2019   Lab Results  Component Value Date   WBC 8.4 08/27/2020   HGB 13.0 08/27/2020   HCT 39.4 08/27/2020   MCV 79.2 08/27/2020   PLT 219.0 08/27/2020   Lab Results  Component Value Date   IRON 50  06/24/2014   TIBC 328 06/24/2014   FERRITIN 130 06/24/2014   Attestation Statements:   Reviewed by clinician on day of visit: allergies, medications, problem list, medical history, surgical history, family history, social history, and previous encounter notes.  I, Amy Mooney, am acting as transcriptionist for [employer], [place].  I have reviewed the above documentation for accuracy and completeness, and I agree with the above. -  Bora Bost,PA-C

## 2022-11-02 NOTE — Telephone Encounter (Signed)
Prior authorization done for patients Wegovy via cover my meds. Waiting on determination.  Amy Mooney (Key: [id number]) Rx #: [id number] 0.'25MG'$ /0.5ML auto-injectors

## 2022-11-03 ENCOUNTER — Ambulatory Visit (INDEPENDENT_AMBULATORY_CARE_PROVIDER_SITE_OTHER): Payer: 59 | Admitting: Primary Care

## 2022-11-03 ENCOUNTER — Other Ambulatory Visit: Payer: Self-pay

## 2022-11-03 ENCOUNTER — Encounter: Payer: Self-pay | Admitting: Primary Care

## 2022-11-03 VITALS — BP 130/86 | HR 55 | Temp 97.7°F | Ht 63.0 in | Wt 251.0 lb

## 2022-11-03 DIAGNOSIS — Z0000 Encounter for general adult medical examination without abnormal findings: Secondary | ICD-10-CM | POA: Diagnosis not present

## 2022-11-03 DIAGNOSIS — G4733 Obstructive sleep apnea (adult) (pediatric): Secondary | ICD-10-CM

## 2022-11-03 DIAGNOSIS — I10 Essential (primary) hypertension: Secondary | ICD-10-CM | POA: Diagnosis not present

## 2022-11-03 DIAGNOSIS — R7303 Prediabetes: Secondary | ICD-10-CM

## 2022-11-03 DIAGNOSIS — F3289 Other specified depressive episodes: Secondary | ICD-10-CM | POA: Diagnosis not present

## 2022-11-03 DIAGNOSIS — G2581 Restless legs syndrome: Secondary | ICD-10-CM | POA: Diagnosis not present

## 2022-11-03 DIAGNOSIS — M19041 Primary osteoarthritis, right hand: Secondary | ICD-10-CM | POA: Diagnosis not present

## 2022-11-03 DIAGNOSIS — G43009 Migraine without aura, not intractable, without status migrainosus: Secondary | ICD-10-CM

## 2022-11-03 DIAGNOSIS — E66813 Obesity, class 3: Secondary | ICD-10-CM

## 2022-11-03 DIAGNOSIS — Z6841 Body Mass Index (BMI) 40.0 and over, adult: Secondary | ICD-10-CM

## 2022-11-03 DIAGNOSIS — E7849 Other hyperlipidemia: Secondary | ICD-10-CM

## 2022-11-03 DIAGNOSIS — E559 Vitamin D deficiency, unspecified: Secondary | ICD-10-CM

## 2022-11-03 DIAGNOSIS — M19042 Primary osteoarthritis, left hand: Secondary | ICD-10-CM

## 2022-11-03 LAB — COMPREHENSIVE METABOLIC PANEL
ALT: 52 U/L — ABNORMAL HIGH (ref 0–35)
AST: 50 U/L — ABNORMAL HIGH (ref 0–37)
Albumin: 4.5 g/dL (ref 3.5–5.2)
Alkaline Phosphatase: 89 U/L (ref 39–117)
BUN: 14 mg/dL (ref 6–23)
CO2: 32 mEq/L (ref 19–32)
Calcium: 9.6 mg/dL (ref 8.4–10.5)
Chloride: 100 mEq/L (ref 96–112)
Creatinine, Ser: 0.76 mg/dL (ref 0.40–1.20)
GFR: 87.63 mL/min (ref >60.00)
Glucose, Bld: 92 mg/dL (ref 70–99)
Potassium: 3.5 mEq/L (ref 3.5–5.1)
Sodium: 141 mEq/L (ref 135–145)
Total Bilirubin: 0.9 mg/dL (ref 0.2–1.2)
Total Protein: 7.1 g/dL (ref 6.0–8.3)

## 2022-11-03 LAB — CBC
HCT: 42.4 % (ref 36.0–46.0)
Hemoglobin: 13.9 g/dL (ref 12.0–15.0)
MCHC: 32.8 g/dL (ref 30.0–36.0)
MCV: 81.5 fl (ref 78.0–100.0)
Platelets: 211 10*3/uL (ref 150.0–400.0)
RBC: 5.2 Mil/uL — ABNORMAL HIGH (ref 3.87–5.11)
RDW: 14.8 % (ref 11.5–15.5)
WBC: 6.5 10*3/uL (ref 4.0–10.5)

## 2022-11-03 LAB — HEMOGLOBIN A1C: Hgb A1c MFr Bld: 6.2 % (ref 4.6–6.5)

## 2022-11-03 NOTE — Assessment & Plan Note (Signed)
Controlled.  Continue HCTZ 25 mg daily. CMP pending. 

## 2022-11-03 NOTE — Assessment & Plan Note (Signed)
Reviewed vitamin D from August 2023. Controlled.

## 2022-11-03 NOTE — Assessment & Plan Note (Addendum)
No use of CPAP machine due to claustrophobia  . Continue to work on weight loss.

## 2022-11-03 NOTE — Assessment & Plan Note (Signed)
Following with [facility], reviewed notes from November 2023.  Continue Metformin XR 500 mg daily. Proceed with UQJFHL as prescribed.

## 2022-11-03 NOTE — Assessment & Plan Note (Signed)
Controlled.  Continue Mirapex 0.25 mg HS.

## 2022-11-03 NOTE — Assessment & Plan Note (Signed)
Diet controlled. Reviewed lipid panel from August 2023.

## 2022-11-03 NOTE — Assessment & Plan Note (Signed)
Following with orthopedics. Continue diclofenac 50 mg BID.

## 2022-11-03 NOTE — Assessment & Plan Note (Signed)
Repeat A1C pending.  Continue metformin XR 500 mg daily.

## 2022-11-03 NOTE — Assessment & Plan Note (Signed)
No concerns today. Continue to monitor. 

## 2022-11-03 NOTE — Assessment & Plan Note (Signed)
Shingrix due, she declines today. Mammogram UTD. Colonoscopy UTD due 2028.  Discussed the importance of a healthy diet and regular exercise in order for weight loss, and to reduce the risk of further co-morbidity.  Exam stable. Labs pending.  Follow up in 1 year for repeat physical.'

## 2022-11-03 NOTE — Patient Instructions (Signed)
Stop by the lab prior to leaving today. I will notify you of your results once received.   It was a pleasure to see you today!  

## 2022-11-03 NOTE — Progress Notes (Signed)
Subjective:    Patient ID: Amy Mooney, female    DOB: [birth year], 57 y.o.   MRN: [id number]  HPI  Amy Mooney is a very pleasant 57 y.o. female  has a past medical history of Allergy, Chronic knee pain, Class 3 severe obesity with serious comorbidity and body mass index (BMI) of 45.0 to 49.9 in adult [facility]) (06/08/2022), Depression, Dysplastic nevus (02/13/2021), Dysplastic nevus (10/06/2021), Fever blister, Grief reaction (06/07/2018), Heartburn, History of kidney stones, Hypertension, Migraines, Morbid obesity with BMI of 40.0-44.9, adult ([place]), Narcolepsy without cataplexy(347.00), Oral herpes, Osteoarthritis, Ovarian torsion (03/22/2017), Palpitations, Pre-diabetes, Primary osteoarthritis of left knee, Restless leg, S/P TKR (total knee replacement), left 05/27/2019 (07/10/2019), S/P total knee replacement, right 10/29/18, Sleep apnea, Syncope (11/18/2019), and Vitamin D deficiency. who presents today for complete physical and follow up of chronic conditions.  Immunizations: -Tetanus: Completed in 2017 -Influenza: Completed this season -Shingles: Completed Shingrix series  Diet: Fair diet.  Exercise: No regular exercise.  Eye exam: Completes annually  Dental exam: Completes semi-annually   Pap Smear: Hysterectomy  Mammogram: Completed in July 2023  Colonoscopy: Completed in 2018, due 2028  BP Readings from Last 3 Encounters:  11/03/22 130/86  10/17/22 (!) 148/74  09/04/22 130/70   Wt Readings from Last 3 Encounters:  11/03/22 251 lb (113.9 kg)  10/17/22 253 lb (114.8 kg)  09/04/22 255 lb (115.7 kg)   Body mass index is 44.46 kg/m.    Review of Systems  Constitutional:  Negative for unexpected weight change.  HENT:  Negative for rhinorrhea.   Respiratory:  Negative for cough and shortness of breath.   Cardiovascular:  Negative for chest pain.  Gastrointestinal:  Negative for constipation and diarrhea.  Genitourinary:  Negative for difficulty urinating.   Musculoskeletal:  Positive for arthralgias.  Skin:  Negative for rash.  Allergic/Immunologic: Negative for environmental allergies.  Neurological:  Negative for dizziness and headaches.  Psychiatric/Behavioral:  The patient is not nervous/anxious.          Past Medical History:  Diagnosis Date   Allergy    Chronic knee pain    arthritis   Class 3 severe obesity with serious comorbidity and body mass index (BMI) of 45.0 to 49.9 in adult [facility]) 06/08/2022   Depression    Dysplastic nevus 02/13/2021   right upper back paraspinal, mod to severe atypia    Dysplastic nevus 10/06/2021   left of midline ant base of neck, moderate   Fever blister    history of   Grief reaction 06/07/2018   Heartburn    History of kidney stones    Hypertension    Migraines    Morbid obesity with BMI of 40.0-44.9, adult ([place])    Narcolepsy without cataplexy(347.00)    Oral herpes    Osteoarthritis    Ovarian torsion 03/22/2017   Palpitations    Pre-diabetes    Primary osteoarthritis of left knee    Restless leg    S/P TKR (total knee replacement), left 05/27/2019 07/10/2019   S/P total knee replacement, right 10/29/18    Sleep apnea    per pt not treated; not able to use CPAP due to claustrophobia   Syncope 11/18/2019   Vitamin D deficiency     Social History   Socioeconomic History   Marital status: Married    Spouse name: Anara Cowman   Number of children: 2   Years of education: College   Highest education level: Not on file  Occupational History  Occupation: CMA    Employer: [employer]  Tobacco Use   Smoking status: Former    Packs/day: 0.50    Years: 4.00    Total pack years: 2.00    Types: Cigarettes    Quit date: 10/23/1986    Years since quitting: 36.0   Smokeless tobacco: Never  Vaping Use   Vaping Use: Never used  Substance and Sexual Activity   Alcohol use: No   Drug use: No   Sexual activity: Yes    Birth control/protection: None  Other Topics Concern    Not on file  Social History Narrative   [name])   Patient has two adult children.   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   Patient has an college education.   Patient is right-handed.   Patient drinks very little caffeine.   Enjoys spending time with family.    Social Determinants of Health   Financial Resource Strain: Not on file  Food Insecurity: Not on file  Transportation Needs: Not on file  Physical Activity: Not on file  Stress: Not on file  Social Connections: Not on file  Intimate Partner Violence: Not on file    Past Surgical History:  Procedure Laterality Date   ABDOMINAL HYSTERECTOMY  2010   artroscopic meniscus repair Bilateral    cervix removed, bladder tack     2015   CESAREAN SECTION     COLONOSCOPY WITH PROPOFOL N/A 06/19/2017   Procedure: COLONOSCOPY WITH PROPOFOL;  Surgeon: [name], MD;  Location: [facility] ENDOSCOPY;  Service: Endoscopy;  Laterality: N/A;   LAPAROSCOPIC SALPINGO OOPHERECTOMY Right 03/22/2017   Procedure: LAPAROSCOPIC SALPINGO OOPHORECTOMY;  Surgeon: Ward, Honor Loh, MD;  Location: [facility] ORS;  Service: Gynecology;  Laterality: Right;   TOTAL KNEE ARTHROPLASTY Right 10/29/2018   Procedure: TOTAL KNEE ARTHROPLASTY;  Surgeon: Carole Civil, MD;  Location: [facility];  Service: Orthopedics;  Laterality: Right;   TOTAL KNEE ARTHROPLASTY Left 05/27/2019   Procedure: TOTAL KNEE ARTHROPLASTY;  Surgeon: Carole Civil, MD;  Location: [facility];  Service: Orthopedics;  Laterality: Left;    Family History  Problem Relation Age of Onset   Breast cancer Mother 62   Diabetes Mother    Depression Mother    Anxiety disorder Mother    Obesity Mother    Breast cancer Paternal Grandmother     Allergies  Allergen Reactions   Armodafinil Hives and Other (See Comments)   Penicillins Other (See Comments) and Rash    Has patient had a PCN reaction causing immediate rash, facial/tongue/throat  swelling, SOB or lightheadedness with hypotension: Unknown  Has patient had a PCN reaction causing severe rash involving mucus membranes or skin necrosis: Unknown  Has patient had a PCN reaction that required hospitalization: Unknown  Has patient had a PCN reaction occurring within the last 10 years: Unknown  If all of the above answers are "NO", then may proceed with Cephalosporin use.   Vancomycin Hives   Lyrica [Pregabalin] Hives   Bupropion Other (See Comments)    Causes migraines    Current Outpatient Medications on File Prior to Visit  Medication Sig Dispense Refill   Cholecalciferol (VITAMIN D3) 5000 units CAPS Take 5,000 Units by mouth at bedtime.      clobetasol cream (TEMOVATE) 7.82 % Apply 1 Application topically 2 (two) times daily. Avoid applying to face, groin, and axilla. 60 g 2   Desoximetasone 0.05 % GEL Apply 1 application topically daily.  60 g 1   diclofenac (CATAFLAM) 50 MG tablet Take 1 tablet (50 mg total) by mouth 2 (two) times daily. 90 tablet 3   doxycycline (ADOXA) 50 MG tablet Take 1 tablet (50 mg total) by mouth 2 (two) times daily. 60 tablet 2   hydrochlorothiazide (HYDRODIURIL) 25 MG tablet TAKE 1 TABLET BY MOUTH DAILY FOR BLOOD PRESSURE. 90 tablet 0   loratadine (CLARITIN) 10 MG tablet Take 10 mg by mouth daily as needed for allergies.     metFORMIN (GLUCOPHAGE-XR) 500 MG 24 hr tablet Take 1 tablet (500 mg total) by mouth daily with breakfast. 30 tablet 0   Multiple Vitamins-Minerals (HAIR SKIN AND NAILS FORMULA PO) Take 1 tablet by mouth daily.     Multiple Vitamins-Minerals (MULTIVITAMIN WITH MINERALS) tablet Take 1 tablet by mouth daily.     pramipexole (MIRAPEX) 0.25 MG tablet Take 1 tablet (0.25 mg total) by mouth at bedtime. For restless legs. 90 tablet 3   rizatriptan (MAXALT) 10 MG tablet Take 1 tablet by mouth at migraine onset.  May repeat in 2 hours if needed.  Do not exceed 2 tablets in 24 hours 10 tablet 0   Calcium Carb-Cholecalciferol  (CALCIUM + D3 PO) Take 1 tablet by mouth at bedtime. (Patient not taking: Reported on 11/03/2022)     Semaglutide-Weight Management (WEGOVY) 0.25 MG/0.5ML SOAJ Inject 0.25 mg into the skin once a week. (Patient not taking: Reported on 11/03/2022) 2 mL 0   No current facility-administered medications on file prior to visit.    BP 130/86   Pulse (!) 55   Temp 97.7 F (36.5 C) (Temporal)   Ht '5\' 3"'$  (1.6 m)   Wt 251 lb (113.9 kg)   SpO2 98%   BMI 44.46 kg/m  Objective:   Physical Exam HENT:     Right Ear: Tympanic membrane and ear canal normal.     Left Ear: Tympanic membrane and ear canal normal.     Nose: Nose normal.  Eyes:     Conjunctiva/sclera: Conjunctivae normal.     Pupils: Pupils are equal, round, and reactive to light.  Neck:     Thyroid: No thyromegaly.  Cardiovascular:     Rate and Rhythm: Normal rate and regular rhythm.     Heart sounds: No murmur heard. Pulmonary:     Effort: Pulmonary effort is normal.     Breath sounds: Normal breath sounds. No rales.  Abdominal:     General: Bowel sounds are normal.     Palpations: Abdomen is soft.     Tenderness: There is no abdominal tenderness.  Musculoskeletal:        General: Normal range of motion.     Cervical back: Neck supple.  Lymphadenopathy:     Cervical: No cervical adenopathy.  Skin:    General: Skin is warm and dry.     Findings: No rash.  Neurological:     Mental Status: She is alert and oriented to person, place, and time.     Cranial Nerves: No cranial nerve deficit.     Deep Tendon Reflexes: Reflexes are normal and symmetric.  Psychiatric:        Mood and Affect: Mood normal.           Assessment & Plan:  Preventative health care Assessment & Plan: Shingrix due, she declines today. Mammogram UTD. Colonoscopy UTD due 2028.  Discussed the importance of a healthy diet and regular exercise in order for weight loss, and to reduce the risk  of further co-morbidity.  Exam stable. Labs  pending.  Follow up in 1 year for repeat physical.'   Essential hypertension Assessment & Plan: Controlled.  Continue HCTZ 25 mg daily. CMP pending.  Orders: -     Comprehensive metabolic panel -     CBC  Migraine without aura and without status migrainosus, not intractable Assessment & Plan: Infrequent. Controlled.  Continue rizatriptan 10 mg PRN.    Obstructive sleep apnea syndrome Assessment & Plan: No use of CPAP machine due to claustrophobia  . Continue to work on weight loss.    Primary osteoarthritis of both hands Assessment & Plan: Following with orthopedics. Continue diclofenac 50 mg BID.    Class 3 severe obesity due to excess calories with serious comorbidity and body mass index (BMI) of 40.0 to 44.9 in adult [facility]) Assessment & Plan: Following with [facility], reviewed notes from November 2023.  Continue Metformin XR 500 mg daily. Proceed with [facility] as prescribed.   Other depression Assessment & Plan: No concerns today. Continue to monitor.    Prediabetes Assessment & Plan: Repeat A1C pending.  Continue metformin XR 500 mg daily.  Orders: -     Hemoglobin A1c  Other hyperlipidemia Assessment & Plan: Diet controlled. Reviewed lipid panel from August 2023.   Restless leg Assessment & Plan: Controlled.  Continue Mirapex 0.25 mg HS.   Vitamin D deficiency Assessment & Plan: Reviewed vitamin D from August 2023. Controlled.         [name], NP

## 2022-11-03 NOTE — Assessment & Plan Note (Signed)
Infrequent. Controlled.  Continue rizatriptan 10 mg PRN.

## 2022-11-06 ENCOUNTER — Other Ambulatory Visit: Payer: Self-pay

## 2022-11-07 ENCOUNTER — Other Ambulatory Visit: Payer: Self-pay

## 2022-11-07 ENCOUNTER — Encounter: Payer: Self-pay | Admitting: Pharmacist

## 2022-11-08 NOTE — Telephone Encounter (Signed)
Prior authorization approved for patients wegovy.  11/04/2022 to 05/26/2023

## 2022-11-10 ENCOUNTER — Other Ambulatory Visit: Payer: Self-pay

## 2022-11-16 ENCOUNTER — Other Ambulatory Visit: Payer: Self-pay | Admitting: Primary Care

## 2022-11-16 ENCOUNTER — Other Ambulatory Visit: Payer: Self-pay

## 2022-11-16 DIAGNOSIS — I10 Essential (primary) hypertension: Secondary | ICD-10-CM

## 2022-11-16 DIAGNOSIS — G2581 Restless legs syndrome: Secondary | ICD-10-CM

## 2022-11-16 MED ORDER — HYDROCHLOROTHIAZIDE 25 MG PO TABS
25.0000 mg | ORAL_TABLET | Freq: Every day | ORAL | 3 refills | Status: DC
  Filled 2022-11-16: qty 90, 90d supply, fill #0
  Filled 2023-03-21: qty 90, 90d supply, fill #1
  Filled 2023-06-21: qty 90, 90d supply, fill #2
  Filled 2023-09-18: qty 90, 90d supply, fill #3

## 2022-11-16 MED ORDER — PRAMIPEXOLE DIHYDROCHLORIDE 0.25 MG PO TABS
0.2500 mg | ORAL_TABLET | Freq: Every day | ORAL | 3 refills | Status: DC
  Filled 2022-11-16: qty 90, 90d supply, fill #0
  Filled 2023-03-21: qty 90, 90d supply, fill #1
  Filled 2023-06-21: qty 90, 90d supply, fill #2
  Filled 2023-09-18: qty 90, 90d supply, fill #3

## 2022-11-20 ENCOUNTER — Telehealth (INDEPENDENT_AMBULATORY_CARE_PROVIDER_SITE_OTHER): Payer: Self-pay

## 2022-11-20 ENCOUNTER — Encounter (INDEPENDENT_AMBULATORY_CARE_PROVIDER_SITE_OTHER): Payer: Self-pay | Admitting: Physician Assistant

## 2022-11-20 ENCOUNTER — Other Ambulatory Visit: Payer: Self-pay

## 2022-11-20 ENCOUNTER — Ambulatory Visit (INDEPENDENT_AMBULATORY_CARE_PROVIDER_SITE_OTHER): Payer: 59 | Admitting: Physician Assistant

## 2022-11-20 VITALS — BP 125/76 | HR 63 | Temp 97.7°F | Ht 63.0 in | Wt 248.0 lb

## 2022-11-20 DIAGNOSIS — E669 Obesity, unspecified: Secondary | ICD-10-CM | POA: Diagnosis not present

## 2022-11-20 DIAGNOSIS — Z6841 Body Mass Index (BMI) 40.0 and over, adult: Secondary | ICD-10-CM

## 2022-11-20 DIAGNOSIS — R7303 Prediabetes: Secondary | ICD-10-CM

## 2022-11-20 MED ORDER — TIRZEPATIDE-WEIGHT MANAGEMENT 2.5 MG/0.5ML ~~LOC~~ SOAJ
2.5000 mg | SUBCUTANEOUS | 0 refills | Status: DC
  Filled 2022-11-20 – 2022-11-21 (×2): qty 2, 28d supply, fill #0

## 2022-11-20 MED ORDER — METFORMIN HCL ER 500 MG PO TB24
500.0000 mg | ORAL_TABLET | Freq: Every day | ORAL | 0 refills | Status: DC
  Filled 2022-11-20 – 2022-12-21 (×2): qty 30, 30d supply, fill #0

## 2022-11-20 NOTE — Telephone Encounter (Addendum)
PA submitted for Zepbound, waiting for a determination. 11/20/2022  PA approved for Zepbound from 11/20/22 through 05/19/23

## 2022-11-21 ENCOUNTER — Other Ambulatory Visit: Payer: Self-pay

## 2022-11-27 ENCOUNTER — Other Ambulatory Visit: Payer: Self-pay

## 2022-11-28 NOTE — Progress Notes (Signed)
Chief Complaint:   OBESITY Amy Mooney is here to discuss her progress with her obesity treatment plan along with follow-up of her obesity related diagnoses. Dale is on the Category 2 Plan and states she is following her eating plan approximately 70% of the time. Klaudia states she is doing some walking not consistent.  Today's visit was #: 7 Starting weight: 264 lbs Starting date: 05/25/2022 Today's weight: 248 lbs Today's date: 11/20/2022 Total lbs lost to date: 16 lbs Total lbs lost since last in-office visit: 5  Interim History: Amy Mooney has done well with weight loss. She has started [facility] 2.5 mg weekly and has taken 2 doses. Hunger is well-controlled.  She is not skipping meals. 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 She is concerned that the next dose of Mancel Parsons is currently not available due to a national drug shortage.  She is interested in switching to Zepbound . She is doing some walking but not consistently and we discussed doing some gentle strengthening exercises 3 times a week for 10 minutes. Subjective:   1. Pre-diabetes Amy Mooney is on Metformin 500 mg daily and consistently taking.  A1c 6.2, Insulin 41.4.-Not at goals.  Working on decreasing simple carbs, increasing lean protein and, exercise to promote weight loss, improve glycemic control and prevent development to type 2 diabetes.  On Wegovy loading dose times 2 weeks- 0.5 mg not available and we discussed switching to Zepbound and she is agreeable to switch to Zepbound.  Patient denies a personal or family history of pancreatitis, medullary thyroid carcinoma or multiple endocrine neoplasia type II.   Assessment/Plan:   1. Pre-diabetes Continue/Refill Metformin 500 mg by mouth daily for 1 month with 0 refills.  -Refill metFORMIN (GLUCOPHAGE-XR) 500 MG 24 hr tablet; Take 1 tablet (500 mg total) by mouth daily with breakfast.  Dispense: 30 tablet; Refill: 0  Continue to work on  decreasing simple carbohydrates, increasing lean protein and exercise to promote weight loss, improve glycemic control and prevent development of type 2 diabetes. 2. Obesity, current BMI 44.1 Will start Zepbound 2.5 mg SQ once a week for 1 month with 0 refills.  -Start tirzepatide (ZEPBOUND) 2.5 MG/0.5ML Pen; Inject 2.5 mg into the skin once a week.  Dispense: 2 mL; Refill: 0  Amy Mooney is currently in the action stage of change. As such, her goal is to continue with weight loss efforts. She has agreed to the Category 2 Plan.   Exercise goals: As is.  Adding gentle strengthening 3 times a week for 10 minutes.  Behavioral modification strategies: increasing lean protein intake, decreasing simple carbohydrates, and no skipping meals.  Amy Mooney has agreed to follow-up with our clinic in 4 weeks. She was informed of the importance of frequent follow-up visits to maximize her success with intensive lifestyle modifications for her multiple health conditions.   Objective:   Blood pressure 125/76, pulse 63, temperature 97.7 F (36.5 C), height 5' 3"$  (1.6 m), weight 248 lb (112.5 kg), SpO2 98 %. Body mass index is 43.93 kg/m.  General: Cooperative, alert, well developed, in no acute distress. HEENT: Conjunctivae and lids unremarkable. Cardiovascular: Regular rhythm.  Lungs: Normal work of breathing. Neurologic: No focal deficits.   Lab Results  Component Value Date   CREATININE 0.76 11/03/2022   BUN 14 11/03/2022   NA 141 11/03/2022   K 3.5 11/03/2022   CL 100 11/03/2022   CO2 32 11/03/2022   Lab Results  Component Value Date  ALT 52 (H) 11/03/2022   AST 50 (H) 11/03/2022   ALKPHOS 89 11/03/2022   BILITOT 0.9 11/03/2022   Lab Results  Component Value Date   HGBA1C 6.2 11/03/2022   HGBA1C 6.2 (H) 05/25/2022   HGBA1C 5.8 (A) 03/04/2021   HGBA1C 6.1 08/27/2020   HGBA1C 5.6 04/21/2019   Lab Results  Component Value Date   INSULIN 41.4 (H) 05/25/2022   INSULIN 22.8 04/21/2019    INSULIN 25.6 (H) 12/17/2018   INSULIN 29.0 (H) 08/19/2018   Lab Results  Component Value Date   TSH 2.820 05/25/2022   Lab Results  Component Value Date   CHOL 151 05/25/2022   HDL 49 05/25/2022   LDLCALC 80 05/25/2022   TRIG 122 05/25/2022   CHOLHDL 3 08/27/2020   Lab Results  Component Value Date   VD25OH 46.5 05/25/2022   VD25OH 50.04 08/27/2020   VD25OH 61.60 08/11/2019   Lab Results  Component Value Date   WBC 6.5 11/03/2022   HGB 13.9 11/03/2022   HCT 42.4 11/03/2022   MCV 81.5 11/03/2022   PLT 211.0 11/03/2022   Lab Results  Component Value Date   IRON 50 06/24/2014   TIBC 328 06/24/2014   FERRITIN 130 06/24/2014   Attestation Statements:   Reviewed by clinician on day of visit: allergies, medications, problem list, medical history, surgical history, family history, social history, and previous encounter notes.  I, Brendell Tyus, am acting as transcriptionist for [employer], [employer].  I have reviewed the above documentation for accuracy and completeness, and I agree with the above. -  Annalisia Ingber,PA-C

## 2022-12-21 ENCOUNTER — Encounter: Payer: Self-pay | Admitting: Radiology

## 2022-12-21 ENCOUNTER — Encounter (INDEPENDENT_AMBULATORY_CARE_PROVIDER_SITE_OTHER): Payer: Self-pay | Admitting: Physician Assistant

## 2022-12-21 ENCOUNTER — Other Ambulatory Visit: Payer: Self-pay

## 2022-12-21 ENCOUNTER — Ambulatory Visit (INDEPENDENT_AMBULATORY_CARE_PROVIDER_SITE_OTHER): Payer: 59 | Admitting: Physician Assistant

## 2022-12-21 VITALS — BP 148/86 | HR 63 | Temp 97.7°F | Ht 63.0 in | Wt 249.8 lb

## 2022-12-21 DIAGNOSIS — R7989 Other specified abnormal findings of blood chemistry: Secondary | ICD-10-CM | POA: Diagnosis not present

## 2022-12-21 DIAGNOSIS — I10 Essential (primary) hypertension: Secondary | ICD-10-CM

## 2022-12-21 DIAGNOSIS — Z6841 Body Mass Index (BMI) 40.0 and over, adult: Secondary | ICD-10-CM | POA: Diagnosis not present

## 2022-12-21 DIAGNOSIS — R7303 Prediabetes: Secondary | ICD-10-CM | POA: Diagnosis not present

## 2022-12-21 DIAGNOSIS — E669 Obesity, unspecified: Secondary | ICD-10-CM

## 2022-12-21 MED ORDER — TIRZEPATIDE-WEIGHT MANAGEMENT 5 MG/0.5ML ~~LOC~~ SOAJ
5.0000 mg | SUBCUTANEOUS | 0 refills | Status: DC
  Filled 2022-12-21 (×2): qty 2, 28d supply, fill #0

## 2022-12-21 NOTE — Progress Notes (Addendum)
Chief Complaint:   OBESITY Amy Mooney is here to discuss her progress with her obesity treatment plan along with follow-up of her obesity related diagnoses. Amy Mooney is on the Category 2 Plan and states she is following her eating plan approximately 75% of the time. Amy Mooney states she is walking 7500 steps  5 times per week.  Today's visit was #: 8 Starting weight: 264 lbs Starting date: 05/25/2022 Today's weight: 249 lbs Today's date: 02/05/2023 Total lbs lost to date: 15 lbs Total lbs lost since last in-office visit: +1 lb  Interim History: Amy Mooney has done well with weight loss overall. Hunger is not always controlled. She is not skipping meals.  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   Pharmacotherapy: On Zepbound 2.5 mg. No side effects.   Subjective:   1. Pre-diabetes A1 C 6.2 - Not at goal. Working on nutrition plan to decrease simple carbohydrates, increase lean proteins and exercise to promote weight loss, improve glycemic control and prevent progression to Type 2 diabetes.  On metformin 500 mg daily without side effects. On Zepbound 2.5 mg weekly and plan to increase.   2. Elevated liver function tests AST/ALT 50/52 on labs 11/03/22. Likely due to fatty liver. Will continue on nutrition plan to promote weight loss.   3. Obesity (HCC)- Start BMI 46.77 4. Obesity, current BMI 44.2  Assessment/Plan:   1. Pre-diabetes Continue metformin 500 mg once daily. Could increase to twice daily if unable to continue tirzepatide longer term. Continue Working on nutrition plan to decrease simple carbohydrates, increase lean proteins and exercise to promote weight loss, improve glycemic control and prevent progression to Type 2 diabetes.    2. Elevated liver function tests Continue nutrition plan to promote weight loss and improve liver function. If not improved on follow up labs in 2-3 months, may warrant further evaluation .   3. Obesity (HCC)-  Start BMI 46.77 Increase tirzepatide to 5 mg weekly.  - tirzepatide (ZEPBOUND) 5 MG/0.5ML Pen; Inject 5 mg into the skin once a week.  Dispense: 2 mL; Refill: 0 4. Obesity, current BMI 44.2 Amy Mooney is currently in the action stage of change. As such, her goal is to continue with weight loss efforts. She has agreed to the Category 2 Plan.   Exercise goals: All adults should avoid inactivity. Some physical activity is better than none, and adults who participate in any amount of physical activity gain some health benefits. Continue walking as is.   Behavioral modification strategies: increasing lean protein intake, decreasing simple carbohydrates, increasing water intake, no skipping meals, and planning for success.  Amy Mooney has agreed to follow-up with our clinic in 3 weeks. She was informed of the importance of frequent follow-up visits to maximize her success with intensive lifestyle modifications for her multiple health conditions.     Objective:   Blood pressure (!) 148/86, pulse 63, temperature 97.7 F (36.5 C), height 5\' 3"  (1.6 m), weight 249 lb 12.8 oz (113.3 kg), SpO2 99 %. Body mass index is 44.25 kg/m.  General: Cooperative, alert, well developed, in no acute distress. HEENT: Conjunctivae and lids unremarkable. Cardiovascular: Regular rhythm.  Lungs: Normal work of breathing. Neurologic: No focal deficits.   Lab Results  Component Value Date   CREATININE 0.76 11/03/2022   BUN 14 11/03/2022   NA 141 11/03/2022   K 3.5 11/03/2022   CL 100 11/03/2022   CO2 32 11/03/2022   Lab Results  Component Value  Date   ALT 52 (H) 11/03/2022   AST 50 (H) 11/03/2022   ALKPHOS 89 11/03/2022   BILITOT 0.9 11/03/2022   Lab Results  Component Value Date   HGBA1C 6.2 11/03/2022   HGBA1C 6.2 (H) 05/25/2022   HGBA1C 5.8 (A) 03/04/2021   HGBA1C 6.1 08/27/2020   HGBA1C 5.6 04/21/2019   Lab Results  Component Value Date   INSULIN 41.4 (H) 05/25/2022   INSULIN 22.8 04/21/2019    INSULIN 25.6 (H) 12/17/2018   INSULIN 29.0 (H) 08/19/2018   Lab Results  Component Value Date   TSH 2.820 05/25/2022   Lab Results  Component Value Date   CHOL 151 05/25/2022   HDL 49 05/25/2022   LDLCALC 80 05/25/2022   TRIG 122 05/25/2022   CHOLHDL 3 08/27/2020   Lab Results  Component Value Date   VD25OH 46.5 05/25/2022   VD25OH 50.04 08/27/2020   VD25OH 61.60 08/11/2019   Lab Results  Component Value Date   WBC 6.5 11/03/2022   HGB 13.9 11/03/2022   HCT 42.4 11/03/2022   MCV 81.5 11/03/2022   PLT 211.0 11/03/2022   Lab Results  Component Value Date   IRON 50 06/24/2014   TIBC 328 06/24/2014   FERRITIN 130 06/24/2014    Obesity Behavioral Intervention:   Approximately 15 minutes were spent on the discussion below.  ASK: We discussed the diagnosis of obesity with Amy Mooney today and Amy Mooney agreed to give [name] permission to discuss obesity behavioral modification therapy today.  ASSESS: Amy Mooney has the diagnosis of obesity and her BMI today is 44.2. Amy Mooney is in the action stage of change.   ADVISE: Amy Mooney was educated on the multiple health risks of obesity as well as the benefit of weight loss to improve her health. She was advised of the need for long term treatment and the importance of lifestyle modifications to improve her current health and to decrease her risk of future health problems.  AGREE: Multiple dietary modification options and treatment options were discussed and Amy Mooney agreed to follow the recommendations documented in the above note.  ARRANGE: Amy Mooney was educated on the importance of frequent visits to treat obesity as outlined per CMS and USPSTF guidelines and agreed to schedule her next follow up appointment today.  Attestation Statements:   Reviewed by clinician on day of visit: allergies, medications, problem list, medical history, surgical history, family history, social history, and previous encounter notes.  Tyjae Shvartsman,PA-C

## 2022-12-22 ENCOUNTER — Other Ambulatory Visit: Payer: Self-pay

## 2023-01-10 ENCOUNTER — Other Ambulatory Visit: Payer: Self-pay

## 2023-01-10 ENCOUNTER — Encounter (INDEPENDENT_AMBULATORY_CARE_PROVIDER_SITE_OTHER): Payer: Self-pay | Admitting: Physician Assistant

## 2023-01-10 ENCOUNTER — Ambulatory Visit (INDEPENDENT_AMBULATORY_CARE_PROVIDER_SITE_OTHER): Payer: 59 | Admitting: Physician Assistant

## 2023-01-10 VITALS — BP 130/89 | HR 66 | Temp 97.9°F | Ht 63.0 in | Wt 248.0 lb

## 2023-01-10 DIAGNOSIS — R7303 Prediabetes: Secondary | ICD-10-CM | POA: Insufficient documentation

## 2023-01-10 DIAGNOSIS — Z6841 Body Mass Index (BMI) 40.0 and over, adult: Secondary | ICD-10-CM | POA: Diagnosis not present

## 2023-01-10 DIAGNOSIS — R7989 Other specified abnormal findings of blood chemistry: Secondary | ICD-10-CM

## 2023-01-10 DIAGNOSIS — E669 Obesity, unspecified: Secondary | ICD-10-CM | POA: Diagnosis not present

## 2023-01-10 MED ORDER — METFORMIN HCL ER 500 MG PO TB24
500.0000 mg | ORAL_TABLET | Freq: Every day | ORAL | 0 refills | Status: DC
  Filled 2023-01-10 – 2023-01-12 (×2): qty 30, 30d supply, fill #0

## 2023-01-10 MED ORDER — TIRZEPATIDE-WEIGHT MANAGEMENT 7.5 MG/0.5ML ~~LOC~~ SOAJ
7.5000 mg | SUBCUTANEOUS | 0 refills | Status: DC
  Filled 2023-01-10 – 2023-01-31 (×3): qty 2, 28d supply, fill #0

## 2023-01-10 NOTE — Assessment & Plan Note (Signed)
Prediabetes Last A1c was 6.2  Medication(s): Zepbound 5.0 mg SQ weekly Lab Results  Component Value Date   HGBA1C 6.2 11/03/2022   HGBA1C 6.2 (H) 05/25/2022   HGBA1C 5.8 (A) 03/04/2021   HGBA1C 6.1 08/27/2020   HGBA1C 5.6 04/21/2019   Lab Results  Component Value Date   INSULIN 41.4 (H) 05/25/2022   INSULIN 22.8 04/21/2019   INSULIN 25.6 (H) 12/17/2018   INSULIN 29.0 (H) 08/19/2018    Plan: Continue and increase dose Zepbound to 7.5 mg every 10 days. She will also continue metformin 500 mg daily.  She has done well with weight loss and would like to continue Zepbound, but is concerned about cost if insurance will not cover. Will discuss further at next visit.

## 2023-01-10 NOTE — Progress Notes (Addendum)
Office: [phone]  /  Fax: [phone]  WEIGHT SUMMARY AND BIOMETRICS  Vitals Temp: 97.9 F (36.6 C) BP: 122/76 Pulse Rate: 66 SpO2: 100 %   Anthropometric Measurements Height: 5\' 3"  (1.6 m) Weight: 244 lb (110.7 kg) BMI (Calculated): 43.23 Weight at Last Visit: 248 lb Weight Lost Since Last Visit: 4 lb Weight Gained Since Last Visit: 0 lb Starting Weight: 264 lb Total Weight Loss (lbs): 20 lb (9.072 kg)   Body Composition  Body Fat %: 50.1 % Fat Mass (lbs): 122.4 lbs Muscle Mass (lbs): 115.6 lbs Total Body Water (lbs): 84 lbs Visceral Fat Rating : 17   Other Clinical Data Fasting: yes Labs: no Today's Visit #: 10 Starting Date: 05/25/22     HPI  Chief Complaint: OBESITY  Amy Mooney is here to discuss her progress with her obesity treatment plan. She is on the the Category 2 Plan and states she is following her eating plan approximately 75 % of the time. She states she is exercising/walking 7500 steps 7 times per week.   Interval History:  Since last office visit she has continued to do well with weight loss.  She is down another 1 lb and down 16 lbs overall.  Hunger and appetite controlled Not skipping meals Trying to make sure she gets in 7500 steps daily Work is going well. 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    Pharmacotherapy: Zepbound 5 mg every 10 days. No side effects with Zepbound- notes increased hunger by day 9. Metformin for prediabetes without side effects.   PHYSICAL EXAM:  Blood pressure 130/89, pulse 66, temperature 97.9 F (36.6 C), height 5\' 3"  (1.6 m), weight 248 lb (112.5 kg), SpO2 97 %. Body mass index is 43.93 kg/m.  General: She is overweight, cooperative, alert, well developed, and in no acute distress. PSYCH: Has normal mood, affect and thought process.   Lungs: Normal breathing effort, no conversational dyspnea.  DIAGNOSTIC DATA  REVIEWED:  BMET    Component Value Date/Time   NA 141 11/03/2022 1037   NA 138 05/25/2022 0828   NA 137 05/23/2013 1515   K 3.5 11/03/2022 1037   K 3.9 05/23/2013 1515   CL 100 11/03/2022 1037   CL 104 05/23/2013 1515   CO2 32 11/03/2022 1037   CO2 35 (H) 05/23/2013 1515   GLUCOSE 92 11/03/2022 1037   GLUCOSE 68 05/23/2013 1515   BUN 14 11/03/2022 1037   BUN 16 05/25/2022 0828   BUN 10 05/23/2013 1515   CREATININE 0.76 11/03/2022 1037   CREATININE 0.86 05/23/2013 1515   CALCIUM 9.6 11/03/2022 1037   CALCIUM 8.4 (L) 05/23/2013 1515   GFRNONAA >60 05/28/2019 0603   GFRNONAA >60 05/23/2013 1515   GFRAA >60 05/28/2019 0603   GFRAA >60 05/23/2013 1515   Lab Results  Component Value Date   HGBA1C 6.2 11/03/2022   HGBA1C 5.7 03/24/2016   Lab Results  Component Value Date   INSULIN 41.4 (H) 05/25/2022   INSULIN 29.0 (H) 08/19/2018   Lab Results  Component Value Date   TSH 2.820 05/25/2022   CBC    Component Value Date/Time   WBC 6.5 11/03/2022 1037   RBC 5.20 (H) 11/03/2022 1037   HGB 13.9 11/03/2022 1037   HGB 13.0 04/21/2019 1143   HCT 42.4 11/03/2022 1037   HCT 40.1 04/21/2019 1143   PLT 211.0 11/03/2022 1037   PLT 225 11/17/2016  1000   MCV 81.5 11/03/2022 1037   MCV 81 04/21/2019 1143   MCV 83 05/14/2013 1149   MCH 26.6 05/29/2019 0525   MCHC 32.8 11/03/2022 1037   RDW 14.8 11/03/2022 1037   RDW 15.6 (H) 04/21/2019 1143   RDW 14.1 05/14/2013 1149   Iron Studies    Component Value Date/Time   IRON 50 06/24/2014 1542   TIBC 328 06/24/2014 1542   FERRITIN 130 06/24/2014 1542   IRONPCTSAT 15 06/24/2014 1542   Lipid Panel     Component Value Date/Time   CHOL 151 05/25/2022 0828   TRIG 122 05/25/2022 0828   HDL 49 05/25/2022 0828   CHOLHDL 3 08/27/2020 0848   VLDL 26.0 08/27/2020 0848   LDLCALC 80 05/25/2022 0828   Hepatic Function Panel     Component Value Date/Time   PROT 7.1 11/03/2022 1037   PROT 7.1 05/25/2022 0828   ALBUMIN 4.5  11/03/2022 1037   ALBUMIN 4.5 05/25/2022 0828   AST 50 (H) 11/03/2022 1037   ALT 52 (H) 11/03/2022 1037   ALKPHOS 89 11/03/2022 1037   BILITOT 0.9 11/03/2022 1037   BILITOT 0.8 05/25/2022 0828      Component Value Date/Time   TSH 2.820 05/25/2022 0828   Nutritional Lab Results  Component Value Date   VD25OH 46.5 05/25/2022   VD25OH 50.04 08/27/2020   VD25OH 61.60 08/11/2019    ASSOCIATED CONDITIONS ADDRESSED TODAY  ASSESSMENT AND PLAN  Problem List Items Addressed This Visit     Pre-diabetes - Primary    Prediabetes Last A1c was 6.2  Medication(s): Zepbound 5.0 mg SQ weekly Lab Results  Component Value Date   HGBA1C 6.2 11/03/2022   HGBA1C 6.2 (H) 05/25/2022   HGBA1C 5.8 (A) 03/04/2021   HGBA1C 6.1 08/27/2020   HGBA1C 5.6 04/21/2019   Lab Results  Component Value Date   INSULIN 41.4 (H) 05/25/2022   INSULIN 22.8 04/21/2019   INSULIN 25.6 (H) 12/17/2018   INSULIN 29.0 (H) 08/19/2018   Plan: Continue and increase dose Zepbound to 7.5 mg every 10 days. She will also continue metformin 500 mg daily.  She has done well with weight loss and would like to continue Zepbound, but is concerned about cost if insurance will not cover. Will discuss further at next visit.        Elevated liver function tests    Patient has two of more risk factors for MASLD. Liver enzymes are elevated. Most recent imaging includes :NONE. Denies use of supplements, heavy alcohol consumption or steatogenic medications.    Plan: Follow-up test results. Losing 15 % of body may lower risk. It is also recommended that she avoid processed foods, simple sugars, heavy alcohol consumption and steatogenic medications. She is on incretin therapy and would be helpful to continue if not cost prohibitive .  She has a FIB-4 score of 1.84 indicating the need for further evaluation.  She is going to discuss further with her PCP- Dr. Vernona Rieger. First tier work-up to exclude other causes may include  hepatitis serologies, iron studies, ceruloplasmin  (if less than 35 y/o), ATT, TTG-IgA, anti-mitochondrial ab.  Immunoglobulin G (IgG) level, antinuclear antibody, anti-smooth muscle antibody (for female patients and/or those with aminotransferases >5 times the upper limit of normal or history of autoimmune disease).        Obesity (HCC)- Start BMI 46.77   Relevant Medications   tirzepatide (ZEPBOUND) 7.5 MG/0.5ML Pen   BMI 40.0-44.9, adult (HCC) Current BMI 44.0   Relevant  Medications   tirzepatide (ZEPBOUND) 7.5 MG/0.5ML Pen      TREATMENT PLAN FOR OBESITY:  Recommended Dietary Goals  Amy Mooney is currently in the action stage of change. As such, her goal is to continue weight management plan. She has agreed to the Category 2 Plan.  Behavioral Intervention  We discussed the following Behavioral Modification Strategies today: increasing lean protein intake, decreasing simple carbohydrates , avoiding skipping meals, increasing water intake, work on meal planning and easy cooking plans, planning for success, and keeping healthy foods at home.  Additional resources provided today: NA  Recommended Physical Activity Goals  Amy Mooney has been advised to work up to 150 minutes of moderate intensity aerobic activity a week and strengthening exercises 2-3 times per week for cardiovascular health, weight loss maintenance and preservation of muscle mass.   She has agreed to Continue current level of physical activity    Pharmacotherapy We discussed various medication options to help Amy Mooney with her weight loss efforts and we both agreed to increase Zepbound to 7.5 mg every 10 days and metformin 500 mg once daily for prediabetes.    Return in about 4 weeks (around 02/07/2023).Marland Kitchen She was informed of the importance of frequent follow up visits to maximize her success with intensive lifestyle modifications for her multiple health conditions.   ATTESTASTION STATEMENTS:  Reviewed by clinician on  day of visit: allergies, medications, problem list, medical history, surgical history, family history, social history, and previous encounter notes.   I have personally spent 40 minutes total time today in preparation, patient care, nutritional counseling and documentation for this visit, including the following: review of clinical lab tests; review of medical tests/procedures/services.      Scheryl Sanborn, PA-C

## 2023-01-10 NOTE — Assessment & Plan Note (Signed)
Patient has two of more risk factors for MASLD. Liver enzymes are elevated. Most recent imaging includes :NONE. Denies use of supplements, heavy alcohol consumption or steatogenic medications.    Plan: Follow-up test results. Losing 15 % of body may lower risk. It is also recommended that she avoid processed foods, simple sugars, heavy alcohol consumption and steatogenic medications. She is on incretin therapy and would be helpful to continue if not cost prohibitive .  She has a FIB-4 score of 1.84 indicating the need for further evaluation.  She is going to discuss further with her PCP- Dr. Alma Friendly. First tier work-up to exclude other causes may include hepatitis serologies, iron studies, ceruloplasmin  (if less than 38 y/o), ATT, TTG-IgA, anti-mitochondrial ab.  Immunoglobulin G (IgG) level, antinuclear antibody, anti-smooth muscle antibody (for female patients and/or those with aminotransferases >5 times the upper limit of normal or history of autoimmune disease).

## 2023-01-11 ENCOUNTER — Other Ambulatory Visit: Payer: Self-pay

## 2023-01-12 ENCOUNTER — Other Ambulatory Visit: Payer: Self-pay

## 2023-01-15 ENCOUNTER — Encounter (INDEPENDENT_AMBULATORY_CARE_PROVIDER_SITE_OTHER): Payer: Self-pay | Admitting: Physician Assistant

## 2023-01-15 ENCOUNTER — Other Ambulatory Visit (INDEPENDENT_AMBULATORY_CARE_PROVIDER_SITE_OTHER): Payer: Self-pay | Admitting: Physician Assistant

## 2023-01-15 ENCOUNTER — Other Ambulatory Visit: Payer: Self-pay

## 2023-01-15 MED ORDER — TIRZEPATIDE-WEIGHT MANAGEMENT 5 MG/0.5ML ~~LOC~~ SOAJ
5.0000 mg | SUBCUTANEOUS | 0 refills | Status: DC
  Filled 2023-01-15 (×2): qty 2, 28d supply, fill #0

## 2023-01-15 NOTE — Progress Notes (Signed)
Zepbound 7.5 mg is not available. Patient requests different dose be sent in and Zepbound 5 mg is available and ordered.  Folasade Mooty,PA-C

## 2023-01-16 ENCOUNTER — Other Ambulatory Visit: Payer: Self-pay

## 2023-01-31 ENCOUNTER — Other Ambulatory Visit: Payer: Self-pay

## 2023-02-05 ENCOUNTER — Encounter (INDEPENDENT_AMBULATORY_CARE_PROVIDER_SITE_OTHER): Payer: Self-pay | Admitting: Physician Assistant

## 2023-02-05 ENCOUNTER — Ambulatory Visit (INDEPENDENT_AMBULATORY_CARE_PROVIDER_SITE_OTHER): Payer: 59 | Admitting: Physician Assistant

## 2023-02-05 ENCOUNTER — Other Ambulatory Visit: Payer: Self-pay

## 2023-02-05 VITALS — BP 122/76 | HR 66 | Temp 97.9°F | Ht 63.0 in | Wt 244.0 lb

## 2023-02-05 DIAGNOSIS — I10 Essential (primary) hypertension: Secondary | ICD-10-CM | POA: Diagnosis not present

## 2023-02-05 DIAGNOSIS — R7303 Prediabetes: Secondary | ICD-10-CM

## 2023-02-05 DIAGNOSIS — R7989 Other specified abnormal findings of blood chemistry: Secondary | ICD-10-CM

## 2023-02-05 DIAGNOSIS — E669 Obesity, unspecified: Secondary | ICD-10-CM | POA: Diagnosis not present

## 2023-02-05 DIAGNOSIS — Z6841 Body Mass Index (BMI) 40.0 and over, adult: Secondary | ICD-10-CM

## 2023-02-05 MED ORDER — METFORMIN HCL ER 500 MG PO TB24
500.0000 mg | ORAL_TABLET | Freq: Every day | ORAL | 0 refills | Status: DC
  Filled 2023-02-05: qty 30, 30d supply, fill #0

## 2023-02-05 NOTE — Assessment & Plan Note (Signed)
Hypertension Hypertension well controlled, improved, and no significant medication side effects noted.  Medication(s): HCTZ 25 mg daily.   BP Readings from Last 3 Encounters:  02/05/23 122/76  01/10/23 130/89  12/21/22 (!) 148/86   Lab Results  Component Value Date   CREATININE 0.76 11/03/2022   CREATININE 0.76 05/25/2022   CREATININE 0.89 08/27/2020   Lab Results  Component Value Date   GFR 87.63 11/03/2022   GFR 73.62 08/27/2020   GFR 84.69 08/11/2019    Plan: Continue all antihypertensives at current dosages.Continue to work on nutrition plan to promote weight loss and improve BP control.

## 2023-02-05 NOTE — Assessment & Plan Note (Signed)
FIB-4 score of 1.84. Has not had imaging .  Plan: Have advised to follow up with PCP for further evaluation.  Will follow as continues to loose weight on Zepbound.  Losing 15 % of body may lower risk. It is also recommended that she avoid processed foods, simple sugars, heavy alcohol consumption and steatogenic medications.

## 2023-02-05 NOTE — Assessment & Plan Note (Signed)
Prediabetes Last A1c was 6.2 /Insulin 41.4- not at goals Medication(s): Zepbound 5.0 mg SQ weekly and Metformin 500 mg once daily breakfast No side effects with Zepbound or metformin. \ She is working on nutrition plan to decrease simple carbohydrates, increase lean proteins and exercise to promote weight loss, improve glycemic control and prevent progression to Type 2 diabetes.   Lab Results  Component Value Date   HGBA1C 6.2 11/03/2022   HGBA1C 6.2 (H) 05/25/2022   HGBA1C 5.8 (A) 03/04/2021   HGBA1C 6.1 08/27/2020   HGBA1C 5.6 04/21/2019   Lab Results  Component Value Date   INSULIN 41.4 (H) 05/25/2022   INSULIN 22.8 04/21/2019   INSULIN 25.6 (H) 12/17/2018   INSULIN 29.0 (H) 08/19/2018    Plan: Continue and refill Metformin 500 mg once daily breakfast and Continue Zepbound and increase to 7.5 mg every 7-10 days ( already on order and pharmacy reports can fill later this week)  Continue working on nutrition plan to decrease simple carbohydrates, increase lean proteins and exercise to promote weight loss, improve glycemic control and prevent progression to Type 2 diabetes.

## 2023-02-05 NOTE — Progress Notes (Signed)
Office: [phone]  /  Fax: [phone]  WEIGHT SUMMARY AND BIOMETRICS  Vitals Temp: 97.9 F (36.6 C) BP: 122/76 Pulse Rate: 66 SpO2: 100 %   Anthropometric Measurements Height:  (1.6 m) Weight: 244 lb (110.7 kg) BMI (Calculated): 43.23 Weight at Last Visit: 248 lb Weight Lost Since Last Visit: 4 lb Weight Gained Since Last Visit: 0 lb Starting Weight: 264 lb Total Weight Loss (lbs): 20 lb (9.072 kg)   Body Composition  Body Fat %: 50.1 % Fat Mass (lbs): 122.4 lbs Muscle Mass (lbs): 115.6 lbs Total Body Water (lbs): 84 lbs Visceral Fat Rating : 17   Other Clinical Data Fasting: yes Labs: no Today's Visit #: 10 Starting Date: 05/25/22     HPI  Chief Complaint: OBESITY  Amy Mooney is here to discuss her progress with her obesity treatment plan. She is on the the Category 2 Plan and states she is following her eating plan approximately 75 % of the time. She states she is exercising/walking 7500 steps 7 times per week.   Interval History:  Since last office visit she is down 4 lbs. Hunger/appetite well controlled. Work- very busy, still helping at [employer] as well as at [employer]. 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 Not skipping meals.  Exercising walking 7500 steps daily.     Pharmacotherapy: Zepbound 5 mg every 10 days without side effects. Reports some mild increase appetite at day 9, but manageable.   PHYSICAL EXAM:  Blood pressure 122/76, pulse 66, temperature 97.9 F (36.6 C), height  (1.6 m), weight 244 lb (110.7 kg), SpO2 100 %. Body mass index is 43.22 kg/m.  General: She is overweight, cooperative, alert, well developed, and in no acute distress. PSYCH: Has normal mood, affect and thought process.   Cardiovascular: regular rhythm Lungs: Normal breathing effort, no conversational dyspnea. Neuro: no focal deficits  DIAGNOSTIC DATA REVIEWED:  BMET    Component Value Date/Time   NA 141 11/03/2022 1037   NA 138  05/25/2022 0828   NA 137 05/23/2013 1515   K 3.5 11/03/2022 1037   K 3.9 05/23/2013 1515   CL 100 11/03/2022 1037   CL 104 05/23/2013 1515   CO2 32 11/03/2022 1037   CO2 35 (H) 05/23/2013 1515   GLUCOSE 92 11/03/2022 1037   GLUCOSE 68 05/23/2013 1515   BUN 14 11/03/2022 1037   BUN 16 05/25/2022 0828   BUN 10 05/23/2013 1515   CREATININE 0.76 11/03/2022 1037   CREATININE 0.86 05/23/2013 1515   CALCIUM 9.6 11/03/2022 1037   CALCIUM 8.4 (L) 05/23/2013 1515   GFRNONAA >60 05/28/2019 0603   GFRNONAA >60 05/23/2013 1515   GFRAA >60 05/28/2019 0603   GFRAA >60 05/23/2013 1515   Lab Results  Component Value Date   HGBA1C 6.2 11/03/2022   HGBA1C 5.7 03/24/2016   Lab Results  Component Value Date   INSULIN 41.4 (H) 05/25/2022   INSULIN 29.0 (H) 08/19/2018   Lab Results  Component Value Date   TSH 2.820 05/25/2022   CBC    Component Value Date/Time   WBC 6.5 11/03/2022 1037   RBC 5.20 (H) 11/03/2022 1037   HGB 13.9 11/03/2022 1037   HGB 13.0 04/21/2019 1143   HCT 42.4 11/03/2022 1037   HCT 40.1 04/21/2019 1143   PLT 211.0 11/03/2022 1037   PLT 225 11/17/2016 1000   MCV 81.5 11/03/2022 1037   MCV 81 04/21/2019 1143   MCV 83 05/14/2013 1149   MCH  26.6 05/29/2019 0525   MCHC 32.8 11/03/2022 1037   RDW 14.8 11/03/2022 1037   RDW 15.6 (H) 04/21/2019 1143   RDW 14.1 05/14/2013 1149   Iron Studies    Component Value Date/Time   IRON 50 06/24/2014 1542   TIBC 328 06/24/2014 1542   FERRITIN 130 06/24/2014 1542   IRONPCTSAT 15 06/24/2014 1542   Lipid Panel     Component Value Date/Time   CHOL 151 05/25/2022 0828   TRIG 122 05/25/2022 0828   HDL 49 05/25/2022 0828   CHOLHDL 3 08/27/2020 0848   VLDL 26.0 08/27/2020 0848   LDLCALC 80 05/25/2022 0828   Hepatic Function Panel     Component Value Date/Time   PROT 7.1 11/03/2022 1037   PROT 7.1 05/25/2022 0828   ALBUMIN 4.5 11/03/2022 1037   ALBUMIN 4.5 05/25/2022 0828   AST 50 (H) 11/03/2022 1037   ALT 52 (H)  11/03/2022 1037   ALKPHOS 89 11/03/2022 1037   BILITOT 0.9 11/03/2022 1037   BILITOT 0.8 05/25/2022 0828      Component Value Date/Time   TSH 2.820 05/25/2022 0828   Nutritional Lab Results  Component Value Date   VD25OH 46.5 05/25/2022   VD25OH 50.04 08/27/2020   VD25OH 61.60 08/11/2019    ASSOCIATED CONDITIONS ADDRESSED TODAY  ASSESSMENT AND PLAN  Problem List Items Addressed This Visit     Essential hypertension    Hypertension Hypertension well controlled, improved, and no significant medication side effects noted.  Medication(s): HCTZ 25 mg daily.   BP Readings from Last 3 Encounters:  02/05/23 122/76  01/10/23 130/89  12/21/22 (!) 148/86   Lab Results  Component Value Date   CREATININE 0.76 11/03/2022   CREATININE 0.76 05/25/2022   CREATININE 0.89 08/27/2020   Lab Results  Component Value Date   GFR 87.63 11/03/2022   GFR 73.62 08/27/2020   GFR 84.69 08/11/2019   Plan: Continue all antihypertensives at current dosages.Continue to work on nutrition plan to promote weight loss and improve BP control.         Pre-diabetes - Primary    Prediabetes Last A1c was 6.2 /Insulin 41.4- not at goals Medication(s): Zepbound 5.0 mg SQ weekly and Metformin 500 mg once daily breakfast No side effects with Zepbound or metformin. \ She is working on nutrition plan to decrease simple carbohydrates, increase lean proteins and exercise to promote weight loss, improve glycemic control and prevent progression to Type 2 diabetes.   Lab Results  Component Value Date   HGBA1C 6.2 11/03/2022   HGBA1C 6.2 (H) 05/25/2022   HGBA1C 5.8 (A) 03/04/2021   HGBA1C 6.1 08/27/2020   HGBA1C 5.6 04/21/2019   Lab Results  Component Value Date   INSULIN 41.4 (H) 05/25/2022   INSULIN 22.8 04/21/2019   INSULIN 25.6 (H) 12/17/2018   INSULIN 29.0 (H) 08/19/2018   Plan: Continue and refill Metformin 500 mg once daily breakfast and Continue Zepbound and increase to 7.5 mg every 7-10  days ( already on order and pharmacy reports can fill later this week)  Continue working on nutrition plan to decrease simple carbohydrates, increase lean proteins and exercise to promote weight loss, improve glycemic control and prevent progression to Type 2 diabetes.         Relevant Medications   metFORMIN (GLUCOPHAGE-XR) 500 MG 24 hr tablet   Elevated liver function tests    FIB-4 score of 1.84. Has not had imaging .  Plan: Have advised to follow up with PCP for further  evaluation.  Will follow as continues to loose weight on Zepbound.  Losing 15 % of body may lower risk. It is also recommended that she avoid processed foods, simple sugars, heavy alcohol consumption and steatogenic medications.        Obesity (HCC)- Start BMI 46.77   Relevant Medications   metFORMIN (GLUCOPHAGE-XR) 500 MG 24 hr tablet   BMI 40.0-44.9, adult (HCC) Current BMI 44.0   Relevant Medications   metFORMIN (GLUCOPHAGE-XR) 500 MG 24 hr tablet      TREATMENT PLAN FOR OBESITY:  Recommended Dietary Goals  Cally is currently in the action stage of change. As such, her goal is to continue weight management plan. She has agreed to the Category 2 Plan.  Behavioral Intervention  We discussed the following Behavioral Modification Strategies today: increasing lean protein intake, decreasing simple carbohydrates , increasing fiber rich foods, avoiding skipping meals, increasing water intake, and planning for success.  Additional resources provided today: NA  Recommended Physical Activity Goals  Miyoshi has been advised to work up to 150 minutes of moderate intensity aerobic activity a week and strengthening exercises 2-3 times per week for cardiovascular health, weight loss maintenance and preservation of muscle mass.   She has agreed to Continue current level of physical activity    Pharmacotherapy We discussed various medication options to help [facility] with her weight loss efforts and we both agreed to  increase Zepbound to 7.5 mg every 7-10 days and continue to work on nutritional and behavioral strategies to promote weight loss.    Return in about 4 weeks (around 03/05/2023).Marland Kitchen She was informed of the importance of frequent follow up visits to maximize her success with intensive lifestyle modifications for her multiple health conditions.   ATTESTASTION STATEMENTS:  Reviewed by clinician on day of visit: allergies, medications, problem list, medical history, surgical history, family history, social history, and previous encounter notes.   I have personally spent 30 minutes total time today in preparation, patient care, nutritional counseling and documentation for this visit, including the following: review of clinical lab tests; review of medical tests/procedures/services.      Joon Pohle, PA-C

## 2023-02-07 ENCOUNTER — Other Ambulatory Visit: Payer: Self-pay

## 2023-02-07 ENCOUNTER — Encounter (INDEPENDENT_AMBULATORY_CARE_PROVIDER_SITE_OTHER): Payer: Self-pay | Admitting: Physician Assistant

## 2023-02-08 ENCOUNTER — Other Ambulatory Visit: Payer: Self-pay

## 2023-02-20 ENCOUNTER — Other Ambulatory Visit (INDEPENDENT_AMBULATORY_CARE_PROVIDER_SITE_OTHER): Payer: Self-pay | Admitting: Physician Assistant

## 2023-02-20 DIAGNOSIS — E559 Vitamin D deficiency, unspecified: Secondary | ICD-10-CM

## 2023-02-20 DIAGNOSIS — R7303 Prediabetes: Secondary | ICD-10-CM

## 2023-02-20 DIAGNOSIS — R7989 Other specified abnormal findings of blood chemistry: Secondary | ICD-10-CM

## 2023-02-23 DIAGNOSIS — R7989 Other specified abnormal findings of blood chemistry: Secondary | ICD-10-CM | POA: Diagnosis not present

## 2023-02-23 DIAGNOSIS — E559 Vitamin D deficiency, unspecified: Secondary | ICD-10-CM | POA: Diagnosis not present

## 2023-02-23 DIAGNOSIS — R7303 Prediabetes: Secondary | ICD-10-CM | POA: Diagnosis not present

## 2023-02-24 LAB — CMP14+EGFR
ALT: 37 IU/L — ABNORMAL HIGH (ref 0–32)
AST: 33 IU/L (ref 0–40)
Albumin/Globulin Ratio: 1.5 (ref 1.2–2.2)
Albumin: 4.3 g/dL (ref 3.8–4.9)
Alkaline Phosphatase: 117 IU/L (ref 44–121)
BUN/Creatinine Ratio: 19 (ref 9–23)
BUN: 16 mg/dL (ref 6–24)
Bilirubin Total: 0.8 mg/dL (ref 0.0–1.2)
CO2: 26 mmol/L (ref 20–29)
Calcium: 9.4 mg/dL (ref 8.7–10.2)
Chloride: 101 mmol/L (ref 96–106)
Creatinine, Ser: 0.85 mg/dL (ref 0.57–1.00)
Globulin, Total: 2.8 g/dL (ref 1.5–4.5)
Glucose: 83 mg/dL (ref 70–99)
Potassium: 4.1 mmol/L (ref 3.5–5.2)
Sodium: 141 mmol/L (ref 134–144)
Total Protein: 7.1 g/dL (ref 6.0–8.5)
eGFR: 80 mL/min/{1.73_m2} (ref >59)

## 2023-02-24 LAB — HEMOGLOBIN A1C
Est. average glucose Bld gHb Est-mCnc: 126 mg/dL
Hgb A1c MFr Bld: 6 % — ABNORMAL HIGH (ref 4.8–5.6)

## 2023-02-24 LAB — VITAMIN D 25 HYDROXY (VIT D DEFICIENCY, FRACTURES): Vit D, 25-Hydroxy: 42.1 ng/mL (ref 30.0–100.0)

## 2023-03-05 ENCOUNTER — Encounter (INDEPENDENT_AMBULATORY_CARE_PROVIDER_SITE_OTHER): Payer: Self-pay | Admitting: Physician Assistant

## 2023-03-05 ENCOUNTER — Ambulatory Visit (INDEPENDENT_AMBULATORY_CARE_PROVIDER_SITE_OTHER): Payer: 59 | Admitting: Physician Assistant

## 2023-03-05 ENCOUNTER — Other Ambulatory Visit: Payer: Self-pay

## 2023-03-05 VITALS — BP 129/77 | HR 66 | Temp 97.8°F | Ht 63.0 in | Wt 243.0 lb

## 2023-03-05 DIAGNOSIS — E7849 Other hyperlipidemia: Secondary | ICD-10-CM | POA: Diagnosis not present

## 2023-03-05 DIAGNOSIS — Z6841 Body Mass Index (BMI) 40.0 and over, adult: Secondary | ICD-10-CM | POA: Diagnosis not present

## 2023-03-05 DIAGNOSIS — R7989 Other specified abnormal findings of blood chemistry: Secondary | ICD-10-CM

## 2023-03-05 DIAGNOSIS — E559 Vitamin D deficiency, unspecified: Secondary | ICD-10-CM | POA: Diagnosis not present

## 2023-03-05 DIAGNOSIS — R7303 Prediabetes: Secondary | ICD-10-CM

## 2023-03-05 DIAGNOSIS — I10 Essential (primary) hypertension: Secondary | ICD-10-CM | POA: Diagnosis not present

## 2023-03-05 MED ORDER — METFORMIN HCL ER 500 MG PO TB24
500.0000 mg | ORAL_TABLET | Freq: Every day | ORAL | 0 refills | Status: DC
  Filled 2023-03-05: qty 30, 30d supply, fill #0

## 2023-03-05 NOTE — Progress Notes (Signed)
.smr  Office: [phone]  /  Fax: [phone]  WEIGHT SUMMARY AND BIOMETRICS  Vitals Temp: 97.8 F (36.6 C) BP: 129/77 Pulse Rate: 66 SpO2: 98 %   Anthropometric Measurements Height: 5\' 3"  (1.6 m) Weight: 243 lb (110.2 kg) BMI (Calculated): 43.06 Weight Lost Since Last Visit: 1 lb Weight Gained Since Last Visit: 0 Starting Weight: 244 lb   Body Composition  Body Fat %: 50.3 % Fat Mass (lbs): 122.6 lbs Muscle Mass (lbs): 115 lbs Total Body Water (lbs): 86.6 lbs Visceral Fat Rating : 17   Other Clinical Data Fasting: yes Labs: no Today's Visit #: 11 Starting Date: 05/25/22     HPI  Chief Complaint: OBESITY  Amy Mooney is here to discuss her progress with her obesity treatment plan. She is on the the Category 2 Plan and states she is following her eating plan approximately 75 % of the time. She states she is exercising/walking 7500 steps a day 7 times per week.   Interval History:  Since last office visit she up 2 lbs.  Hunger/appetite-well controlled overall. Not skipping meals Cravings- Some at lunchtime with lunches in the office and discussed strategies to make better choices overall.  Stress- Working in both [place] and [place], but manageable.  Sleep- okay overall.  Exercise-walking 7500 steps daily   Pharmacotherapy: Zepbound 7.5 mg every ~10 days- Plans to stop after completes the last 4 doses as not longer has insurance coverage.Denies mass in neck, dysphagia, dyspepsia, persistent hoarseness, abdominal pain, or N/V/Constipation or diarrhea. Has annual eye exam. Mood is stable.   Metformin for primary indication of prediabetes.   TREATMENT PLAN FOR OBESITY:  Recommended Dietary Goals  Amy Mooney is currently in the action stage of change. As such, her goal is to continue weight management plan. She has agreed to the Category 2 Plan.  Behavioral Intervention  We discussed the following Behavioral Modification Strategies today: increasing lean  protein intake, decreasing simple carbohydrates , increasing vegetables, increasing lower glycemic fruits, increasing fiber rich foods, increasing water intake, continue to practice mindfulness when eating, planning for success, and better snacking choices.  Additional resources provided today: NA  Recommended Physical Activity Goals  Amy Mooney has been advised to work up to 150 minutes of moderate intensity aerobic activity a week and strengthening exercises 2-3 times per week for cardiovascular health, weight loss maintenance and preservation of muscle mass.   She has agreed to Continue current level of physical activity  and Start strengthening exercises with a goal of 2-3 sessions a week    Pharmacotherapy We discussed various medication options to help [facility] with her weight loss efforts and we both agreed to continue metformin for primary indication of prediabetes and consider increasing to twice daily and continue Zepbound 7.5 mg every 10 days until completes last 4 doses.  We briefly discussed topamax for cravings as she comes off Zepbound and she has taken this in the past many years ago for migraine prophylaxis.    Return in about 4 weeks (around 04/02/2023).Marland Kitchen She was informed of the importance of frequent follow up visits to maximize her success with intensive lifestyle modifications for her multiple health conditions.  PHYSICAL EXAM:  Blood pressure 129/77, pulse 66, temperature 97.8 F (36.6 C), height 5\' 3"  (1.6 m), weight 243 lb (110.2 kg), SpO2 98 %. Body mass index is 43.05 kg/m.  General: She is overweight, cooperative, alert, well developed, and in no acute distress. PSYCH: Has normal mood, affect and thought process.   Cardiovascular: HR 60's  regular Lungs: Normal breathing effort, no conversational dyspnea. Neuro: grossly intact  DIAGNOSTIC DATA REVIEWED:  BMET    Component Value Date/Time   NA 141 02/23/2023 1045   NA 137 05/23/2013 1515   K 4.1 02/23/2023 1045    K 3.9 05/23/2013 1515   CL 101 02/23/2023 1045   CL 104 05/23/2013 1515   CO2 26 02/23/2023 1045   CO2 35 (H) 05/23/2013 1515   GLUCOSE 83 02/23/2023 1045   GLUCOSE 92 11/03/2022 1037   GLUCOSE 68 05/23/2013 1515   BUN 16 02/23/2023 1045   BUN 10 05/23/2013 1515   CREATININE 0.85 02/23/2023 1045   CREATININE 0.86 05/23/2013 1515   CALCIUM 9.4 02/23/2023 1045   CALCIUM 8.4 (L) 05/23/2013 1515   GFRNONAA >60 05/28/2019 0603   GFRNONAA >60 05/23/2013 1515   GFRAA >60 05/28/2019 0603   GFRAA >60 05/23/2013 1515   Lab Results  Component Value Date   HGBA1C 6.0 (H) 02/23/2023   HGBA1C 5.7 03/24/2016   Lab Results  Component Value Date   INSULIN 41.4 (H) 05/25/2022   INSULIN 29.0 (H) 08/19/2018   Lab Results  Component Value Date   TSH 2.820 05/25/2022   CBC    Component Value Date/Time   WBC 6.5 11/03/2022 1037   RBC 5.20 (H) 11/03/2022 1037   HGB 13.9 11/03/2022 1037   HGB 13.0 04/21/2019 1143   HCT 42.4 11/03/2022 1037   HCT 40.1 04/21/2019 1143   PLT 211.0 11/03/2022 1037   PLT 225 11/17/2016 1000   MCV 81.5 11/03/2022 1037   MCV 81 04/21/2019 1143   MCV 83 05/14/2013 1149   MCH 26.6 05/29/2019 0525   MCHC 32.8 11/03/2022 1037   RDW 14.8 11/03/2022 1037   RDW 15.6 (H) 04/21/2019 1143   RDW 14.1 05/14/2013 1149   Iron Studies    Component Value Date/Time   IRON 50 06/24/2014 1542   TIBC 328 06/24/2014 1542   FERRITIN 130 06/24/2014 1542   IRONPCTSAT 15 06/24/2014 1542   Lipid Panel     Component Value Date/Time   CHOL 151 05/25/2022 0828   TRIG 122 05/25/2022 0828   HDL 49 05/25/2022 0828   CHOLHDL 3 08/27/2020 0848   VLDL 26.0 08/27/2020 0848   LDLCALC 80 05/25/2022 0828   Hepatic Function Panel     Component Value Date/Time   PROT 7.1 02/23/2023 1045   ALBUMIN 4.3 02/23/2023 1045   AST 33 02/23/2023 1045   ALT 37 (H) 02/23/2023 1045   ALKPHOS 117 02/23/2023 1045   BILITOT 0.8 02/23/2023 1045      Component Value Date/Time   TSH 2.820  05/25/2022 0828   Nutritional Lab Results  Component Value Date   VD25OH 42.1 02/23/2023   VD25OH 46.5 05/25/2022   VD25OH 50.04 08/27/2020    ASSOCIATED CONDITIONS ADDRESSED TODAY  ASSESSMENT AND PLAN  Problem List Items Addressed This Visit     Essential hypertension   Vitamin D deficiency   Other hyperlipidemia   Pre-diabetes - Primary   Relevant Medications   metFORMIN (GLUCOPHAGE-XR) 500 MG 24 hr tablet   Elevated liver function tests   Obesity (HCC)- Start BMI 46.77   Relevant Medications   metFORMIN (GLUCOPHAGE-XR) 500 MG 24 hr tablet   BMI 40.0-44.9, adult (HCC) Current BMI 44.0   Relevant Medications   metFORMIN (GLUCOPHAGE-XR) 500 MG 24 hr tablet  Hypertension Hypertension well controlled.  Medication(s): HCTZ 25 mg daily Labs were reviewed today and discussed with the patient.  Renal function is normal.    BP Readings from Last 3 Encounters:  03/05/23 129/77  02/05/23 122/76  01/10/23 130/89   Lab Results  Component Value Date   CREATININE 0.85 02/23/2023   CREATININE 0.76 11/03/2022   CREATININE 0.76 05/25/2022   Lab Results  Component Value Date   GFR 87.63 11/03/2022   GFR 73.62 08/27/2020   GFR 84.69 08/11/2019    Plan: Continue all antihypertensives at current dosages. Continue to work on nutrition plan to promote weight loss and improve BP control.   Prediabetes Labs were reviewed today and discussed with the patient.  Last A1c was 6.0, improved- not at goal.   Medication(s): Zepbound 7.5 mg SQ weekly and Metformin 500 mg once daily breakfast Lab Results  Component Value Date   HGBA1C 6.0 (H) 02/23/2023   HGBA1C 6.2 11/03/2022   HGBA1C 6.2 (H) 05/25/2022   HGBA1C 5.8 (A) 03/04/2021   HGBA1C 6.1 08/27/2020   Lab Results  Component Value Date   INSULIN 41.4 (H) 05/25/2022   INSULIN 22.8 04/21/2019   INSULIN 25.6 (H) 12/17/2018   INSULIN 29.0 (H) 08/19/2018    Plan: Continue and refill Metformin 500 mg once daily breakfast  and continue Zepbound 7.5 mg every 10 days until completes doses. She plans to stop Zepbound after this due to cost concerns. Will likely increase metformin to 500 mg twice daily next visit and also consider adding topamax for cravings.  Continue working on nutrition plan to decrease simple carbohydrates, increase lean proteins and exercise to promote weight loss, improve glycemic control and prevent progression to Type 2 diabetes.   Hyperlipidemia LDL is at goal. Medication(s): None Cardiovascular risk factors: dyslipidemia and obesity (BMI >= 30 kg/m2)  Lab Results  Component Value Date   CHOL 151 05/25/2022   HDL 49 05/25/2022   LDLCALC 80 05/25/2022   TRIG 122 05/25/2022   CHOLHDL 3 08/27/2020   CHOLHDL 4 08/11/2019   CHOLHDL 3 06/03/2018   Lab Results  Component Value Date   ALT 37 (H) 02/23/2023   AST 33 02/23/2023   ALKPHOS 117 02/23/2023   BILITOT 0.8 02/23/2023   The 10-year ASCVD risk score (Arnett DK, et al., 2019) is: 2.5%   Values used to calculate the score:     Age: 57 years     Sex: Female     Is Non-Hispanic African American: No     Diabetic: No     Tobacco smoker: No     Systolic Blood Pressure: 129 mmHg     Is BP treated: Yes     HDL Cholesterol: 49 mg/dL     Total Cholesterol: 151 mg/dL  Plan: Continue working on nutrition plan to promote weight loss and improve lipid profile/decrease CV risk.   Elevated liver function :    FIB-4 score of 1.84. Has not had imaging .  Labs were reviewed today and discussed with the patient.  [place] has a dx of elevated Liver function studies but these are improving with weight loss. Her BMI is over 40. She denies abdominal pain or jaundice and has never been told of any liver problems in the past. She denies excessive alcohol intake.  Lab Results  Component Value Date   ALT 37 (H) 02/23/2023   AST 33 02/23/2023   ALKPHOS 117 02/23/2023   BILITOT 0.8 02/23/2023    Plan:   Losing 15 % of body may lower risk. It  is also recommended that she avoid processed foods, simple sugars,  heavy alcohol consumption and steatogenic medications. Will follow as she continues with weight loss.   Vitamin D Deficiency Vitamin D is not at goal of 50.  Most recent vitamin D level was 42.1. She is on OTC vitamin D3 5000 IU daily. Lab Results  Component Value Date   VD25OH 42.1 02/23/2023   VD25OH 46.5 05/25/2022   VD25OH 50.04 08/27/2020    Plan: Continue OTC vitamin D3 5000 IU daily Low vitamin D levels can be associated with adiposity and may result in leptin resistance and weight gain. Also associated with fatigue. Currently on vitamin D supplementation without any adverse effects.       ATTESTASTION STATEMENTS:  Reviewed by clinician on day of visit: allergies, medications, problem list, medical history, surgical history, family history, social history, and previous encounter notes.   I have personally spent 37 minutes total time today in preparation, patient care, nutritional counseling and documentation for this visit, including the following: review of clinical lab tests; review of medical tests/procedures/services.      Hana Trippett, PA-C

## 2023-03-16 ENCOUNTER — Ambulatory Visit: Payer: Commercial Managed Care - PPO

## 2023-03-21 ENCOUNTER — Other Ambulatory Visit: Payer: Self-pay

## 2023-03-22 ENCOUNTER — Encounter: Payer: Self-pay | Admitting: Dermatology

## 2023-03-22 ENCOUNTER — Ambulatory Visit (INDEPENDENT_AMBULATORY_CARE_PROVIDER_SITE_OTHER): Payer: 59 | Admitting: Dermatology

## 2023-03-22 DIAGNOSIS — L089 Local infection of the skin and subcutaneous tissue, unspecified: Secondary | ICD-10-CM | POA: Diagnosis not present

## 2023-03-22 DIAGNOSIS — Z1283 Encounter for screening for malignant neoplasm of skin: Secondary | ICD-10-CM

## 2023-03-22 DIAGNOSIS — L814 Other melanin hyperpigmentation: Secondary | ICD-10-CM | POA: Diagnosis not present

## 2023-03-22 DIAGNOSIS — W908XXA Exposure to other nonionizing radiation, initial encounter: Secondary | ICD-10-CM | POA: Diagnosis not present

## 2023-03-22 DIAGNOSIS — X32XXXA Exposure to sunlight, initial encounter: Secondary | ICD-10-CM | POA: Diagnosis not present

## 2023-03-22 DIAGNOSIS — L578 Other skin changes due to chronic exposure to nonionizing radiation: Secondary | ICD-10-CM

## 2023-03-22 DIAGNOSIS — D1801 Hemangioma of skin and subcutaneous tissue: Secondary | ICD-10-CM

## 2023-03-22 DIAGNOSIS — D229 Melanocytic nevi, unspecified: Secondary | ICD-10-CM

## 2023-03-22 DIAGNOSIS — L821 Other seborrheic keratosis: Secondary | ICD-10-CM | POA: Diagnosis not present

## 2023-03-22 NOTE — Patient Instructions (Signed)
Recommend daily broad spectrum sunscreen SPF 30+ to sun-exposed areas, reapply every 2 hours as needed. Call for new or changing lesions.  Staying in the shade or wearing long sleeves, sun glasses (UVA+UVB protection) and wide brim hats (4-inch brim around the entire circumference of the hat) are also recommended for sun protection.    Recommend taking Heliocare sun protection supplement daily in sunny weather for additional sun protection. For maximum protection on the sunniest days, you can take up to 2 capsules of regular Heliocare OR take 1 capsule of Heliocare Ultra. For prolonged exposure (such as a full day in the sun), you can repeat your dose of the supplement 4 hours after your first dose. Heliocare can be purchased at [facility], at some [facility] or at www.heliocare.com.    Melanoma ABCDEs  Melanoma is the most dangerous type of skin cancer, and is the leading cause of death from skin disease.  You are more likely to develop melanoma if you: Have light-colored skin, light-colored eyes, or red or blond hair Spend a lot of time in the sun Tan regularly, either outdoors or in a tanning bed Have had blistering sunburns, especially during childhood Have a close family member who has had a melanoma Have atypical moles or large birthmarks  Early detection of melanoma is key since treatment is typically straightforward and cure rates are extremely high if we catch it early.   The first sign of melanoma is often a change in a mole or a new dark spot.  The ABCDE system is a way of remembering the signs of melanoma.  A for asymmetry:  The two halves do not match. B for border:  The edges of the growth are irregular. C for color:  A mixture of colors are present instead of an even brown color. D for diameter:  Melanomas are usually (but not always) greater than 6mm - the size of a pencil eraser. E for evolution:  The spot keeps changing in size, shape, and color.  Please check  your skin once per month between visits. You can use a small mirror in front and a large mirror behind you to keep an eye on the back side or your body.   If you see any new or changing lesions before your next follow-up, please call to schedule a visit.  Please continue daily skin protection including broad spectrum sunscreen SPF 30+ to sun-exposed areas, reapplying every 2 hours as needed when you're outdoors.   Staying in the shade or wearing long sleeves, sun glasses (UVA+UVB protection) and wide brim hats (4-inch brim around the entire circumference of the hat) are also recommended for sun protection.    Due to recent changes in healthcare laws, you may see results of your pathology and/or laboratory studies on MyChart before the doctors have had a chance to review them. We understand that in some cases there may be results that are confusing or concerning to you. Please understand that not all results are received at the same time and often the doctors may need to interpret multiple results in order to provide you with the best plan of care or course of treatment. Therefore, we ask that you please give us 2 business days to thoroughly review all your results before contacting the office for clarification. Should we see a critical lab result, you will be contacted sooner.   If You Need Anything After Your Visit  If you have any questions or concerns for your doctor, please   call our main line at [phone] and press option 4 to reach your doctor's medical assistant. If no one answers, please leave a voicemail as directed and we will return your call as soon as possible. Messages left after 4 pm will be answered the following business day.   You may also send us a message via MyChart. We typically respond to MyChart messages within 1-2 business days.  For prescription refills, please ask your pharmacy to contact our office. Our fax number is [phone].  If you have an urgent issue when the  clinic is closed that cannot wait until the next business day, you can page your doctor at the number below.    Please note that while we do our best to be available for urgent issues outside of office hours, we are not available 24/7.   If you have an urgent issue and are unable to reach us, you may choose to seek medical care at your doctor's office, retail clinic, urgent care center, or emergency room.  If you have a medical emergency, please immediately call 911 or go to the emergency department.  Pager Numbers  - Dr. Kowalski: [phone]  - Dr. Moye: [phone]  - Dr. Stewart: [phone]  In the event of inclement weather, please call our main line at [phone] for an update on the status of any delays or closures.  Dermatology Medication Tips: Please keep the boxes that topical medications come in in order to help keep track of the instructions about where and how to use these. Pharmacies typically print the medication instructions only on the boxes and not directly on the medication tubes.   If your medication is too expensive, please contact our office at [phone] option 4 or send us a message through MyChart.   We are unable to tell what your co-pay for medications will be in advance as this is different depending on your insurance coverage. However, we may be able to find a substitute medication at lower cost or fill out paperwork to get insurance to cover a needed medication.   If a prior authorization is required to get your medication covered by your insurance company, please allow us 1-2 business days to complete this process.  Drug prices often vary depending on where the prescription is filled and some pharmacies may offer cheaper prices.  The website www.goodrx.com contains coupons for medications through different pharmacies. The prices here do not account for what the cost may be with help from insurance (it may be cheaper with your insurance), but the  website can give you the price if you did not use any insurance.  - You can print the associated coupon and take it with your prescription to the pharmacy.  - You may also stop by our office during regular business hours and pick up a GoodRx coupon card.  - If you need your prescription sent electronically to a different pharmacy, notify our office through [facility] MyChart or by phone at [phone] option 4.     Si Usted Necesita Algo Despus de Su Visita  Tambin puede enviarnos un mensaje a travs de MyChart. Por lo general respondemos a los mensajes de MyChart en el transcurso de 1 a 2 das hbiles.  Para renovar recetas, por favor pida a su farmacia que se ponga en contacto con nuestra oficina. Nuestro nmero de fax es el [phone].  Si tiene un asunto urgente cuando la clnica est cerrada y que no puede esperar hasta el siguiente da hbil,   puede llamar/localizar a su doctor(a) al nmero que aparece a continuacin.   Por favor, tenga en cuenta que aunque hacemos todo lo posible para estar disponibles para asuntos urgentes fuera del horario de oficina, no estamos disponibles las 24 horas del da, los 7 das de la semana.   Si tiene un problema urgente y no puede comunicarse con nosotros, puede optar por buscar atencin mdica  en el consultorio de su doctor(a), en una clnica privada, en un centro de atencin urgente o en una sala de emergencias.  Si tiene una emergencia mdica, por favor llame inmediatamente al 911 o vaya a la sala de emergencias.  Nmeros de bper  - Dr. Kowalski: [phone]  - Dra. Moye: [phone]  - Dra. Stewart: [phone]  En caso de inclemencias del tiempo, por favor llame a nuestra lnea principal al [phone] para una actualizacin sobre el estado de cualquier retraso o cierre.  Consejos para la medicacin en dermatologa: Por favor, guarde las cajas en las que vienen los medicamentos de uso tpico para ayudarle a seguir las  instrucciones sobre dnde y cmo usarlos. Las farmacias generalmente imprimen las instrucciones del medicamento slo en las cajas y no directamente en los tubos del medicamento.   Si su medicamento es muy caro, por favor, pngase en contacto con nuestra oficina llamando al [phone] y presione la opcin 4 o envenos un mensaje a travs de MyChart.   No podemos decirle cul ser su copago por los medicamentos por adelantado ya que esto es diferente dependiendo de la cobertura de su seguro. Sin embargo, es posible que podamos encontrar un medicamento sustituto a menor costo o llenar un formulario para que el seguro cubra el medicamento que se considera necesario.   Si se requiere una autorizacin previa para que su compaa de seguros cubra su medicamento, por favor permtanos de 1 a 2 das hbiles para completar este proceso.  Los precios de los medicamentos varan con frecuencia dependiendo del lugar de dnde se surte la receta y alguna farmacias pueden ofrecer precios ms baratos.  El sitio web www.goodrx.com tiene cupones para medicamentos de diferentes farmacias. Los precios aqu no tienen en cuenta lo que podra costar con la ayuda del seguro (puede ser ms barato con su seguro), pero el sitio web puede darle el precio si no utiliz ningn seguro.  - Puede imprimir el cupn correspondiente y llevarlo con su receta a la farmacia.  - Tambin puede pasar por nuestra oficina durante el horario de atencin regular y recoger una tarjeta de cupones de GoodRx.  - Si necesita que su receta se enve electrnicamente a una farmacia diferente, informe a nuestra oficina a travs de MyChart de [facility] o por telfono llamando al [phone] y presione la opcin 4.  

## 2023-03-22 NOTE — Progress Notes (Signed)
   Follow-Up Visit   Subjective  Amy Mooney is a 57 y.o. female who presents for the following: Skin Cancer Screening and Full Body Skin Exam. Hx of several dysplastic nevi, some severe.  The patient presents for Total-Body Skin Exam (TBSE) for skin cancer screening and mole check. The patient has spots, moles and lesions to be evaluated, some may be new or changing and the patient has concerns that these could be cancer.    The following portions of the chart were reviewed this encounter and updated as appropriate: medications, allergies, medical history  Review of Systems:  No other skin or systemic complaints except as noted in HPI or Assessment and Plan.  Objective  Well appearing patient in no apparent distress; mood and affect are within normal limits.  A full examination was performed including scalp, head, eyes, ears, nose, lips, neck, chest, axillae, abdomen, back, buttocks, bilateral upper extremities, bilateral lower extremities, hands, feet, fingers, toes, fingernails, and toenails. All findings within normal limits unless otherwise noted below.   Relevant physical exam findings are noted in the Assessment and Plan.         Assessment & Plan   LENTIGINES, SEBORRHEIC KERATOSES, HEMANGIOMAS - Benign normal skin lesions - Benign-appearing - Call for any changes  MELANOCYTIC NEVI - Tan-brown and/or pink-flesh-colored symmetric macules and papules - Benign appearing on exam today - Observation - Call clinic for new or changing moles - Recommend daily use of broad spectrum spf 30+ sunscreen to sun-exposed areas.   ACTINIC DAMAGE - Chronic condition, secondary to cumulative UV/sun exposure - diffuse scaly erythematous macules with underlying dyspigmentation - Recommend daily broad spectrum sunscreen SPF 30+ to sun-exposed areas, reapply every 2 hours as needed.  - Staying in the shade or wearing long sleeves, sun glasses (UVA+UVB protection) and wide brim hats  (4-inch brim around the entire circumference of the hat) are also recommended for sun protection.  - Call for new or changing lesions.  SKIN CANCER SCREENING PERFORMED TODAY.  FAVOR TRAUMATIZED NEVUS  Exam:  Left mid back lateral 1.1 cm medium to light brown thin papule.   Treatment Plan:  Recheck in 2-3 months.  Benign appearing on exam today. Recommend observation. Call clinic for new or changing moles. Recommend daily use of broad spectrum spf 30+ sunscreen to sun-exposed areas.   FAVOR SKIN INFECTION Umbilicus  Treatment: Apply Mupirocin TID.  RTC 4-6 weeks if not resolved.   Return in about 1 year (around 03/21/2024) for TBSE, HxDN.  I, Lawson Radar, CMA, am acting as scribe for Darden Dates, MD.   Documentation: I have reviewed the above documentation for accuracy and completeness, and I agree with the above.  Darden Dates, MD

## 2023-03-26 ENCOUNTER — Other Ambulatory Visit: Payer: Self-pay

## 2023-03-26 ENCOUNTER — Ambulatory Visit (INDEPENDENT_AMBULATORY_CARE_PROVIDER_SITE_OTHER): Payer: 59 | Admitting: Physician Assistant

## 2023-03-26 ENCOUNTER — Encounter (INDEPENDENT_AMBULATORY_CARE_PROVIDER_SITE_OTHER): Payer: Self-pay | Admitting: Physician Assistant

## 2023-03-26 VITALS — BP 119/79 | HR 65 | Temp 97.7°F | Ht 63.0 in | Wt 239.0 lb

## 2023-03-26 DIAGNOSIS — I10 Essential (primary) hypertension: Secondary | ICD-10-CM | POA: Diagnosis not present

## 2023-03-26 DIAGNOSIS — R7303 Prediabetes: Secondary | ICD-10-CM

## 2023-03-26 DIAGNOSIS — E669 Obesity, unspecified: Secondary | ICD-10-CM | POA: Diagnosis not present

## 2023-03-26 DIAGNOSIS — Z6841 Body Mass Index (BMI) 40.0 and over, adult: Secondary | ICD-10-CM | POA: Diagnosis not present

## 2023-03-26 MED ORDER — METFORMIN HCL 500 MG PO TABS
500.0000 mg | ORAL_TABLET | Freq: Two times a day (BID) | ORAL | 0 refills | Status: DC
Start: 2023-03-26 — End: 2023-05-02
  Filled 2023-03-26: qty 60, 30d supply, fill #0

## 2023-03-26 NOTE — Progress Notes (Signed)
.smr  Office: [phone]  /  Fax: [phone]  WEIGHT SUMMARY AND BIOMETRICS  Anthropometric Measurements Height: 5\' 3"  (1.6 m) Weight: 239 lb (108.4 kg) BMI (Calculated): 42.35 Weight at Last Visit: 243 lb Weight Lost Since Last Visit: 4 lb Weight Gained Since Last Visit: 0 lb Starting Weight: 264lb Total Weight Loss (lbs): 24 lb (10.9 kg)   Body Composition  Body Fat %: 49.7 % Fat Mass (lbs): 119 lbs Muscle Mass (lbs): 114.6 lbs Total Body Water (lbs): 84.4 lbs Visceral Fat Rating : 16   Other Clinical Data Fasting: Yes Labs: No Today's Visit #: 12 Starting Date: 05/25/22    Chief Complaint: OBESITY  Amy Mooney is here to discuss her progress with her obesity treatment plan. She is on the the Category 2 Plan and states she is following her eating plan approximately 75 % of the time. She states she is walking 7500 steps minutes 5-6 times per week.  Discussed the use of AI scribe software for clinical note transcription with the patient, who gave verbal consent to proceed.  The patient, under treatment for obesity, prediabetes, vitamin D deficiency, and hypertension, reports a stable appetite with no meal skipping. They note that they often have to remind themselves to eat. The patient is currently on metformin and Zepbound, with two doses of Zepbound 7.5 mg every 10 days remaining.   Bio impedence: Shows decrease in adipose tissue and maintenance of muscle mass. Their visceral adipose rating has also decreased to 16. Exercise- they maintain an active lifestyle, achieving approximately 7500 steps per day. They express difficulty in incorporating strength training exercises into their routine due to long work hours.  Hydration is adequate/ protein intake adequate.   Obesity: Continued weight loss with decrease in adipose tissue and maintenance of muscle mass. Appetite well controlled, no meal skipping. Regular walking exercise, achieving 7500 steps daily. -Continue  current regimen.  Pharmacotherapy: Zepbound 7.5 mg every 10 days. Denies mass in neck, dysphagia, dyspepsia, persistent hoarseness, abdominal pain, or N/V/Constipation or diarrhea. Has annual eye exam. Mood is stable.  Metformin XR 500 mg daily. No GI side effects with Metformin or other side effects.  The patient has expressed some interest in potentially starting a new medication, Qsymia, but is unsure due to previous experiences with similar medications. They have a known allergy to Wellbutrin.  Associated conditions addressed today:  Hypertension Hypertension well controlled, asymptomatic, and no significant medication side effects noted.  Medication(s): HCTZ 25 mg daily  BP Readings from Last 3 Encounters:  03/26/23 119/79  03/05/23 129/77  02/05/23 122/76   Lab Results  Component Value Date   CREATININE 0.85 02/23/2023   CREATININE 0.76 11/03/2022   CREATININE 0.76 05/25/2022   Lab Results  Component Value Date   GFR 87.63 11/03/2022   GFR 73.62 08/27/2020   GFR 84.69 08/11/2019    Plan: Continue all antihypertensives at current dosages. The patient's blood pressure readings have been within normal limits.  Assessment & Plan Obesity: Continued weight loss with decrease in adipose tissue and maintenance of muscle mass. Appetite well controlled, no meal skipping. Regular walking exercise, achieving 7500 steps daily. Continue to work on nutrition plan to promote weight loss and improve BP control.    Prediabetes Last A1c was 6.0  Medication(s): Zepbound 7.5 mg SQ weekly and metformin XR 500 mg every evening. She is taking the Zepbound every 10 days currently as stretching dosing interval as will not refill after these last 2 doses due to no insurance coverage.  Polyphagia:No No side effects with metformin or Zepbound.  She is working on Engineer, technical sales to decrease simple carbohydrates, increase lean proteins and exercise to promote weight loss, improve glycemic control and  prevent progression to Type 2 diabetes.   Lab Results  Component Value Date   HGBA1C 6.0 (H) 02/23/2023   HGBA1C 6.2 11/03/2022   HGBA1C 6.2 (H) 05/25/2022   HGBA1C 5.8 (A) 03/04/2021   HGBA1C 6.1 08/27/2020   Lab Results  Component Value Date   INSULIN 41.4 (H) 05/25/2022   INSULIN 22.8 04/21/2019   INSULIN 25.6 (H) 12/17/2018   INSULIN 29.0 (H) 08/19/2018    Plan: Change to Metformin 500 mg twice daily with meals Continue working on nutrition plan to decrease simple carbohydrates, increase lean proteins and exercise to promote weight loss, improve glycemic control and prevent progression to Type 2 diabetes.   Currently on Metformin XR, with two doses of Zepbound  remaining. No GI upset  Patient has had previous GI upset with non-extended release Metformin. -Increase Metformin to 500 mg twice daily and monitor for GI symptoms.   PHYSICAL EXAM:  Blood pressure 119/79, pulse 65, temperature 97.7 F (36.5 C), height 5\' 3"  (1.6 m), weight 239 lb (108.4 kg), SpO2 98 %. Body mass index is 42.34 kg/m.  General: She is overweight, cooperative, alert, well developed and in NAD.  Psych: Normal mood, affect and thought process.  Cardiovascular: HR 60's regular Lungs- Normal effort, no dyspnea with conversation.  Neuro: no focal deficits.   DIAGNOSTIC DATA REVIEWED:  BMET    Component Value Date/Time   NA 141 02/23/2023 1045   NA 137 05/23/2013 1515   K 4.1 02/23/2023 1045   K 3.9 05/23/2013 1515   CL 101 02/23/2023 1045   CL 104 05/23/2013 1515   CO2 26 02/23/2023 1045   CO2 35 (H) 05/23/2013 1515   GLUCOSE 83 02/23/2023 1045   GLUCOSE 92 11/03/2022 1037   GLUCOSE 68 05/23/2013 1515   BUN 16 02/23/2023 1045   BUN 10 05/23/2013 1515   CREATININE 0.85 02/23/2023 1045   CREATININE 0.86 05/23/2013 1515   CALCIUM 9.4 02/23/2023 1045   CALCIUM 8.4 (L) 05/23/2013 1515   GFRNONAA >60 05/28/2019 0603   GFRNONAA >60 05/23/2013 1515   GFRAA >60 05/28/2019 0603   GFRAA >60  05/23/2013 1515   Lab Results  Component Value Date   HGBA1C 6.0 (H) 02/23/2023   HGBA1C 5.7 03/24/2016   Lab Results  Component Value Date   INSULIN 41.4 (H) 05/25/2022   INSULIN 29.0 (H) 08/19/2018   Lab Results  Component Value Date   TSH 2.820 05/25/2022   CBC    Component Value Date/Time   WBC 6.5 11/03/2022 1037   RBC 5.20 (H) 11/03/2022 1037   HGB 13.9 11/03/2022 1037   HGB 13.0 04/21/2019 1143   HCT 42.4 11/03/2022 1037   HCT 40.1 04/21/2019 1143   PLT 211.0 11/03/2022 1037   PLT 225 11/17/2016 1000   MCV 81.5 11/03/2022 1037   MCV 81 04/21/2019 1143   MCV 83 05/14/2013 1149   MCH 26.6 05/29/2019 0525   MCHC 32.8 11/03/2022 1037   RDW 14.8 11/03/2022 1037   RDW 15.6 (H) 04/21/2019 1143   RDW 14.1 05/14/2013 1149   Iron Studies    Component Value Date/Time   IRON 50 06/24/2014 1542   TIBC 328 06/24/2014 1542   FERRITIN 130 06/24/2014 1542   IRONPCTSAT 15 06/24/2014 1542   Lipid Panel  Component Value Date/Time   CHOL 151 05/25/2022 0828   TRIG 122 05/25/2022 0828   HDL 49 05/25/2022 0828   CHOLHDL 3 08/27/2020 0848   VLDL 26.0 08/27/2020 0848   LDLCALC 80 05/25/2022 0828   Hepatic Function Panel     Component Value Date/Time   PROT 7.1 02/23/2023 1045   ALBUMIN 4.3 02/23/2023 1045   AST 33 02/23/2023 1045   ALT 37 (H) 02/23/2023 1045   ALKPHOS 117 02/23/2023 1045   BILITOT 0.8 02/23/2023 1045      Component Value Date/Time   TSH 2.820 05/25/2022 0828   Nutritional Lab Results  Component Value Date   VD25OH 42.1 02/23/2023   VD25OH 46.5 05/25/2022   VD25OH 50.04 08/27/2020   ATTESTASTION STATEMENTS:   Reviewed by clinician on day of visit: allergies, medications, problem list, medical history, surgical history, family history, social history, and previous encounter notes.    I have personally spent 35 minutes total time today in preparation, patient care, and documentation for this visit, including the following: review of  clinical lab tests; review of medical tests/procedures/services.    She was informed of the importance of frequent follow up visits to maximize her success with intensive lifestyle modifications for her multiple health conditions. Return in about 4 weeks (around 04/23/2023).   Adelita Hone, PA-C

## 2023-05-03 ENCOUNTER — Other Ambulatory Visit: Payer: Self-pay

## 2023-05-03 ENCOUNTER — Ambulatory Visit (INDEPENDENT_AMBULATORY_CARE_PROVIDER_SITE_OTHER): Payer: 59 | Admitting: Physician Assistant

## 2023-05-03 ENCOUNTER — Encounter (INDEPENDENT_AMBULATORY_CARE_PROVIDER_SITE_OTHER): Payer: Self-pay | Admitting: Physician Assistant

## 2023-05-03 VITALS — BP 113/71 | HR 64 | Temp 97.6°F | Ht 63.0 in | Wt 241.0 lb

## 2023-05-03 DIAGNOSIS — R7303 Prediabetes: Secondary | ICD-10-CM

## 2023-05-03 DIAGNOSIS — Z6841 Body Mass Index (BMI) 40.0 and over, adult: Secondary | ICD-10-CM | POA: Diagnosis not present

## 2023-05-03 DIAGNOSIS — I10 Essential (primary) hypertension: Secondary | ICD-10-CM | POA: Diagnosis not present

## 2023-05-03 DIAGNOSIS — E669 Obesity, unspecified: Secondary | ICD-10-CM

## 2023-05-03 MED ORDER — METFORMIN HCL 500 MG PO TABS
500.0000 mg | ORAL_TABLET | Freq: Two times a day (BID) | ORAL | 0 refills | Status: DC
Start: 2023-05-03 — End: 2023-06-13
  Filled 2023-05-03 (×2): qty 60, 30d supply, fill #0

## 2023-05-03 NOTE — Progress Notes (Signed)
.smr  Office: [phone]  /  Fax: [phone]  WEIGHT SUMMARY AND BIOMETRICS  Vitals Temp: 97.6 F (36.4 C) BP: 113/71 Pulse Rate: 64 SpO2: 97 %   Anthropometric Measurements Height: 5\' 3"  (1.6 m) Weight: 241 lb (109.3 kg) BMI (Calculated): 42.7 Weight at Last Visit: 239 lb Weight Lost Since Last Visit: 0 Weight Gained Since Last Visit: 2 Starting Weight: 264 lb Total Weight Loss (lbs): 23 lb (10.4 kg)   Body Composition  Body Fat %: 51.4 % Fat Mass (lbs): 124 lbs Muscle Mass (lbs): 111.2 lbs Total Body Water (lbs): 87.6 lbs Visceral Fat Rating : 17   Other Clinical Data Fasting: yes Labs: no Today's Visit #: 13 Starting Date: 05/25/22     HPI  Chief Complaint: OBESITY  Amy Mooney is here to discuss her progress with her obesity treatment plan. She is on the the Category 2 Plan and states she is following her eating plan approximately 60 % of the time. She states she is exercising 7500 steps (minutes )-7 times per week.   Interval History:  Since last office visit she is up 2 lbs. Bioimpedance scale reviewed with patient: Down 3.2 pounds muscle mass Up 5 pounds adipose mass Up 3.2 pounds total body water  She was on vacation for 12 days and got off track due to lack of schedule, but reports is getting back on track.  Hunger/appetite-fair to moderate control Cravings- denies excessive cravings Stress- Still working at both [employer] and [employer]. Will eventually go back to [employer] only. Increased temptations in [place] office though as lunch brought in daily.   Exercise-7500 steps daily. Tried gym in past, but did not go.  Discussed trying some gentle strengthening exercises 10-15 minutes 2-3 times weekly to work on building muscle mass.     Pharmacotherapy: metformin for primary indication of prediabetes. No GI side effects or other side effects with metformin.  Zepbound discontinued due to no longer covered by insurance.   Has been on  Phentermine in the past and lost weight.  She does have a history of kidney stones.  History of narcolepsy- Not on medication.  Interested in starting phentermine therapy and discussed seeing physician at next visit to initiate phentermine therapy if felt indicated.   TREATMENT PLAN FOR OBESITY:  Recommended Dietary Goals  Amy Mooney is currently in the action stage of change. As such, her goal is to continue weight management plan. She has agreed to the Category 2 Plan.  Behavioral Intervention  We discussed the following Behavioral Modification Strategies today: increasing lean protein intake, decreasing simple carbohydrates , increasing vegetables, increasing lower glycemic fruits, increasing water intake, decreasing eating out or consumption of processed foods, and making healthy choices when eating convenient foods, avoiding temptations and identifying enticing environmental cues, continue to practice mindfulness when eating, and planning for success.  Additional resources provided today: NA  Recommended Physical Activity Goals  Amy Mooney has been advised to work up to 150 minutes of moderate intensity aerobic activity a week and strengthening exercises 2-3 times per week for cardiovascular health, weight loss maintenance and preservation of muscle mass.   She has agreed to Think about ways to increase daily physical activity and overcoming barriers to exercise, Start strengthening exercises with a goal of 2-3 sessions a week , and Increase physical activity in their day and reduce sedentary time (increase NEAT). We discussed some light weight training with a goal of doing this 2-3 times weekly over the next month to see if we  can increase her muscle mass/improve metabolic rate.  Pharmacotherapy We discussed various medication options to help Amy Mooney with her weight loss efforts and we both agreed to continue metformin 500 mg twice daily for primary indication of prediabetes.    Return in  about 4 weeks (around 05/31/2023) for Interested in phentermine therapy.Marland Kitchen She was informed of the importance of frequent follow up visits to maximize her success with intensive lifestyle modifications for her multiple health conditions.  PHYSICAL EXAM:  Blood pressure 113/71, pulse 64, temperature 97.6 F (36.4 C), height 5\' 3"  (1.6 m), weight 241 lb (109.3 kg), SpO2 97%. Body mass index is 42.69 kg/m.  General: She is overweight, cooperative, alert, well developed, and in no acute distress. PSYCH: Has normal mood, affect and thought process.   Cardiovascular: HR 60's regular, BP 113/71 Lungs: Normal breathing effort, no conversational dyspnea. Neuro: no focal deficit  DIAGNOSTIC DATA REVIEWED:  BMET    Component Value Date/Time   NA 141 02/23/2023 1045   NA 137 05/23/2013 1515   K 4.1 02/23/2023 1045   K 3.9 05/23/2013 1515   CL 101 02/23/2023 1045   CL 104 05/23/2013 1515   CO2 26 02/23/2023 1045   CO2 35 (H) 05/23/2013 1515   GLUCOSE 83 02/23/2023 1045   GLUCOSE 92 11/03/2022 1037   GLUCOSE 68 05/23/2013 1515   BUN 16 02/23/2023 1045   BUN 10 05/23/2013 1515   CREATININE 0.85 02/23/2023 1045   CREATININE 0.86 05/23/2013 1515   CALCIUM 9.4 02/23/2023 1045   CALCIUM 8.4 (L) 05/23/2013 1515   GFRNONAA >60 05/28/2019 0603   GFRNONAA >60 05/23/2013 1515   GFRAA >60 05/28/2019 0603   GFRAA >60 05/23/2013 1515   Lab Results  Component Value Date   HGBA1C 6.0 (H) 02/23/2023   HGBA1C 5.7 03/24/2016   Lab Results  Component Value Date   INSULIN 41.4 (H) 05/25/2022   INSULIN 29.0 (H) 08/19/2018   Lab Results  Component Value Date   TSH 2.820 05/25/2022   CBC    Component Value Date/Time   WBC 6.5 11/03/2022 1037   RBC 5.20 (H) 11/03/2022 1037   HGB 13.9 11/03/2022 1037   HGB 13.0 04/21/2019 1143   HCT 42.4 11/03/2022 1037   HCT 40.1 04/21/2019 1143   PLT 211.0 11/03/2022 1037   PLT 225 11/17/2016 1000   MCV 81.5 11/03/2022 1037   MCV 81 04/21/2019 1143   MCV  83 05/14/2013 1149   MCH 26.6 05/29/2019 0525   MCHC 32.8 11/03/2022 1037   RDW 14.8 11/03/2022 1037   RDW 15.6 (H) 04/21/2019 1143   RDW 14.1 05/14/2013 1149   Iron Studies    Component Value Date/Time   IRON 50 06/24/2014 1542   TIBC 328 06/24/2014 1542   FERRITIN 130 06/24/2014 1542   IRONPCTSAT 15 06/24/2014 1542   Lipid Panel     Component Value Date/Time   CHOL 151 05/25/2022 0828   TRIG 122 05/25/2022 0828   HDL 49 05/25/2022 0828   CHOLHDL 3 08/27/2020 0848   VLDL 26.0 08/27/2020 0848   LDLCALC 80 05/25/2022 0828   Hepatic Function Panel     Component Value Date/Time   PROT 7.1 02/23/2023 1045   ALBUMIN 4.3 02/23/2023 1045   AST 33 02/23/2023 1045   ALT 37 (H) 02/23/2023 1045   ALKPHOS 117 02/23/2023 1045   BILITOT 0.8 02/23/2023 1045      Component Value Date/Time   TSH 2.820 05/25/2022 0828   Nutritional Lab Results  Component Value Date   VD25OH 42.1 02/23/2023   VD25OH 46.5 05/25/2022   VD25OH 50.04 08/27/2020    ASSOCIATED CONDITIONS ADDRESSED TODAY  ASSESSMENT AND PLAN  Problem List Items Addressed This Visit     Essential hypertension   Pre-diabetes - Primary   Relevant Medications   metFORMIN (GLUCOPHAGE) 500 MG tablet   Obesity (HCC)- Start BMI 46.77   Relevant Medications   metFORMIN (GLUCOPHAGE) 500 MG tablet   BMI 40.0-44.9, adult (HCC) Current BMI 44.0   Relevant Medications   metFORMIN (GLUCOPHAGE) 500 MG tablet   Prediabetes Last A1c was 6.0/ Insulin 41.4-   Medication(s): Metformin 500 mg twice daily with meals No GI side effects with metformin, but not always taking consistently or twice daily.  Polyphagia:Yes Lab Results  Component Value Date   HGBA1C 6.0 (H) 02/23/2023   HGBA1C 6.2 11/03/2022   HGBA1C 6.2 (H) 05/25/2022   HGBA1C 5.8 (A) 03/04/2021   HGBA1C 6.1 08/27/2020   Lab Results  Component Value Date   INSULIN 41.4 (H) 05/25/2022   INSULIN 22.8 04/21/2019   INSULIN 25.6 (H) 12/17/2018   INSULIN 29.0 (H)  08/19/2018    Plan: Continue and refill Metformin 500 mg twice daily with meals Continue working on nutrition plan to decrease simple carbohydrates, increase lean proteins and exercise to promote weight loss, improve glycemic control and prevent progression to Type 2 diabetes.  Plan to recheck labs in the next 2-3 months.   Hypertension Hypertension stable, asymptomatic, reasonably well controlled, and no significant medication side effects noted.  Medication(s): Hydrochlorothiazide 25 mg daily Renal function is normal  BP Readings from Last 3 Encounters:  05/03/23 113/71  03/26/23 119/79  03/05/23 129/77   Lab Results  Component Value Date   CREATININE 0.85 02/23/2023   CREATININE 0.76 11/03/2022   CREATININE 0.76 05/25/2022   Lab Results  Component Value Date   GFR 87.63 11/03/2022   GFR 73.62 08/27/2020   GFR 84.69 08/11/2019    Plan: Continue hydrochlorothiazide 25 mg daily. Continue to work on nutrition plan to promote weight loss and improve BP control.    ATTESTASTION STATEMENTS:  Reviewed by clinician on day of visit: allergies, medications, problem list, medical history, surgical history, family history, social history, and previous encounter notes.   I have personally spent 40 minutes total time today in preparation, patient care, nutritional counseling and documentation for this visit, including the following: review of clinical lab tests; review of medical tests/procedures/services.      Kendal Raffo, PA-C

## 2023-05-16 ENCOUNTER — Other Ambulatory Visit: Payer: Self-pay | Admitting: Dermatology

## 2023-05-16 ENCOUNTER — Other Ambulatory Visit: Payer: Self-pay

## 2023-05-16 MED ORDER — DOXYCYCLINE MONOHYDRATE 50 MG PO TABS
50.0000 mg | ORAL_TABLET | Freq: Two times a day (BID) | ORAL | 2 refills | Status: DC
  Filled 2023-05-16: qty 60, 30d supply, fill #0
  Filled 2023-06-13: qty 60, 30d supply, fill #1
  Filled 2023-07-20: qty 60, 30d supply, fill #2

## 2023-05-17 ENCOUNTER — Other Ambulatory Visit: Payer: Self-pay

## 2023-06-05 ENCOUNTER — Other Ambulatory Visit: Payer: Self-pay

## 2023-06-05 MED ORDER — VALACYCLOVIR HCL 1 G PO TABS
2000.0000 mg | ORAL_TABLET | ORAL | 11 refills | Status: DC
  Filled 2023-06-05 – 2023-06-13 (×2): qty 30, 7d supply, fill #0
  Filled 2023-09-18: qty 30, 7d supply, fill #1

## 2023-06-07 ENCOUNTER — Ambulatory Visit (INDEPENDENT_AMBULATORY_CARE_PROVIDER_SITE_OTHER): Payer: 59 | Admitting: Family Medicine

## 2023-06-08 ENCOUNTER — Other Ambulatory Visit: Payer: Self-pay

## 2023-06-13 ENCOUNTER — Encounter (INDEPENDENT_AMBULATORY_CARE_PROVIDER_SITE_OTHER): Payer: Self-pay | Admitting: Family Medicine

## 2023-06-13 ENCOUNTER — Ambulatory Visit (INDEPENDENT_AMBULATORY_CARE_PROVIDER_SITE_OTHER): Payer: 59 | Admitting: Family Medicine

## 2023-06-13 ENCOUNTER — Encounter (INDEPENDENT_AMBULATORY_CARE_PROVIDER_SITE_OTHER): Payer: Self-pay

## 2023-06-13 ENCOUNTER — Other Ambulatory Visit: Payer: Self-pay

## 2023-06-13 VITALS — BP 144/80 | HR 72 | Temp 98.1°F | Ht 63.0 in | Wt 246.0 lb

## 2023-06-13 DIAGNOSIS — Z6841 Body Mass Index (BMI) 40.0 and over, adult: Secondary | ICD-10-CM | POA: Diagnosis not present

## 2023-06-13 DIAGNOSIS — E669 Obesity, unspecified: Secondary | ICD-10-CM

## 2023-06-13 DIAGNOSIS — R632 Polyphagia: Secondary | ICD-10-CM | POA: Diagnosis not present

## 2023-06-13 DIAGNOSIS — R7303 Prediabetes: Secondary | ICD-10-CM | POA: Diagnosis not present

## 2023-06-13 MED ORDER — METFORMIN HCL 500 MG PO TABS
500.0000 mg | ORAL_TABLET | Freq: Two times a day (BID) | ORAL | 0 refills | Status: DC
Start: 2023-06-13 — End: 2023-07-17
  Filled 2023-06-13: qty 60, 30d supply, fill #0

## 2023-06-13 MED ORDER — LOMAIRA 8 MG PO TABS
8.0000 mg | ORAL_TABLET | Freq: Two times a day (BID) | ORAL | 0 refills | Status: DC
Start: 2023-06-13 — End: 2023-07-17
  Filled 2023-06-13: qty 60, 30d supply, fill #0

## 2023-06-13 NOTE — Progress Notes (Signed)
Chief Complaint:   OBESITY Amy Mooney is here to discuss her progress with her obesity treatment plan along with follow-up of her obesity related diagnoses. Amy Mooney is on the Category 2 Plan and states she is following her eating plan approximately 60% of the time. Amy Mooney states she is walking 7,500 steps 5 times per week.    Today's visit was #: 14 Starting weight: 264 lbs Starting date: 05/25/2022 Today's weight: 246 lbs Today's date: 06/13/2023 Total lbs lost to date: 18 Total lbs lost since last in-office visit: 0  Interim History: Since last appointment patient has been mostly working. This is patient's first visit with me.  She was feeling better when on GLP-1 therapy- has felt somewhat down due to increase in hunger.  She has noticed more hunger.   Subjective:   1. Polyphagia Patient was previously on Wegovy then Zepbound.  She is not on medications currently.  Controlled substance contract was signed by the patient today, and PDMP was checked.  2. Pre-diabetes Patient's last A1c was 6.0 in May.  No GI side effects of metformin was noted.  Assessment/Plan:   1. Polyphagia Patient agreed to start [name] 8 mg twice daily with no refills.  We will follow-up at her next appointment.  - Phentermine HCl ([name]) 8 MG TABS; Take 1 tablet (8 mg total) by mouth 2 (two) times daily.  Dispense: 60 tablet; Refill: 0  2. Pre-diabetes Patient will continue metformin 500 mg twice daily, and we will refill for 1 month.  - metFORMIN (GLUCOPHAGE) 500 MG tablet; Take 1 tablet (500 mg total) by mouth 2 (two) times daily with a meal.  Dispense: 60 tablet; Refill: 0  3. BMI 40.0-44.9, adult (HCC)  4. Obesity with starting BMI of 43.2 Amy Mooney is currently in the action stage of change. As such, her goal is to continue with weight loss efforts. She has agreed to the Category 2 Plan.   Exercise goals: All adults should avoid inactivity. Some physical activity is better than none, and adults  who participate in any amount of physical activity gain some health benefits.  Behavioral modification strategies: increasing lean protein intake, meal planning and cooking strategies, keeping healthy foods in the home, and planning for success.  Amy Mooney has agreed to follow-up with our clinic in 4 weeks. She was informed of the importance of frequent follow-up visits to maximize her success with intensive lifestyle modifications for her multiple health conditions.   Objective:   Blood pressure (!) 144/80, pulse 72, temperature 98.1 F (36.7 C), height 5\' 3"  (1.6 m), weight 246 lb (111.6 kg), SpO2 99%. Body mass index is 43.58 kg/m.  General: Cooperative, alert, well developed, in no acute distress. HEENT: Conjunctivae and lids unremarkable. Cardiovascular: Regular rhythm.  Lungs: Normal work of breathing. Neurologic: No focal deficits.   Lab Results  Component Value Date   CREATININE 0.85 02/23/2023   BUN 16 02/23/2023   NA 141 02/23/2023   K 4.1 02/23/2023   CL 101 02/23/2023   CO2 26 02/23/2023   Lab Results  Component Value Date   ALT 37 (H) 02/23/2023   AST 33 02/23/2023   ALKPHOS 117 02/23/2023   BILITOT 0.8 02/23/2023   Lab Results  Component Value Date   HGBA1C 6.0 (H) 02/23/2023   HGBA1C 6.2 11/03/2022   HGBA1C 6.2 (H) 05/25/2022   HGBA1C 5.8 (A) 03/04/2021   HGBA1C 6.1 08/27/2020   Lab Results  Component Value Date   INSULIN 41.4 (H) 05/25/2022  INSULIN 22.8 04/21/2019   INSULIN 25.6 (H) 12/17/2018   INSULIN 29.0 (H) 08/19/2018   Lab Results  Component Value Date   TSH 2.820 05/25/2022   Lab Results  Component Value Date   CHOL 151 05/25/2022   HDL 49 05/25/2022   LDLCALC 80 05/25/2022   TRIG 122 05/25/2022   CHOLHDL 3 08/27/2020   Lab Results  Component Value Date   VD25OH 42.1 02/23/2023   VD25OH 46.5 05/25/2022   VD25OH 50.04 08/27/2020   Lab Results  Component Value Date   WBC 6.5 11/03/2022   HGB 13.9 11/03/2022   HCT 42.4  11/03/2022   MCV 81.5 11/03/2022   PLT 211.0 11/03/2022   Lab Results  Component Value Date   IRON 50 06/24/2014   TIBC 328 06/24/2014   FERRITIN 130 06/24/2014   Attestation Statements:   Reviewed by clinician on day of visit: allergies, medications, problem list, medical history, surgical history, family history, social history, and previous encounter notes.   I, Burt Knack, am acting as transcriptionist for Reuben Likes, MD.  I have reviewed the above documentation for accuracy and completeness, and I agree with the above. - Reuben Likes, MD

## 2023-06-14 ENCOUNTER — Other Ambulatory Visit: Payer: Self-pay

## 2023-06-14 ENCOUNTER — Telehealth: Payer: Self-pay

## 2023-06-14 NOTE — Telephone Encounter (Signed)
Received through [employer]: Member should be able to get the drug/product without a PA at this time.

## 2023-07-16 ENCOUNTER — Ambulatory Visit (INDEPENDENT_AMBULATORY_CARE_PROVIDER_SITE_OTHER): Payer: 59 | Admitting: Physician Assistant

## 2023-07-16 NOTE — Progress Notes (Deleted)
.smr  Office: [phone]  /  Fax: [phone]  WEIGHT SUMMARY AND BIOMETRICS  No data recorded No data recorded No data recorded No data recorded   HPI  Chief Complaint: OBESITY  Amy Mooney is here to discuss her progress with her obesity treatment plan. She is on the {MWMwtlossportion/plan2:23431} and states she is following her eating plan approximately *** % of the time. She states she is exercising *** minutes *** times per week.   Interval History:  Since last office visit she *** Hunger/appetite-*** Cravings- *** Stress- *** Sleep- *** Exercise-*** Hydration-***   Pharmacotherapy: ***  TREATMENT PLAN FOR OBESITY:  Recommended Dietary Goals  Amy Mooney is currently in the action stage of change. As such, her goal is to continue weight management plan. She has agreed to {MWMwtlossportion/plan2:23431}.  Behavioral Intervention  We discussed the following Behavioral Modification Strategies today: {EMWMwtlossstrategies:28914::"increasing lean protein intake","decreasing simple carbohydrates ","increasing vegetables","increasing lower glycemic fruits","increasing water intake","continue to practice mindfulness when eating","planning for success"}.  Additional resources provided today: NA  Recommended Physical Activity Goals  Amy Mooney has been advised to work up to 150 minutes of moderate intensity aerobic activity a week and strengthening exercises 2-3 times per week for cardiovascular health, weight loss maintenance and preservation of muscle mass.   She has agreed to {EMEXERCISE:28847::"Think about ways to increase daily physical activity and overcoming barriers to exercise"}   Pharmacotherapy We discussed various medication options to help Amy Mooney with her weight loss efforts and we both agreed to ***.    No follow-ups on file.Marland Kitchen She was informed of the importance of frequent follow up visits to maximize her success with intensive lifestyle modifications for her  multiple health conditions.  PHYSICAL EXAM:  There were no vitals taken for this visit. There is no height or weight on file to calculate BMI.  General: She is overweight, cooperative, alert, well developed, and in no acute distress. PSYCH: Has normal mood, affect and thought process.   Lungs: Normal breathing effort, no conversational dyspnea.  DIAGNOSTIC DATA REVIEWED:  BMET    Component Value Date/Time   NA 141 02/23/2023 1045   NA 137 05/23/2013 1515   K 4.1 02/23/2023 1045   K 3.9 05/23/2013 1515   CL 101 02/23/2023 1045   CL 104 05/23/2013 1515   CO2 26 02/23/2023 1045   CO2 35 (H) 05/23/2013 1515   GLUCOSE 83 02/23/2023 1045   GLUCOSE 92 11/03/2022 1037   GLUCOSE 68 05/23/2013 1515   BUN 16 02/23/2023 1045   BUN 10 05/23/2013 1515   CREATININE 0.85 02/23/2023 1045   CREATININE 0.86 05/23/2013 1515   CALCIUM 9.4 02/23/2023 1045   CALCIUM 8.4 (L) 05/23/2013 1515   GFRNONAA >60 05/28/2019 0603   GFRNONAA >60 05/23/2013 1515   GFRAA >60 05/28/2019 0603   GFRAA >60 05/23/2013 1515   Lab Results  Component Value Date   HGBA1C 6.0 (H) 02/23/2023   HGBA1C 5.7 03/24/2016   Lab Results  Component Value Date   INSULIN 41.4 (H) 05/25/2022   INSULIN 29.0 (H) 08/19/2018   Lab Results  Component Value Date   TSH 2.820 05/25/2022   CBC    Component Value Date/Time   WBC 6.5 11/03/2022 1037   RBC 5.20 (H) 11/03/2022 1037   HGB 13.9 11/03/2022 1037   HGB 13.0 04/21/2019 1143   HCT 42.4 11/03/2022 1037   HCT 40.1 04/21/2019 1143   PLT 211.0 11/03/2022 1037   PLT 225 11/17/2016 1000   MCV 81.5 11/03/2022 1037   MCV  81 04/21/2019 1143   MCV 83 05/14/2013 1149   MCH 26.6 05/29/2019 0525   MCHC 32.8 11/03/2022 1037   RDW 14.8 11/03/2022 1037   RDW 15.6 (H) 04/21/2019 1143   RDW 14.1 05/14/2013 1149   Iron Studies    Component Value Date/Time   IRON 50 06/24/2014 1542   TIBC 328 06/24/2014 1542   FERRITIN 130 06/24/2014 1542   IRONPCTSAT 15 06/24/2014 1542    Lipid Panel     Component Value Date/Time   CHOL 151 05/25/2022 0828   TRIG 122 05/25/2022 0828   HDL 49 05/25/2022 0828   CHOLHDL 3 08/27/2020 0848   VLDL 26.0 08/27/2020 0848   LDLCALC 80 05/25/2022 0828   Hepatic Function Panel     Component Value Date/Time   PROT 7.1 02/23/2023 1045   ALBUMIN 4.3 02/23/2023 1045   AST 33 02/23/2023 1045   ALT 37 (H) 02/23/2023 1045   ALKPHOS 117 02/23/2023 1045   BILITOT 0.8 02/23/2023 1045      Component Value Date/Time   TSH 2.820 05/25/2022 0828   Nutritional Lab Results  Component Value Date   VD25OH 42.1 02/23/2023   VD25OH 46.5 05/25/2022   VD25OH 50.04 08/27/2020    ASSOCIATED CONDITIONS ADDRESSED TODAY  ASSESSMENT AND PLAN  Problem List Items Addressed This Visit     Essential hypertension   Vitamin D deficiency   Pre-diabetes - Primary   Obesity (HCC)- Start BMI 46.77   Other Visit Diagnoses     Polyphagia           ATTESTASTION STATEMENTS:  Reviewed by clinician on day of visit: allergies, medications, problem list, medical history, surgical history, family history, social history, and previous encounter notes.   I have personally spent 30 minutes total time today in preparation, patient care, nutritional counseling and documentation for this visit, including the following: review of clinical lab tests; review of medical tests/procedures/services.      Amy Mccahill, PA-C

## 2023-07-17 ENCOUNTER — Other Ambulatory Visit: Payer: Self-pay

## 2023-07-17 ENCOUNTER — Ambulatory Visit (INDEPENDENT_AMBULATORY_CARE_PROVIDER_SITE_OTHER): Payer: 59 | Admitting: Physician Assistant

## 2023-07-17 ENCOUNTER — Encounter (INDEPENDENT_AMBULATORY_CARE_PROVIDER_SITE_OTHER): Payer: Self-pay | Admitting: Physician Assistant

## 2023-07-17 VITALS — BP 129/74 | HR 70 | Temp 97.7°F | Ht 63.0 in | Wt 240.0 lb

## 2023-07-17 DIAGNOSIS — R7303 Prediabetes: Secondary | ICD-10-CM

## 2023-07-17 DIAGNOSIS — Z6841 Body Mass Index (BMI) 40.0 and over, adult: Secondary | ICD-10-CM | POA: Diagnosis not present

## 2023-07-17 DIAGNOSIS — I10 Essential (primary) hypertension: Secondary | ICD-10-CM | POA: Diagnosis not present

## 2023-07-17 DIAGNOSIS — R632 Polyphagia: Secondary | ICD-10-CM | POA: Diagnosis not present

## 2023-07-17 MED ORDER — METFORMIN HCL 500 MG PO TABS
500.0000 mg | ORAL_TABLET | Freq: Two times a day (BID) | ORAL | 0 refills | Status: DC
Start: 2023-07-17 — End: 2023-08-21
  Filled 2023-07-17: qty 60, 30d supply, fill #0

## 2023-07-17 MED ORDER — LOMAIRA 8 MG PO TABS
8.0000 mg | ORAL_TABLET | Freq: Two times a day (BID) | ORAL | 0 refills | Status: DC
Start: 2023-07-17 — End: 2023-08-21
  Filled 2023-07-17: qty 60, 30d supply, fill #0

## 2023-07-17 NOTE — Progress Notes (Signed)
.smr  Office: [phone]  /  Fax: [phone]  WEIGHT SUMMARY AND BIOMETRICS  Vitals Temp: 97.7 F (36.5 C) BP: 129/74 Pulse Rate: 70 SpO2: 99 %   Anthropometric Measurements Height: 5\' 3"  (1.6 m) Weight: 240 lb (108.9 kg) BMI (Calculated): 42.52 Weight at Last Visit: 245 lb Weight Lost Since Last Visit: 6 lb Weight Gained Since Last Visit: 0 Starting Weight: 264 lb Total Weight Loss (lbs): 24 lb (10.9 kg) Peak Weight: 264 lb   Body Composition  Body Fat %: 50.9 % Fat Mass (lbs): 122.2 lbs Muscle Mass (lbs): 111.8 lbs Total Body Water (lbs): 87.8 lbs Visceral Fat Rating : 17   Other Clinical Data Fasting: yes Labs: no Today's Visit #: 15 Starting Date: 05/25/22     HPI  Chief Complaint: OBESITY  Amy Mooney is here to discuss her progress with her obesity treatment plan. She is on the the Category 2 Plan and states she is following her eating plan approximately 75 % of the time. She states she is exercising walking/strength training 15 minutes 5 times per week.   Interval History:  Since last office visit she is down 6 lbs.   The patient, a 57 year old individual with obesity, presents for a follow-up visit. She has been on [name]  for weight management/metformin for prediabetes/insulin resistance.  The patient reports feeling okay on the medication, but admits to occasionally forgetting to take the second dose of [name] due to work commitments. Despite this, she has managed to lose six pounds of adipose tissue while maintaining muscle mass.  The patient has also incorporated regular exercise into her routine, following a YouTube video about five times a week. The patient recently experienced a significant life event, with their husband being diagnosed with prostate cancer- having surgery soon.  Pharmacotherapy: [name] 8 mg twice daily for appetite suppression. No Side effects. Not consistently taking twice daily, but taking at least in mornings consistently.    Metformin 500 mg BID for prediabetes/insulin resistance.   TREATMENT PLAN FOR OBESITY: Patient is on [name] and Metformin. She reports forgetting to take the [name] twice a day. She has lost six pounds of adipose tissue and maintained muscle mass. She is also exercising regularly. Blood pressure is well controlled on this regimen. -Continue [name] and Metformin. -Encourage consistent use of [name] twice daily. -Continue regular exercise. -Check weight and blood pressure at next visit. Recommended Dietary Goals  Amy Mooney is currently in the action stage of change. As such, her goal is to continue weight management plan. She has agreed to the Category 2 Plan.  Behavioral Intervention  We discussed the following Behavioral Modification Strategies today: increasing lean protein intake, decreasing simple carbohydrates , increasing vegetables, increasing lower glycemic fruits, avoiding skipping meals, increasing water intake, emotional eating strategies and understanding the difference between hunger signals and cravings, work on managing stress, creating time for self-care and relaxation measures, continue to practice mindfulness when eating, and planning for success.  Additional resources provided today: NA  Recommended Physical Activity Goals  Amy Mooney has been advised to work up to 150 minutes of moderate intensity aerobic activity a week and strengthening exercises 2-3 times per week for cardiovascular health, weight loss maintenance and preservation of muscle mass.   She has agreed to Continue current level of physical activity    Pharmacotherapy We discussed various medication options to help Amy Mooney with her weight loss efforts and we both agreed to continue [name] 8 mg twice daily.I have consulted the [place] Controlled Substances Registry for  this patient, and feel the risk/benefit ratio today is favorable for proceeding with this prescription for a controlled substance. No aberrancies  noted. And continue metformin 500 mg twice daily for prediabetes/insulin resistance.     Return in about 5 weeks (around 08/21/2023).Marland Kitchen She was informed of the importance of frequent follow up visits to maximize her success with intensive lifestyle modifications for her multiple health conditions.  PHYSICAL EXAM:  Blood pressure 129/74, pulse 70, temperature 97.7 F (36.5 C), height 5\' 3"  (1.6 m), weight 240 lb (108.9 kg), SpO2 99%. Body mass index is 42.51 kg/m.  General: She is overweight, cooperative, alert, well developed, and in no acute distress. PSYCH: Has normal mood, affect and thought process.   Cardiovascular: HR 70's BP 129/74 Lungs: Normal breathing effort, no conversational dyspnea. Neuro: no focal deficits  DIAGNOSTIC DATA REVIEWED:  BMET    Component Value Date/Time   NA 141 02/23/2023 1045   NA 137 05/23/2013 1515   K 4.1 02/23/2023 1045   K 3.9 05/23/2013 1515   CL 101 02/23/2023 1045   CL 104 05/23/2013 1515   CO2 26 02/23/2023 1045   CO2 35 (H) 05/23/2013 1515   GLUCOSE 83 02/23/2023 1045   GLUCOSE 92 11/03/2022 1037   GLUCOSE 68 05/23/2013 1515   BUN 16 02/23/2023 1045   BUN 10 05/23/2013 1515   CREATININE 0.85 02/23/2023 1045   CREATININE 0.86 05/23/2013 1515   CALCIUM 9.4 02/23/2023 1045   CALCIUM 8.4 (L) 05/23/2013 1515   GFRNONAA >60 05/28/2019 0603   GFRNONAA >60 05/23/2013 1515   GFRAA >60 05/28/2019 0603   GFRAA >60 05/23/2013 1515   Lab Results  Component Value Date   HGBA1C 6.0 (H) 02/23/2023   HGBA1C 5.7 03/24/2016   Lab Results  Component Value Date   INSULIN 41.4 (H) 05/25/2022   INSULIN 29.0 (H) 08/19/2018   Lab Results  Component Value Date   TSH 2.820 05/25/2022   CBC    Component Value Date/Time   WBC 6.5 11/03/2022 1037   RBC 5.20 (H) 11/03/2022 1037   HGB 13.9 11/03/2022 1037   HGB 13.0 04/21/2019 1143   HCT 42.4 11/03/2022 1037   HCT 40.1 04/21/2019 1143   PLT 211.0 11/03/2022 1037   PLT 225 11/17/2016 1000    MCV 81.5 11/03/2022 1037   MCV 81 04/21/2019 1143   MCV 83 05/14/2013 1149   MCH 26.6 05/29/2019 0525   MCHC 32.8 11/03/2022 1037   RDW 14.8 11/03/2022 1037   RDW 15.6 (H) 04/21/2019 1143   RDW 14.1 05/14/2013 1149   Iron Studies    Component Value Date/Time   IRON 50 06/24/2014 1542   TIBC 328 06/24/2014 1542   FERRITIN 130 06/24/2014 1542   IRONPCTSAT 15 06/24/2014 1542   Lipid Panel     Component Value Date/Time   CHOL 151 05/25/2022 0828   TRIG 122 05/25/2022 0828   HDL 49 05/25/2022 0828   CHOLHDL 3 08/27/2020 0848   VLDL 26.0 08/27/2020 0848   LDLCALC 80 05/25/2022 0828   Hepatic Function Panel     Component Value Date/Time   PROT 7.1 02/23/2023 1045   ALBUMIN 4.3 02/23/2023 1045   AST 33 02/23/2023 1045   ALT 37 (H) 02/23/2023 1045   ALKPHOS 117 02/23/2023 1045   BILITOT 0.8 02/23/2023 1045      Component Value Date/Time   TSH 2.820 05/25/2022 0828   Nutritional Lab Results  Component Value Date   VD25OH 42.1 02/23/2023  VD25OH 46.5 05/25/2022   VD25OH 50.04 08/27/2020    ASSOCIATED CONDITIONS ADDRESSED TODAY  ASSESSMENT AND PLAN  Problem List Items Addressed This Visit     Essential hypertension   Pre-diabetes   Relevant Medications   metFORMIN (GLUCOPHAGE) 500 MG tablet   Obesity (HCC)- Start BMI 46.77   Relevant Medications   metFORMIN (GLUCOPHAGE) 500 MG tablet   Phentermine HCl ([name]) 8 MG TABS   BMI 40.0-44.9, adult (HCC) Current BMI 44.0   Relevant Medications   metFORMIN (GLUCOPHAGE) 500 MG tablet   Phentermine HCl ([name]) 8 MG TABS   Polyphagia - Primary   Relevant Medications   Phentermine HCl ([name]) 8 MG TABS  Polyphagia Currently this is well controlled. Medication(s): [name] 8 mg by mouth  BID .  Reported side effects: None  Plan: Continue and refill [name] 8 mg by mouth  BID I have consulted the [place] Controlled Substances Registry for this patient, and feel the risk/benefit ratio today is favorable for  proceeding with this prescription for a controlled substance. No aberrancies noted.  Prediabetes Last A1c was 6.0/ Insulin previously significantly elevated.   Medication(s): Metformin 500 mg twice daily with meals Polyphagia:No Lab Results  Component Value Date   HGBA1C 6.0 (H) 02/23/2023   HGBA1C 6.2 11/03/2022   HGBA1C 6.2 (H) 05/25/2022   HGBA1C 5.8 (A) 03/04/2021   HGBA1C 6.1 08/27/2020   Lab Results  Component Value Date   INSULIN 41.4 (H) 05/25/2022   INSULIN 22.8 04/21/2019   INSULIN 25.6 (H) 12/17/2018   INSULIN 29.0 (H) 08/19/2018    Plan: Continue and refill Metformin 500 mg twice daily with meals Continue working on nutrition plan to decrease simple carbohydrates, increase lean proteins and exercise to promote weight loss, improve glycemic control and prevent progression to Type 2 diabetes.  Recheck A1c over the next 1-2 months.   Hypertension Hypertension asymptomatic, reasonably well controlled, and no significant medication side effects noted.  Medication(s): hydrochlorothiazide 25 mg daily Renal function is normal.   BP Readings from Last 3 Encounters:  07/17/23 129/74  06/13/23 (!) 144/80  05/03/23 113/71   Lab Results  Component Value Date   CREATININE 0.85 02/23/2023   CREATININE 0.76 11/03/2022   CREATININE 0.76 05/25/2022   Lab Results  Component Value Date   GFR 87.63 11/03/2022   GFR 73.62 08/27/2020   GFR 84.69 08/11/2019    Plan: Continue hydrochlorothiazide 25 mg daily.  Continue to work on nutrition plan to promote weight loss and improve BP control.     ATTESTASTION STATEMENTS:  Reviewed by clinician on day of visit: allergies, medications, problem list, medical history, surgical history, family history, social history, and previous encounter notes.   I have personally spent 30 minutes total time today in preparation, patient care, nutritional counseling and documentation for this visit, including the following: review of  clinical lab tests; review of medical tests/procedures/services.      Orlandus Borowski, PA-C

## 2023-08-21 ENCOUNTER — Ambulatory Visit (INDEPENDENT_AMBULATORY_CARE_PROVIDER_SITE_OTHER): Payer: 59 | Admitting: Physician Assistant

## 2023-08-21 ENCOUNTER — Other Ambulatory Visit: Payer: Self-pay

## 2023-08-21 ENCOUNTER — Encounter (INDEPENDENT_AMBULATORY_CARE_PROVIDER_SITE_OTHER): Payer: Self-pay | Admitting: Physician Assistant

## 2023-08-21 VITALS — BP 127/76 | HR 60 | Temp 97.8°F | Ht 63.0 in | Wt 236.0 lb

## 2023-08-21 DIAGNOSIS — Z6841 Body Mass Index (BMI) 40.0 and over, adult: Secondary | ICD-10-CM | POA: Diagnosis not present

## 2023-08-21 DIAGNOSIS — R632 Polyphagia: Secondary | ICD-10-CM

## 2023-08-21 DIAGNOSIS — R7303 Prediabetes: Secondary | ICD-10-CM

## 2023-08-21 DIAGNOSIS — E669 Obesity, unspecified: Secondary | ICD-10-CM

## 2023-08-21 MED ORDER — LOMAIRA 8 MG PO TABS
8.0000 mg | ORAL_TABLET | Freq: Two times a day (BID) | ORAL | 0 refills | Status: DC
  Filled 2023-08-21: qty 60, 30d supply, fill #0

## 2023-08-21 MED ORDER — METFORMIN HCL 500 MG PO TABS
500.0000 mg | ORAL_TABLET | Freq: Two times a day (BID) | ORAL | 0 refills | Status: DC
  Filled 2023-08-21: qty 60, 30d supply, fill #0

## 2023-08-21 NOTE — Progress Notes (Signed)
.smr  Office: [phone]  /  Fax: [phone]  WEIGHT SUMMARY AND BIOMETRICS  Vitals Temp: 97.8 F (36.6 C) BP: 127/76 Pulse Rate: 60 SpO2: 98 %   Anthropometric Measurements Height: 5\' 3"  (1.6 m) Weight: 236 lb (107 kg) BMI (Calculated): 41.82 Weight at Last Visit: 240 lb Weight Lost Since Last Visit: 4 lb Weight Gained Since Last Visit: 0 Starting Weight: 264 lb Total Weight Loss (lbs): 28 lb (12.7 kg) Peak Weight: 264 lb   Body Composition  Body Fat %: 50.8 % Fat Mass (lbs): 120.2 lbs Muscle Mass (lbs): 110.8 lbs Total Body Water (lbs): 86.6 lbs Visceral Fat Rating : 16   Other Clinical Data Fasting: yes Labs: no Today's Visit #: 16 Starting Date: 05/25/22     HPI  Chief Complaint: OBESITY  Amy Mooney is here to discuss her progress with her obesity treatment plan. She is on the the Category 2 Plan and states she is following her eating plan approximately 75 % of the time. She states she is exercising walking/ Youi tube video 30 minutes 5 times per week.   Interval History:  Since last office visit she is down 4 lbs.    The patient, with a history of obesity, presents for a follow-up visit.  Weight loss of four pounds since the last visit and is trying to focus more on her treatment plan.  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 She has backup meals at work in case the provided meals are not healthy.   She reports that she usually has eggs for breakfast and her lunch depends on what is provided at work. Dinner has been inconsistent due to her husband's recent illness and hospitalization. Taking phentermine and metformin as part of her treatment plan.  She also has a history of narcolepsy, which is managed and does not interfere with her sleep.   Pharmacotherapy: [name] 8 mg twice daily for appetite suppression. No Side effects. Not consistently taking twice daily, but taking at least in  mornings consistently.              Metformin 500 mg BID for prediabetes/insulin resistance. No GI or other side effects.   I have consulted the [place] Controlled Substances Registry for this patient, and feel the risk/benefit ratio today is favorable for proceeding with this prescription for a controlled substance. No aberrancies noted.  TREATMENT PLAN FOR OBESITY: Obesity Patient has lost 4 pounds and is maintaining muscle mass. She is taking phentermine 8 mg and metformin 500 mg twice daily. She is trying to focus more on her diet plan and is mindful of avoiding junk food. She is also trying to get enough rest to manage stress. -Continue phentermine and metformin twice daily. -Encouraged to continue focusing on protein intake and mindful eating, especially during the upcoming holiday season. -Check labs including fasting insulin level at next visit to assess the effect of metformin. Recommended Dietary Goals  Vaishnavi is currently in the action stage of change. As such, her goal is to continue weight management plan. She has agreed to the Category 2 Plan.  Behavioral Intervention  We discussed the following Behavioral Modification Strategies today: continue to work on maintaining a reduced calorie state, getting the recommended amount of protein, incorporating whole foods, making healthy choices, staying well hydrated and practicing mindfulness when eating..  Additional resources provided today: NA  Recommended Physical Activity Goals  Tynisha has been advised to work up to  150 minutes of moderate intensity aerobic activity a week and strengthening exercises 2-3 times per week for cardiovascular health, weight loss maintenance and preservation of muscle mass.   She has agreed to Continue current level of physical activity  and Increase physical activity in their day and reduce sedentary time (increase NEAT).   Pharmacotherapy We discussed various medication options to help [facility] with her  weight loss efforts and we both agreed to continue [name] 8 mg BID to promote weight loss and metformin 500 mg BID for prediabetes.    Return in about 4 weeks (around 09/18/2023).Marland Kitchen She was informed of the importance of frequent follow up visits to maximize her success with intensive lifestyle modifications for her multiple health conditions.  PHYSICAL EXAM:  Blood pressure 127/76, pulse 60, temperature 97.8 F (36.6 C), height 5\' 3"  (1.6 m), weight 236 lb (107 kg), SpO2 98%. Body mass index is 41.81 kg/m.  General: She is overweight, cooperative, alert, well developed, and in no acute distress. PSYCH: Has normal mood, affect and thought process.   Cardiovascular: HR 60's BP 127/76 Lungs: Normal breathing effort, no conversational dyspnea. Neuro: no focal deficits  DIAGNOSTIC DATA REVIEWED:  BMET    Component Value Date/Time   NA 141 02/23/2023 1045   NA 137 05/23/2013 1515   K 4.1 02/23/2023 1045   K 3.9 05/23/2013 1515   CL 101 02/23/2023 1045   CL 104 05/23/2013 1515   CO2 26 02/23/2023 1045   CO2 35 (H) 05/23/2013 1515   GLUCOSE 83 02/23/2023 1045   GLUCOSE 92 11/03/2022 1037   GLUCOSE 68 05/23/2013 1515   BUN 16 02/23/2023 1045   BUN 10 05/23/2013 1515   CREATININE 0.85 02/23/2023 1045   CREATININE 0.86 05/23/2013 1515   CALCIUM 9.4 02/23/2023 1045   CALCIUM 8.4 (L) 05/23/2013 1515   GFRNONAA >60 05/28/2019 0603   GFRNONAA >60 05/23/2013 1515   GFRAA >60 05/28/2019 0603   GFRAA >60 05/23/2013 1515   Lab Results  Component Value Date   HGBA1C 6.0 (H) 02/23/2023   HGBA1C 5.7 03/24/2016   Lab Results  Component Value Date   INSULIN 41.4 (H) 05/25/2022   INSULIN 29.0 (H) 08/19/2018   Lab Results  Component Value Date   TSH 2.820 05/25/2022   CBC    Component Value Date/Time   WBC 6.5 11/03/2022 1037   RBC 5.20 (H) 11/03/2022 1037   HGB 13.9 11/03/2022 1037   HGB 13.0 04/21/2019 1143   HCT 42.4 11/03/2022 1037   HCT 40.1 04/21/2019 1143   PLT 211.0  11/03/2022 1037   PLT 225 11/17/2016 1000   MCV 81.5 11/03/2022 1037   MCV 81 04/21/2019 1143   MCV 83 05/14/2013 1149   MCH 26.6 05/29/2019 0525   MCHC 32.8 11/03/2022 1037   RDW 14.8 11/03/2022 1037   RDW 15.6 (H) 04/21/2019 1143   RDW 14.1 05/14/2013 1149   Iron Studies    Component Value Date/Time   IRON 50 06/24/2014 1542   TIBC 328 06/24/2014 1542   FERRITIN 130 06/24/2014 1542   IRONPCTSAT 15 06/24/2014 1542   Lipid Panel     Component Value Date/Time   CHOL 151 05/25/2022 0828   TRIG 122 05/25/2022 0828   HDL 49 05/25/2022 0828   CHOLHDL 3 08/27/2020 0848   VLDL 26.0 08/27/2020 0848   LDLCALC 80 05/25/2022 0828   Hepatic Function Panel     Component Value Date/Time   PROT 7.1 02/23/2023 1045   ALBUMIN 4.3  02/23/2023 1045   AST 33 02/23/2023 1045   ALT 37 (H) 02/23/2023 1045   ALKPHOS 117 02/23/2023 1045   BILITOT 0.8 02/23/2023 1045      Component Value Date/Time   TSH 2.820 05/25/2022 0828   Nutritional Lab Results  Component Value Date   VD25OH 42.1 02/23/2023   VD25OH 46.5 05/25/2022   VD25OH 50.04 08/27/2020    ASSOCIATED CONDITIONS ADDRESSED TODAY  ASSESSMENT AND PLAN  Problem List Items Addressed This Visit     Pre-diabetes - Primary   Relevant Medications   metFORMIN (GLUCOPHAGE) 500 MG tablet   Obesity (HCC)- Start BMI 46.77   Relevant Medications   metFORMIN (GLUCOPHAGE) 500 MG tablet   Phentermine HCl ([name]) 8 MG TABS   BMI 40.0-44.9, adult (HCC) Current BMI 44.0   Relevant Medications   metFORMIN (GLUCOPHAGE) 500 MG tablet   Phentermine HCl ([name]) 8 MG TABS   Polyphagia   Relevant Medications   Phentermine HCl ([name]) 8 MG TABS    Prediabetes Last A1c was 6.0- improved but not at goal. Insulin 41.4 - significantly elevated, not at goal.   Medication(s): Metformin 500 mg twice daily with meals No Gi or other side effects.  Polyphagia:No She is working on a nutrition plan to decrease simple carbohydrates, increase  lean proteins and exercise to promote weight loss, improve glycemic control and prevent progression to Type 2 diabetes.   Lab Results  Component Value Date   HGBA1C 6.0 (H) 02/23/2023   HGBA1C 6.2 11/03/2022   HGBA1C 6.2 (H) 05/25/2022   HGBA1C 5.8 (A) 03/04/2021   HGBA1C 6.1 08/27/2020   Lab Results  Component Value Date   INSULIN 41.4 (H) 05/25/2022   INSULIN 22.8 04/21/2019   INSULIN 25.6 (H) 12/17/2018   INSULIN 29.0 (H) 08/19/2018    Plan: Continue and refill Metformin 500 mg twice daily with meals Continue working on nutrition plan to decrease simple carbohydrates, increase lean proteins and exercise to promote weight loss, improve glycemic control and prevent progression to Type 2 diabetes.  Plan to recheck fasting labs next visit.   Polyphagia Currently this is well to moderately controlled. Medication(s): [name] 8 mg by mouth  twice daily .  Reported side effects: None I have consulted the [place] Controlled Substances Registry for this patient, and feel the risk/benefit ratio today is favorable for proceeding with this prescription for a controlled substance. No aberrancies noted.  Plan: Continue and refill [name] 8 mg by mouth  twice daily.  Continue working on nutrition plan to increase lean protein, decrease simple carbohydrates and exercise to promote weight loss.   General Health Maintenance -Scheduled follow-up appointment on 09/18/2023 with fasting labs.  ATTESTASTION STATEMENTS:  Reviewed by clinician on day of visit: allergies, medications, problem list, medical history, surgical history, family history, social history, and previous encounter notes.   I have personally spent 30 minutes total time today in preparation, patient care, nutritional counseling and documentation for this visit, including the following: review of clinical lab tests; review of medical tests/procedures/services.      Cristy Colmenares, PA-C

## 2023-08-24 ENCOUNTER — Ambulatory Visit (INDEPENDENT_AMBULATORY_CARE_PROVIDER_SITE_OTHER): Payer: 59 | Admitting: Orthopedic Surgery

## 2023-08-24 ENCOUNTER — Encounter: Payer: Self-pay | Admitting: Orthopedic Surgery

## 2023-08-24 ENCOUNTER — Other Ambulatory Visit (INDEPENDENT_AMBULATORY_CARE_PROVIDER_SITE_OTHER): Payer: 59

## 2023-08-24 VITALS — Ht 63.0 in | Wt 238.0 lb

## 2023-08-24 DIAGNOSIS — Z96652 Presence of left artificial knee joint: Secondary | ICD-10-CM

## 2023-08-24 DIAGNOSIS — T84033A Mechanical loosening of internal left knee prosthetic joint, initial encounter: Secondary | ICD-10-CM | POA: Diagnosis not present

## 2023-08-24 DIAGNOSIS — M76892 Other specified enthesopathies of left lower limb, excluding foot: Secondary | ICD-10-CM | POA: Diagnosis not present

## 2023-08-24 DIAGNOSIS — G8929 Other chronic pain: Secondary | ICD-10-CM

## 2023-08-24 DIAGNOSIS — M25562 Pain in left knee: Secondary | ICD-10-CM

## 2023-08-24 MED ORDER — METHYLPREDNISOLONE ACETATE 40 MG/ML IJ SUSP
40.0000 mg | Freq: Once | INTRAMUSCULAR | Status: AC
  Administered 2023-08-24: 40 mg via INTRA_ARTICULAR

## 2023-08-24 NOTE — Progress Notes (Unsigned)
Follow-up visit  Patient had left and right total knee replacements in 2020.  She has had history of bursitis of the left knee secondary to Pez anserine bursitis and tendinitis.  Presents back after falling in September.  After which time she noted worsening knee pain in her left knee swelling along the pes bursa.  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  She has a large amount of swelling over the left pes bursa with tenderness in this area.  She has no palpable tenderness around her knee joint and no effusion there is no erythema the incision looks good  Stability test in extension and flexion were normal  Her range of motion was 0-1 20  Her x-ray shows movement of the tibial tray into varus  Recommend injecting the pes bursitis and getting a consult with the revision specialist to see if this implant needs to be revised  This is a Sigma posterior stabilized DePuy fixed-bearing total knee  Encounter Diagnoses  Name Primary?   Acute pain of left knee    Pes anserinus tendinitis of left lower extremity Yes   S/P TKR (total knee replacement), left    Loosening of prosthesis of left total knee replacement, initial encounter (HCC)      Procedure note Left knee injection for bursitis   verbal consent was obtained to inject Left knee PES BURSA  Timeout was completed to confirm the site of injection  The medications used were 40 mg of Depo-Medrol and 1% lidocaine 3 cc  Anesthesia was provided by ethyl chloride and the skin was prepped with alcohol.  After cleaning the skin with alcohol a 25-gauge needle was used to inject the left knee bursa   There were no complications and a sterile bandage was applied

## 2023-08-28 ENCOUNTER — Encounter (INDEPENDENT_AMBULATORY_CARE_PROVIDER_SITE_OTHER): Payer: Self-pay | Admitting: Physician Assistant

## 2023-08-28 DIAGNOSIS — M25562 Pain in left knee: Secondary | ICD-10-CM | POA: Diagnosis not present

## 2023-08-28 DIAGNOSIS — T8484XD Pain due to internal orthopedic prosthetic devices, implants and grafts, subsequent encounter: Secondary | ICD-10-CM | POA: Diagnosis not present

## 2023-08-30 ENCOUNTER — Other Ambulatory Visit: Payer: Self-pay | Admitting: Orthopedic Surgery

## 2023-08-31 ENCOUNTER — Ambulatory Visit: Payer: 59 | Admitting: Orthopedic Surgery

## 2023-08-31 NOTE — Telephone Encounter (Signed)
Received fax placed in your folder for review.

## 2023-09-03 NOTE — Telephone Encounter (Signed)
Reviewed surgical clearance form.  For her case, she will have presurgical appointment with labs.  [facility].  Reviewed labs from May 2024.  Patient cleared for revision of left knee arthroplasty.  Form completed and placed in Kelli's inbox.

## 2023-09-18 ENCOUNTER — Other Ambulatory Visit: Payer: Self-pay

## 2023-09-18 ENCOUNTER — Encounter (INDEPENDENT_AMBULATORY_CARE_PROVIDER_SITE_OTHER): Payer: Self-pay | Admitting: Physician Assistant

## 2023-09-18 ENCOUNTER — Ambulatory Visit (INDEPENDENT_AMBULATORY_CARE_PROVIDER_SITE_OTHER): Payer: 59 | Admitting: Physician Assistant

## 2023-09-18 VITALS — BP 114/72 | HR 57 | Temp 98.0°F | Ht 63.0 in | Wt 232.0 lb

## 2023-09-18 DIAGNOSIS — R7303 Prediabetes: Secondary | ICD-10-CM | POA: Diagnosis not present

## 2023-09-18 DIAGNOSIS — E669 Obesity, unspecified: Secondary | ICD-10-CM | POA: Diagnosis not present

## 2023-09-18 DIAGNOSIS — R632 Polyphagia: Secondary | ICD-10-CM

## 2023-09-18 DIAGNOSIS — Z6841 Body Mass Index (BMI) 40.0 and over, adult: Secondary | ICD-10-CM

## 2023-09-18 DIAGNOSIS — Z96652 Presence of left artificial knee joint: Secondary | ICD-10-CM | POA: Diagnosis not present

## 2023-09-18 MED ORDER — LOMAIRA 8 MG PO TABS
8.0000 mg | ORAL_TABLET | Freq: Two times a day (BID) | ORAL | 0 refills | Status: DC
  Filled 2023-09-18: qty 60, 30d supply, fill #0

## 2023-09-18 MED ORDER — METFORMIN HCL 500 MG PO TABS
500.0000 mg | ORAL_TABLET | Freq: Two times a day (BID) | ORAL | 0 refills | Status: DC
  Filled 2023-09-18: qty 60, 30d supply, fill #0

## 2023-09-18 NOTE — Progress Notes (Signed)
SUBJECTIVE:  Chief Complaint: Obesity  Interim History: She is down 4 lbs since last visit.  Amy Mooney is a 57 yo female, with a history of prediabetes, hypertension, and obesity, presents for a follow-up visit regarding her obesity treatment plan. Weight loss of four pounds since the last visit and denies any health-related issues. The patient's overall well-being has been influenced by personal stress related to her husband's prostate cancer treatment, but he is doing well and back to work now.  The patient also reports persistent left knee pain with planned revision of left knee replacement, which has been scheduled for surgical intervention on 10/08/23.   In terms of medication, the patient is currently on phentermine and metformin. No issues related to blood pressure have been reported with the use of phentermine.  The patient also mentions a diagnosis of narcolepsy, which has influenced the location of her upcoming surgery. Despite the surgery and the associated recovery period, the patient expresses a desire to maintain her current weight loss progress.  Amy Mooney is here to discuss her progress with her obesity treatment plan. She is on the Category 2 Plan and states she is following her eating plan approximately 75 % of the time. She states she is exercising walking 7500 steps  5 times per week.   Pharmacotherapy: [name] 8 mg twice daily for appetite suppression. No Side effects. Not consistently taking twice daily, but taking at least in mornings consistently.              Metformin 500 mg BID for prediabetes/insulin resistance. No GI or other side effects.    I have consulted the [place] Controlled Substances Registry for this patient, and feel the risk/benefit ratio today is favorable for proceeding with this prescription for a controlled substance. No aberrancies noted. OBJECTIVE: Visit Diagnoses: Problem List Items Addressed This Visit     Pre-diabetes - Primary   Relevant  Medications   metFORMIN (GLUCOPHAGE) 500 MG tablet   Obesity (HCC)- Start BMI 46.77   Relevant Medications   metFORMIN (GLUCOPHAGE) 500 MG tablet   Phentermine HCl ([name]) 8 MG TABS   BMI 40.0-44.9, adult (HCC) Current BMI 44.0   Relevant Medications   metFORMIN (GLUCOPHAGE) 500 MG tablet   Phentermine HCl ([name]) 8 MG TABS   Polyphagia   Relevant Medications   Phentermine HCl ([name]) 8 MG TABS  Obesity with Polyphagia: Down total of 32 lbs TBW loss of 12.12% Started [name] 06/13/23 at 246 lbs. - Down 14 lbs since starting [name] or 5.7% loss since starting [name].  I have consulted the [place] Controlled Substances Registry for this patient, and feel the risk/benefit ratio today is favorable for proceeding with this prescription for a controlled substance. No aberrancies noted.  Lost four pounds and maintained muscle mass. Adhering to dietary strategies and reports feeling well. No issues with blood pressure on phentermine. - Refill phentermine - Schedule fasting labs at the beginning of the year - Continue current dietary strategies    Knee Pain/ History of Left TKA Scheduled for left Total knee revision surgery on December 16th due to a shifted implant. Pre-admission testing on December 6th. Out of work for two weeks post-surgery. Surgery at [facility] due to narcolepsy. Expected to return home the same day. Discussed holding phentermine and vitamins one week before surgery. - Hold phentermine and vitamins one week before surgery    Prediabetes Lab Results  Component Value Date   HGBA1C 6.0 (H) 02/23/2023   HGBA1C 6.2 11/03/2022  HGBA1C 6.2 (H) 05/25/2022   Lab Results  Component Value Date   LDLCALC 80 05/25/2022   CREATININE 0.85 02/23/2023  On metformin 500 mg BID without GI side effects.   Plan: Refill and continue metformin 500 mg BID  A1c/insulin with fasting labs at the beginning of the year. Continue working on nutrition plan to decrease simple  carbohydrates, increase lean proteins and exercise to promote weight loss, improve glycemic control and prevent progression to Type 2 diabetes.   Polyphagia Currently this is well controlled. Medication(s): [name] 8 mg by mouth  BID .  Reported side effects: None  Plan: Continue and refill [name] 8 mg by mouth  BID I have consulted the [place] Controlled Substances Registry for this patient, and feel the risk/benefit ratio today is favorable for proceeding with this prescription for a controlled substance. No aberrancies noted.    General Health Maintenance Annual physical scheduled for January 17th. - Annual physical on January 17th  Follow-up - Pre-admission testing on December 6th - Follow-up visit on December 30th at 2:30 PM.  Vitals Temp: 98 F (36.7 C) BP: 114/72 Pulse Rate: (!) 57 SpO2: 99 %   Anthropometric Measurements Height: 5\' 3"  (1.6 m) Weight: 232 lb (105.2 kg) BMI (Calculated): 41.11 Weight at Last Visit: 240 lb Weight Lost Since Last Visit: 4 lb Weight Gained Since Last Visit: 0 Starting Weight: 264 lb Total Weight Loss (lbs): 32 lb (14.5 kg) Peak Weight: 264 lb   Body Composition  Body Fat %: 49.9 % Fat Mass (lbs): 116 lbs Muscle Mass (lbs): 110.8 lbs Total Body Water (lbs): 83.2 lbs Visceral Fat Rating : 16   Other Clinical Data Fasting: yes Labs: yes Today's Visit #: 17 Starting Date: 05/25/22     ASSESSMENT AND PLAN:  Diet: Amy Mooney is currently in the action stage of change. As such, her goal is to continue with weight loss efforts. She has agreed to Category 2 Plan.  Exercise: Amy Mooney has been instructed to continue exercising as is for weight loss and overall health benefits.   Behavior Modification:  We discussed the following Behavioral Modification Strategies today: increasing lean protein intake, decreasing simple carbohydrates, increasing vegetables, increase H2O intake, increase high fiber foods, no skipping meals, and holiday  eating strategies. We discussed various medication options to help Amy Mooney with her weight loss efforts and we both agreed to continue [name]8 mg twice daily for medical weight loss and metformin 500 mg twice daily for prediabetes.  Return in about 5 weeks (around 10/23/2023).Marland Kitchen She was informed of the importance of frequent follow up visits to maximize her success with intensive lifestyle modifications for her multiple health conditions.  Attestation Statements:   Reviewed by clinician on day of visit: allergies, medications, problem list, medical history, surgical history, family history, social history, and previous encounter notes.   Time spent on visit including pre-visit chart review and post-visit care and charting was 32 minutes.    Shenee Wignall, PA-C

## 2023-09-19 ENCOUNTER — Other Ambulatory Visit (HOSPITAL_COMMUNITY): Payer: 59

## 2023-09-19 DIAGNOSIS — D3191 Benign neoplasm of unspecified part of right eye: Secondary | ICD-10-CM | POA: Diagnosis not present

## 2023-09-19 DIAGNOSIS — H25813 Combined forms of age-related cataract, bilateral: Secondary | ICD-10-CM | POA: Diagnosis not present

## 2023-09-19 DIAGNOSIS — E119 Type 2 diabetes mellitus without complications: Secondary | ICD-10-CM | POA: Diagnosis not present

## 2023-09-19 DIAGNOSIS — H524 Presbyopia: Secondary | ICD-10-CM | POA: Diagnosis not present

## 2023-09-19 DIAGNOSIS — H35033 Hypertensive retinopathy, bilateral: Secondary | ICD-10-CM | POA: Diagnosis not present

## 2023-09-19 LAB — HM DIABETES EYE EXAM

## 2023-09-28 ENCOUNTER — Encounter (HOSPITAL_COMMUNITY): Admission: RE | Admit: 2023-09-28 | Payer: 59 | Source: Ambulatory Visit

## 2023-10-01 NOTE — Patient Instructions (Addendum)
SURGICAL WAITING ROOM VISITATION  Patients having surgery or a procedure may have no more than 2 support people in the waiting area - these visitors may rotate.    Children under the age of 62 must have an adult with them who is not the patient.  Due to an increase in RSV and influenza rates and associated hospitalizations, children ages 45 and under may not visit patients in [facility] hospitals.  If the patient needs to stay at the hospital during part of their recovery, the visitor guidelines for inpatient rooms apply. Pre-op nurse will coordinate an appropriate time for 1 support person to accompany patient in pre-op.  This support person may not rotate.    Please refer to the [facility] website for the visitor guidelines for Inpatients (after your surgery is over and you are in a regular room).       Your procedure is scheduled on: 10/08/23   Report to [facility] Main Entrance    Report to admitting at  5:15 AM   Call this number if you have problems the morning of surgery [phone]   Do not eat food :After Midnight.   After Midnight you may have the following liquids until 4:15 DAY OF SURGERY  Water Non-Citrus Juices (without pulp, NO RED-Apple, White grape, White cranberry) Black Coffee (NO MILK/CREAM OR CREAMERS, sugar ok)  Clear Tea (NO MILK/CREAM OR CREAMERS, sugar ok) regular and decaf                             Plain Jell-O (NO RED)                                           Fruit ices (not with fruit pulp, NO RED)                                     Popsicles (NO RED)                                                               Sports drinks like Gatorade (NO RED)                  The day of surgery:  Drink ONE (1) Pre-Surgery G2 at 4:15 AM the morning of surgery. Drink in one sitting. Do not sip.  This drink was given to you during your hospital  pre-op appointment visit. Nothing else to drink after completing the  Pre-SurgeryG2.       Oral  Hygiene is also important to reduce your risk of infection.                                    Remember - BRUSH YOUR TEETH THE MORNING OF SURGERY WITH YOUR REGULAR TOOTHPASTE   Stop all vitamins and herbal supplements 7 days before surgery.   Take these medicines the morning of surgery with A SIP OF WATER: Loratadine,  DO NOT TAKE ANY ORAL DIABETIC MEDICATIONS  DAY OF YOUR SURGERY HOLD METFORMIN THE DAY OF SURGERY.             You may not have any metal on your body including hair pins, jewelry, and body piercing             Do not wear make-up, lotions, powders, perfumes/cologne, or deodorant  Do not wear nail polish including gel and S&S, artificial/acrylic nails, or any other type of covering on natural nails including finger and toenails. If you have artificial nails, gel coating, etc. that needs to be removed by a nail salon please have this removed prior to surgery or surgery may need to be canceled/ delayed if the surgeon/ anesthesia feels like they are unable to be safely monitored.   Do not shave  48 hours prior to surgery.    Do not bring valuables to the hospital. [facility] IS NOT             RESPONSIBLE   FOR VALUABLES.   Contacts, glasses, dentures or bridgework may not be worn into surgery.  DO NOT BRING YOUR HOME MEDICATIONS TO THE HOSPITAL. PHARMACY WILL DISPENSE MEDICATIONS LISTED ON YOUR MEDICATION LIST TO YOU DURING YOUR ADMISSION IN THE HOSPITAL!    Patients discharged on the day of surgery will not be allowed to drive home.  Someone NEEDS to stay with you for the first 24 hours after anesthesia.   Special Instructions: Bring a copy of your healthcare power of attorney and living will documents the day of surgery if you haven't scanned them before.              Please read over the following fact sheets you were given: IF YOU HAVE QUESTIONS ABOUT YOUR PRE-OP INSTRUCTIONS PLEASE CALL [phone] Amy Mooney   If you received a COVID test during your pre-op visit  it  is requested that you wear a mask when out in public, stay away from anyone that may not be feeling well and notify your surgeon if you develop symptoms. If you test positive for Covid or have been in contact with anyone that has tested positive in the last 10 days please notify you surgeon.      Pre-operative 5 CHG Mooney Instructions   You can play a key role in reducing the risk of infection after surgery. Your skin needs to be as free of germs as possible. You can reduce the number of germs on your skin by washing with CHG (chlorhexidine gluconate) soap before surgery. CHG is an antiseptic soap that kills germs and continues to kill germs even after washing.   DO NOT use if you have an allergy to chlorhexidine/CHG or antibacterial soaps. If your skin becomes reddened or irritated, stop using the CHG and notify one of our RNs at [phone].   Please shower with the CHG soap starting 4 days before surgery using the following schedule:     Please keep in mind the following:  DO NOT shave, including legs and underarms, starting the day of your first shower.   You may shave your face at any point before/day of surgery.  Place clean sheets on your bed the day you start using CHG soap. Use a clean washcloth (not used since being washed) for each shower. DO NOT sleep with pets once you start using the CHG.   CHG Shower Instructions:  If you choose to wash your hair and private area, wash first with your normal shampoo/soap.  After you use shampoo/soap, rinse  your hair and body thoroughly to remove shampoo/soap residue.  Turn the water OFF and apply about 3 tablespoons (45 ml) of CHG soap to a CLEAN washcloth.  Apply CHG soap ONLY FROM YOUR NECK DOWN TO YOUR TOES (washing for 3-5 minutes)  DO NOT use CHG soap on face, private areas, open wounds, or sores.  Pay special attention to the area where your surgery is being performed.  If you are having back surgery, having someone wash your back for  you may be helpful. Wait 2 minutes after CHG soap is applied, then you may rinse off the CHG soap.  Pat dry with a clean towel  Put on clean clothes/pajamas   If you choose to wear lotion, please use ONLY the CHG-compatible lotions on the back of this paper.     Additional instructions for the day of surgery: DO NOT APPLY any lotions, deodorants, cologne, or perfumes.   Put on clean/comfortable clothes.  Brush your teeth.  Ask your nurse before applying any prescription medications to the skin.      CHG Compatible Lotions   Aveeno Moisturizing lotion  Cetaphil Moisturizing Cream  Cetaphil Moisturizing Lotion  Clairol Herbal Essence Moisturizing Lotion, Dry Skin  Clairol Herbal Essence Moisturizing Lotion, Extra Dry Skin  Clairol Herbal Essence Moisturizing Lotion, Normal Skin  Curel Age Defying Therapeutic Moisturizing Lotion with Alpha Hydroxy  Curel Extreme Care Body Lotion  Curel Soothing Hands Moisturizing Hand Lotion  Curel Therapeutic Moisturizing Cream, Fragrance-Free  Curel Therapeutic Moisturizing Lotion, Fragrance-Free  Curel Therapeutic Moisturizing Lotion, Original Formula  Eucerin Daily Replenishing Lotion  Eucerin Dry Skin Therapy Plus Alpha Hydroxy Crme  Eucerin Dry Skin Therapy Plus Alpha Hydroxy Lotion  Eucerin Original Crme  Eucerin Original Lotion  Eucerin Plus Crme Eucerin Plus Lotion  Eucerin TriLipid Replenishing Lotion  Keri Anti-Bacterial Hand Lotion  Keri Deep Conditioning Original Lotion Dry Skin Formula Softly Scented  Keri Deep Conditioning Original Lotion, Fragrance Free Sensitive Skin Formula  Keri Lotion Fast Absorbing Fragrance Free Sensitive Skin Formula  Keri Lotion Fast Absorbing Softly Scented Dry Skin Formula  Keri Original Lotion  Keri Skin Renewal Lotion Keri Silky Smooth Lotion  Keri Silky Smooth Sensitive Skin Lotion  Nivea Body Creamy Conditioning Oil  Nivea Body Extra Enriched Contractor Moisturizing Lotion Nivea Crme  Nivea Skin Firming Lotion  NutraDerm 30 Skin Lotion  NutraDerm Skin Lotion  NutraDerm Therapeutic Skin Cream  NutraDerm Therapeutic Skin Lotion  ProShield Protective Hand Cream   WHAT IS A BLOOD TRANSFUSION? Blood Transfusion Information  A transfusion is the replacement of blood or some of its parts. Blood is made up of multiple cells which provide different functions. Red blood cells carry oxygen and are used for blood loss replacement. White blood cells fight against infection. Platelets control bleeding. Plasma helps clot blood. Other blood products are available for specialized needs, such as hemophilia or other clotting disorders. BEFORE THE TRANSFUSION  Who gives blood for transfusions?  Healthy volunteers who are fully evaluated to make sure their blood is safe. This is blood bank blood. Transfusion therapy is the safest it has ever been in the practice of medicine. Before blood is taken from a donor, a complete history is taken to make sure that person has no history of diseases nor engages in risky social behavior (examples are intravenous drug use or sexual activity with multiple partners). The donor's travel history is screened to minimize risk of transmitting infections, such  as malaria. The donated blood is tested for signs of infectious diseases, such as HIV and hepatitis. The blood is then tested to be sure it is compatible with you in order to minimize the chance of a transfusion reaction. If you or a relative donates blood, this is often done in anticipation of surgery and is not appropriate for emergency situations. It takes many days to process the donated blood. RISKS AND COMPLICATIONS Although transfusion therapy is very safe and saves many lives, the main dangers of transfusion include:  Getting an infectious disease. Developing a transfusion reaction. This is an allergic reaction to something in the blood you were given. Every  precaution is taken to prevent this. The decision to have a blood transfusion has been considered carefully by your caregiver before blood is given. Blood is not given unless the benefits outweigh the risks. AFTER THE TRANSFUSION Right after receiving a blood transfusion, you will usually feel much better and more energetic. This is especially true if your red blood cells have gotten low (anemic). The transfusion raises the level of the red blood cells which carry oxygen, and this usually causes an energy increase. The nurse administering the transfusion will monitor you carefully for complications. HOME CARE INSTRUCTIONS  No special instructions are needed after a transfusion. You may find your energy is better. Speak with your caregiver about any limitations on activity for underlying diseases you may have. SEEK MEDICAL CARE IF:  Your condition is not improving after your transfusion. You develop redness or irritation at the intravenous (IV) site. SEEK IMMEDIATE MEDICAL CARE IF:  Any of the following symptoms occur over the next 12 hours: Shaking chills. You have a temperature by mouth above 102 F (38.9 C), not controlled by medicine. Chest, back, or muscle pain. People around you feel you are not acting correctly or are confused. Shortness of breath or difficulty breathing. Dizziness and fainting. You get a rash or develop hives. You have a decrease in urine output. Your urine turns a dark color or changes to pink, red, or brown. Any of the following symptoms occur over the next 10 days: You have a temperature by mouth above 102 F (38.9 C), not controlled by medicine. Shortness of breath. Weakness after normal activity. The white part of the eye turns yellow (jaundice). You have a decrease in the amount of urine or are urinating less often. Your urine turns a dark color or changes to pink, red, or brown. Document Released: 10/06/2000 Document Revised: 01/01/2012 Document Reviewed:  05/25/2008 [facility] Patient Information 2014 [place], [place].

## 2023-10-01 NOTE — Progress Notes (Signed)
COVID Vaccine received:  []  No [x]  Yes Date of any COVID positive Test in last 90 days: no PCP - Vernona Rieger NP Cardiologist - no  Chest x-ray -  EKG -  10/02/23 Epic Stress Test -  ECHO -  Cardiac Cath -   Bowel Prep - [x]  No  []   Yes ______  Pacemaker / ICD device [x]  No []  Yes   Spinal Cord Stimulator:[x]  No []  Yes       History of Sleep Apnea? []  No [x]  Yes   CPAP used?- [x]  No []  Yes    Does the patient monitor blood sugar?          [x]  No []  Yes  []  N/A  Patient has: [x]  NO Hx DM   []  Pre-DM                 []  DM1  []   DM2 Does patient have a [employer] or Dexacom? []  No []  Yes   Fasting Blood Sugar Ranges-  Checks Blood Sugar _____ times a day  GLP1 agonist / usual dose - no GLP1 instructions:  SGLT-2 inhibitors / usual dose - no SGLT-2 instructions:   Blood Thinner / Instructions:no Aspirin Instructions:no  Comments:   Activity level: Patient is able  to climb a flight of stairs without difficulty; [x]  No CP  [x]  No SOB,  ___   Patient canperform ADLs without assistance.   Anesthesia review:   Patient denies shortness of breath, fever, cough and chest pain at PAT appointment.  Patient verbalized understanding and agreement to the Pre-Surgical Instructions that were given to them at this PAT appointment. Patient was also educated of the need to review these PAT instructions again prior to his/her surgery.I reviewed the appropriate phone numbers to call if they have any and questions or concerns.

## 2023-10-02 ENCOUNTER — Encounter (HOSPITAL_COMMUNITY): Payer: Self-pay

## 2023-10-02 ENCOUNTER — Other Ambulatory Visit: Payer: Self-pay

## 2023-10-02 ENCOUNTER — Encounter (HOSPITAL_COMMUNITY)
Admission: RE | Admit: 2023-10-02 | Discharge: 2023-10-02 | Disposition: A | Discharge status: Home / Self Care | Payer: 59 | Source: Ambulatory Visit | Attending: Orthopedic Surgery

## 2023-10-02 VITALS — BP 152/86 | HR 65 | Temp 97.9°F | Resp 16 | Ht 64.0 in | Wt 240.0 lb

## 2023-10-02 DIAGNOSIS — Z01818 Encounter for other preprocedural examination: Secondary | ICD-10-CM | POA: Diagnosis present

## 2023-10-02 DIAGNOSIS — R9431 Abnormal electrocardiogram [ECG] [EKG]: Secondary | ICD-10-CM | POA: Diagnosis not present

## 2023-10-02 DIAGNOSIS — I10 Essential (primary) hypertension: Secondary | ICD-10-CM | POA: Insufficient documentation

## 2023-10-02 LAB — SURGICAL PCR SCREEN
MRSA, PCR: NEGATIVE
Staphylococcus aureus: NEGATIVE

## 2023-10-02 LAB — BASIC METABOLIC PANEL
Anion gap: 11 (ref 5–15)
BUN: 18 mg/dL (ref 6–20)
CO2: 27 mmol/L (ref 22–32)
Calcium: 9.3 mg/dL (ref 8.9–10.3)
Chloride: 97 mmol/L — ABNORMAL LOW (ref 98–111)
Creatinine, Ser: 0.79 mg/dL (ref 0.44–1.00)
GFR, Estimated: >60 mL/min (ref >60)
Glucose, Bld: 91 mg/dL (ref 70–99)
Potassium: 3.1 mmol/L — ABNORMAL LOW (ref 3.5–5.1)
Sodium: 135 mmol/L (ref 135–145)

## 2023-10-02 LAB — CBC
HCT: 41.9 % (ref 36.0–46.0)
Hemoglobin: 13.6 g/dL (ref 12.0–15.0)
MCH: 26.9 pg (ref 26.0–34.0)
MCHC: 32.5 g/dL (ref 30.0–36.0)
MCV: 82.8 fL (ref 80.0–100.0)
Platelets: 207 10*3/uL (ref 150–400)
RBC: 5.06 MIL/uL (ref 3.87–5.11)
RDW: 14.7 % (ref 11.5–15.5)
WBC: 6.5 10*3/uL (ref 4.0–10.5)
nRBC: 0 % (ref 0.0–0.2)

## 2023-10-03 ENCOUNTER — Encounter: Payer: Self-pay | Admitting: Dermatology

## 2023-10-03 ENCOUNTER — Ambulatory Visit: Payer: 59 | Admitting: Dermatology

## 2023-10-03 DIAGNOSIS — L821 Other seborrheic keratosis: Secondary | ICD-10-CM

## 2023-10-03 DIAGNOSIS — L578 Other skin changes due to chronic exposure to nonionizing radiation: Secondary | ICD-10-CM | POA: Diagnosis not present

## 2023-10-03 DIAGNOSIS — Z86018 Personal history of other benign neoplasm: Secondary | ICD-10-CM

## 2023-10-03 DIAGNOSIS — D485 Neoplasm of uncertain behavior of skin: Secondary | ICD-10-CM

## 2023-10-03 DIAGNOSIS — Z1283 Encounter for screening for malignant neoplasm of skin: Secondary | ICD-10-CM

## 2023-10-03 DIAGNOSIS — L650 Telogen effluvium: Secondary | ICD-10-CM | POA: Diagnosis not present

## 2023-10-03 DIAGNOSIS — D2261 Melanocytic nevi of right upper limb, including shoulder: Secondary | ICD-10-CM | POA: Diagnosis not present

## 2023-10-03 DIAGNOSIS — D225 Melanocytic nevi of trunk: Secondary | ICD-10-CM

## 2023-10-03 DIAGNOSIS — D492 Neoplasm of unspecified behavior of bone, soft tissue, and skin: Secondary | ICD-10-CM

## 2023-10-03 DIAGNOSIS — D229 Melanocytic nevi, unspecified: Secondary | ICD-10-CM

## 2023-10-03 DIAGNOSIS — L814 Other melanin hyperpigmentation: Secondary | ICD-10-CM | POA: Diagnosis not present

## 2023-10-03 DIAGNOSIS — D1801 Hemangioma of skin and subcutaneous tissue: Secondary | ICD-10-CM | POA: Diagnosis not present

## 2023-10-03 DIAGNOSIS — L659 Nonscarring hair loss, unspecified: Secondary | ICD-10-CM

## 2023-10-03 DIAGNOSIS — T84033A Mechanical loosening of internal left knee prosthetic joint, initial encounter: Secondary | ICD-10-CM | POA: Diagnosis present

## 2023-10-03 DIAGNOSIS — W908XXA Exposure to other nonionizing radiation, initial encounter: Secondary | ICD-10-CM | POA: Diagnosis not present

## 2023-10-03 MED ORDER — SAFETY SEAL MISCELLANEOUS MISC: 1.0000 | Freq: Every morning | 5 refills | Status: DC

## 2023-10-03 NOTE — H&P (Signed)
TOTAL KNEE REVISION ADMISSION H&P  Patient is being admitted for left revision total knee arthroplasty.  Subjective:  Chief Complaint:left knee pain.  HPI: Amy Mooney, 57 y.o. female, has a history of pain and functional disability in the left knee(s) due to failed previous arthroplasty and patient has failed non-surgical conservative treatments for greater than 12 weeks to include NSAID's and/or analgesics, flexibility and strengthening excercises, supervised PT with diminished ADL's post treatment, use of assistive devices, and activity modification. The indications for the revision of the total knee arthroplasty are loosening of one or more components. Onset of symptoms was abrupt starting 1 years ago with rapidlly worsening course since that time.  Prior procedures on the left knee(s) include arthroplasty.  Patient currently rates pain in the left knee(s) at 10 out of 10 with activity. There is night pain, worsening of pain with activity and weight bearing, pain that interferes with activities of daily living, and pain with passive range of motion.  Patient has evidence of prosthetic loosening by imaging studies. This condition presents safety issues increasing the risk of falls.   There is no current active infection.  Patient Active Problem List   Diagnosis Date Noted   Loose left total knee arthroplasty (HCC) 10/03/2023   Polyphagia 07/17/2023   Pre-diabetes 01/10/2023   Elevated liver function tests 01/10/2023   Obesity (HCC)- Start BMI 46.77 01/10/2023   BMI 40.0-44.9, adult (HCC) Current BMI 44.0 01/10/2023   H/O cold sores 10/28/2021   Osteoarthritis of both hands 10/28/2021   Tinea of nail 09/03/2020   Varicose veins of lower extremity with pain, bilateral 08/11/2019   Anemia 04/30/2019   Depression 02/03/2019   Insulin resistance 02/03/2019   Vitamin D deficiency 12/17/2018   Other hyperlipidemia 12/17/2018   Essential hypertension 11/26/2018   Class 3 severe obesity with  serious comorbidity and body mass index (BMI) of 40.0 to 44.9 in adult [facility]) 11/26/2018   Colon cancer screening    Polyp of sigmoid colon    Prediabetes 05/04/2017   Obstructive sleep apnea syndrome 05/17/2016   Preventative health care 03/30/2016   Restless leg 03/19/2016   Severe obesity (BMI >= 40) (HCC) 06/24/2014   Migraine without aura 02/21/2013   Narcolepsy without cataplexy 02/21/2013   Past Medical History:  Diagnosis Date   Allergy    Chronic knee pain    arthritis   Class 3 severe obesity with serious comorbidity and body mass index (BMI) of 45.0 to 49.9 in adult [facility]) 06/08/2022   Dysplastic nevus 02/13/2021   right upper back paraspinal, mod to severe atypia    Dysplastic nevus 10/06/2021   left of midline ant base of neck, moderate   Fever blister    history of   Grief reaction 06/07/2018   Heartburn    History of kidney stones    Hypertension    Migraines    Morbid obesity with BMI of 40.0-44.9, adult (HCC)    Narcolepsy without cataplexy(347.00)    Oral herpes    Osteoarthritis    Ovarian torsion 03/22/2017   Palpitations    Pre-diabetes    Primary osteoarthritis of left knee    Restless leg    S/P TKR (total knee replacement), left 05/27/2019 07/10/2019   S/P total knee replacement, right 10/29/18    Sleep apnea    per pt not treated; not able to use CPAP due to claustrophobia   Syncope 11/18/2019   Vitamin D deficiency     Past Surgical History:  Procedure Laterality Date   ABDOMINAL HYSTERECTOMY  2010   artroscopic meniscus repair Bilateral    cervix removed, bladder tack     2015   CESAREAN SECTION     COLONOSCOPY WITH PROPOFOL N/A 06/19/2017   Procedure: COLONOSCOPY WITH PROPOFOL;  Surgeon: Midge Minium, MD;  Location: [facility] ENDOSCOPY;  Service: Endoscopy;  Laterality: N/A;   LAPAROSCOPIC SALPINGO OOPHERECTOMY Right 03/22/2017   Procedure: LAPAROSCOPIC SALPINGO OOPHORECTOMY;  Surgeon: Ward, Elenora Fender, MD;  Location: [facility] ORS;  Service:  Gynecology;  Laterality: Right;   TOTAL KNEE ARTHROPLASTY Right 10/29/2018   Procedure: TOTAL KNEE ARTHROPLASTY;  Surgeon: Vickki Hearing, MD;  Location: [facility] ORS;  Service: Orthopedics;  Laterality: Right;   TOTAL KNEE ARTHROPLASTY Left 05/27/2019   Procedure: TOTAL KNEE ARTHROPLASTY;  Surgeon: Vickki Hearing, MD;  Location: [facility] ORS;  Service: Orthopedics;  Laterality: Left;    No current facility-administered medications for this encounter.   Current Outpatient Medications  Medication Sig Dispense Refill Last Dose   cholecalciferol (VITAMIN D3) 25 MCG (1000 UNIT) tablet Take 1,000 Units by mouth at bedtime.      clobetasol cream (TEMOVATE) 0.05 % Apply 1 Application topically 2 (two) times daily. Avoid applying to face, groin, and axilla. (Patient taking differently: Apply 1 Application topically 2 (two) times daily as needed (Avoid applying to face, groin, and axilla.).) 60 g 2    diclofenac (CATAFLAM) 50 MG tablet Take 1 tablet (50 mg total) by mouth 2 (two) times daily. (Patient taking differently: Take 50 mg by mouth daily.) 90 tablet 3    doxycycline (ADOXA) 50 MG tablet Take 1 tablet (50 mg total) by mouth 2 (two) times daily. (Patient taking differently: Take 50 mg by mouth daily.) 60 tablet 2    hydrochlorothiazide (HYDRODIURIL) 25 MG tablet Take 1 tablet (25 mg total) by mouth daily for blood pressure. 90 tablet 3    loratadine (CLARITIN) 10 MG tablet Take 10 mg by mouth daily as needed for allergies.      metFORMIN (GLUCOPHAGE) 500 MG tablet Take 1 tablet (500 mg total) by mouth 2 (two) times daily with a meal. 60 tablet 0    Phentermine HCl ([name]) 8 MG TABS Take 1 tablet (8 mg total) by mouth 2 (two) times daily. (Patient taking differently: Take 8 mg by mouth daily.) 60 tablet 0    pramipexole (MIRAPEX) 0.25 MG tablet Take 1 tablet (0.25 mg total) by mouth at bedtime. For restless legs. 90 tablet 3    rizatriptan (MAXALT) 10 MG tablet Take 1 tablet by mouth at migraine  onset.  May repeat in 2 hours if needed.  Do not exceed 2 tablets in 24 hours 10 tablet 0    valACYclovir (VALTREX) 1000 MG tablet Take 2 tablets (2,000 mg total) by mouth as directed at onset of blister and 2 tablets 12 hours later 30 tablet 11    Allergies  Allergen Reactions   Armodafinil Hives and Other (See Comments)   Penicillins Other (See Comments) and Rash    Has patient had a PCN reaction causing immediate rash, facial/tongue/throat swelling, SOB or lightheadedness with hypotension: Unknown  Has patient had a PCN reaction causing severe rash involving mucus membranes or skin necrosis: Unknown  Has patient had a PCN reaction that required hospitalization: Unknown  Has patient had a PCN reaction occurring within the last 10 years: Unknown  If all of the above answers are "NO", then may proceed with Cephalosporin use.   Vancomycin Hives  Lyrica [Pregabalin] Hives   Bupropion Other (See Comments)    Causes migraines    Social History   Tobacco Use   Smoking status: Former    Current packs/day: 0.00    Average packs/day: 0.5 packs/day for 4.0 years (2.0 ttl pk-yrs)    Types: Cigarettes    Start date: 10/23/1982    Quit date: 10/23/1986    Years since quitting: 36.9   Smokeless tobacco: Never  Substance Use Topics   Alcohol use: No    Family History  Problem Relation Age of Onset   Breast cancer Mother 59   Diabetes Mother    Depression Mother    Anxiety disorder Mother    Obesity Mother    Breast cancer Paternal Grandmother       Review of Systems  Constitutional: Negative.   HENT: Negative.    Eyes: Negative.   Respiratory: Negative.    Cardiovascular:        HTN  Gastrointestinal: Negative.   Endocrine: Negative.   Genitourinary:        Stones  Musculoskeletal:  Positive for arthralgias.  Skin: Negative.   Allergic/Immunologic: Negative.   Neurological: Negative.   Hematological: Negative.   Psychiatric/Behavioral: Negative.        Objective:  Physical Exam Constitutional:      Appearance: Normal appearance. She is normal weight.  HENT:     Head: Normocephalic and atraumatic.     Nose: Nose normal.  Eyes:     Pupils: Pupils are equal, round, and reactive to light.  Cardiovascular:     Pulses: Normal pulses.  Pulmonary:     Effort: Pulmonary effort is normal.  Musculoskeletal:     Cervical back: Normal range of motion and neck supple.     Comments: Patient walks with a left-sided limp as obvious 10 varus deformity to the left knee tender along the medial joint line 1+ effusion range of motion is actually quite good from 0/115.  Collateral ligaments are stable but varus stress exacerbates her pain.  Skin:    General: Skin is warm and dry.  Neurological:     General: No focal deficit present.     Mental Status: She is alert and oriented to person, place, and time. Mental status is at baseline.  Psychiatric:        Mood and Affect: Mood normal.        Behavior: Behavior normal.        Thought Content: Thought content normal.        Judgment: Judgment normal.     Vital signs in last 24 hours:    Labs:  Estimated body mass index is 41.2 kg/m as calculated from the following:   Height as of 10/02/23: 5\' 4"  (1.626 m).   Weight as of 10/02/23: 108.9 kg.  Imaging Review Radiographs found on the [facility] health PACS system reviewed showing obvious medial subsidence of the tibial implant with probable rim fracture of the medial tibial plateau.  I was able to compare these to x-rays done in December 2023 as well as 2021 when there was no subsidence.    Assessment/Plan:  End stage arthritis, left knee(s) with failed previous arthroplasty.   The patient history, physical examination, clinical judgment of the provider and imaging studies are consistent with end stage degenerative joint disease of the left knee(s), previous total knee arthroplasty. Revision total knee arthroplasty is deemed medically  necessary. The treatment options including medical management, injection therapy, arthroscopy and revision arthroplasty were  discussed at length. The risks and benefits of revision total knee arthroplasty were presented and reviewed. The risks due to aseptic loosening, infection, stiffness, patella tracking problems, thromboembolic complications and other imponderables were discussed. The patient acknowledged the explanation, agreed to proceed with the plan and consent was signed. Patient is being admitted for inpatient treatment for surgery, pain control, PT, OT, prophylactic antibiotics, VTE prophylaxis, progressive ambulation and ADL's and discharge planning.The patient is planning to be discharged home with home health services

## 2023-10-03 NOTE — Progress Notes (Signed)
Follow-Up Visit   Subjective  Amy Mooney is a 57 y.o. female who presents for the following: Skin Cancer Screening and Full Body Skin Exam  The patient presents for Total-Body Skin Exam (TBSE) for skin cancer screening and mole check. The patient has spots, moles and lesions to be evaluated, some may be new or changing and the patient may have concern these could be cancer.    The following portions of the chart were reviewed this encounter and updated as appropriate: medications, allergies, medical history  Review of Systems:  No other skin or systemic complaints except as noted in HPI or Assessment and Plan.  Objective  Well appearing patient in no apparent distress; mood and affect are within normal limits.  A full examination was performed including scalp, head, eyes, ears, nose, lips, neck, chest, axillae, abdomen, back, buttocks, bilateral upper extremities, bilateral lower extremities, hands, feet, fingers, toes, fingernails, and toenails. All findings within normal limits unless otherwise noted below.   Relevant physical exam findings are noted in the Assessment and Plan.  Left Lateral Back 9 mm x 5 mm irregular brown papule R/O DN Left Lateral Chest 1 cm x 5 mm irregular dark brown macule R/O DN Right Upper Arm - Posterior 9 mm x 5  mm irregular brown Papule   Assessment & Plan   SKIN CANCER SCREENING PERFORMED TODAY.  ACTINIC DAMAGE - Chronic condition, secondary to cumulative UV/sun exposure - diffuse scaly erythematous macules with underlying dyspigmentation - Recommend daily broad spectrum sunscreen SPF 30+ to sun-exposed areas, reapply every 2 hours as needed.  - Staying in the shade or wearing long sleeves, sun glasses (UVA+UVB protection) and wide brim hats (4-inch brim around the entire circumference of the hat) are also recommended for sun protection.  - Call for new or changing lesions.  LENTIGINES, SEBORRHEIC KERATOSES, HEMANGIOMAS - Benign normal  skin lesions - Benign-appearing - Call for any changes  MELANOCYTIC NEVI - Tan-brown and/or pink-flesh-colored symmetric macules and papules - Benign appearing on exam today - Observation - Call clinic for new or changing moles - Recommend daily use of broad spectrum spf 30+ sunscreen to sun-exposed areas.       NEOPLASM OF UNCERTAIN BEHAVIOR OF SKIN (3) Left Lateral Back Skin / nail biopsy Type of biopsy: tangential   Informed consent: discussed and consent obtained   Timeout: patient name, date of birth, surgical site, and procedure verified   Procedure prep:  Patient was prepped and draped in usual sterile fashion Prep type:  Isopropyl alcohol Anesthesia: the lesion was anesthetized in a standard fashion   Anesthetic:  1% lidocaine w/ epinephrine 1-100,000 buffered w/ 8.4% NaHCO3 Instrument used: DermaBlade   Hemostasis achieved with: aluminum chloride   Outcome: patient tolerated procedure well   Post-procedure details: sterile dressing applied and wound care instructions given   Dressing type: petrolatum gauze and bandage   Specimen A - Surgical pathology Differential Diagnosis: DN  Check Margins: No Left Lateral Chest Skin / nail biopsy Type of biopsy: tangential   Informed consent: discussed and consent obtained   Timeout: patient name, date of birth, surgical site, and procedure verified   Procedure prep:  Patient was prepped and draped in usual sterile fashion Prep type:  Isopropyl alcohol Anesthesia: the lesion was anesthetized in a standard fashion   Anesthetic:  1% lidocaine w/ epinephrine 1-100,000 buffered w/ 8.4% NaHCO3 Instrument used: DermaBlade   Hemostasis achieved with: aluminum chloride   Outcome: patient tolerated procedure well  Post-procedure details: sterile dressing applied and wound care instructions given   Dressing type: petrolatum gauze and bandage   Specimen B - Surgical pathology Differential Diagnosis: DN  Check Margins: No Right  Upper Arm - Posterior Skin / nail biopsy Type of biopsy: tangential   Informed consent: discussed and consent obtained   Timeout: patient name, date of birth, surgical site, and procedure verified   Procedure prep:  Patient was prepped and draped in usual sterile fashion Prep type:  Isopropyl alcohol Anesthesia: the lesion was anesthetized in a standard fashion   Anesthetic:  1% lidocaine w/ epinephrine 1-100,000 buffered w/ 8.4% NaHCO3 Instrument used: DermaBlade   Hemostasis achieved with: aluminum chloride   Outcome: patient tolerated procedure well   Post-procedure details: sterile dressing applied and wound care instructions given   Dressing type: petrolatum gauze and bandage   Specimen C - Surgical pathology Differential Diagnosis: DN  Check Margins: No TELOGEN EFFLUVIUM   Related Medications Safety Seal Miscellaneous MISC Apply 1 Application topically in the morning. Medication Name: Hormonic Hair Minoxidil 8%, Finasteride 0.05% SKIN EXAM FOR MALIGNANT NEOPLASM   MULTIPLE BENIGN MELANOCYTIC NEVI   CHERRY ANGIOMA   LENTIGINES   ACTINIC SKIN DAMAGE   HISTORY OF DYSPLASTIC NEVUS   ALOPECIA    No follow-ups on file.  1 year  Documentation: I have reviewed the above documentation for accuracy and completeness, and I agree with the above.  Langston Reusing, DO

## 2023-10-03 NOTE — Patient Instructions (Addendum)
Hello Amy Mooney,  Thank you for visiting my office today. Your dedication to addressing your dermatological health is greatly appreciated. Below is a summary of the key instructions and recommendations from today's consultation:  - Medications for Hair Loss:   - Minoxidil and Finasteride: Apply once daily in the morning. A prescription has been sent to [facility] with 4 refills.  - Skin Monitoring and Care:   - Observation: Keep an eye on the white pigmented patch on your chin. Please notify me if there are any changes.   - Nevi and Keratosis Pilaris: No specific treatment is required at this time, as they do not cause you any discomfort.  - Biopsy Procedures:   - Post-Procedure Care: We performed biopsies on a few suspicious spots today. Please apply Aquaphor and a Band-Aid for about a week or two.   - Results: The results should be ready in a week or two. We will contact you if there are any concerns; otherwise, expect a message confirming everything is fine.  Please do not hesitate to reach out if you have any questions or further concerns. I look forward to seeing you at the follow-up.  Best regards,  Dr. Langston Reusing [facility]      Patient Handout: Wound Care for Skin Biopsy Site  Taking Care of Your Skin Biopsy Site  Proper care of the biopsy site is essential for promoting healing and minimizing scarring. This handout provides instructions on how to care for your biopsy site to ensure optimal recovery.  1. Cleaning the Wound:  Clean the biopsy site daily with gentle soap and water. Gently pat the area dry with a clean, soft towel. Avoid harsh scrubbing or rubbing the area, as this can irritate the skin and delay healing.  2. Applying Aquaphor and Bandage:  After cleaning the wound, apply a thin layer of Aquaphor ointment to the biopsy site. Cover the area with a sterile bandage to protect it from dirt, bacteria, and friction. Change the bandage daily or as needed if it  becomes soiled or wet.  3. Continued Care for One Week:  Repeat the cleaning, Aquaphor application, and bandaging process daily for one week following the biopsy procedure. Keeping the wound clean and moist during this initial healing period will help prevent infection and promote optimal healing.  4. Massaging Aquaphor into the Area:  ---After one week, discontinue the use of bandages but continue to apply Aquaphor to the biopsy site. ----Gently massage the Aquaphor into the area using circular motions. ---Massaging the skin helps to promote circulation and prevent the formation of scar tissue.   Additional Tips:  Avoid exposing the biopsy site to direct sunlight during the healing process, as this can cause hyperpigmentation or worsen scarring. If you experience any signs of infection, such as increased redness, swelling, warmth, or drainage from the wound, contact your healthcare provider immediately. Follow any additional instructions provided by your healthcare provider for caring for the biopsy site and managing any discomfort. Conclusion:  Taking proper care of your skin biopsy site is crucial for ensuring optimal healing and minimizing scarring. By following these instructions for cleaning, applying Aquaphor, and massaging the area, you can promote a smooth and successful recovery. If you have any questions or concerns about caring for your biopsy site, don't hesitate to contact your healthcare provider for guidance.    Important Information   Due to recent changes in healthcare laws, you may see results of your pathology and/or laboratory studies on MyChart before the  doctors have had a chance to review them. We understand that in some cases there may be results that are confusing or concerning to you. Please understand that not all results are received at the same time and often the doctors may need to interpret multiple results in order to provide you with the best plan of care or  course of treatment. Therefore, we ask that you please give [name] 2 business days to thoroughly review all your results before contacting the office for clarification. Should we see a critical lab result, you will be contacted sooner.     If You Need Anything After Your Visit   If you have any questions or concerns for your doctor, please call our main line at [phone]. If no one answers, please leave a voicemail as directed and we will return your call as soon as possible. Messages left after 4 pm will be answered the following business day.    You may also send [name] a message via MyChart. We typically respond to MyChart messages within 1-2 business days.  For prescription refills, please ask your pharmacy to contact our office. Our fax number is [phone].  If you have an urgent issue when the clinic is closed that cannot wait until the next business day, you can page your doctor at the number below.     Please note that while we do our best to be available for urgent issues outside of office hours, we are not available 24/7.    If you have an urgent issue and are unable to reach [name], you may choose to seek medical care at your doctor's office, retail clinic, urgent care center, or emergency room.   If you have a medical emergency, please immediately call 911 or go to the emergency department. In the event of inclement weather, please call our main line at [phone] for an update on the status of any delays or closures.  Dermatology Medication Tips: Please keep the boxes that topical medications come in in order to help keep track of the instructions about where and how to use these. Pharmacies typically print the medication instructions only on the boxes and not directly on the medication tubes.   If your medication is too expensive, please contact our office at [phone] or send [name] a message through MyChart.    We are unable to tell what your co-pay for medications will be in  advance as this is different depending on your insurance coverage. However, we may be able to find a substitute medication at lower cost or fill out paperwork to get insurance to cover a needed medication.    If a prior authorization is required to get your medication covered by your insurance company, please allow [name] 1-2 business days to complete this process.   Drug prices often vary depending on where the prescription is filled and some pharmacies may offer cheaper prices.   The website www.goodrx.com contains coupons for medications through different pharmacies. The prices here do not account for what the cost may be with help from insurance (it may be cheaper with your insurance), but the website can give you the price if you did not use any insurance.  - You can print the associated coupon and take it with your prescription to the pharmacy.  - You may also stop by our office during regular business hours and pick up a GoodRx coupon card.  - If you need your prescription sent electronically to a different pharmacy, notify our  office through [facility] or by phone at [phone]

## 2023-10-04 ENCOUNTER — Other Ambulatory Visit: Payer: Self-pay

## 2023-10-04 MED ORDER — DOXYCYCLINE HYCLATE 50 MG PO CAPS
50.0000 mg | ORAL_CAPSULE | Freq: Two times a day (BID) | ORAL | 1 refills | Status: DC
Start: 2023-10-04 — End: 2024-04-02
  Filled 2023-10-04: qty 60, 30d supply, fill #0
  Filled 2023-10-05: qty 120, 60d supply, fill #0
  Filled 2024-01-03: qty 180, 90d supply, fill #1

## 2023-10-04 MED ORDER — VALACYCLOVIR HCL 1 G PO TABS
2000.0000 mg | ORAL_TABLET | ORAL | 11 refills | Status: AC
  Filled 2023-10-04: qty 30, 7d supply, fill #0
  Filled 2024-01-03: qty 30, 7d supply, fill #1

## 2023-10-05 ENCOUNTER — Other Ambulatory Visit: Payer: Self-pay

## 2023-10-05 ENCOUNTER — Other Ambulatory Visit (INDEPENDENT_AMBULATORY_CARE_PROVIDER_SITE_OTHER): Payer: Self-pay

## 2023-10-05 ENCOUNTER — Encounter: Payer: Self-pay | Admitting: Orthopedic Surgery

## 2023-10-05 ENCOUNTER — Ambulatory Visit (INDEPENDENT_AMBULATORY_CARE_PROVIDER_SITE_OTHER): Payer: 59 | Admitting: Orthopedic Surgery

## 2023-10-05 DIAGNOSIS — Z96651 Presence of right artificial knee joint: Secondary | ICD-10-CM

## 2023-10-05 DIAGNOSIS — M1711 Unilateral primary osteoarthritis, right knee: Secondary | ICD-10-CM

## 2023-10-05 NOTE — Progress Notes (Signed)
Follow-up appointment  Encounter Diagnoses  Name Primary?   S/P total knee replacement, right 10/29/2018 Yes   Unilateral primary osteoarthritis, right knee     Chief Complaint  Patient presents with   Post-op Follow-up    Right knee 10/29/2018    Routine annual follow-up right total knee index procedure October 29, 2018  Implant posterior stabilized [employer]  (Left total knee bone loosening scheduled for revision next week) disease  No issues today right knee  Imaging shows no instability normal alignment no loosening  Return 1 year imaging right knee

## 2023-10-06 NOTE — Anesthesia Preprocedure Evaluation (Addendum)
Anesthesia Evaluation  Patient identified by MRN, date of birth, ID band Patient awake    Reviewed: Allergy & Precautions, NPO status , Patient's Chart, lab work & pertinent test results  Airway Mallampati: II  TM Distance: >3 FB Neck ROM: Full    Dental no notable dental hx. (+) Teeth Intact, Dental Advisory Given   Pulmonary sleep apnea (no cpap pt claustrophobic) , former smoker   Pulmonary exam normal breath sounds clear to auscultation       Cardiovascular hypertension, Pt. on medications Normal cardiovascular exam Rhythm:Regular Rate:Normal     Neuro/Psych  Headaches PSYCHIATRIC DISORDERS  Depression       GI/Hepatic Neg liver ROS,,,  Endo/Other    Class 3 obesity  Renal/GU Renal diseaseLab Results      Component                Value               Date                           K                        3.1 (L)             10/02/2023                CO2                      27                  10/02/2023                BUN                      18                  10/02/2023                CREATININE               0.79                10/02/2023                    Musculoskeletal  (+) Arthritis , Osteoarthritis,    Abdominal   Peds  Hematology Lab Results      Component                Value               Date                      WBC                      6.5                 10/02/2023                HGB                      13.6                10/02/2023                HCT  41.9                10/02/2023                MCV                      82.8                10/02/2023                PLT                      207                 10/02/2023              Anesthesia Other Findings All: PCN, Vicodin, Lyrica, Vancomycin  Reproductive/Obstetrics                             Anesthesia Physical Anesthesia Plan  ASA: 3  Anesthesia Plan: Spinal and Regional    Post-op Pain Management: Regional block* and Minimal or no pain anticipated   Induction:   PONV Risk Score and Plan: 2 and Treatment may vary due to age or medical condition, Propofol infusion and Ondansetron  Airway Management Planned: Nasal Cannula and Mask  Additional Equipment: None  Intra-op Plan:   Post-operative Plan:   Informed Consent: I have reviewed the patients History and Physical, chart, labs and discussed the procedure including the risks, benefits and alternatives for the proposed anesthesia with the patient or authorized representative who has indicated his/her understanding and acceptance.     Dental advisory given  Plan Discussed with: CRNA  Anesthesia Plan Comments: (Spinal w L adductor canal block)        Anesthesia Quick Evaluation

## 2023-10-07 MED ORDER — TRANEXAMIC ACID 1000 MG/10ML IV SOLN
2000.0000 mg | INTRAVENOUS | Status: DC
  Filled 2023-10-07: qty 20

## 2023-10-08 ENCOUNTER — Ambulatory Visit (HOSPITAL_COMMUNITY): Payer: Self-pay | Admitting: Anesthesiology

## 2023-10-08 ENCOUNTER — Other Ambulatory Visit (HOSPITAL_COMMUNITY): Payer: Self-pay

## 2023-10-08 ENCOUNTER — Ambulatory Visit (HOSPITAL_BASED_OUTPATIENT_CLINIC_OR_DEPARTMENT_OTHER): Payer: 59 | Admitting: Anesthesiology

## 2023-10-08 ENCOUNTER — Other Ambulatory Visit: Payer: Self-pay

## 2023-10-08 ENCOUNTER — Encounter (HOSPITAL_COMMUNITY): Payer: Self-pay | Admitting: Orthopedic Surgery

## 2023-10-08 ENCOUNTER — Ambulatory Visit (HOSPITAL_COMMUNITY)
Admission: RE | Admit: 2023-10-08 | Discharge: 2023-10-08 | Disposition: A | Discharge status: Home / Self Care | Payer: 59 | Attending: Orthopedic Surgery | Admitting: Orthopedic Surgery

## 2023-10-08 ENCOUNTER — Encounter (HOSPITAL_COMMUNITY)
Admission: RE | Disposition: A | Discharge status: Home / Self Care | Payer: Self-pay | Source: Home / Self Care | Attending: Orthopedic Surgery

## 2023-10-08 DIAGNOSIS — T84033A Mechanical loosening of internal left knee prosthetic joint, initial encounter: Secondary | ICD-10-CM | POA: Diagnosis not present

## 2023-10-08 DIAGNOSIS — Z6841 Body Mass Index (BMI) 40.0 and over, adult: Secondary | ICD-10-CM | POA: Insufficient documentation

## 2023-10-08 DIAGNOSIS — Z7984 Long term (current) use of oral hypoglycemic drugs: Secondary | ICD-10-CM | POA: Insufficient documentation

## 2023-10-08 DIAGNOSIS — Z87891 Personal history of nicotine dependence: Secondary | ICD-10-CM | POA: Insufficient documentation

## 2023-10-08 DIAGNOSIS — T84127A Displacement of internal fixation device of bone of left lower leg, initial encounter: Secondary | ICD-10-CM | POA: Insufficient documentation

## 2023-10-08 DIAGNOSIS — G8918 Other acute postprocedural pain: Secondary | ICD-10-CM | POA: Diagnosis not present

## 2023-10-08 DIAGNOSIS — D649 Anemia, unspecified: Secondary | ICD-10-CM | POA: Diagnosis not present

## 2023-10-08 DIAGNOSIS — Z96652 Presence of left artificial knee joint: Secondary | ICD-10-CM | POA: Diagnosis not present

## 2023-10-08 DIAGNOSIS — Y839 Surgical procedure, unspecified as the cause of abnormal reaction of the patient, or of later complication, without mention of misadventure at the time of the procedure: Secondary | ICD-10-CM | POA: Diagnosis not present

## 2023-10-08 DIAGNOSIS — Z96653 Presence of artificial knee joint, bilateral: Secondary | ICD-10-CM | POA: Diagnosis not present

## 2023-10-08 DIAGNOSIS — M25562 Pain in left knee: Secondary | ICD-10-CM | POA: Diagnosis present

## 2023-10-08 DIAGNOSIS — I10 Essential (primary) hypertension: Secondary | ICD-10-CM | POA: Diagnosis not present

## 2023-10-08 DIAGNOSIS — G4733 Obstructive sleep apnea (adult) (pediatric): Secondary | ICD-10-CM | POA: Insufficient documentation

## 2023-10-08 HISTORY — PX: TOTAL KNEE REVISION: SHX996

## 2023-10-08 LAB — TYPE AND SCREEN
ABO/RH(D): A POS
Antibody Screen: NEGATIVE

## 2023-10-08 LAB — SURGICAL PATHOLOGY

## 2023-10-08 SURGERY — TOTAL KNEE REVISION
Anesthesia: Regional | Site: Knee | Laterality: Left

## 2023-10-08 MED ORDER — BUPIVACAINE LIPOSOME 1.3 % IJ SUSP: 20.0000 mL | Freq: Once | INTRAMUSCULAR | Status: DC

## 2023-10-08 MED ORDER — FENTANYL CITRATE (PF) 100 MCG/2ML IJ SOLN
INTRAMUSCULAR | Status: AC
  Filled 2023-10-08: qty 2

## 2023-10-08 MED ORDER — OXYCODONE-ACETAMINOPHEN 5-325 MG PO TABS
1.0000 | ORAL_TABLET | ORAL | 0 refills | Status: DC | PRN
  Filled 2023-10-08: qty 30, 5d supply, fill #0

## 2023-10-08 MED ORDER — TRANEXAMIC ACID-NACL 1000-0.7 MG/100ML-% IV SOLN: 1000.0000 mg | Freq: Once | INTRAVENOUS | Status: DC

## 2023-10-08 MED ORDER — OXYCODONE HCL 5 MG/5ML PO SOLN: 5.0000 mg | Freq: Once | ORAL | Status: AC | PRN

## 2023-10-08 MED ORDER — HYDROMORPHONE HCL 1 MG/ML IJ SOLN
INTRAMUSCULAR | Status: AC
  Administered 2023-10-08: 0.5 mg via INTRAVENOUS
  Filled 2023-10-08: qty 1

## 2023-10-08 MED ORDER — HYDROMORPHONE HCL 1 MG/ML IJ SOLN
0.2500 mg | INTRAMUSCULAR | Status: DC | PRN
  Administered 2023-10-08: 0.25 mg via INTRAVENOUS
  Administered 2023-10-08: 0.5 mg via INTRAVENOUS

## 2023-10-08 MED ORDER — PROPOFOL 10 MG/ML IV BOLUS
INTRAVENOUS | Status: DC | PRN
  Administered 2023-10-08: 30 mg via INTRAVENOUS
  Administered 2023-10-08: 20 mg via INTRAVENOUS

## 2023-10-08 MED ORDER — TRANEXAMIC ACID 1000 MG/10ML IV SOLN
INTRAVENOUS | Status: DC | PRN
  Administered 2023-10-08: 2000 mg via TOPICAL

## 2023-10-08 MED ORDER — KETOROLAC TROMETHAMINE 30 MG/ML IJ SOLN
INTRAMUSCULAR | Status: AC
  Filled 2023-10-08: qty 1

## 2023-10-08 MED ORDER — ASPIRIN 81 MG PO TBEC
81.0000 mg | DELAYED_RELEASE_TABLET | Freq: Two times a day (BID) | ORAL | 0 refills | Status: DC
  Filled 2023-10-08: qty 60, 30d supply, fill #0

## 2023-10-08 MED ORDER — BUPIVACAINE LIPOSOME 1.3 % IJ SUSP
INTRAMUSCULAR | Status: DC | PRN
  Administered 2023-10-08: 20 mL

## 2023-10-08 MED ORDER — MIDAZOLAM HCL 5 MG/5ML IJ SOLN
INTRAMUSCULAR | Status: DC | PRN
  Administered 2023-10-08 (×2): 1 mg via INTRAVENOUS

## 2023-10-08 MED ORDER — BUPIVACAINE LIPOSOME 1.3 % IJ SUSP
INTRAMUSCULAR | Status: AC
  Filled 2023-10-08: qty 20

## 2023-10-08 MED ORDER — ONDANSETRON HCL 4 MG/2ML IJ SOLN
INTRAMUSCULAR | Status: AC
  Filled 2023-10-08: qty 2

## 2023-10-08 MED ORDER — BUPIVACAINE-EPINEPHRINE 0.25% -1:200000 IJ SOLN
INTRAMUSCULAR | Status: AC
  Filled 2023-10-08: qty 1

## 2023-10-08 MED ORDER — BUPIVACAINE IN DEXTROSE 0.75-8.25 % IT SOLN
INTRATHECAL | Status: DC | PRN
  Administered 2023-10-08: 15 mg via INTRATHECAL

## 2023-10-08 MED ORDER — OXYCODONE HCL 5 MG PO TABS
5.0000 mg | ORAL_TABLET | Freq: Once | ORAL | Status: AC | PRN
  Administered 2023-10-08: 5 mg via ORAL

## 2023-10-08 MED ORDER — TRANEXAMIC ACID-NACL 1000-0.7 MG/100ML-% IV SOLN
1000.0000 mg | INTRAVENOUS | Status: AC
  Administered 2023-10-08: 1000 mg via INTRAVENOUS
  Filled 2023-10-08: qty 100

## 2023-10-08 MED ORDER — CLINDAMYCIN PHOSPHATE 900 MG/50ML IV SOLN
900.0000 mg | Freq: Once | INTRAVENOUS | Status: AC
  Administered 2023-10-08: 900 mg via INTRAVENOUS
  Filled 2023-10-08: qty 50

## 2023-10-08 MED ORDER — SODIUM CHLORIDE (PF) 0.9 % IJ SOLN
INTRAMUSCULAR | Status: AC
  Filled 2023-10-08: qty 20

## 2023-10-08 MED ORDER — STERILE WATER FOR IRRIGATION IR SOLN
Status: DC | PRN
  Administered 2023-10-08: 1000 mL

## 2023-10-08 MED ORDER — CLONIDINE HCL (ANALGESIA) 100 MCG/ML EP SOLN
EPIDURAL | Status: DC | PRN
  Administered 2023-10-08: 100 ug

## 2023-10-08 MED ORDER — KETOROLAC TROMETHAMINE 30 MG/ML IJ SOLN
30.0000 mg | Freq: Once | INTRAMUSCULAR | Status: AC | PRN
  Administered 2023-10-08: 30 mg via INTRAVENOUS

## 2023-10-08 MED ORDER — BUPIVACAINE-EPINEPHRINE 0.25% -1:200000 IJ SOLN
INTRAMUSCULAR | Status: DC | PRN
  Administered 2023-10-08: 20 mL

## 2023-10-08 MED ORDER — PROPOFOL 500 MG/50ML IV EMUL
INTRAVENOUS | Status: AC
  Filled 2023-10-08: qty 50

## 2023-10-08 MED ORDER — EPHEDRINE 5 MG/ML INJ
INTRAVENOUS | Status: AC
  Filled 2023-10-08: qty 5

## 2023-10-08 MED ORDER — LACTATED RINGERS IV SOLN: INTRAVENOUS | Status: DC

## 2023-10-08 MED ORDER — HYDROMORPHONE HCL 1 MG/ML IJ SOLN
INTRAMUSCULAR | Status: AC
  Filled 2023-10-08: qty 1

## 2023-10-08 MED ORDER — PROPOFOL 500 MG/50ML IV EMUL
INTRAVENOUS | Status: DC | PRN
  Administered 2023-10-08: 65 ug/kg/min via INTRAVENOUS

## 2023-10-08 MED ORDER — PROPOFOL 1000 MG/100ML IV EMUL
INTRAVENOUS | Status: AC
  Filled 2023-10-08: qty 100

## 2023-10-08 MED ORDER — TRANEXAMIC ACID-NACL 1000-0.7 MG/100ML-% IV SOLN
INTRAVENOUS | Status: AC
  Filled 2023-10-08: qty 100

## 2023-10-08 MED ORDER — 0.9 % SODIUM CHLORIDE (POUR BTL) OPTIME
TOPICAL | Status: DC | PRN
  Administered 2023-10-08: 1000 mL

## 2023-10-08 MED ORDER — LACTATED RINGERS IV BOLUS
250.0000 mL | Freq: Once | INTRAVENOUS | Status: AC
  Administered 2023-10-08: 250 mL via INTRAVENOUS

## 2023-10-08 MED ORDER — FENTANYL CITRATE (PF) 100 MCG/2ML IJ SOLN
INTRAMUSCULAR | Status: DC | PRN
  Administered 2023-10-08 (×2): 50 ug via INTRAVENOUS

## 2023-10-08 MED ORDER — OXYCODONE HCL 5 MG PO TABS
ORAL_TABLET | ORAL | Status: AC
  Filled 2023-10-08: qty 1

## 2023-10-08 MED ORDER — METHOCARBAMOL 500 MG PO TABS
500.0000 mg | ORAL_TABLET | Freq: Four times a day (QID) | ORAL | 0 refills | Status: DC
  Filled 2023-10-08: qty 120, 30d supply, fill #0

## 2023-10-08 MED ORDER — POVIDONE-IODINE 10 % EX SWAB: 2.0000 | Freq: Once | CUTANEOUS | Status: DC

## 2023-10-08 MED ORDER — CHLORHEXIDINE GLUCONATE 0.12 % MT SOLN
15.0000 mL | Freq: Once | OROMUCOSAL | Status: AC
  Administered 2023-10-08: 15 mL via OROMUCOSAL

## 2023-10-08 MED ORDER — LACTATED RINGERS IV BOLUS
500.0000 mL | Freq: Once | INTRAVENOUS | Status: AC
  Administered 2023-10-08: 500 mL via INTRAVENOUS

## 2023-10-08 MED ORDER — ROPIVACAINE HCL 5 MG/ML IJ SOLN
INTRAMUSCULAR | Status: DC | PRN
  Administered 2023-10-08: 30 mL via PERINEURAL

## 2023-10-08 MED ORDER — EPHEDRINE SULFATE-NACL 50-0.9 MG/10ML-% IV SOSY
PREFILLED_SYRINGE | INTRAVENOUS | Status: DC | PRN
  Administered 2023-10-08: 5 mg via INTRAVENOUS

## 2023-10-08 MED ORDER — LACTATED RINGERS IV SOLN
INTRAVENOUS | Status: DC
Start: 2023-10-08 — End: 2023-10-08

## 2023-10-08 MED ORDER — ONDANSETRON HCL 4 MG/2ML IJ SOLN
INTRAMUSCULAR | Status: DC | PRN
  Administered 2023-10-08: 4 mg via INTRAVENOUS

## 2023-10-08 MED ORDER — ONDANSETRON HCL 4 MG/2ML IJ SOLN: 4.0000 mg | Freq: Once | INTRAMUSCULAR | Status: DC | PRN

## 2023-10-08 MED ORDER — SODIUM CHLORIDE (PF) 0.9 % IJ SOLN
INTRAMUSCULAR | Status: DC | PRN
  Administered 2023-10-08: 70 mL

## 2023-10-08 MED ORDER — PHENYLEPHRINE HCL-NACL 20-0.9 MG/250ML-% IV SOLN
INTRAVENOUS | Status: DC | PRN
  Administered 2023-10-08: 35 ug/min via INTRAVENOUS

## 2023-10-08 MED ORDER — SODIUM CHLORIDE (PF) 0.9 % IJ SOLN
INTRAMUSCULAR | Status: AC
  Filled 2023-10-08: qty 50

## 2023-10-08 MED ORDER — MIDAZOLAM HCL 2 MG/2ML IJ SOLN
INTRAMUSCULAR | Status: AC
  Filled 2023-10-08: qty 2

## 2023-10-08 MED ORDER — ORAL CARE MOUTH RINSE: 15.0000 mL | Freq: Once | OROMUCOSAL | Status: AC

## 2023-10-08 SURGICAL SUPPLY — 54 items
BAG COUNTER SPONGE SURGICOUNT (BAG) IMPLANT
BAG DECANTER FOR FLEXI CONT (MISCELLANEOUS) ×1 IMPLANT
BAG ZIPLOCK 12X15 (MISCELLANEOUS) ×1 IMPLANT
BLADE SAG 18X100X1.27 (BLADE) ×1 IMPLANT
BLADE SAW SGTL 11.0X1.19X90.0M (BLADE) ×1 IMPLANT
BLADE SAW SGTL 81X20 HD (BLADE) ×1 IMPLANT
BLADE SURG SZ10 CARB STEEL (BLADE) ×2 IMPLANT
BLADE SW THK.38XMED LNG THN (BLADE) ×2 IMPLANT
BNDG ELASTIC 6X10 VLCR STRL LF (GAUZE/BANDAGES/DRESSINGS) ×2 IMPLANT
BOWL SMART MIX CTS (DISPOSABLE) ×1 IMPLANT
BRUSH FEMORAL CANAL (MISCELLANEOUS) ×1 IMPLANT
BUR MICRO 7.0 ROUND (BURR) IMPLANT
BUR OVAL CARBIDE 4.0 (BURR) IMPLANT
CEMENT HV SMART SET (Cement) ×2 IMPLANT
COVER SURGICAL LIGHT HANDLE (MISCELLANEOUS) ×1 IMPLANT
CUFF TRNQT CYL 34X4.125X (TOURNIQUET CUFF) ×1 IMPLANT
DRAPE INCISE IOBAN 66X45 STRL (DRAPES) IMPLANT
DRAPE SURG ORHT 6 SPLT 77X108 (DRAPES) IMPLANT
DRAPE U-SHAPE 47X51 STRL (DRAPES) ×1 IMPLANT
DRESSING AQUACEL AG SP 3.5X10 (GAUZE/BANDAGES/DRESSINGS) IMPLANT
DRSG AQUACEL AG ADV 3.5X10 (GAUZE/BANDAGES/DRESSINGS) IMPLANT
DRSG AQUACEL AG ADV 3.5X14 (GAUZE/BANDAGES/DRESSINGS) IMPLANT
DRSG AQUACEL AG SP 3.5X10 (GAUZE/BANDAGES/DRESSINGS) ×1
DURAPREP 26ML APPLICATOR (WOUND CARE) ×1 IMPLANT
ELECT REM PT RETURN 15FT ADLT (MISCELLANEOUS) ×1 IMPLANT
GLOVE BIO SURGEON STRL SZ7.5 (GLOVE) ×1 IMPLANT
GLOVE BIO SURGEON STRL SZ8.5 (GLOVE) ×1 IMPLANT
GLOVE BIOGEL PI IND STRL 8 (GLOVE) ×1 IMPLANT
GLOVE BIOGEL PI IND STRL 9 (GLOVE) ×1 IMPLANT
GOWN STRL REUS W/ TWL XL LVL3 (GOWN DISPOSABLE) ×2 IMPLANT
HOOD PEEL AWAY T7 (MISCELLANEOUS) ×3 IMPLANT
INSERT PFC SIG STB SZ3 15.0MM (Knees) IMPLANT
KIT TURNOVER KIT A (KITS) IMPLANT
NDL HYPO 21X1.5 SAFETY (NEEDLE) ×2 IMPLANT
NEEDLE HYPO 21X1.5 SAFETY (NEEDLE) ×2 IMPLANT
NS IRRIG 1000ML POUR BTL (IV SOLUTION) ×1 IMPLANT
PACK TOTAL KNEE CUSTOM (KITS) ×1 IMPLANT
PIN STEINMAN FIXATION KNEE (PIN) IMPLANT
PROTECTOR NERVE ULNAR (MISCELLANEOUS) ×1 IMPLANT
SET HNDPC FAN SPRY TIP SCT (DISPOSABLE) ×1 IMPLANT
SPIKE FLUID TRANSFER (MISCELLANEOUS) ×3 IMPLANT
STAPLER SKIN PROX WIDE 3.9 (STAPLE) IMPLANT
STEM REV 115X14 (Stem) IMPLANT
SUT VIC AB 1 CTX36XBRD ANBCTR (SUTURE) ×1 IMPLANT
SUT VIC AB 2-0 CT1 TAPERPNT 27 (SUTURE) IMPLANT
SUT VICRYL+ 3-0 36IN CT-1 (SUTURE) ×2 IMPLANT
SWAB COLLECTION DEVICE MRSA (MISCELLANEOUS) IMPLANT
SWAB CULTURE ESWAB REG 1ML (MISCELLANEOUS) IMPLANT
SYR CONTROL 10ML LL (SYRINGE) ×2 IMPLANT
TRAY FOLEY MTR SLVR 16FR STAT (SET/KITS/TRAYS/PACK) ×1 IMPLANT
TRAY REVISION SZ 3 (Knees) IMPLANT
TUBE SUCTION HIGH CAP CLEAR NV (SUCTIONS) ×1 IMPLANT
WATER STERILE IRR 1000ML POUR (IV SOLUTION) ×2 IMPLANT
WRAP KNEE MAXI GEL POST OP (GAUZE/BANDAGES/DRESSINGS) ×1 IMPLANT

## 2023-10-08 NOTE — Anesthesia Procedure Notes (Signed)
Procedure Name: MAC Date/Time: 10/08/2023 7:25 AM  Performed by: Orest Dikes, CRNAPre-anesthesia Checklist: Patient identified, Emergency Drugs available, Suction available and Patient being monitored Oxygen Delivery Method: Simple face mask

## 2023-10-08 NOTE — Anesthesia Procedure Notes (Addendum)
Spinal  Patient location during procedure: OR Start time: 10/08/2023 7:25 AM End time: 10/08/2023 7:32 AM Reason for block: surgical anesthesia Staffing Performed: anesthesiologist  Anesthesiologist: Trevor Iha, MD Performed by: Trevor Iha, MD Authorized by: Trevor Iha, MD   Preanesthetic Checklist Completed: patient identified, IV checked, risks and benefits discussed, surgical consent, monitors and equipment checked, pre-op evaluation and timeout performed Spinal Block Patient position: sitting Prep: DuraPrep and site prepped and draped Patient monitoring: heart rate, cardiac monitor, continuous pulse ox and blood pressure Approach: midline Location: L3-4 Injection technique: single-shot Needle Needle type: Pencan  Needle gauge: 24 G Needle length: 10 cm Needle insertion depth: 7 cm Assessment Sensory level: T4 Events: CSF return Additional Notes  1 Attempt (s). Pt tolerated procedure well.

## 2023-10-08 NOTE — Anesthesia Postprocedure Evaluation (Signed)
Anesthesia Post Note  Patient: Amy Mooney  Procedure(s) Performed: REVISION LEFT KNEE ARTHROPLASTY (Left: Knee)     Patient location during evaluation: Nursing Unit Anesthesia Type: Regional and Spinal Level of consciousness: oriented and awake and alert Pain management: pain level controlled Vital Signs Assessment: post-procedure vital signs reviewed and stable Respiratory status: spontaneous breathing and respiratory function stable Cardiovascular status: blood pressure returned to baseline and stable Postop Assessment: no headache, no backache, no apparent nausea or vomiting and patient able to bend at knees Anesthetic complications: no   No notable events documented.  Last Vitals:  Vitals:   10/08/23 1330 10/08/23 1345  BP: 96/61 (!) 89/61  Pulse: (!) 54 (!) 50  Resp:    Temp:    SpO2: 95% 95%    Last Pain:  Vitals:   10/08/23 1300  TempSrc:   PainSc: 0-No pain                 Trevor Iha

## 2023-10-08 NOTE — Evaluation (Signed)
Physical Therapy Evaluation Patient Details Name: Amy Mooney MRN: [id number] DOB: [birth year] Today's Date: 10/08/2023  History of Present Illness  57 yo female presents to therapy s/p L TKA revision due to aseptic loosening of tibial implant and failure of conservative measures. Pt PMH includes but is not limited to: B LE vericose veins, anemia, depression, HTN, OSA, RLS, migraine, dysplastic nevus, kidney stones, narcolepsy, L TKA (2020) and R TKA (2020).  Clinical Impression      Amy Mooney is a 57 y.o. female POD 0 s/p L TKA revision. Patient reports occasional use of SPC with mobility at baseline. Patient is now limited by functional impairments (see PT problem list below) and requires min A for bed mobility, CGA  for transfers and gait with RW. Patient was able to ambulate 45 feet x2 with RW and CGA and cues for safe walker management. Patient educated on safe sequencing for stair mobility with use of HHA and SPC, fall risk prevention, pain management and goal, use of CP/ice and car transfers pt and spouse verbalized understanding of  safe guarding position for people assisting with mobility. Patient instructed in exercises to facilitate ROM and circulation reviewed and HO provided. Pt indicated pain escalated with LE TE 9/10 and became tearful, pt motivated to return home and stated able to manage pain in home setting. Patient will benefit from continued skilled PT interventions to address impairments and progress towards PLOF. Patient has met mobility goals at adequate level for discharge home with family support and OPPT services; will continue to follow if pt continues acute stay to progress towards Mod I goals.     If plan is discharge home, recommend the following: A little help with walking and/or transfers;A little help with bathing/dressing/bathroom;Assistance with cooking/housework;Assist for transportation;Help with stairs or ramp for entrance   Can travel by private vehicle         Equipment Recommendations None recommended by PT  Recommendations for Other Services       Functional Status Assessment Patient has had a recent decline in their functional status and demonstrates the ability to make significant improvements in function in a reasonable and predictable amount of time.     Precautions / Restrictions Precautions Precautions: Knee;Fall Restrictions Weight Bearing Restrictions Per Provider Order: No      Mobility  Bed Mobility Overal bed mobility: Needs Assistance Bed Mobility: Supine to Sit     Supine to sit: Min assist, HOB elevated, Used rails     General bed mobility comments: min A for L LE to EOB, increased time and cues    Transfers Overall transfer level: Needs assistance Equipment used: Rolling walker (2 wheels) Transfers: Sit to/from Stand Sit to Stand: Contact guard assist           General transfer comment: min cues for safety and RW and UE placement for bed, recliner and commode transfers    Ambulation/Gait Ambulation/Gait assistance: Contact guard assist Gait Distance (Feet): 45 Feet Assistive device: Rolling walker (2 wheels) Gait Pattern/deviations: Step-to pattern, Antalgic, Trunk flexed Gait velocity: decreased     General Gait Details: min cues for safety and Rw management  Stairs Stairs: Yes Stairs assistance: Contact guard assist Stair Management: Two rails Number of Stairs: 2 General stair comments: step navigation instruction initally provided with B handrail and then progressed to HHA and use of SPC, pt required CGA to ascend steps and min A to descend steps, pt and family ed provided with spouse indicating comfortable assisting and  verbal udnerstanding, pt and spouse report that it will easier to enter home from garage vs back of home with ramp  Wheelchair Mobility     Tilt Bed    Modified Rankin (Stroke Patients Only)       Balance Overall balance assessment: Needs assistance, History  of Falls (1 fall past 6 months) Sitting-balance support: Feet supported Sitting balance-Leahy Scale: Good     Standing balance support: Bilateral upper extremity supported, During functional activity, Reliant on assistive device for balance Standing balance-Leahy Scale: Fair Standing balance comment: static standing without UE support                             Pertinent Vitals/Pain Pain Assessment Pain Assessment: 0-10 Pain Score: 9  (1/10 at rest prior to mobility) Pain Location: L knee and LE Pain Descriptors / Indicators: Aching, Dull, Operative site guarding Pain Intervention(s): Limited activity within patient's tolerance, Premedicated before session, Monitored during session, Repositioned, Ice applied    Home Living Family/patient expects to be discharged to:: Private residence Living Arrangements: Spouse/significant other Available Help at Discharge: Family Type of Home: House Home Access: Ramped entrance;Stairs to enter Entrance Stairs-Rails: None Entrance Stairs-Number of Steps: 2   Home Layout: One level Home Equipment: Agricultural consultant (2 wheels);Cane - single point;Shower seat      Prior Function Prior Level of Function : Independent/Modified Independent;Driving;Working/employed             Mobility Comments: mod I with intermittent use of SPC for stability, IND for all ADLs and self care tasks       Extremity/Trunk Assessment        Lower Extremity Assessment Lower Extremity Assessment: LLE deficits/detail LLE Deficits / Details: ankle Df/PF 5/5; AA for first SLR and A for remainer < 10 degree lag LLE Sensation: WNL    Cervical / Trunk Assessment Cervical / Trunk Assessment: Normal  Communication   Communication Communication: No apparent difficulties  Cognition Arousal: Lethargic, Suspect due to medications (groggy) Behavior During Therapy: WFL for tasks assessed/performed Overall Cognitive Status: Within Functional Limits for  tasks assessed                                          General Comments      Exercises Total Joint Exercises Ankle Circles/Pumps: AROM, Both, 10 reps Quad Sets: AROM, Left, 5 reps Short Arc Quad: AROM, Left, 5 reps Heel Slides: AROM, Left, 5 reps Hip ABduction/ADduction: AROM, 5 reps, Left Straight Leg Raises: AROM, Left, 5 reps Knee Flexion: AROM, Left, 5 reps   Assessment/Plan    PT Assessment Patient needs continued PT services  PT Problem List Decreased strength;Decreased range of motion;Decreased activity tolerance;Decreased balance;Decreased mobility;Decreased coordination;Pain       PT Treatment Interventions DME instruction;Gait training;Functional mobility training;Therapeutic activities;Therapeutic exercise;Balance training;Neuromuscular re-education;Patient/family education;Modalities    PT Goals (Current goals can be found in the Care Plan section)  Acute Rehab PT Goals Patient Stated Goal: move without pain PT Goal Formulation: With patient Time For Goal Achievement: 10/22/23 Potential to Achieve Goals: Good    Frequency 7X/week     Co-evaluation               AM-PAC PT "6 Clicks" Mobility  Outcome Measure Help needed turning from your back to your side while in a flat bed without using bedrails?:  A Little Help needed moving from lying on your back to sitting on the side of a flat bed without using bedrails?: A Little Help needed moving to and from a bed to a chair (including a wheelchair)?: A Little Help needed standing up from a chair using your arms (e.g., wheelchair or bedside chair)?: A Little Help needed to walk in hospital room?: A Little Help needed climbing 3-5 steps with a railing? : A Little 6 Click Score: 18    End of Session Equipment Utilized During Treatment: Gait belt       PT Visit Diagnosis: Unsteadiness on feet (R26.81);Other abnormalities of gait and mobility (R26.89);Muscle weakness (generalized)  (M62.81);History of falling (Z91.81);Difficulty in walking, not elsewhere classified (R26.2);Pain Pain - Right/Left: Left Pain - part of body: Knee;Leg    Time: 1610-9604 PT Time Calculation (min) (ACUTE ONLY): 50 min   Charges:   PT Evaluation $PT Eval Low Complexity: 1 Low PT Treatments $Gait Training: 8-22 mins $Therapeutic Exercise: 8-22 mins PT General Charges $$ ACUTE PT VISIT: 1 Visit         Johnny Bridge, PT Acute Rehab   Jacqualyn Posey 10/08/2023, 3:36 PM

## 2023-10-08 NOTE — Progress Notes (Signed)
Pt aware of results and recommendation for SE for the lateral chest and right arm.    Diagnosis 1. Skin , left lateral back  --> observe for repigmentation DYSPLASTIC COMPOUND NEVUS WITH MILD ATYPIA, CLOSE TO MARGIN  2. Skin , left lateral chest --> SE w/ Dr Caralyn Guile Recommended DYSPLASTIC NEVUS WITH MODERATE TO SEVERE ATYPIA, CLOSE TO MARGIN, SEE DESCRIPTION  3. Skin , right upper arm - posterior  --> SE w/ Dr Caralyn Guile Recommended DYSPLASTIC COMPOUND NEVUS WITH SEVERE ATYPIA, CLOSE TO MARGIN, SEE DESCRIPTION

## 2023-10-08 NOTE — Discharge Instructions (Addendum)

## 2023-10-08 NOTE — Transfer of Care (Signed)
Immediate Anesthesia Transfer of Care Note  Patient: Amy Mooney  Procedure(s) Performed: REVISION LEFT KNEE ARTHROPLASTY (Left: Knee)  Patient Location: PACU  Anesthesia Type:Spinal  Level of Consciousness: awake, alert , and oriented  Airway & Oxygen Therapy: Patient Spontanous Breathing and Patient connected to face mask oxygen  Post-op Assessment: Report given to RN and Post -op Vital signs reviewed and stable  Post vital signs: Reviewed and stable  Last Vitals:  Vitals Value Taken Time  BP 110/59 10/08/23 1028  Temp    Pulse 72 10/08/23 1030  Resp 22 10/08/23 1030  SpO2 100 % 10/08/23 1030  Vitals shown include unfiled device data.  Last Pain:  Vitals:   10/08/23 0544  TempSrc: Oral  PainSc:          Complications: No notable events documented.

## 2023-10-08 NOTE — Op Note (Signed)
PATIENT ID:      Amy Mooney  MRN:     [id number] DOB/AGE:    [birth year] / 57 y.o.       OPERATIVE REPORT   DATE OF PROCEDURE:  10/08/2023      PREOPERATIVE DIAGNOSIS:   LOOSE LEFT KNEE TIBIA IMPLANT      Estimated body mass index is 40.87 kg/m as calculated from the following:   Height as of this encounter: 5\' 4"  (1.626 m).   Weight as of this encounter: 108 kg.                                                       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  Revision to a #3 MBT tray with a 14 mm x 110 mm stem, a new 15 mm RP bearing.    SURGEON: Nestor Lewandowsky  ASSISTANT:   Tomi Likens. 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  Started having progressive loosening about a year and a half ago and was referred for consideration of revision arthroplasty.  We first saw her 2 months ago.  The tibial implant had gone into varus almost 20 degrees.  Posterior medially there was a rim fracture of the tibia.  The femoral and patella implants appear to be well-placed and well-fixed with no interval change.  Inflammatory markers were normal.  No overt evidence of infection.  Pain was interfering with chores around the house and her job as a Lawyer.  Risks and benefits of surgery have  been discussed, questions answered.   DESCRIPTION OF PROCEDURE: The patient identified by armband, received  IV antibiotics, in the holding area at [facility] Patient taken to the operating room, appropriate anesthetic monitors were attached, and spinal anesthesia was  induced. IV Tranexamic acid was given.Tourniquet applied high to the operative thigh. Lateral post and foot positioner applied to the table, the lower extremity was then prepped and draped in usual sterile fashion from the toes to the tourniquet. Time-out procedure was performed. The skin and subcutaneous tissue along the incision was injected with 20 cc of a mixture of Exparel and Marcaine solution, using a 20-gauge by 1-1/2 inch needle. We began the operation, with the knee flexed 130 degrees, by making the anterior midline incision starting at handbreadth above the patella going over the patella  1 cm medial to and 4 cm distal to the tibial tubercle. Small bleeders in the skin and the subcutaneous tissue identified and cauterized. Transverse retinaculum was incised and reflected medially and a medial parapatellar arthrotomy was accomplished.  The synovium had bits of cement embedded minute and a synovectomy was accomplished.  The superficial medial collateral ligament was elevated off the medial flare of the tibia the patella was everted and the Hyperflex giving [name] excellent exposure of the femoral and patellar implants which are well-placed and well-fixed and there was no evidence of loosening.  We worked our way around the tibial implant and externally rotated the tibia allowing [name] to remove the 10 mm bearing insert that was in place.  The tibial implant was grossly loose and was simply lifted out of the bed.  It was in 20 degrees of varus.  Posterior medially there was about 1/4 inch of deficiency of the rim of the proximal tibia.  Using osteotomes and curettes we remove the remaining cement.  We also remove the cement around the  stem.  We then sequentially reamed up to a 14 mm reamer to the appropriate depth for a 110 mm stem.  With the reamer in place we sized for #3 tibial baseplate and pinned the trial in place.  Proximal conical reaming was then accomplished.  We assembled a #3 trial tibial implant with a 14 mm x 110 mm trial stem this was inserted and the keel punch applied.  The trial was noted to have a snug fit.  We then performed trials with a 12 mm and a 15 mm bearing and the 15 mm bearing came to full extension with good collateral ligament tension.  At this point the trial implants were removed we continue to remove fibrous tissue from the interface between the old cement and the tibia.  We also spent a little bit of time seeing we get a metaphyseal sleeve then but the proximal flare of the tibia was simply too small.  At this point the bony surfaces and canal are dried with suction and sponges.  Exparel was injected into the posterior capsule and periosteum.  A double batch of DePuy HV cement was mixed up at the back table.  The real implant which was a #3 MBT tray with a 14 mm x 110 mm stem had been assembled with the back table.  Cement was applied to the proximal aspects of the tibial implant and the bone of the proximal tibia.  We then inserted the tibial implant and hammered into place and the correct rotation to match the proximal tibia.  Excess cement was removed the 15 mm bearing inserted the knee reduced. The knee was held at 30 flexion with compression, while the cement cured. The wound was irrigated out with normal saline solution pulse lavage. The rest of the Exparel was injected into the parapatellar arthrotomy, subcutaneous tissues, and periosteal tissues. The parapatellar arthrotomy was closed with running #1 Vicryl suture. The subcutaneous tissue with 0 and 2-0 undyed Vicryl suture, and the skin with running 3-0 SQ vicryl. An Aquacil and Ace wrap were applied. The patient was taken to recovery room without  difficulty.   Nestor Lewandowsky 10/08/2023, 6:57 AM

## 2023-10-08 NOTE — Anesthesia Procedure Notes (Signed)
Anesthesia Regional Block: Adductor canal block   Pre-Anesthetic Checklist: , timeout performed,  Correct Patient, Correct Site, Correct Laterality,  Correct Procedure, Correct Position, site marked,  Risks and benefits discussed,  Surgical consent,  Pre-op evaluation,  At surgeon's request and post-op pain management  Laterality: Lower and Left  Prep: chloraprep       Needles:  Injection technique: Single-shot  Needle Type: Echogenic Needle     Needle Length: 9cm  Needle Gauge: 22     Additional Needles:   Procedures:,,,, ultrasound used (permanent image in chart),,    Narrative:  Start time: 10/08/2023 6:37 AM End time: 10/08/2023 6:45 AM Injection made incrementally with aspirations every 5 mL.  Performed by: Personally  Anesthesiologist: Trevor Iha, MD  Additional Notes: Block assessed prior to surgery. Pt tolerated procedure well.

## 2023-10-08 NOTE — Progress Notes (Signed)
Orthopedic Tech Progress Note Patient Details:  Amy Mooney 07-09-1966 [id number]  Ortho Devices Type of Ortho Device: Bone foam zero knee Ortho Device/Splint Location: LLE Ortho Device/Splint Interventions: Ordered, Application, Adjustment   Post Interventions Patient Tolerated: Poor Instructions Provided: Care of device  [place] A Tionne Dayhoff 10/08/2023, 11:15 AM

## 2023-10-08 NOTE — Interval H&P Note (Signed)
History and Physical Interval Note:  10/08/2023 6:56 AM  Amy Mooney  has presented today for surgery, with the diagnosis of LOOSE LEFT KNEE TIBIA IMPLANT.  The various methods of treatment have been discussed with the patient and family. After consideration of risks, benefits and other options for treatment, the patient has consented to  Procedure(s): REVISION LEFT KNEE ARTHROPLASTY (Left) as a surgical intervention.  The patient's history has been reviewed, patient examined, no change in status, stable for surgery.  I have reviewed the patient's chart and labs.  Questions were answered to the patient's satisfaction.     Nestor Lewandowsky

## 2023-10-09 ENCOUNTER — Encounter (HOSPITAL_COMMUNITY): Payer: Self-pay | Admitting: Orthopedic Surgery

## 2023-10-10 ENCOUNTER — Ambulatory Visit: Payer: 59 | Admitting: Physical Therapy

## 2023-10-10 DIAGNOSIS — M25562 Pain in left knee: Secondary | ICD-10-CM | POA: Diagnosis not present

## 2023-10-10 DIAGNOSIS — R2689 Other abnormalities of gait and mobility: Secondary | ICD-10-CM | POA: Diagnosis not present

## 2023-10-10 DIAGNOSIS — M25652 Stiffness of left hip, not elsewhere classified: Secondary | ICD-10-CM | POA: Diagnosis not present

## 2023-10-12 ENCOUNTER — Ambulatory Visit: Payer: Commercial Managed Care - PPO | Admitting: Orthopedic Surgery

## 2023-10-12 DIAGNOSIS — R2689 Other abnormalities of gait and mobility: Secondary | ICD-10-CM | POA: Diagnosis not present

## 2023-10-12 DIAGNOSIS — M25562 Pain in left knee: Secondary | ICD-10-CM | POA: Diagnosis not present

## 2023-10-15 DIAGNOSIS — R2689 Other abnormalities of gait and mobility: Secondary | ICD-10-CM | POA: Diagnosis not present

## 2023-10-15 DIAGNOSIS — M25562 Pain in left knee: Secondary | ICD-10-CM | POA: Diagnosis not present

## 2023-10-18 ENCOUNTER — Encounter: Payer: 59 | Admitting: Physical Therapy

## 2023-10-18 DIAGNOSIS — M25652 Stiffness of left hip, not elsewhere classified: Secondary | ICD-10-CM | POA: Diagnosis not present

## 2023-10-18 DIAGNOSIS — R2689 Other abnormalities of gait and mobility: Secondary | ICD-10-CM | POA: Diagnosis not present

## 2023-10-18 DIAGNOSIS — M25562 Pain in left knee: Secondary | ICD-10-CM | POA: Diagnosis not present

## 2023-10-21 NOTE — Progress Notes (Unsigned)
SUBJECTIVE: Discussed the use of AI scribe software for clinical note transcription with the patient, who gave verbal consent to proceed.  History of Present Illness      Chief Complaint: Obesity  Interim History: She is down 1 lb since her last visit.   Amy Mooney, a 57 year old patient with a history of prediabetes and obesity, recently underwent a revision left total knee replacement. The patient has been on a regimen of phentermine 8mg  twice daily for medical weight loss and metformin 500mg  twice daily for prediabetes. Weight loss of one pound over the holiday period.  Post-surgery, the patient experienced discomfort rather than pain, describing the sensation as more uncomfortable than painful. The patient has been attending physical therapy, which she describes as a little painful. She reports progress in regaining full extension and bend in the knee, although the bending part is described as a little much.  The patient reports a return to normal eating habits after a week of reduced appetite post-surgery, which she attributes to the effects of anesthesia. She had to stop taking her medication a week before surgery and resumed it a week after surgery. The patient has been taking phentermine once a day since resuming the medication but was taking it twice a day before surgery.  The patient's blood pressure was slightly elevated at the time of the visit today which may be related the pain from physical therapy which she just completed prior to today's visit.   Amy Mooney is here to discuss her progress with her obesity treatment plan. She is on the Category 2 Plan and states she is following her eating plan approximately 75 % of the time. She states she is exercising /Going to PT following knee surgery  minutes 2 times per week.   OBJECTIVE: Visit Diagnoses: Problem List Items Addressed This Visit     Vitamin D deficiency   Pre-diabetes   Relevant Medications   metFORMIN (GLUCOPHAGE)  500 MG tablet   Obesity (HCC)- Start BMI 46.77   Relevant Medications   metFORMIN (GLUCOPHAGE) 500 MG tablet   Phentermine HCl ([name]) 8 MG TABS   Polyphagia   Relevant Medications   Phentermine HCl ([name]) 8 MG TABS   Other Visit Diagnoses       S/P TKR (total knee replacement), left    -  Primary     Postoperative -Left Total Knee Replacement Two weeks post revision left total knee replacement. Reports discomfort rather than pain, with some pain during physical therapy. Emphasized the importance of physical therapy for recovery and potential discomfort associated with it. - Continue physical therapy and follow up with Dr. Turner Daniels as instructed.    Obesity with polyphagia Currently on phentermine 8 mg twice daily for medical weight loss. Weight loss of 33 pounds, with a current weight of 229 pounds on home scale.  Resumed phentermine once daily postoperatively when okayed,  and plans to return to twice daily dosing to enhance appetite suppression and weight loss. Discussed potential benefits and risks of increased dosing. Patient prefers to return to twice daily dosing. - Resume/refill phentermine 8 mg twice daily - Monitor weight and appetite suppression  Prediabetes Lab Results  Component Value Date   HGBA1C 6.0 (H) 02/23/2023   HGBA1C 6.2 11/03/2022   HGBA1C 6.2 (H) 05/25/2022   Lab Results  Component Value Date   LDLCALC 80 05/25/2022   CREATININE 0.79 10/02/2023    Currently on metformin 500 mg twice daily. No reported issues with metformin. Continue working on  nutrition plan to decrease simple carbohydrates, increase lean proteins and exercise to promote weight loss, improve glycemic control and prevent progression to Type 2 diabetes.   - Continue metformin 500 mg twice daily - Check A1c at next appointment with all fasting labs as well.   Vitamin D Deficiency Vitamin D is not at goal of 50.  Most recent vitamin D level was 42.1. She is on OTC vitamin D3 1000 IU  daily. Lab Results  Component Value Date   VD25OH 42.1 02/23/2023   VD25OH 46.5 05/25/2022   VD25OH 50.04 08/27/2020    Plan: Continue OTC vitamin D3 1000 IU daily Low vitamin D levels can be associated with adiposity and may result in leptin resistance and weight gain. Also associated with fatigue. Currently on vitamin D supplementation without any adverse effects.  Recheck vitamin D level with labs next visit to optimize supplementation.   General Health Maintenance Blood pressure slightly elevated at 129/81, likely due to recent physical therapy session. - Monitor blood pressure  Follow-up - Schedule follow-up appointment for January 29th at 7 AM - Check A1c and potassium levels at next appointment.  Vitals Temp: 97.7 F (36.5 C) BP: (!) 148/72 Pulse Rate: 81 SpO2: 99 %   Anthropometric Measurements Height: 5\' 3"  (1.6 m) Weight: 231 lb (104.8 kg) BMI (Calculated): 40.93 Weight at Last Visit: 232 lb Weight Lost Since Last Visit: 1 lb Weight Gained Since Last Visit: 0 Starting Weight: 264 lb Total Weight Loss (lbs): 33 lb (15 kg)   Body Composition  Body Fat %: 50.8 % Fat Mass (lbs): 117.4 lbs Muscle Mass (lbs): 107.8 lbs Total Body Water (lbs): 85 lbs Visceral Fat Rating : 16   Other Clinical Data Fasting: No Labs: No Today's Visit #: 17 Starting Date: 05/25/22     ASSESSMENT AND PLAN:  Diet: Amy Mooney is currently in the action stage of change. As such, her goal is to continue with weight loss efforts. She has agreed to Category 2 Plan.  Exercise: Amy Mooney has been instructed  exercise as able following left TKR revision surgery 2 weeks ago  for weight loss and overall health benefits.   Behavior Modification:  We discussed the following Behavioral Modification Strategies today: increasing lean protein intake, decreasing simple carbohydrates, increasing vegetables, increase H2O intake, increase high fiber foods, no skipping meals, better snacking  choices, holiday eating strategies, and planning for success. We discussed various medication options to help [facility] with her weight loss efforts and we both agreed to continue and resume BID phentermine ([name] 8 mg tabs) and continue metformin for prediabetes management.  Return in about 4 weeks (around 11/19/2023) for Fasting Lab.Marland Kitchen She was informed of the importance of frequent follow up visits to maximize her success with intensive lifestyle modifications for her multiple health conditions.  Attestation Statements:   Reviewed by clinician on day of visit: allergies, medications, problem list, medical history, surgical history, family history, social history, and previous encounter notes.   Time spent on visit including pre-visit chart review and post-visit care and charting was 28 minutes.    Cane Dubray, PA-C

## 2023-10-22 ENCOUNTER — Other Ambulatory Visit: Payer: Self-pay

## 2023-10-22 ENCOUNTER — Encounter (INDEPENDENT_AMBULATORY_CARE_PROVIDER_SITE_OTHER): Payer: Self-pay | Admitting: Physician Assistant

## 2023-10-22 ENCOUNTER — Encounter: Payer: 59 | Admitting: Physical Therapy

## 2023-10-22 ENCOUNTER — Ambulatory Visit (INDEPENDENT_AMBULATORY_CARE_PROVIDER_SITE_OTHER): Payer: 59 | Admitting: Physician Assistant

## 2023-10-22 VITALS — BP 148/72 | HR 81 | Temp 97.7°F | Ht 63.0 in | Wt 231.0 lb

## 2023-10-22 DIAGNOSIS — E559 Vitamin D deficiency, unspecified: Secondary | ICD-10-CM | POA: Diagnosis not present

## 2023-10-22 DIAGNOSIS — Z96652 Presence of left artificial knee joint: Secondary | ICD-10-CM

## 2023-10-22 DIAGNOSIS — Z6841 Body Mass Index (BMI) 40.0 and over, adult: Secondary | ICD-10-CM | POA: Diagnosis not present

## 2023-10-22 DIAGNOSIS — R7303 Prediabetes: Secondary | ICD-10-CM

## 2023-10-22 DIAGNOSIS — E669 Obesity, unspecified: Secondary | ICD-10-CM | POA: Diagnosis not present

## 2023-10-22 DIAGNOSIS — M25562 Pain in left knee: Secondary | ICD-10-CM | POA: Diagnosis not present

## 2023-10-22 DIAGNOSIS — R632 Polyphagia: Secondary | ICD-10-CM | POA: Diagnosis not present

## 2023-10-22 DIAGNOSIS — R2689 Other abnormalities of gait and mobility: Secondary | ICD-10-CM | POA: Diagnosis not present

## 2023-10-22 DIAGNOSIS — M25652 Stiffness of left hip, not elsewhere classified: Secondary | ICD-10-CM | POA: Diagnosis not present

## 2023-10-22 MED ORDER — METFORMIN HCL 500 MG PO TABS
500.0000 mg | ORAL_TABLET | Freq: Two times a day (BID) | ORAL | 0 refills | Status: DC
  Filled 2023-10-22: qty 60, 30d supply, fill #0

## 2023-10-22 MED ORDER — LOMAIRA 8 MG PO TABS
8.0000 mg | ORAL_TABLET | Freq: Two times a day (BID) | ORAL | 0 refills | Status: DC
  Filled 2023-10-22: qty 60, 30d supply, fill #0

## 2023-10-23 ENCOUNTER — Other Ambulatory Visit: Payer: Self-pay

## 2023-10-25 ENCOUNTER — Other Ambulatory Visit: Payer: Self-pay

## 2023-10-25 ENCOUNTER — Encounter: Payer: 59 | Admitting: Physical Therapy

## 2023-10-25 DIAGNOSIS — R2689 Other abnormalities of gait and mobility: Secondary | ICD-10-CM | POA: Diagnosis not present

## 2023-10-25 DIAGNOSIS — M25562 Pain in left knee: Secondary | ICD-10-CM | POA: Diagnosis not present

## 2023-10-29 DIAGNOSIS — R2689 Other abnormalities of gait and mobility: Secondary | ICD-10-CM | POA: Diagnosis not present

## 2023-10-29 DIAGNOSIS — M25562 Pain in left knee: Secondary | ICD-10-CM | POA: Diagnosis not present

## 2023-10-30 ENCOUNTER — Encounter: Payer: 59 | Admitting: Physical Therapy

## 2023-11-01 ENCOUNTER — Encounter: Payer: 59 | Admitting: Physical Therapy

## 2023-11-01 DIAGNOSIS — R2689 Other abnormalities of gait and mobility: Secondary | ICD-10-CM | POA: Diagnosis not present

## 2023-11-01 DIAGNOSIS — M25562 Pain in left knee: Secondary | ICD-10-CM | POA: Diagnosis not present

## 2023-11-05 DIAGNOSIS — M25562 Pain in left knee: Secondary | ICD-10-CM | POA: Diagnosis not present

## 2023-11-05 DIAGNOSIS — R2689 Other abnormalities of gait and mobility: Secondary | ICD-10-CM | POA: Diagnosis not present

## 2023-11-06 ENCOUNTER — Encounter: Payer: 59 | Admitting: Physical Therapy

## 2023-11-06 ENCOUNTER — Encounter: Payer: 59 | Admitting: Primary Care

## 2023-11-08 ENCOUNTER — Encounter: Payer: 59 | Admitting: Physical Therapy

## 2023-11-08 DIAGNOSIS — M25562 Pain in left knee: Secondary | ICD-10-CM | POA: Diagnosis not present

## 2023-11-08 DIAGNOSIS — R2689 Other abnormalities of gait and mobility: Secondary | ICD-10-CM | POA: Diagnosis not present

## 2023-11-09 ENCOUNTER — Ambulatory Visit (INDEPENDENT_AMBULATORY_CARE_PROVIDER_SITE_OTHER): Payer: 59 | Admitting: Primary Care

## 2023-11-09 ENCOUNTER — Encounter: Payer: Self-pay | Admitting: Primary Care

## 2023-11-09 VITALS — BP 138/84 | HR 65 | Temp 97.9°F | Ht 63.0 in | Wt 234.0 lb

## 2023-11-09 DIAGNOSIS — G2581 Restless legs syndrome: Secondary | ICD-10-CM | POA: Diagnosis not present

## 2023-11-09 DIAGNOSIS — E7849 Other hyperlipidemia: Secondary | ICD-10-CM

## 2023-11-09 DIAGNOSIS — G4733 Obstructive sleep apnea (adult) (pediatric): Secondary | ICD-10-CM | POA: Diagnosis not present

## 2023-11-09 DIAGNOSIS — Z0000 Encounter for general adult medical examination without abnormal findings: Secondary | ICD-10-CM

## 2023-11-09 DIAGNOSIS — G43009 Migraine without aura, not intractable, without status migrainosus: Secondary | ICD-10-CM

## 2023-11-09 DIAGNOSIS — F3289 Other specified depressive episodes: Secondary | ICD-10-CM | POA: Diagnosis not present

## 2023-11-09 DIAGNOSIS — I10 Essential (primary) hypertension: Secondary | ICD-10-CM

## 2023-11-09 DIAGNOSIS — E559 Vitamin D deficiency, unspecified: Secondary | ICD-10-CM | POA: Diagnosis not present

## 2023-11-09 DIAGNOSIS — Z8619 Personal history of other infectious and parasitic diseases: Secondary | ICD-10-CM

## 2023-11-09 DIAGNOSIS — E66813 Obesity, class 3: Secondary | ICD-10-CM

## 2023-11-09 DIAGNOSIS — Z1231 Encounter for screening mammogram for malignant neoplasm of breast: Secondary | ICD-10-CM

## 2023-11-09 DIAGNOSIS — Z6841 Body Mass Index (BMI) 40.0 and over, adult: Secondary | ICD-10-CM

## 2023-11-09 DIAGNOSIS — R7303 Prediabetes: Secondary | ICD-10-CM

## 2023-11-09 LAB — COMPREHENSIVE METABOLIC PANEL
ALT: 26 U/L (ref 0–35)
AST: 24 U/L (ref 0–37)
Albumin: 4.5 g/dL (ref 3.5–5.2)
Alkaline Phosphatase: 104 U/L (ref 39–117)
BUN: 20 mg/dL (ref 6–23)
CO2: 30 meq/L (ref 19–32)
Calcium: 9.8 mg/dL (ref 8.4–10.5)
Chloride: 101 meq/L (ref 96–112)
Creatinine, Ser: 0.69 mg/dL (ref 0.40–1.20)
GFR: 96.36 mL/min (ref >60.00)
Glucose, Bld: 81 mg/dL (ref 70–99)
Potassium: 3.9 meq/L (ref 3.5–5.1)
Sodium: 141 meq/L (ref 135–145)
Total Bilirubin: 0.7 mg/dL (ref 0.2–1.2)
Total Protein: 7.2 g/dL (ref 6.0–8.3)

## 2023-11-09 LAB — LIPID PANEL
Cholesterol: 164 mg/dL (ref 0–200)
HDL: 52.6 mg/dL (ref >39.00)
LDL Cholesterol: 89 mg/dL (ref 0–99)
NonHDL: 111.73
Total CHOL/HDL Ratio: 3
Triglycerides: 115 mg/dL (ref 0.0–149.0)
VLDL: 23 mg/dL (ref 0.0–40.0)

## 2023-11-09 LAB — CBC
HCT: 37.4 % (ref 36.0–46.0)
Hemoglobin: 12.1 g/dL (ref 12.0–15.0)
MCHC: 32.3 g/dL (ref 30.0–36.0)
MCV: 82.3 fL (ref 78.0–100.0)
Platelets: 233 10*3/uL (ref 150.0–400.0)
RBC: 4.55 Mil/uL (ref 3.87–5.11)
RDW: 14.9 % (ref 11.5–15.5)
WBC: 7.2 10*3/uL (ref 4.0–10.5)

## 2023-11-09 LAB — VITAMIN D 25 HYDROXY (VIT D DEFICIENCY, FRACTURES): VITD: 28.61 ng/mL — ABNORMAL LOW (ref 30.00–100.00)

## 2023-11-09 LAB — HEMOGLOBIN A1C: Hgb A1c MFr Bld: 5.6 % (ref 4.6–6.5)

## 2023-11-09 NOTE — Assessment & Plan Note (Signed)
Repeat vitamin D level pending. 

## 2023-11-09 NOTE — Assessment & Plan Note (Signed)
Repeat A1c pending.  Continue metformin 500 mg twice daily.

## 2023-11-09 NOTE — Assessment & Plan Note (Signed)
Declines Shingrix vaccines.  Mammogram due, orders placed. Colonoscopy UTD, due 2028  Discussed the importance of a healthy diet and regular exercise in order for weight loss, and to reduce the risk of further co-morbidity.  Exam stable. Labs pending.  Follow up in 1 year for repeat physical.

## 2023-11-09 NOTE — Assessment & Plan Note (Signed)
No use of CPAP.  Commended her on weight loss.

## 2023-11-09 NOTE — Patient Instructions (Signed)
Stop by the lab prior to leaving today. I will notify you of your results once received.   [facility] your [facility].   It was a pleasure to see you today!   

## 2023-11-09 NOTE — Assessment & Plan Note (Signed)
Controlled.  Continue Mirapex 0.25 mg at bedtime.

## 2023-11-09 NOTE — Progress Notes (Signed)
Subjective:    Patient ID: Amy Mooney, female    DOB: 06/15/1966, 58 y.o.   MRN: [id number]  HPI  Amy Mooney is a very pleasant 58 y.o. female who presents today for complete physical and follow up of chronic conditions.  Immunizations: -Tetanus: Completed in 2017 -Influenza: Completed the season -Shingles: Never completed   Diet: Fair diet.  Exercise: No regular exercise.  Eye exam: Completes annually  Dental exam: Completes semi-annually    Pap Smear: Hysterectomy Mammogram: Completed in July 2023  Colonoscopy: Completed in 2018, due 2028  BP Readings from Last 3 Encounters:  11/09/23 138/84  10/22/23 (!) 148/72  10/08/23 (!) 100/55       Review of Systems  Constitutional:  Negative for unexpected weight change.  HENT:  Negative for rhinorrhea.   Respiratory:  Negative for cough and shortness of breath.   Cardiovascular:  Negative for chest pain.  Gastrointestinal:  Negative for constipation and diarrhea.  Genitourinary:  Negative for difficulty urinating.  Musculoskeletal:  Positive for arthralgias.  Skin:  Negative for rash.  Allergic/Immunologic: Negative for environmental allergies.  Neurological:  Negative for dizziness, numbness and headaches.  Psychiatric/Behavioral:  The patient is not nervous/anxious.          Past Medical History:  Diagnosis Date   Allergy    Anemia 04/30/2019   Chronic knee pain    arthritis   Class 3 severe obesity with serious comorbidity and body mass index (BMI) of 45.0 to 49.9 in adult [facility]) 06/08/2022   Dysplastic nevus 02/13/2021   right upper back paraspinal, mod to severe atypia    Dysplastic nevus 10/06/2021   left of midline ant base of neck, moderate   Fever blister    history of   Grief reaction 06/07/2018   Heartburn    History of kidney stones    Hypertension    Migraines    Morbid obesity with BMI of 40.0-44.9, adult (HCC)    Narcolepsy without cataplexy(347.00)    Oral herpes     Osteoarthritis    Ovarian torsion 03/22/2017   Palpitations    Pre-diabetes    Primary osteoarthritis of left knee    Restless leg    S/P TKR (total knee replacement), left 05/27/2019 07/10/2019   S/P total knee replacement, right 10/29/18    Sleep apnea    per pt not treated; not able to use CPAP due to claustrophobia   Syncope 11/18/2019   Tinea of nail 09/03/2020   Vitamin D deficiency     Social History   Socioeconomic History   Marital status: Married    Spouse name: Amy Mooney   Number of children: 2   Years of education: College   Highest education level: Not on file  Occupational History   Occupation: CMA    Employer: [employer]  Tobacco Use   Smoking status: Former    Current packs/day: 0.00    Average packs/day: 0.5 packs/day for 4.0 years (2.0 ttl pk-yrs)    Types: Cigarettes    Start date: 10/23/1982    Quit date: 10/23/1986    Years since quitting: 37.0   Smokeless tobacco: Never  Vaping Use   Vaping status: Never Used  Substance and Sexual Activity   Alcohol use: No   Drug use: No   Sexual activity: Yes    Birth control/protection: None  Other Topics Concern   Not on file  Social History Narrative   Patient lives at home with her  husband Amy Mooney)   Patient has two adult children.   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   Patient has an college education.   Patient is right-handed.   Patient drinks very little caffeine.   Enjoys spending time with family.    Social Drivers of Corporate investment banker Strain: Not on file  Food Insecurity: Not on file  Transportation Needs: Not on file  Physical Activity: Not on file  Stress: Not on file  Social Connections: Not on file  Intimate Partner Violence: Not on file    Past Surgical History:  Procedure Laterality Date   ABDOMINAL HYSTERECTOMY  2010   artroscopic meniscus repair Bilateral    cervix removed, bladder tack     2015   CESAREAN SECTION     COLONOSCOPY WITH PROPOFOL  N/A 06/19/2017   Procedure: COLONOSCOPY WITH PROPOFOL;  Surgeon: Midge Minium, MD;  Location: [facility];  Service: Endoscopy;  Laterality: N/A;   LAPAROSCOPIC SALPINGO OOPHERECTOMY Right 03/22/2017   Procedure: LAPAROSCOPIC SALPINGO OOPHORECTOMY;  Surgeon: Ward, Elenora Fender, MD;  Location: [facility];  Service: Gynecology;  Laterality: Right;   TOTAL KNEE ARTHROPLASTY Right 10/29/2018   Procedure: TOTAL KNEE ARTHROPLASTY;  Surgeon: Vickki Hearing, MD;  Location: [facility];  Service: Orthopedics;  Laterality: Right;   TOTAL KNEE ARTHROPLASTY Left 05/27/2019   Procedure: TOTAL KNEE ARTHROPLASTY;  Surgeon: Vickki Hearing, MD;  Location: [facility];  Service: Orthopedics;  Laterality: Left;   TOTAL KNEE REVISION Left 10/08/2023   Procedure: REVISION LEFT KNEE ARTHROPLASTY;  Surgeon: Gean Birchwood, MD;  Location: [facility];  Service: Orthopedics;  Laterality: Left;    Family History  Problem Relation Age of Onset   Breast cancer Mother 3   Diabetes Mother    Depression Mother    Anxiety disorder Mother    Obesity Mother    Breast cancer Paternal Grandmother     Allergies  Allergen Reactions   Armodafinil Hives and Other (See Comments)   Penicillins Other (See Comments) and Rash    Has patient had a PCN reaction causing immediate rash, facial/tongue/throat swelling, SOB or lightheadedness with hypotension: Unknown  Has patient had a PCN reaction causing severe rash involving mucus membranes or skin necrosis: Unknown  Has patient had a PCN reaction that required hospitalization: Unknown  Has patient had a PCN reaction occurring within the last 10 years: Unknown  If all of the above answers are "NO", then may proceed with Cephalosporin use.   Vancomycin Hives   Lyrica [Pregabalin] Hives   Bupropion Other (See Comments)    Causes migraines    Current Outpatient Medications on File Prior to Visit  Medication Sig Dispense Refill   aspirin EC 81 MG tablet Take 1 tablet (81 mg total)  by mouth 2 (two) times daily. 60 tablet 0   cholecalciferol (VITAMIN D3) 25 MCG (1000 UNIT) tablet Take 1,000 Units by mouth at bedtime.     clobetasol cream (TEMOVATE) 0.05 % Apply 1 Application topically 2 (two) times daily. Avoid applying to face, groin, and axilla. (Patient taking differently: Apply 1 Application topically 2 (two) times daily as needed (Avoid applying to face, groin, and axilla.).) 60 g 2   doxycycline (VIBRAMYCIN) 50 MG capsule Take 1 capsule (50 mg total) by mouth 2 (two) times daily. 180 capsule 1   hydrochlorothiazide (HYDRODIURIL) 25 MG tablet Take 1 tablet (25 mg total) by mouth daily for blood pressure. 90 tablet 3   loratadine (CLARITIN) 10 MG tablet Take  10 mg by mouth daily as needed for allergies.     metFORMIN (GLUCOPHAGE) 500 MG tablet Take 1 tablet (500 mg total) by mouth 2 (two) times daily with a meal. 60 tablet 0   methocarbamol (ROBAXIN) 500 MG tablet Take 1 tablet (500 mg total) by mouth 4 (four) times daily. 120 tablet 0   Phentermine HCl ([name]) 8 MG TABS Take 1 tablet (8 mg total) by mouth 2 (two) times daily. 60 tablet 0   pramipexole (MIRAPEX) 0.25 MG tablet Take 1 tablet (0.25 mg total) by mouth at bedtime. For restless legs. 90 tablet 3   rizatriptan (MAXALT) 10 MG tablet Take 1 tablet by mouth at migraine onset.  May repeat in 2 hours if needed.  Do not exceed 2 tablets in 24 hours 10 tablet 0   Safety Seal Miscellaneous MISC Apply 1 Application topically in the morning. Medication Name: Hormonic Hair Minoxidil 8%, Finasteride 0.05% 30 mL 5   valACYclovir (VALTREX) 1000 MG tablet Take 2 tablets (2,000 mg total) by mouth as directed at onset of blister and 2 tablets 12 hours later 30 tablet 11   doxycycline (ADOXA) 50 MG tablet Take 1 tablet (50 mg total) by mouth 2 (two) times daily. (Patient not taking: Reported on 11/09/2023) 60 tablet 2   No current facility-administered medications on file prior to visit.    BP 138/84   Pulse 65   Temp 97.9 F  (36.6 C) (Temporal)   Ht 5\' 3"  (1.6 m)   Wt 234 lb (106.1 kg)   SpO2 99%   BMI 41.45 kg/m  Objective:   Physical Exam HENT:     Right Ear: Tympanic membrane and ear canal normal.     Left Ear: Tympanic membrane and ear canal normal.  Eyes:     Pupils: Pupils are equal, round, and reactive to light.  Cardiovascular:     Rate and Rhythm: Normal rate and regular rhythm.  Pulmonary:     Effort: Pulmonary effort is normal.     Breath sounds: Normal breath sounds.  Abdominal:     General: Bowel sounds are normal.     Palpations: Abdomen is soft.     Tenderness: There is no abdominal tenderness.  Musculoskeletal:        General: Normal range of motion.     Cervical back: Neck supple.  Skin:    General: Skin is warm and dry.  Neurological:     Mental Status: She is alert and oriented to person, place, and time.     Cranial Nerves: No cranial nerve deficit.     Deep Tendon Reflexes:     Reflex Scores:      Patellar reflexes are 2+ on the right side and 2+ on the left side. Psychiatric:        Mood and Affect: Mood normal.           Assessment & Plan:  Preventative health care Assessment & Plan: Declines Shingrix vaccines.  Mammogram due, orders placed. Colonoscopy UTD, due 2028  Discussed the importance of a healthy diet and regular exercise in order for weight loss, and to reduce the risk of further co-morbidity.  Exam stable. Labs pending.  Follow up in 1 year for repeat physical.    Screening mammogram for breast cancer -     3D Screening Mammogram, Left and Right; Future  Essential hypertension Assessment & Plan: Borderline high, improved compared to last check.  Continue hydrochlorothiazide 25 mg daily. CMP pending.  Orders: -     Comprehensive metabolic panel -     CBC  Migraine without aura and without status migrainosus, not intractable Assessment & Plan: Controlled.  Continue Maxalt 10 mg as needed.   Obstructive sleep apnea  syndrome Assessment & Plan: No use of CPAP.  Commended her on weight loss.   Class 3 severe obesity due to excess calories with serious comorbidity and body mass index (BMI) of 40.0 to 44.9 in adult [facility]) Assessment & Plan: Following with healthy weight and wellness center in [place].  Continue metformin 500 mg twice daily, phentermine 8 mg twice daily. Labs pending today.   Other depression Assessment & Plan: Controlled. No concerns today.   H/O cold sores Assessment & Plan: Controlled.  Continue Valtrex 2 g twice daily x 1 day.   Other hyperlipidemia Assessment & Plan: Commended her on weight loss. Repeat lipid panel pending.  Orders: -     Lipid panel  Prediabetes Assessment & Plan: Repeat A1c pending.  Continue metformin 500 mg twice daily.  Orders: -     Hemoglobin A1c  Restless leg Assessment & Plan: Controlled.  Continue Mirapex 0.25 mg at bedtime.   Vitamin D deficiency Assessment & Plan: Repeat vitamin D level pending.  Orders: -     VITAMIN D 25 Hydroxy (Vit-D Deficiency, Fractures)        Doreene Nest, NP

## 2023-11-09 NOTE — Assessment & Plan Note (Signed)
Controlled.  Continue Maxalt 10 mg as needed.

## 2023-11-09 NOTE — Assessment & Plan Note (Signed)
Commended her on weight loss!  Repeat lipid panel pending.

## 2023-11-09 NOTE — Assessment & Plan Note (Signed)
Borderline high, improved compared to last check.  Continue hydrochlorothiazide 25 mg daily. CMP pending.

## 2023-11-09 NOTE — Assessment & Plan Note (Signed)
Controlled.  Continue Valtrex 2 g twice daily x 1 day.

## 2023-11-09 NOTE — Assessment & Plan Note (Signed)
Controlled. No concerns today. 

## 2023-11-09 NOTE — Assessment & Plan Note (Signed)
Following with healthy weight and wellness center in [place].  Continue metformin 500 mg twice daily, phentermine 8 mg twice daily. Labs pending today.

## 2023-11-12 ENCOUNTER — Encounter: Payer: 59 | Admitting: Physical Therapy

## 2023-11-12 DIAGNOSIS — R2689 Other abnormalities of gait and mobility: Secondary | ICD-10-CM | POA: Diagnosis not present

## 2023-11-12 DIAGNOSIS — M25562 Pain in left knee: Secondary | ICD-10-CM | POA: Diagnosis not present

## 2023-11-14 ENCOUNTER — Encounter: Payer: 59 | Admitting: Physical Therapy

## 2023-11-19 ENCOUNTER — Encounter: Payer: 59 | Admitting: Physical Therapy

## 2023-11-21 ENCOUNTER — Other Ambulatory Visit: Payer: Self-pay

## 2023-11-21 ENCOUNTER — Encounter (INDEPENDENT_AMBULATORY_CARE_PROVIDER_SITE_OTHER): Payer: Self-pay | Admitting: Physician Assistant

## 2023-11-21 ENCOUNTER — Encounter: Payer: 59 | Admitting: Physical Therapy

## 2023-11-21 ENCOUNTER — Ambulatory Visit (INDEPENDENT_AMBULATORY_CARE_PROVIDER_SITE_OTHER): Payer: 59 | Admitting: Physician Assistant

## 2023-11-21 VITALS — BP 130/80 | HR 79 | Temp 98.0°F | Ht 63.0 in

## 2023-11-21 DIAGNOSIS — E7849 Other hyperlipidemia: Secondary | ICD-10-CM | POA: Diagnosis not present

## 2023-11-21 DIAGNOSIS — I10 Essential (primary) hypertension: Secondary | ICD-10-CM | POA: Diagnosis not present

## 2023-11-21 DIAGNOSIS — Z6841 Body Mass Index (BMI) 40.0 and over, adult: Secondary | ICD-10-CM | POA: Diagnosis not present

## 2023-11-21 DIAGNOSIS — M25562 Pain in left knee: Secondary | ICD-10-CM | POA: Diagnosis not present

## 2023-11-21 DIAGNOSIS — E559 Vitamin D deficiency, unspecified: Secondary | ICD-10-CM | POA: Diagnosis not present

## 2023-11-21 DIAGNOSIS — R632 Polyphagia: Secondary | ICD-10-CM

## 2023-11-21 DIAGNOSIS — R7303 Prediabetes: Secondary | ICD-10-CM

## 2023-11-21 DIAGNOSIS — E669 Obesity, unspecified: Secondary | ICD-10-CM

## 2023-11-21 DIAGNOSIS — R2689 Other abnormalities of gait and mobility: Secondary | ICD-10-CM | POA: Diagnosis not present

## 2023-11-21 MED ORDER — LOMAIRA 8 MG PO TABS
8.0000 mg | ORAL_TABLET | Freq: Two times a day (BID) | ORAL | 0 refills | Status: DC
  Filled 2023-11-21: qty 60, 30d supply, fill #0

## 2023-11-21 MED ORDER — METFORMIN HCL 500 MG PO TABS
500.0000 mg | ORAL_TABLET | Freq: Two times a day (BID) | ORAL | 0 refills | Status: DC
  Filled 2023-11-21: qty 60, 30d supply, fill #0

## 2023-11-21 MED ORDER — VITAMIN D (ERGOCALCIFEROL) 1.25 MG (50000 UNIT) PO CAPS
50000.0000 [IU] | ORAL_CAPSULE | ORAL | 0 refills | Status: DC
  Filled 2023-11-21: qty 12, 84d supply, fill #0

## 2023-11-21 NOTE — Progress Notes (Signed)
SUBJECTIVE:  Chief Complaint: Obesity  Interim History: She maintained her weight since last visit. Down 33 lbs overall TBW loss of 12.5% Amy Mooney is a 58 year old female who presents for follow-up of her obesity treatment plan.  She is currently taking [name] 8 mg twice a day for appetite suppression and metformin 500 mg twice a day for prediabetes. There has been no change in her weight, but she notices changes in her clothing fit, suggesting possible changes in body composition. She experiences significant stress, which she attributes to increased workload. She sometimes forgets to take Lumira twice a day but finds it helpful when she does.  Recent laboratory results indicate normal electrolytes, renal function, and liver function. Her cholesterol levels, including total cholesterol, HDL, LDL, and triglycerides, are within desired ranges. However, her vitamin D levels are low, and she has been taking only OTC vitamin D supplements. She previously took vitamin D and calcium but has been inconsistent lately with this as she ran out of the supplement.   She adheres to her nutrition plan but occasionally skips meals due to work commitments. She keeps protein shakes as an emergency option and sometimes consumes dry cereal, particularly frosted flakes, which she acknowledges as a dietary challenge. She has avoided ice cream recently, which she identifies as a potential dietary temptation.  She underwent surgery about six weeks ago for revision of her total knee replacement, and her hemoglobin and hematocrit are slightly low, but still in the normal range. She consumes some green leafy vegetables and occasionally uses an iron skillet, but not regularly. She has had a colonoscopy with good results and takes a multivitamin that includes B12. She does not experience any gastrointestinal upset with metformin.  Amy Mooney is here to discuss her progress with her obesity treatment plan. She is on the  Category 2 Plan and states she is following her eating plan approximately 75 % of the time. She states she is not exercising 0 minutes 0 times per week following her recent knee surgery about 6 weeks ago.  Pharmacotherapy: [name] 8 mg twice daily for appetite suppression. No Side effects. Fairly consistent with BID dosing.  I have consulted the [place] Controlled Substances Registry for this patient, and feel the risk/benefit ratio today is favorable for proceeding with this prescription for a controlled substance. No aberrancies noted.              Metformin 500 mg BID for prediabetes/insulin resistance. No GI or other side effects.   OBJECTIVE: Visit Diagnoses: Problem List Items Addressed This Visit     Essential hypertension - Primary   Vitamin D deficiency   Relevant Medications   Vitamin D, Ergocalciferol, (DRISDOL) 1.25 MG (50000 UNIT) CAPS capsule   Other hyperlipidemia   Obesity (HCC)- Start BMI 46.77   Relevant Medications   Phentermine HCl ([name]) 8 MG TABS   metFORMIN (GLUCOPHAGE) 500 MG tablet   Other Visit Diagnoses       Polyphagia       Relevant Medications   Phentermine HCl ([name]) 8 MG TABS     Pre-diabetes       Relevant Medications   metFORMIN (GLUCOPHAGE) 500 MG tablet     BMI 40.0-44.9, adult (HCC) Current BMI 41.0       Relevant Medications   Phentermine HCl ([name]) 8 MG TABS   metFORMIN (GLUCOPHAGE) 500 MG tablet     Obesity with polyphagia 58 year old with obesity, currently on [name] 8 mg BID for appetite suppression.  No significant weight change, but reports feeling it in clothes. Stress and work challenges noted. Adheres to medication most days but sometimes forgets. Discussed importance of consistent medication adherence and potential need to reassess if no progress. Discussed metabolic rate testing to adjust weight loss plan if necessary. - Continue/refill [name] 8 mg BID - Reassess weight and stress levels in follow-up - Schedule fasting IC  test and follow-up visit for early March - Reassess metabolic rate and adjust weight loss plan if necessary  Prediabetes On metformin 500 mg BID for prediabetes. Recent labs show improved A1c levels. No gastrointestinal upset reported with metformin. Discussed continued use of metformin to maintain glucose control. Lab Results  Component Value Date   HGBA1C 5.6 11/09/2023    - Continue/refill metformin 500 mg BID Continue working on nutrition plan to decrease simple carbohydrates, increase lean proteins and exercise to promote weight loss, improve glycemic control and prevent progression to Type 2 diabetes.  - Monitor A1c levels in follow-up  Vitamin D Deficiency Recent labs indicate low vitamin D levels.  Last vitamin D Lab Results  Component Value Date   VD25OH 28.61 (L) 11/09/2023   Previously taking vitamin D but ran out and did not replenish. Low vitamin D can affect energy levels and is associated with infections and cancers. Discussed benefits of high-dose vitamin D supplementation and plan to recheck levels. Stop OTC vitamin D.  - Prescribe vitamin D 50,000 units once a week for 3 months - Recheck vitamin D levels in a few months  Hyperlipidemia Recent labs show total cholesterol, HDL, LDL, and triglycerides within normal ranges. LDL previously higher but currently well-controlled. No statin required at this time. Last lipids Lab Results  Component Value Date   CHOL 164 11/09/2023   HDL 52.60 11/09/2023   LDLCALC 89 11/09/2023   TRIG 115.0 11/09/2023   CHOLHDL 3 11/09/2023    - Continue current management, no statin required Continue to work on nutrition plan -decreasing simple carbohydrates, increasing lean proteins, decreasing saturated fats and cholesterol , avoiding trans fats and exercise as able to promote weight loss, improve lipids and decrease cardiovascular risks.  Hypertension Hypertension asymptomatic, reasonably well controlled, and no significant  medication side effects noted.  Medication(s): hydrochlorothiazide 25 mg daily Renal normal. No side effects. BP nearly at goals.   BP Readings from Last 3 Encounters:  11/21/23 130/80  11/09/23 138/84  10/22/23 (!) 148/72   Lab Results  Component Value Date   CREATININE 0.69 11/09/2023   CREATININE 0.79 10/02/2023   CREATININE 0.85 02/23/2023   Lab Results  Component Value Date   GFR 96.36 11/09/2023   GFR 87.63 11/03/2022   GFR 73.62 08/27/2020    Plan: Continue all antihypertensives at current dosages. Continue to work on nutrition plan to promote weight loss and improve BP control.    General Health Maintenance 58 years old, had a colonoscopy with normal results. Takes a multivitamin but does not take additional B12. Metformin can block B12 absorption. Discussed potential benefits of B12 supplementation for energy levels. No recent B 12 levels. Previous level 561 08/19/2018 - Continue multivitamin - Consider B12 supplementation with 500 mcg sublingual daily if low energy persists - Recheck hemoglobin and hematocrit in a few months.  Vitals Temp: 98 F (36.7 C) BP: 130/80 Pulse Rate: 79 SpO2: 98 %   Anthropometric Measurements Height: 5\' 3"  (1.6 m) Weight at Last Visit: 231 lb Weight Lost Since Last Visit: 0 Weight Gained Since Last Visit: 0 Starting Weight:  264 lb Total Weight Loss (lbs): 33 lb (15 kg)   Body Composition  Body Fat %: 50.9 % Fat Mass (lbs): 117.8 lbs Muscle Mass (lbs): 107.8 lbs Total Body Water (lbs): 86.6 lbs Visceral Fat Rating : 16   Other Clinical Data Fasting: yes Labs: no Today's Visit #: 18 Starting Date: 05/25/22     ASSESSMENT AND PLAN:  Diet: Shrika is currently in the action stage of change. As such, her goal is to continue with weight loss efforts. She has agreed to Category 2 Plan.  Exercise: Franchon has been instructed  exercise once able /cleared by your orthopedist  for weight loss and overall health  benefits.   Behavior Modification:  We discussed the following Behavioral Modification Strategies today: increasing lean protein intake, decreasing simple carbohydrates, increasing vegetables, increase H2O intake, increase high fiber foods, no skipping meals, meal planning and cooking strategies, better snacking choices, avoiding temptations, and planning for success. We discussed various medication options to help [facility] with her weight loss efforts and we both agreed to continue [facility] for medical weight loss and metformin for prediabetes.  Return in about 5 weeks (around 12/26/2023) for Fasting IC.Marland Kitchen She was informed of the importance of frequent follow up visits to maximize her success with intensive lifestyle modifications for her multiple health conditions.  Attestation Statements:   Reviewed by clinician on day of visit: allergies, medications, problem list, medical history, surgical history, family history, social history, and previous encounter notes.   Time spent on visit including pre-visit chart review and post-visit care and charting was 38 minutes.    Danessa Mensch, PA-C

## 2023-11-23 ENCOUNTER — Other Ambulatory Visit: Payer: Self-pay

## 2023-11-28 DIAGNOSIS — M25562 Pain in left knee: Secondary | ICD-10-CM | POA: Diagnosis not present

## 2023-11-28 DIAGNOSIS — R2689 Other abnormalities of gait and mobility: Secondary | ICD-10-CM | POA: Diagnosis not present

## 2023-12-05 ENCOUNTER — Other Ambulatory Visit: Payer: Self-pay

## 2023-12-05 DIAGNOSIS — M25562 Pain in left knee: Secondary | ICD-10-CM | POA: Diagnosis not present

## 2023-12-05 DIAGNOSIS — R2689 Other abnormalities of gait and mobility: Secondary | ICD-10-CM | POA: Diagnosis not present

## 2023-12-05 MED ORDER — MINOXIDIL 2.5 MG PO TABS
1.2500 mg | ORAL_TABLET | Freq: Every day | ORAL | 6 refills | Status: DC
  Filled 2023-12-05: qty 15, 30d supply, fill #0
  Filled 2024-01-03: qty 15, 30d supply, fill #1
  Filled 2024-02-05: qty 15, 30d supply, fill #2
  Filled 2024-03-07: qty 15, 30d supply, fill #3
  Filled 2024-04-04: qty 15, 30d supply, fill #4
  Filled 2024-04-29: qty 15, 30d supply, fill #5
  Filled 2024-06-03: qty 15, 30d supply, fill #6

## 2023-12-09 IMAGING — CR DG CHEST 2V
1 series · 2 of 2 positions shown · non-contrast
Comparison: 07/15/2021

CLINICAL DATA: Short of breath with cough and wheezing

EXAM:
CHEST - 2 VIEW

[Series 1: dg chest 2 view · 0.14mm/px · 2 of 2 slices shown]
[im 1/2]
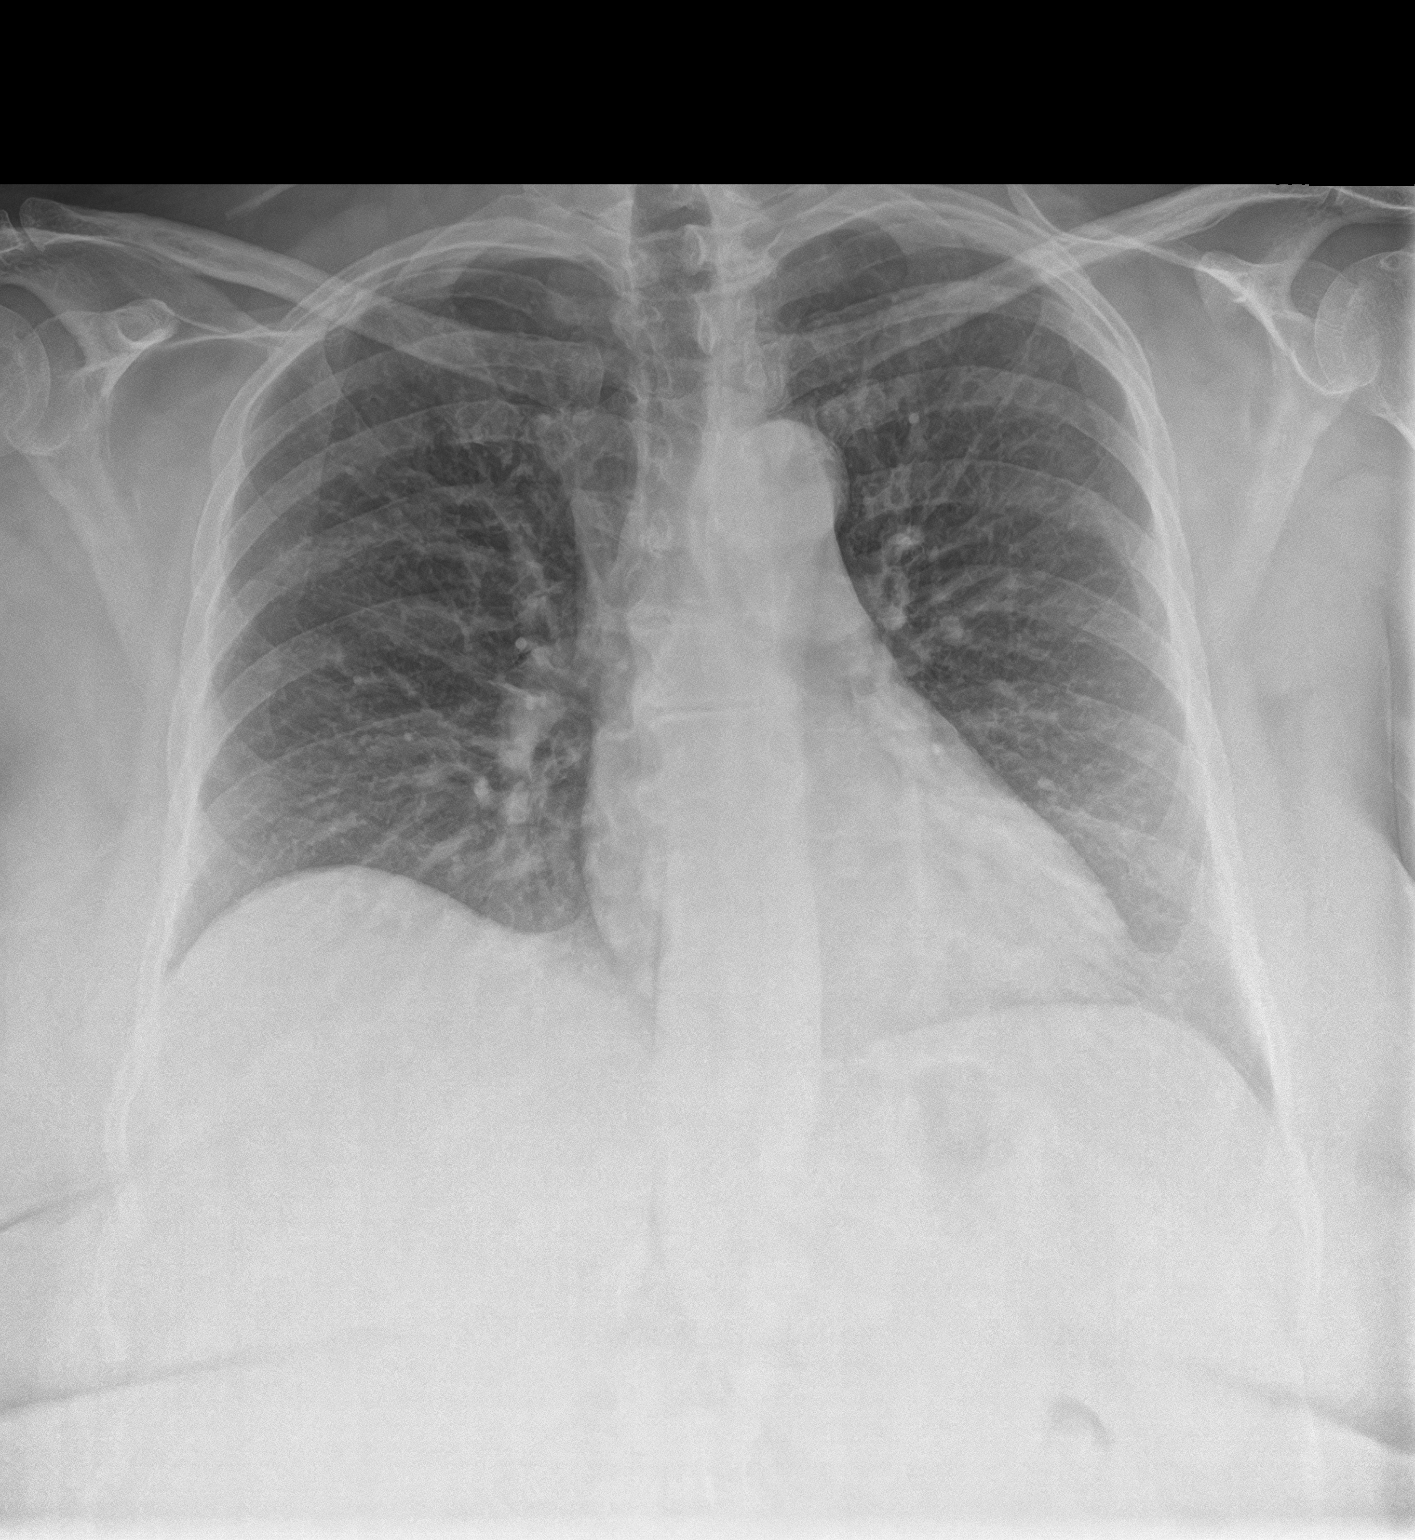
[im 2/2]
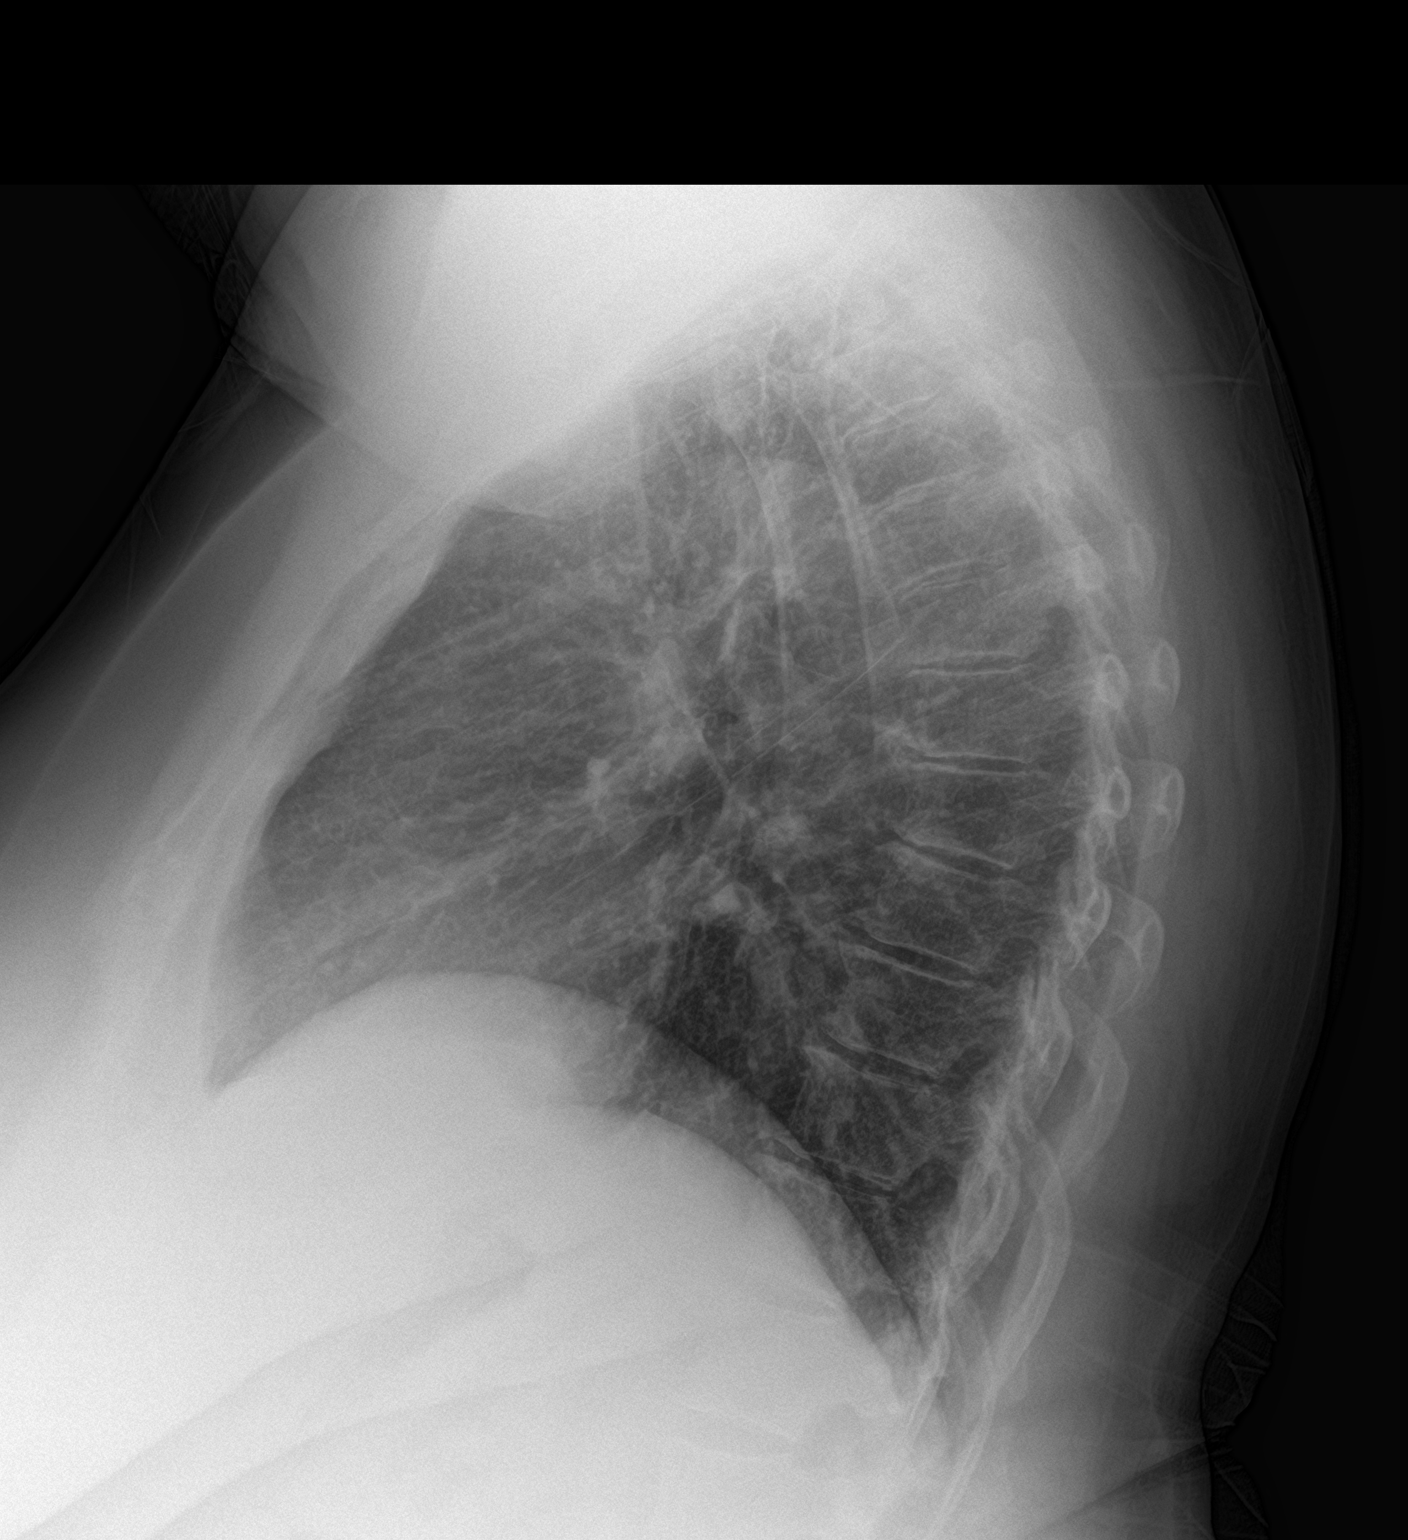

[2 of 2 positions shown; findings below may reference images not displayed]

FINDINGS: The heart size and mediastinal contours are within normal limits.
Both lungs are clear. The visualized skeletal structures are
unremarkable.
IMPRESSION: No active cardiopulmonary disease.

## 2023-12-24 ENCOUNTER — Other Ambulatory Visit: Payer: Self-pay | Admitting: Primary Care

## 2023-12-24 ENCOUNTER — Other Ambulatory Visit: Payer: Self-pay

## 2023-12-24 ENCOUNTER — Encounter (INDEPENDENT_AMBULATORY_CARE_PROVIDER_SITE_OTHER): Payer: Self-pay | Admitting: Physician Assistant

## 2023-12-24 DIAGNOSIS — G2581 Restless legs syndrome: Secondary | ICD-10-CM

## 2023-12-24 DIAGNOSIS — I10 Essential (primary) hypertension: Secondary | ICD-10-CM

## 2023-12-24 MED ORDER — HYDROCHLOROTHIAZIDE 25 MG PO TABS
25.0000 mg | ORAL_TABLET | Freq: Every day | ORAL | 2 refills | Status: DC
  Filled 2023-12-24: qty 90, 90d supply, fill #0
  Filled 2024-03-12: qty 90, 90d supply, fill #1
  Filled 2024-06-24: qty 90, 90d supply, fill #2

## 2023-12-24 MED ORDER — PRAMIPEXOLE DIHYDROCHLORIDE 0.25 MG PO TABS
0.2500 mg | ORAL_TABLET | Freq: Every day | ORAL | 2 refills | Status: DC
  Filled 2023-12-24: qty 90, 90d supply, fill #0
  Filled 2024-03-12: qty 90, 90d supply, fill #1
  Filled 2024-06-24: qty 90, 90d supply, fill #2

## 2023-12-25 ENCOUNTER — Other Ambulatory Visit: Payer: Self-pay

## 2023-12-25 ENCOUNTER — Encounter (INDEPENDENT_AMBULATORY_CARE_PROVIDER_SITE_OTHER): Payer: Self-pay | Admitting: Physician Assistant

## 2023-12-25 ENCOUNTER — Ambulatory Visit (INDEPENDENT_AMBULATORY_CARE_PROVIDER_SITE_OTHER): Payer: 59 | Admitting: Physician Assistant

## 2023-12-25 VITALS — BP 138/88 | HR 65 | Temp 97.6°F | Ht 63.0 in | Wt 235.0 lb

## 2023-12-25 DIAGNOSIS — E559 Vitamin D deficiency, unspecified: Secondary | ICD-10-CM | POA: Diagnosis not present

## 2023-12-25 DIAGNOSIS — R0602 Shortness of breath: Secondary | ICD-10-CM

## 2023-12-25 DIAGNOSIS — E669 Obesity, unspecified: Secondary | ICD-10-CM | POA: Diagnosis not present

## 2023-12-25 DIAGNOSIS — R632 Polyphagia: Secondary | ICD-10-CM

## 2023-12-25 DIAGNOSIS — Z6841 Body Mass Index (BMI) 40.0 and over, adult: Secondary | ICD-10-CM | POA: Diagnosis not present

## 2023-12-25 DIAGNOSIS — R7303 Prediabetes: Secondary | ICD-10-CM | POA: Diagnosis not present

## 2023-12-25 DIAGNOSIS — I10 Essential (primary) hypertension: Secondary | ICD-10-CM | POA: Diagnosis not present

## 2023-12-25 MED ORDER — LOMAIRA 8 MG PO TABS
8.0000 mg | ORAL_TABLET | Freq: Two times a day (BID) | ORAL | 0 refills | Status: DC
  Filled 2023-12-25: qty 60, 30d supply, fill #0

## 2023-12-25 MED ORDER — METFORMIN HCL 500 MG PO TABS
500.0000 mg | ORAL_TABLET | Freq: Two times a day (BID) | ORAL | 0 refills | Status: DC
  Filled 2023-12-25: qty 60, 30d supply, fill #0

## 2023-12-25 NOTE — Progress Notes (Signed)
 SUBJECTIVE: Discussed the use of AI scribe software for clinical note transcription with the patient, who gave verbal consent to proceed.  Chief Complaint: Obesity  Interim History: She is up 4 lbs since last visit.    Shantice is here to discuss her progress with her obesity treatment plan. She is on the Category 2 Plan and states she is following her eating plan approximately 50 % of the time. She states she is not exercising 0 minutes 0 times per week.  Amy Mooney is a 58 year old female with obesity who presents for follow-up of her obesity treatment plan.  She has been following a category two plan aimed at promoting gradual weight loss. She experiences stress eating, particularly at work, which she attributes to job-related stress. Her stress eating primarily involves nuts, and she tries to avoid eating after supper. She recently traveled to [place], which may have contributed to some dietary deviations.  Repeat IC done today: Previous 1786/ Today's REE 1800- No significant change.  A repeat metabolism study was conducted today, showing a resting energy expenditure of 1800 calories, slightly higher than the predicted value and her previous study in August 2023, which was 1786 calories.  She is not currently exercising, although she intends to walk during lunch breaks. She does not have an office and works at the [employer] station, which may contribute to her stress levels. She mentions that she does not usually eat the food brought by reps to the clinic.  Her medical history includes prediabetes, hypertension, vitamin D deficiency, and hyperlipidemia. Her current medications include metformin 500 mg twice daily, phentermine 8 mg twice daily for appetite suppression, hydrochlorothiazide 25 mg once daily, minoxidil 1.25 mg daily, and ergocalciferol 50,000 units once weekly.  OBJECTIVE: Visit Diagnoses: Problem List Items Addressed This Visit     Essential hypertension   Vitamin D  deficiency   Obesity (HCC)- Start BMI 46.77   Relevant Medications   Phentermine HCl ([name]) 8 MG TABS   metFORMIN (GLUCOPHAGE) 500 MG tablet   Other Visit Diagnoses       SOB (shortness of breath) on exertion    -  Primary     Polyphagia       Relevant Medications   Phentermine HCl ([name]) 8 MG TABS     Pre-diabetes       Relevant Medications   metFORMIN (GLUCOPHAGE) 500 MG tablet     BMI 40.0-44.9, adult (HCC) Current BMI 41.6       Relevant Medications   Phentermine HCl ([name]) 8 MG TABS   metFORMIN (GLUCOPHAGE) 500 MG tablet     SOBOE Alexsys does feel that she gets out of breath more easily that she used to when she exercises. Denisa's shortness of breath appears to be obesity related and exercise induced. She has agreed to work on weight loss and gradually increase exercise to treat her exercise induced shortness of breath. Will continue to monitor closely.  Repeat IC today: showing a resting energy expenditure of 1800 calories, slightly higher than the predicted value and her previous study in August 2023, which was 1786 calories. She should continue to promote gradual weight loss with a Cat. 2 plan which is ~ 1200-1300 calories and 85 grams of protein daily.   Obesity with polyphagia She is on a category two plan aimed at promoting gradual weight loss. Her resting energy expenditure is 1800 calories, slightly higher than the predicted value and her previous measurement. Stress eating, particularly at work, is  a challenge. She is currently on phentermine for appetite suppression and metformin for prediabetes. The category two plan is designed to create a calorie deficit of 400-500 calories daily, which should promote weight loss if adhered to. Stress management strategies, including dietary adjustments and physical activity, are recommended to address stress eating. - Continue category two nutrition plan to maintain a calorie deficit of 400-500 calories daily - Refill [name]  8 mg BID I have consulted the [place] Controlled Substances Registry for this patient, and feel the risk/benefit ratio today is favorable for proceeding with this prescription for a controlled substance. No aberrancies noted.  - Encourage coupling nuts with protein-rich snacks like string cheese or drinkable yogurt - Limit nut consumption to a maximum of a quarter cup daily - Encourage walking for 10 minutes after dinner three times a week  Prediabetes She is on metformin 500 mg twice daily. Current stress levels are high, and increasing the dosage is not preferred to avoid gastrointestinal side effects. Lab Results  Component Value Date   HGBA1C 5.6 11/09/2023   HGBA1C 6.0 (H) 02/23/2023   HGBA1C 6.2 11/03/2022   Lab Results  Component Value Date   LDLCALC 89 11/09/2023   CREATININE 0.69 11/09/2023   Continue working on nutrition plan to decrease simple carbohydrates, increase lean proteins and exercise to promote weight loss, improve glycemic control and prevent progression to Type 2 diabetes.  - Continue/refill metformin 500 mg twice daily  Hypertension She is on hydrochlorothiazide 25 mg once daily and minoxidil 1.25 mg daily. BP slightly up today. Increased work related stress. Monitor BP on phentermine as well. BP Readings from Last 3 Encounters:  12/25/23 138/88  11/21/23 130/80  11/09/23 138/84  Continue usual medications.  Continue to work on nutrition plan to promote weight loss and improve BP control.     Vitamin D deficiency She is on ergocalciferol 50,000 units once weekly. No N/V or muscle weakness.  Last vitamin D Lab Results  Component Value Date   VD25OH 28.61 (L) 11/09/2023   Low vitamin D levels can be associated with adiposity and may result in leptin resistance and weight gain. Also associated with fatigue.  Currently on vitamin D supplementation without any adverse effects such as nausea, vomiting or muscle weakness.  Continue Ergocalciferol once weekly. No  refill needed this visit.    Vitals Temp: 97.6 F (36.4 C) BP: 138/88 Pulse Rate: 65 SpO2: 98 %   Anthropometric Measurements Height: 5\' 3"  (1.6 m) Weight: 235 lb (106.6 kg) BMI (Calculated): 41.64 Weight at Last Visit: 231lb Weight Lost Since Last Visit: 0 Weight Gained Since Last Visit: 4lb Starting Weight: 264lb Total Weight Loss (lbs): 29 lb (13.2 kg)   Body Composition  Body Fat %: 51.5 % Fat Mass (lbs): 121 lbs Muscle Mass (lbs): 108.2 lbs Total Body Water (lbs): 88.4 lbs Visceral Fat Rating : 16   Other Clinical Data RMR: 1800 Fasting: yes Labs: yes Today's Visit #: 19 Starting Date: 05/25/22     ASSESSMENT AND PLAN:  Diet: [name] is currently in the action stage of change. As such, her goal is to continue with weight loss efforts and has agreed to the Category 2 Plan.   Exercise:  All adults should avoid inactivity. Some activity is better than none, and adults who participate in any amount of physical activity, gain some health benefits. and For substantial health benefits, adults should do at least 150 minutes (2 hours and 30 minutes) a week of moderate-intensity, or 75 minutes (  1 hour and 15 minutes) a week of vigorous-intensity aerobic physical activity, or an equivalent combination of moderate- and vigorous-intensity aerobic activity. Aerobic activity should be performed in episodes of at least 10 minutes, and preferably, it should be spread throughout the week.  Behavior Modification:  We discussed the following Behavioral Modification Strategies today: increasing lean protein intake, decreasing simple carbohydrates, increasing vegetables, increase H2O intake, increase high fiber foods, no skipping meals, better snacking choices, emotional eating strategies , avoiding temptations, and planning for success. We discussed various medication options to help [facility] with her weight loss efforts and we both agreed to continue metformin for prediabetes and  [name] for polyphagia and continue to work on nutritional and behavioral strategies to promote weight loss.  .  Return in about 4 weeks (around 01/22/2024).Marland Kitchen She was informed of the importance of frequent follow up visits to maximize her success with intensive lifestyle modifications for her multiple health conditions.  Attestation Statements:   Reviewed by clinician on day of visit: allergies, medications, problem list, medical history, surgical history, family history, social history, and previous encounter notes.   Time spent on visit including pre-visit chart review and post-visit care and charting was 39 minutes  Chloe Bluett,PA-C

## 2024-01-01 ENCOUNTER — Other Ambulatory Visit: Payer: Self-pay

## 2024-01-03 ENCOUNTER — Other Ambulatory Visit: Payer: Self-pay

## 2024-01-04 ENCOUNTER — Other Ambulatory Visit: Payer: Self-pay

## 2024-01-10 DIAGNOSIS — M25562 Pain in left knee: Secondary | ICD-10-CM | POA: Diagnosis not present

## 2024-01-24 ENCOUNTER — Ambulatory Visit (INDEPENDENT_AMBULATORY_CARE_PROVIDER_SITE_OTHER): Admitting: Physician Assistant

## 2024-02-24 NOTE — Progress Notes (Unsigned)
 SUBJECTIVE: Discussed the use of AI scribe software for clinical note transcription with the patient, who gave verbal consent to proceed.  Chief Complaint: Obesity  Interim History: She is up 3 lbs since her last visit. She just returned from a very enjoyable vacation.  Down 26 lbs overall TBW loss of 9.85% Amy Mooney is here to discuss her progress with her obesity treatment plan. She is on the Category 2 Plan and states she is following her eating plan approximately 50 % of the time. She states she is not exercising 0 minutes 0 times per week. Amy Mooney is a 58 year old female who presents for follow-up of her obesity treatment plan.  She has experienced a slight weight gain since her last visit. She is currently taking phentermine  8 mg twice daily and ergocalciferol  50,000 units once weekly. She is also on metformin  500 mg twice daily but has been taking the extended-release version once daily due to running out of her regular prescription. She prefers the regular formulation due to fewer gastrointestinal side effects.  She recently returned from a vacation to [place], [place], and [place], where she enjoyed the [employer]. She notes a slight decrease in muscle mass and an increase in adipose tissue. She missed her last month's appointment due to illness and was unable to reschedule promptly due to a busy clinic schedule.  She is almost out of her current medications, including phentermine  and metformin , and has run out of vitamin D . She plans to resume taking metformin  twice daily as she prefers it over the extended-release version due to fewer gastrointestinal side effects.  No side effects reported from her current medications.  Pharmacotherapy: [name]  8 mg twice daily for appetite suppression. No Side effects. Fairly consistent with BID dosing.  I have consulted the [place] Controlled Substances Registry for this patient, and feel the risk/benefit ratio today is favorable for  proceeding with this prescription for a controlled substance. No aberrancies noted.               Metformin  500 mg BID for prediabetes/insulin  resistance. No GI or other side effects.  OBJECTIVE: Visit Diagnoses: Problem List Items Addressed This Visit     Vitamin D  deficiency   Relevant Medications   Vitamin D , Ergocalciferol , (DRISDOL ) 1.25 MG (50000 UNIT) CAPS capsule   Obesity (HCC)- Start BMI 46.77   Relevant Medications   metFORMIN  (GLUCOPHAGE ) 500 MG tablet   Phentermine  HCl ([name] ) 8 MG TABS   Other Visit Diagnoses       Polyphagia    -  Primary   Relevant Medications   Phentermine  HCl ([name] ) 8 MG TABS     Pre-diabetes       Relevant Medications   metFORMIN  (GLUCOPHAGE ) 500 MG tablet     BMI 40.0-44.9, adult (HCC) Current BMI 42.2       Relevant Medications   metFORMIN  (GLUCOPHAGE ) 500 MG tablet   Phentermine  HCl ([name] ) 8 MG TABS      Obesity with polyphagia Weight has increased slightly since the last visit with a decrease in muscle mass and an increase in adipose tissue. She has been on phentermine  ([name] ) 8 mg twice daily and metformin  500 mg twice daily. Due to running out of the regular formulation, she has been taking metformin  extended-release once daily but prefers the regular formulation due to fewer gastrointestinal side effects. She is almost out of metformin . - Continue phentermine  ([name] ) 8 mg twice daily - Prescribe metformin  500 mg twice daily -  Advise to return to twice daily dosing of metformin  - Refill prescriptions at [facility] Meds ordered this encounter  Medications   metFORMIN  (GLUCOPHAGE ) 500 MG tablet    Sig: Take 1 tablet (500 mg total) by mouth 2 (two) times daily with a meal.    Dispense:  60 tablet    Refill:  0   Phentermine  HCl ([name] ) 8 MG TABS    Sig: Take 1 tablet (8 mg total) by mouth 2 (two) times daily.    Dispense:  60 tablet    Refill:  0   Vitamin D , Ergocalciferol , (DRISDOL ) 1.25 MG (50000 UNIT) CAPS capsule     Sig: Take 1 capsule (50,000 Units total) by mouth every 7 (seven) days.    Dispense:  12 capsule    Refill:  0   Prediabetes Last A1c was 5.6- improved  Medication(s): Metformin  500 mg twice daily with meals Polyphagia:No Lab Results  Component Value Date   HGBA1C 5.6 11/09/2023   HGBA1C 6.0 (H) 02/23/2023   HGBA1C 6.2 11/03/2022   HGBA1C 6.2 (H) 05/25/2022   HGBA1C 5.8 (A) 03/04/2021   Lab Results  Component Value Date   INSULIN  41.4 (H) 05/25/2022   INSULIN  22.8 04/21/2019   INSULIN  25.6 (H) 12/17/2018   INSULIN  29.0 (H) 08/19/2018    Plan: Continue and refill Metformin  500 mg twice daily with meals Continue working on nutrition plan to decrease simple carbohydrates, increase lean proteins and exercise to promote weight loss, improve glycemic control and prevent progression to Type 2 diabetes.    Vitamin D  deficiency Managed with ergocalciferol  50,000 units once weekly. She is currently out of vitamin D  supplements. No N/V or muscle weakness with Ergocalciferol  Last vitamin D  Lab Results  Component Value Date   VD25OH 28.61 (L) 11/09/2023    - Prescribe ergocalciferol  50,000 units once weekly Low vitamin D  levels can be associated with adiposity and may result in leptin resistance and weight gain. Also associated with fatigue.  Currently on vitamin D  supplementation without any adverse effects such as nausea, vomiting or muscle weakness.  Meds ordered this encounter  Medications   metFORMIN  (GLUCOPHAGE ) 500 MG tablet    Sig: Take 1 tablet (500 mg total) by mouth 2 (two) times daily with a meal.    Dispense:  60 tablet    Refill:  0   Phentermine  HCl ([name] ) 8 MG TABS    Sig: Take 1 tablet (8 mg total) by mouth 2 (two) times daily.    Dispense:  60 tablet    Refill:  0   Vitamin D , Ergocalciferol , (DRISDOL ) 1.25 MG (50000 UNIT) CAPS capsule    Sig: Take 1 capsule (50,000 Units total) by mouth every 7 (seven) days.    Dispense:  12 capsule    Refill:  0     Vitals Temp: 97.7 F (36.5 C) BP: 138/79 Pulse Rate: 75 SpO2: 98 %   Anthropometric Measurements Height: 5\' 3"  (1.6 m) Weight: 238 lb (108 kg) BMI (Calculated): 42.17 Weight at Last Visit: 235 lb Weight Lost Since Last Visit: 0 Weight Gained Since Last Visit: 3 lb Starting Weight: 264 lb Total Weight Loss (lbs): 26 lb (11.8 kg) Peak Weight: 264 lb   Body Composition  Body Fat %: 52.6 % Fat Mass (lbs): 125.2 lbs Muscle Mass (lbs): 107 lbs Visceral Fat Rating : 17   Other Clinical Data Fasting: yes Labs: no Today's Visit #: 20 Starting Date: 05/25/22     ASSESSMENT AND PLAN:  Diet: Callan  is currently in the action stage of change. As such, her goal is to continue with weight loss efforts. She has agreed to Category 2 Plan.  Exercise: Sangeeta has been instructed to work up to a goal of 150 minutes of combined cardio and strengthening exercise per week for weight loss and overall health benefits.   Behavior Modification:  We discussed the following Behavioral Modification Strategies today: increasing lean protein intake, decreasing simple carbohydrates, increasing vegetables, increase H2O intake, increase high fiber foods, no skipping meals, avoiding temptations, and planning for success. We discussed various medication options to help Ceria with her weight loss efforts and we both agreed to continue current treatment plan and continue to work on nutritional and behavioral strategies to promote weight loss.  .  Return in about 4 weeks (around 03/24/2024).Aaron Aas She was informed of the importance of frequent follow up visits to maximize her success with intensive lifestyle modifications for her multiple health conditions.  Attestation Statements:   Reviewed by clinician on day of visit: allergies, medications, problem list, medical history, surgical history, family history, social history, and previous encounter notes.   Time spent on visit including pre-visit chart  review and post-visit care and charting was 22 minutes.    Ryne Mctigue, PA-C

## 2024-02-25 ENCOUNTER — Ambulatory Visit (INDEPENDENT_AMBULATORY_CARE_PROVIDER_SITE_OTHER): Admitting: Physician Assistant

## 2024-02-25 ENCOUNTER — Other Ambulatory Visit: Payer: Self-pay

## 2024-02-25 ENCOUNTER — Encounter (INDEPENDENT_AMBULATORY_CARE_PROVIDER_SITE_OTHER): Payer: Self-pay | Admitting: Physician Assistant

## 2024-02-25 VITALS — BP 138/79 | HR 75 | Temp 97.7°F | Ht 63.0 in | Wt 238.0 lb

## 2024-02-25 DIAGNOSIS — R7303 Prediabetes: Secondary | ICD-10-CM

## 2024-02-25 DIAGNOSIS — I10 Essential (primary) hypertension: Secondary | ICD-10-CM

## 2024-02-25 DIAGNOSIS — E559 Vitamin D deficiency, unspecified: Secondary | ICD-10-CM | POA: Diagnosis not present

## 2024-02-25 DIAGNOSIS — R632 Polyphagia: Secondary | ICD-10-CM

## 2024-02-25 DIAGNOSIS — Z6841 Body Mass Index (BMI) 40.0 and over, adult: Secondary | ICD-10-CM | POA: Diagnosis not present

## 2024-02-25 DIAGNOSIS — E669 Obesity, unspecified: Secondary | ICD-10-CM | POA: Diagnosis not present

## 2024-02-25 MED ORDER — METFORMIN HCL 500 MG PO TABS
500.0000 mg | ORAL_TABLET | Freq: Two times a day (BID) | ORAL | 0 refills | Status: DC
  Filled 2024-02-25: qty 60, 30d supply, fill #0

## 2024-02-25 MED ORDER — VITAMIN D (ERGOCALCIFEROL) 1.25 MG (50000 UNIT) PO CAPS
50000.0000 [IU] | ORAL_CAPSULE | ORAL | 0 refills | Status: DC
  Filled 2024-02-25: qty 12, 84d supply, fill #0

## 2024-02-25 MED ORDER — LOMAIRA 8 MG PO TABS
8.0000 mg | ORAL_TABLET | Freq: Two times a day (BID) | ORAL | 0 refills | Status: DC
  Filled 2024-02-25: qty 60, 30d supply, fill #0

## 2024-03-13 ENCOUNTER — Other Ambulatory Visit: Payer: Self-pay

## 2024-03-13 MED ORDER — TRIAZOLAM 0.125 MG PO TABS
0.3750 mg | ORAL_TABLET | ORAL | 0 refills | Status: AC
  Filled 2024-03-13: qty 6, 2d supply, fill #0

## 2024-03-24 NOTE — Progress Notes (Deleted)
   SUBJECTIVE: Discussed the use of AI scribe software for clinical note transcription with the patient, who gave verbal consent to proceed.  Chief Complaint: Obesity  Interim History: ***  Amy Mooney is here to discuss her progress with her obesity treatment plan. She is on the {HWW Weight Loss Plan:210964005} and states she {CHL AMB IS/IS NOT:210130109} following her eating plan approximately *** % of the time. She states she {CHL AMB IS/IS NOT:210130109} exercising *** minutes *** times per week.   OBJECTIVE: Visit Diagnoses: Problem List Items Addressed This Visit     Essential hypertension   Other hyperlipidemia   Obesity (HCC)- Start BMI 46.77   Other Visit Diagnoses       Pre-diabetes    -  Primary       No data recorded No data recorded No data recorded No data recorded   ASSESSMENT AND PLAN:  Diet: Amy Mooney {CHL AMB IS/IS NOT:210130109} currently in the action stage of change. As such, her goal is to {HWW Weight Loss Efforts:210964006}. She {HAS HAS WGN:56213} agreed to {HWW Weight Loss Plan:210964005}.  Exercise: Amy Mooney has been instructed {HWW Exercise:210964007} for weight loss and overall health benefits.   Behavior Modification:  We discussed the following Behavioral Modification Strategies today: {HWW Behavior Modification:210964008}. We discussed various medication options to help Amy Mooney with her weight loss efforts and we both agreed to ***.  No follow-ups on file.Amy Mooney She was informed of the importance of frequent follow up visits to maximize her success with intensive lifestyle modifications for her multiple health conditions.  Attestation Statements:   Reviewed by clinician on day of visit: allergies, medications, problem list, medical history, surgical history, family history, social history, and previous encounter notes.   Time spent on visit including pre-visit chart review and post-visit care and charting was *** minutes.    Amy Spiewak, PA-C

## 2024-03-25 ENCOUNTER — Ambulatory Visit (INDEPENDENT_AMBULATORY_CARE_PROVIDER_SITE_OTHER): Admitting: Physician Assistant

## 2024-03-25 ENCOUNTER — Ambulatory Visit (INDEPENDENT_AMBULATORY_CARE_PROVIDER_SITE_OTHER): Admitting: Family Medicine

## 2024-03-27 ENCOUNTER — Encounter: Payer: Self-pay | Admitting: Dermatology

## 2024-03-27 ENCOUNTER — Other Ambulatory Visit: Payer: Self-pay

## 2024-03-27 MED ORDER — AZITHROMYCIN 250 MG PO TABS
ORAL_TABLET | ORAL | 0 refills | Status: AC
  Filled 2024-03-27: qty 6, 5d supply, fill #0

## 2024-04-02 ENCOUNTER — Ambulatory Visit (INDEPENDENT_AMBULATORY_CARE_PROVIDER_SITE_OTHER)

## 2024-04-02 ENCOUNTER — Other Ambulatory Visit: Payer: Self-pay

## 2024-04-02 ENCOUNTER — Ambulatory Visit: Payer: Self-pay | Admitting: Family Medicine

## 2024-04-02 ENCOUNTER — Inpatient Hospital Stay: Admission: RE | Admit: 2024-04-02 | Payer: Self-pay | Source: Ambulatory Visit

## 2024-04-02 ENCOUNTER — Ambulatory Visit
Admission: EM | Admit: 2024-04-02 | Discharge: 2024-04-02 | Disposition: A | Discharge status: Home / Self Care | Attending: Family Medicine | Admitting: Family Medicine

## 2024-04-02 VITALS — BP 142/85 | HR 81 | Temp 98.5°F | Resp 16

## 2024-04-02 DIAGNOSIS — R051 Acute cough: Secondary | ICD-10-CM

## 2024-04-02 DIAGNOSIS — R059 Cough, unspecified: Secondary | ICD-10-CM | POA: Diagnosis not present

## 2024-04-02 DIAGNOSIS — Z87891 Personal history of nicotine dependence: Secondary | ICD-10-CM

## 2024-04-02 DIAGNOSIS — J209 Acute bronchitis, unspecified: Secondary | ICD-10-CM

## 2024-04-02 MED ORDER — ALBUTEROL SULFATE HFA 108 (90 BASE) MCG/ACT IN AERS
2.0000 | INHALATION_SPRAY | RESPIRATORY_TRACT | 0 refills | Status: DC | PRN
  Filled 2024-04-02: qty 6.7, 16d supply, fill #0

## 2024-04-02 MED ORDER — PREDNISONE 10 MG PO TABS
ORAL_TABLET | ORAL | 0 refills | Status: AC
  Filled 2024-04-02: qty 21, 6d supply, fill #0

## 2024-04-02 MED ORDER — PROMETHAZINE-DM 6.25-15 MG/5ML PO SYRP
5.0000 mL | ORAL_SOLUTION | Freq: Four times a day (QID) | ORAL | 0 refills | Status: DC | PRN
  Filled 2024-04-02: qty 118, 6d supply, fill #0

## 2024-04-02 NOTE — Discharge Instructions (Addendum)
 Your chest xray did not show evidence of pneumonia  though the radiologist has not yet read it. If they find something that I didn't, I will call you.    Stop by the pharmacy to pick up your prescriptions.  Follow up with your primary care provider or return to the urgent care, if not improving.

## 2024-04-02 NOTE — ED Provider Notes (Signed)
 [facility]    CSN: [id number] Arrival date & time: 04/02/24  1023      History   Chief Complaint Chief Complaint  Patient presents with   Cough    HPI Amy Mooney is a 58 y.o. female.   HPI  History obtained from the patient. [name] presents for cough that started 10 days ago.  She felt bad over the weekend.  Has dry cough which gets bad at night. She is not able to rest.  Took some Mucinex DM.   Took Azithromycin  that didn't take it away. No fever. Feels short of breath after walking for a long time.   Wheezing occurred over the weekend.    Former smoker.  Denies history of asthma or COPD.     Past Medical History:  Diagnosis Date   Allergy    Anemia 04/30/2019   Chronic knee pain    arthritis   Class 3 severe obesity with serious comorbidity and body mass index (BMI) of 45.0 to 49.9 in adult 06/08/2022   Dysplastic nevus 02/13/2021   right upper back paraspinal, mod to severe atypia    Dysplastic nevus 10/06/2021   left of midline ant base of neck, moderate   Fever blister    history of   Grief reaction 06/07/2018   Heartburn    History of kidney stones    Hypertension    Migraines    Morbid obesity with BMI of 40.0-44.9, adult (HCC)    Narcolepsy without cataplexy(347.00)    Oral herpes    Osteoarthritis    Ovarian torsion 03/22/2017   Palpitations    Pre-diabetes    Primary osteoarthritis of left knee    Restless leg    S/P TKR (total knee replacement), left 05/27/2019 07/10/2019   S/P total knee replacement, right 10/29/18    Sleep apnea    per pt not treated; not able to use CPAP due to claustrophobia   Syncope 11/18/2019   Tinea of nail 09/03/2020   Vitamin D  deficiency     Patient Active Problem List   Diagnosis Date Noted   Loose left total knee arthroplasty (HCC) 10/03/2023   Elevated liver function tests 01/10/2023   Obesity (HCC)- Start BMI 46.77 01/10/2023   H/O cold sores 10/28/2021   Osteoarthritis of both hands  10/28/2021   Varicose veins of lower extremity with pain, bilateral 08/11/2019   Depression 02/03/2019   Insulin  resistance 02/03/2019   Vitamin D  deficiency 12/17/2018   Other hyperlipidemia 12/17/2018   Essential hypertension 11/26/2018   Class 3 severe obesity with serious comorbidity and body mass index (BMI) of 40.0 to 44.9 in adult 11/26/2018   S/P total knee replacement, right 10/29/18    Colon cancer screening    Polyp of sigmoid colon    Prediabetes 05/04/2017   Obstructive sleep apnea syndrome 05/17/2016   Preventative health care 03/30/2016   Restless leg 03/19/2016   Severe obesity (BMI >= 40) (HCC) 06/24/2014   Migraine without aura 02/21/2013   Narcolepsy without cataplexy 02/21/2013    Past Surgical History:  Procedure Laterality Date   ABDOMINAL HYSTERECTOMY  2010   artroscopic meniscus repair Bilateral    cervix removed, bladder tack     2015   CESAREAN SECTION     COLONOSCOPY WITH PROPOFOL  N/A 06/19/2017   Procedure: COLONOSCOPY WITH PROPOFOL ;  Surgeon: Marnee Sink, MD;  Location: [facility];  Service: Endoscopy;  Laterality: N/A;   LAPAROSCOPIC SALPINGO OOPHERECTOMY Right 03/22/2017   Procedure: LAPAROSCOPIC  SALPINGO OOPHORECTOMY;  Surgeon: Ward, Margarie Shay, MD;  Location: [facility];  Service: Gynecology;  Laterality: Right;   TOTAL KNEE ARTHROPLASTY Right 10/29/2018   Procedure: TOTAL KNEE ARTHROPLASTY;  Surgeon: Darrin Emerald, MD;  Location: [facility];  Service: Orthopedics;  Laterality: Right;   TOTAL KNEE ARTHROPLASTY Left 05/27/2019   Procedure: TOTAL KNEE ARTHROPLASTY;  Surgeon: Darrin Emerald, MD;  Location: [facility];  Service: Orthopedics;  Laterality: Left;   TOTAL KNEE REVISION Left 10/08/2023   Procedure: REVISION LEFT KNEE ARTHROPLASTY;  Surgeon: Wendolyn Hamburger, MD;  Location: [facility];  Service: Orthopedics;  Laterality: Left;    OB History     Gravida  2   Para      Term      Preterm      AB      Living  2      SAB      IAB       Ectopic      Multiple      Live Births               Home Medications    Prior to Admission medications   Medication Sig Start Date End Date Taking? Authorizing Provider  albuterol  (VENTOLIN  HFA) 108 (90 Base) MCG/ACT inhaler Inhale 2 puffs into the lungs every 4 (four) hours as needed. 04/02/24  Yes Verdis Bassette, DO  cholecalciferol  (VITAMIN D3) 25 MCG (1000 UNIT) tablet Take 1,000 Units by mouth at bedtime.   Yes [provider]  hydrochlorothiazide  (HYDRODIURIL ) 25 MG tablet Take 1 tablet (25 mg total) by mouth daily for blood pressure. 12/24/23 12/23/24 Yes Clark, Katherine K, NP  loratadine  (CLARITIN ) 10 MG tablet Take 10 mg by mouth daily as needed for allergies.   Yes [provider]  metFORMIN  (GLUCOPHAGE ) 500 MG tablet Take 1 tablet (500 mg total) by mouth 2 (two) times daily with a meal. 02/25/24  Yes Rayburn, Evangelina Hilt, PA-C  minoxidil  (LONITEN ) 2.5 MG tablet Take 0.5 tablets (1.25 mg total) by mouth daily. 12/05/23  Yes Dellar Fenton, DO  Phentermine  HCl ([name] ) 8 MG TABS Take 1 tablet (8 mg total) by mouth 2 (two) times daily. 02/25/24  Yes Rayburn, Evangelina Hilt, PA-C  pramipexole  (MIRAPEX ) 0.25 MG tablet Take 1 tablet (0.25 mg total) by mouth at bedtime. For restless legs. 12/24/23 12/23/24 Yes Clark, Katherine K, NP  predniSONE  (DELTASONE ) 10 MG tablet Take 6 tablets (60 mg total) by mouth daily for 1 day, THEN 5 tablets (50 mg total) daily for 1 day, THEN 4 tablets (40 mg total) daily for 1 day, THEN 3 tablets (30 mg total) daily for 1 day, THEN 2 tablets (20 mg total) daily for 1 day, THEN 1 tablet (10 mg total) daily for 1 day. Take 6 tabs by mouth daily for 1, then 5 tabs for 1 day, then 4 tabs for 1 day, then 3 tabs for 1 day, then 2 tabs for 1 day, then 1 tab for 1 day.. 04/02/24 04/08/24 Yes Myrah Strawderman, DO  promethazine -dextromethorphan (PROMETHAZINE -DM) 6.25-15 MG/5ML syrup Take 5 mLs by mouth 4 (four) times daily as needed. 04/02/24   Yes Donja Tipping, DO  Vitamin D , Ergocalciferol , (DRISDOL ) 1.25 MG (50000 UNIT) CAPS capsule Take 1 capsule (50,000 Units total) by mouth every 7 (seven) days. 02/25/24  Yes Rayburn, Evangelina Hilt, PA-C  aspirin  EC 81 MG tablet Take 1 tablet (81 mg total) by mouth 2 (two) times daily. 10/08/23   Sheron Dixons,  PA-C  azithromycin  (ZITHROMAX ) 250 MG tablet Take 2 tablets (500 mg total) by mouth daily for 1 day, THEN 1 tablet (250 mg total) daily for 4 days. 03/27/24 04/02/24  Paci, Karina M, MD  clobetasol  cream (TEMOVATE ) 0.05 % Apply 1 Application topically 2 (two) times daily. Avoid applying to face, groin, and axilla. Patient taking differently: Apply 1 Application topically 2 (two) times daily as needed (Avoid applying to face, groin, and axilla.). 07/31/22   Artemio Larry, MD  methocarbamol  (ROBAXIN ) 500 MG tablet Take 1 tablet (500 mg total) by mouth 4 (four) times daily. 10/08/23   Sheron Dixons, PA-C  rizatriptan  (MAXALT ) 10 MG tablet Take 1 tablet by mouth at migraine onset.  May repeat in 2 hours if needed.  Do not exceed 2 tablets in 24 hours 08/23/22   Clark, Katherine K, NP  Safety Seal Miscellaneous MISC Apply 1 Application topically in the morning. Medication Name: Hormonic Hair Minoxidil  8%, Finasteride 0.05% 10/03/23   Dellar Fenton, DO  triazolam  (HALCION ) 0.125 MG tablet Take 3 tablets (0.375 mg total) by mouth 1 hour prior to dental appointment. 12/10/23     valACYclovir  (VALTREX ) 1000 MG tablet Take 2 tablets (2,000 mg total) by mouth as directed at onset of blister and 2 tablets 12 hours later 10/04/23   Dellar Fenton, DO    Family History Family History  Problem Relation Age of Onset   Breast cancer Mother 21   Diabetes Mother    Depression Mother    Anxiety disorder Mother    Obesity Mother    Breast cancer Paternal Grandmother     Social History Social History   Tobacco Use   Smoking status: Former    Current packs/day: 0.00    Average packs/day: 0.5  packs/day for 4.0 years (2.0 ttl pk-yrs)    Types: Cigarettes    Start date: 10/23/1982    Quit date: 10/23/1986    Years since quitting: 37.4   Smokeless tobacco: Never  Vaping Use   Vaping status: Never Used  Substance Use Topics   Alcohol use: No   Drug use: No     Allergies   Armodafinil, Penicillins, Vancomycin , Lyrica  [pregabalin ], and Bupropion    Review of Systems Review of Systems: negative unless otherwise stated in HPI.      Physical Exam Triage Vital Signs ED Triage Vitals  Encounter Vitals Group     BP      Systolic BP Percentile      Diastolic BP Percentile      Pulse      Resp      Temp      Temp src      SpO2      Weight      Height      Head Circumference      Peak Flow      Pain Score      Pain Loc      Pain Education      Exclude from Growth Chart    No data found.  Updated Vital Signs BP (!) 142/85 (BP Location: Left Arm)   Pulse 81   Temp 98.5 F (36.9 C) (Oral)   Resp 16   SpO2 93%   Visual Acuity Right Eye Distance:   Left Eye Distance:   Bilateral Distance:    Right Eye Near:   Left Eye Near:    Bilateral Near:     Physical Exam GEN:     alert, non-toxic  appearing female in no distress    HENT:  mucus membranes moist, no nasal discharge EYES:   no scleral injection or discharge RESP:  no increased work of breathing, clear to auscultation bilaterally, frequent dry cough CVS:   regular rate and rhythm Skin:   warm and dry, no rash on visible skin    UC Treatments / Results  Labs (all labs ordered are listed, but only abnormal results are displayed) Labs Reviewed - No data to display  EKG   Radiology DG Chest 2 View Result Date: 04/02/2024 CLINICAL DATA:  dry cough for 10 days EXAM: CHEST - 2 VIEW COMPARISON:  January 26, 2022 FINDINGS: No focal airspace consolidation, pleural effusion, or pneumothorax. No cardiomegaly. No acute fracture or destructive lesion. Multilevel degenerative disc disease of the spine. IMPRESSION:  No acute cardiopulmonary abnormality. Electronically Signed   By: Rance Burrows M.D.   On: 04/02/2024 11:45    Procedures Procedures (including critical care time)  Medications Ordered in UC Medications - No data to display  Initial Impression / Assessment and Plan / UC Course  I have reviewed the triage vital signs and the nursing notes.  Pertinent labs & imaging results that were available during my care of the patient were reviewed by me and considered in my medical decision making (see chart for details).       Pt is a 58 y.o. female who presents for 1-2 weeks of cough that is not improving.  [place] is afebrile here without recent antipyretics. Satting adequately on room air. Overall pt is  non-toxic appearing, well hydrated, without respiratory distress. Pulmonary exam is remarkable for rhonchi that clear with cough.  After shared decision making, we will pursue chest x-ray at this time as it currently would not change management.  COVID  and influenza testing deferred due to length of symptoms.  Chest xray personally reviewed by me without focal pneumonia, pleural effusion, cardiomegaly or pneumothorax. Patient aware the radiologist has not read her xray and is comfortable with the preliminary read by me. Will review radiologist read when available and call patient if a change in plan is warranted.  Pt agreeable to this plan prior to discharge.   Pt is a former smoker. Treat acute bronchitis with steroids as below. She just completed azithromycin  without relief.  Promethazine  DM cough syrup given for cough and allow patient to rest.  Albuterol  inhaler for bronchospasm. Typical duration of symptoms discussed. Return and ED precautions given and patient voiced understanding.   Discussed MDM, treatment plan and plan for follow-up with patient who agrees with plan.      Final Clinical Impressions(s) / UC Diagnoses   Final diagnoses:  Acute bronchitis, unspecified organism  Former  smoker  Acute cough     Discharge Instructions      Your chest xray did not show evidence of pneumonia though the radiologist has not yet read it. If they find something that I didn't, I will call you.    Stop by the pharmacy to pick up your prescriptions.  Follow up with your primary care provider or return to the urgent care, if not improving.      ED Prescriptions     Medication Sig Dispense Auth. Provider   predniSONE  (DELTASONE ) 10 MG tablet Take 6 tablets (60 mg total) by mouth daily for 1 day, THEN 5 tablets (50 mg total) daily for 1 day, THEN 4 tablets (40 mg total) daily for 1 day, THEN 3 tablets (30 mg total)  daily for 1 day, THEN 2 tablets (20 mg total) daily for 1 day, THEN 1 tablet (10 mg total) daily for 1 day.  21 tablet Star Cheese, DO   promethazine -dextromethorphan (PROMETHAZINE -DM) 6.25-15 MG/5ML syrup Take 5 mLs by mouth 4 (four) times daily as needed. 118 mL Francys Bolin, DO   albuterol  (VENTOLIN  HFA) 108 (90 Base) MCG/ACT inhaler Inhale 2 puffs into the lungs every 4 (four) hours as needed. 6.7 g Constantina Laseter, DO      PDMP not reviewed this encounter.   Carleigh Buccieri, DO 04/02/24 1159

## 2024-04-02 NOTE — ED Triage Notes (Signed)
 Cough x 10 days. Tried mucinex.   Pt took a home Covid test and was negative.

## 2024-04-04 ENCOUNTER — Other Ambulatory Visit: Payer: Self-pay

## 2024-04-07 ENCOUNTER — Other Ambulatory Visit: Payer: Self-pay | Admitting: Orthopedic Surgery

## 2024-04-07 DIAGNOSIS — M19049 Primary osteoarthritis, unspecified hand: Secondary | ICD-10-CM

## 2024-04-08 ENCOUNTER — Other Ambulatory Visit: Payer: Self-pay | Admitting: Orthopedic Surgery

## 2024-04-08 ENCOUNTER — Other Ambulatory Visit: Payer: Self-pay

## 2024-04-08 DIAGNOSIS — M19049 Primary osteoarthritis, unspecified hand: Secondary | ICD-10-CM

## 2024-04-09 ENCOUNTER — Other Ambulatory Visit: Payer: Self-pay

## 2024-04-09 MED FILL — Diclofenac Potassium Tab 50 MG: ORAL | 45 days supply | Qty: 90 | Fill #0 | Status: AC

## 2024-04-23 ENCOUNTER — Ambulatory Visit (INDEPENDENT_AMBULATORY_CARE_PROVIDER_SITE_OTHER): Admitting: Physician Assistant

## 2024-04-25 ENCOUNTER — Other Ambulatory Visit: Payer: Self-pay | Admitting: Medical Genetics

## 2024-05-01 ENCOUNTER — Other Ambulatory Visit
Admission: RE | Admit: 2024-05-01 | Discharge: 2024-05-01 | Disposition: A | Discharge status: Home / Self Care | Payer: Self-pay | Source: Ambulatory Visit | Attending: Medical Genetics | Admitting: Medical Genetics

## 2024-05-02 ENCOUNTER — Other Ambulatory Visit

## 2024-05-02 ENCOUNTER — Ambulatory Visit
Admission: RE | Admit: 2024-05-02 | Discharge: 2024-05-02 | Disposition: A | Discharge status: Home / Self Care | Source: Ambulatory Visit | Attending: Primary Care | Admitting: Primary Care

## 2024-05-02 DIAGNOSIS — Z1231 Encounter for screening mammogram for malignant neoplasm of breast: Secondary | ICD-10-CM | POA: Insufficient documentation

## 2024-05-07 ENCOUNTER — Ambulatory Visit: Payer: Self-pay | Admitting: Primary Care

## 2024-05-16 LAB — GENECONNECT MOLECULAR SCREEN: Genetic Analysis Overall Interpretation: NEGATIVE

## 2024-06-03 MED FILL — Diclofenac Potassium Tab 50 MG: ORAL | 45 days supply | Qty: 90 | Fill #1 | Status: AC

## 2024-06-18 ENCOUNTER — Ambulatory Visit: Admitting: Dermatology

## 2024-06-30 ENCOUNTER — Other Ambulatory Visit: Payer: Self-pay

## 2024-06-30 MED ORDER — MINOXIDIL 2.5 MG PO TABS
1.2500 mg | ORAL_TABLET | Freq: Every day | ORAL | 6 refills | Status: AC
  Filled 2024-06-30: qty 30, 60d supply, fill #0
  Filled 2024-08-28: qty 30, 60d supply, fill #1
  Filled 2024-10-28: qty 30, 60d supply, fill #2

## 2024-08-14 ENCOUNTER — Encounter: Admitting: Dermatology

## 2024-08-28 MED FILL — Diclofenac Potassium Tab 50 MG: ORAL | 45 days supply | Qty: 90 | Fill #2 | Status: AC

## 2024-09-04 ENCOUNTER — Encounter: Payer: Self-pay | Admitting: Dermatology

## 2024-09-04 ENCOUNTER — Other Ambulatory Visit: Payer: Self-pay | Admitting: Dermatology

## 2024-09-04 ENCOUNTER — Ambulatory Visit: Admitting: Dermatology

## 2024-09-04 VITALS — BP 121/85 | HR 72

## 2024-09-04 DIAGNOSIS — L910 Hypertrophic scar: Secondary | ICD-10-CM | POA: Diagnosis not present

## 2024-09-04 DIAGNOSIS — D2261 Melanocytic nevi of right upper limb, including shoulder: Secondary | ICD-10-CM

## 2024-09-04 DIAGNOSIS — D239 Other benign neoplasm of skin, unspecified: Secondary | ICD-10-CM

## 2024-09-04 DIAGNOSIS — L988 Other specified disorders of the skin and subcutaneous tissue: Secondary | ICD-10-CM | POA: Diagnosis not present

## 2024-09-04 MED ORDER — TRIAMCINOLONE ACETONIDE 10 MG/ML IJ SUSP
5.0000 mg | Freq: Once | INTRAMUSCULAR | Status: AC
  Administered 2024-09-04: 5 mg

## 2024-09-04 NOTE — Patient Instructions (Signed)

## 2024-09-04 NOTE — Progress Notes (Signed)
 Follow-Up Visit   Subjective  Amy Mooney is a 58 y.o. female who presents for the following: Excision of Dysplastic Nevus with Severe atypia on the right upper arm posterior, biopsied by Dr. Alm. She developed a keloid at the site of the previous biopsy.   The following portions of the chart were reviewed this encounter and updated as appropriate: medications, allergies, medical history  Review of Systems:  No other skin or systemic complaints except as noted in HPI or Assessment and Plan.  Objective  Well appearing patient in no apparent distress; mood and affect are within normal limits.  A focused examination was performed of the following areas: Right upper arm posterior Relevant physical exam findings are noted in the Assessment and Plan.   Right Upper Arm - Posterior Biopsy scar   Assessment & Plan   DYSPLASTIC NEVUS Right Upper Arm - Posterior Skin excision - Right Upper Arm - Posterior  Excision method:  elliptical Lesion length (cm):  1.3 Lesion width (cm):  0.9 Margin per side (cm):  0.5 Total excision diameter (cm):  2.3 Informed consent: discussed and consent obtained   Timeout: patient name, date of birth, surgical site, and procedure verified   Procedure prep:  Patient was prepped and draped in usual sterile fashion Prep type:  Chlorhexidine  Anesthesia: the lesion was anesthetized in a standard fashion   Anesthetic:  1% lidocaine  w/ epinephrine  1-100,000 buffered w/ 8.4% NaHCO3 Instrument used: #15 blade   Hemostasis achieved with: suture, pressure and electrodesiccation   Outcome: patient tolerated procedure well with no complications   Post-procedure details: sterile dressing applied and wound care instructions given   Dressing type: bandage and pressure dressing    Skin repair - Right Upper Arm - Posterior Complexity:  Complex Final length (cm):  5.3 Informed consent: discussed and consent obtained   Timeout: patient name, date of birth,  surgical site, and procedure verified   Procedure prep:  Patient was prepped and draped in usual sterile fashion Prep type:  Chlorhexidine  Anesthesia: the lesion was anesthetized in a standard fashion   Anesthetic:  1% lidocaine  w/ epinephrine  1-100,000 buffered w/ 8.4% NaHCO3 Reason for type of repair: reduce the risk of dehiscence, infection, and necrosis, allow closure of the large defect and preserve normal anatomy   Undermining: area extensively undermined   Subcutaneous layers (deep stitches):  Suture size:  3-0 Suture type: PDS (polydioxanone)   Stitches:  Buried vertical mattress Fine/surface layer approximation (top stitches):  Suture type: cyanoacrylate tissue glue   Hemostasis achieved with: suture, pressure and electrodesiccation Outcome: patient tolerated procedure well with no complications   Post-procedure details: sterile dressing applied and wound care instructions given   Dressing type: pressure dressing (Steri Strips)    Specimen 1 - Surgical pathology Differential Diagnosis: DN [id number] Check Margins: No KELOID Right Upper Arm - Posterior Intralesional injection - Right Upper Arm - Posterior Procedure Note Intralesional Injection  Location: right upper arm posterior  Informed Consent: Discussed risks (infection, pain, bleeding, bruising, thinning of the skin, loss of skin pigment, lack of resolution, and recurrence of lesion) and benefits of the procedure, as well as the alternatives. Informed consent was obtained. Preparation: The area was prepared a standard fashion.  Anesthesia:Lidocaine  1% with epinephrine   Procedure Details: An intralesional injection was performed with Kenalog  5 mg/cc. 1 cc in total were injected. NDC #: 9996-9505-79 Exp: 01/19/2025  Total number of injections: 1  Plan: The patient was instructed on post-op care. Recommend OTC analgesia as  needed for pain.   Related Medications triamcinolone  acetonide (KENALOG ) 10 MG/ML  injection 5 mg   The surgical wound was then cleaned, prepped, and re-anesthetized as above. Wound edges were undermined extensively along at least one entire edge and at a distance equal to or greater than the width of the defect (see wound defect size above) in order to achieve closure and decrease wound tension and anatomic distortion. Redundant tissue repair including standing cone removal was performed. Hemostasis was achieved with electrocautery. Subcutaneous and epidermal tissues were approximated with the above sutures. The surgical site was then lightly scrubbed with sterile, saline-soaked gauze. Steri-strips were applied, and the area was then bandaged using Vaseline ointment, non-adherent gauze, gauze pads, and tape to provide an adequate pressure dressing. The patient tolerated the procedure well, was given detailed written and verbal wound care instructions, and was discharged in good condition.   Dysplastic Nevus with Moderate to Severe Atypia Exam: left lateral chest  Treatment Plan: Excision to be scheduled.  Return for next excision.  LILLETTE Rollene Gobble, RN, am acting as scribe for RUFUS CHRISTELLA HOLY, MD .   Documentation: I have reviewed the above documentation for accuracy and completeness, and I agree with the above.  RUFUS CHRISTELLA HOLY, MD

## 2024-09-08 ENCOUNTER — Ambulatory Visit: Payer: Self-pay | Admitting: Dermatology

## 2024-09-08 LAB — DERMATOLOGY PATHOLOGY

## 2024-09-15 ENCOUNTER — Other Ambulatory Visit: Payer: Self-pay | Admitting: Primary Care

## 2024-09-15 DIAGNOSIS — I10 Essential (primary) hypertension: Secondary | ICD-10-CM

## 2024-09-15 DIAGNOSIS — G2581 Restless legs syndrome: Secondary | ICD-10-CM

## 2024-09-16 ENCOUNTER — Other Ambulatory Visit: Payer: Self-pay

## 2024-09-16 MED ORDER — HYDROCHLOROTHIAZIDE 25 MG PO TABS
25.0000 mg | ORAL_TABLET | Freq: Every day | ORAL | 0 refills | Status: AC
Start: 2024-09-16 — End: 2025-09-16
  Filled 2024-09-16: qty 90, 90d supply, fill #0

## 2024-09-16 MED ORDER — PRAMIPEXOLE DIHYDROCHLORIDE 0.25 MG PO TABS
0.2500 mg | ORAL_TABLET | Freq: Every day | ORAL | 0 refills | Status: AC
Start: 2024-09-16 — End: 2025-09-16
  Filled 2024-09-16: qty 90, 90d supply, fill #0

## 2024-10-08 ENCOUNTER — Ambulatory Visit: Admitting: Dermatology

## 2024-10-08 ENCOUNTER — Other Ambulatory Visit (HOSPITAL_COMMUNITY): Payer: Self-pay

## 2024-10-08 ENCOUNTER — Encounter: Payer: Self-pay | Admitting: Dermatology

## 2024-10-08 VITALS — BP 132/78 | HR 75 | Temp 98.5°F

## 2024-10-08 DIAGNOSIS — L988 Other specified disorders of the skin and subcutaneous tissue: Secondary | ICD-10-CM | POA: Diagnosis not present

## 2024-10-08 DIAGNOSIS — D239 Other benign neoplasm of skin, unspecified: Secondary | ICD-10-CM

## 2024-10-08 DIAGNOSIS — I872 Venous insufficiency (chronic) (peripheral): Secondary | ICD-10-CM | POA: Diagnosis not present

## 2024-10-08 DIAGNOSIS — L910 Hypertrophic scar: Secondary | ICD-10-CM | POA: Diagnosis not present

## 2024-10-08 DIAGNOSIS — D225 Melanocytic nevi of trunk: Secondary | ICD-10-CM | POA: Diagnosis not present

## 2024-10-08 MED ORDER — TRIAMCINOLONE ACETONIDE 10 MG/ML IJ SUSP
10.0000 mg | Freq: Once | INTRAMUSCULAR | Status: AC
  Administered 2024-10-08: 10:00:00 10 mg

## 2024-10-08 MED ORDER — CLOBETASOL PROPIONATE 0.05 % EX CREA
1.0000 | TOPICAL_CREAM | Freq: Two times a day (BID) | CUTANEOUS | 0 refills | Status: AC
  Filled 2024-10-08 – 2024-10-10 (×2): qty 30, 30d supply, fill #0

## 2024-10-08 MED FILL — Diclofenac Potassium Tab 50 MG: ORAL | 45 days supply | Qty: 90 | Fill #3 | Status: AC

## 2024-10-08 NOTE — Patient Instructions (Signed)

## 2024-10-08 NOTE — Progress Notes (Addendum)
 Follow-Up Visit   Subjective  Amy Mooney is a 58 y.o. female who presents for the following: Excision of Dysplastic Nevus with Moderate to Severe Atypia on the Left Lateral Chest, biopsied by Delon Lenis, MD on 10/02/2024. She had a keloid in the area after the initial biopsy.  The following portions of the chart were reviewed this encounter and updated as appropriate: medications, allergies, medical history  Review of Systems:  No other skin or systemic complaints except as noted in HPI or Assessment and Plan.  Objective  Well appearing patient in no apparent distress; mood and affect are within normal limits.  A focused examination was performed of the following areas: Left lateral chest Relevant physical exam findings are noted in the Assessment and Plan.  left lateral chest Biopsy scar   Assessment & Plan   DYSPLASTIC NEVUS left lateral chest - Skin excision - left lateral chest  Excision method:  elliptical Lesion length (cm):  1.6 Lesion width (cm):  0.7 Margin per side (cm):  0.5 Total excision diameter (cm):  2.6 Informed consent: discussed and consent obtained   Timeout: patient name, date of birth, surgical site, and procedure verified   Procedure prep:  Patient was prepped and draped in usual sterile fashion Prep type:  Chlorhexidine  Anesthesia: the lesion was anesthetized in a standard fashion   Anesthetic:  1% lidocaine  w/ epinephrine  1-100,000 buffered w/ 8.4% NaHCO3 Instrument used: #15 blade   Hemostasis achieved with: suture, pressure and electrodesiccation   Outcome: patient tolerated procedure well with no complications   Post-procedure details: sterile dressing applied and wound care instructions given   Dressing type: pressure dressing and bandage (Steri Strips)    - Skin repair - left lateral chest Complexity:  Complex Final length (cm):  5.6 Informed consent: discussed and consent obtained   Timeout: patient name, date of birth, surgical  site, and procedure verified   Procedure prep:  Patient was prepped and draped in usual sterile fashion Prep type:  Chlorhexidine  Anesthesia: the lesion was anesthetized in a standard fashion   Anesthetic:  1% lidocaine  w/ epinephrine  1-100,000 buffered w/ 8.4% NaHCO3 (10cc) Reason for type of repair: reduce the risk of dehiscence, infection, and necrosis, allow closure of the large defect and preserve normal anatomy   Undermining: area extensively undermined   Subcutaneous layers (deep stitches):  Suture size:  3-0 Suture type: Monocryl (poliglecaprone 25)   Stitches:  Buried vertical mattress Fine/surface layer approximation (top stitches):  Suture type: cyanoacrylate tissue glue   Hemostasis achieved with: suture, pressure and electrodesiccation Outcome: patient tolerated procedure well with no complications   Post-procedure details: sterile dressing applied and wound care instructions given   Dressing type: pressure dressing and bandage (Steri Strips)    Specimen 1 - Surgical pathology Differential Diagnosis: DN [id number] Check Margins: No KELOID Left Breast - Intralesional injection - Left Breast Procedure Note Intralesional Injection  Location: left lateral chest  Informed Consent: Discussed risks (infection, pain, bleeding, bruising, thinning of the skin, loss of skin pigment, lack of resolution, and recurrence of lesion) and benefits of the procedure, as well as the alternatives. Informed consent was obtained. Preparation: The area was prepared a standard fashion.  Anesthesia:none  Procedure Details: An intralesional injection was performed with Kenalog  10 mg/cc. 1 cc in total were injected. [facility] #: [id number] Exp: March 2026  Total number of injections: 1  Plan: The patient was instructed on post-op care. Recommend OTC analgesia as needed for pain.   This Visit -  triamcinolone  acetonide (KENALOG ) 10 MG/ML injection 10 mg  The surgical wound was then  cleaned, prepped, and re-anesthetized as above. Wound edges were undermined extensively along at least one entire edge and at a distance equal to or greater than the width of the defect (see wound defect size above) in order to achieve closure and decrease wound tension and anatomic distortion. Redundant tissue repair including standing cone removal was performed. Hemostasis was achieved with electrocautery. Subcutaneous and epidermal tissues were approximated with the above sutures. The surgical site was then lightly scrubbed with sterile, saline-soaked gauze. Steri-strips were applied, and the area was then bandaged using Vaseline ointment, non-adherent gauze, gauze pads, and tape to provide an adequate pressure dressing. The patient tolerated the procedure well, was given detailed written and verbal wound care instructions, and was discharged in good condition.   STASIS DERMATITIS- right lower leg, s/p Hip replacement Exam: Erythematous, scaly patches involving the ankle and distal lower leg with associated lower leg edema.  Not at goal  Stasis in the legs causes chronic leg swelling, which may result in itchy or painful rashes, skin discoloration, skin texture changes, and sometimes ulceration.  Recommend daily graduated compression hose/stockings- easiest to put on first thing in morning, remove at bedtime.  Elevate legs as much as possible. Avoid salt/sodium rich foods.  Treatment Plan: Clobetasol  cream BID   The patient will follow-up: PRN.  LILLETTE Rollene Gobble, RN, am acting as scribe for RUFUS CHRISTELLA HOLY, MD .  Documentation: I have reviewed the above documentation for accuracy and completeness, and I agree with the above.  RUFUS CHRISTELLA HOLY, MD

## 2024-10-08 NOTE — Addendum Note (Signed)
 Addended by: COREY RUFUS HERO on: 10/08/2024 02:37 PM   Modules accepted: Level of Service

## 2024-10-09 DIAGNOSIS — H5213 Myopia, bilateral: Secondary | ICD-10-CM | POA: Diagnosis not present

## 2024-10-09 LAB — SURGICAL PATHOLOGY

## 2024-10-10 ENCOUNTER — Other Ambulatory Visit (HOSPITAL_COMMUNITY): Payer: Self-pay

## 2024-10-10 ENCOUNTER — Ambulatory Visit: Payer: 59 | Admitting: Orthopedic Surgery

## 2024-10-10 ENCOUNTER — Encounter: Payer: Self-pay | Admitting: Orthopedic Surgery

## 2024-10-10 ENCOUNTER — Other Ambulatory Visit: Payer: Self-pay

## 2024-10-10 ENCOUNTER — Encounter: Payer: Self-pay | Admitting: Pharmacist

## 2024-10-10 DIAGNOSIS — M1711 Unilateral primary osteoarthritis, right knee: Secondary | ICD-10-CM

## 2024-10-10 DIAGNOSIS — Z96651 Presence of right artificial knee joint: Secondary | ICD-10-CM | POA: Diagnosis not present

## 2024-10-10 DIAGNOSIS — T84498D Other mechanical complication of other internal orthopedic devices, implants and grafts, subsequent encounter: Secondary | ICD-10-CM

## 2024-10-10 DIAGNOSIS — Z96652 Presence of left artificial knee joint: Secondary | ICD-10-CM | POA: Diagnosis not present

## 2024-10-10 DIAGNOSIS — M1712 Unilateral primary osteoarthritis, left knee: Secondary | ICD-10-CM

## 2024-10-10 MED FILL — Diclofenac Potassium Tab 50 MG: ORAL | 45 days supply | Qty: 90 | Fill #0 | Status: AC

## 2024-10-10 NOTE — Progress Notes (Signed)
" ° ° °  10/10/2024   Chief Complaint  Patient presents with   Routine Post Op    10/29/18 right knee replacement  05/27/19 left knee replacement , revision left knee 10/08/23     No diagnosis found.  What pharmacy do you use ? [facility]   DOI/DOS/ Date: R 10/29/18, L 05/27/19  Did you get better, worse or no change (Answer below)   Improved      "

## 2024-10-10 NOTE — Progress Notes (Signed)
 ANNUAL FOLLOW UP FOR right TKA   Chief Complaint  Patient presents with   Routine Post Op    10/29/18 right knee replacement  05/27/19 left knee replacement, left total knee tibial revision Dr. Karlis 2024    Mild pain at the tip of the left tibial stem and mild anterior knee pain right knee  HPI: The patient is here for the annual  follow-up x-ray for knee replacement. The patient is not complaining of pain weakness instability or stiffness in the repaired knee.   ROS status post left total knee revision of the tibial implant by Dr. Liam December of last year     Examination of the right KNEE  There were no vitals taken for this visit. General the patient is normally groomed in no distress Inspection shows : incision healed nicely without erythema, no tenderness no swelling Range of motion total range of motion is 120 Stability the knee is stable anterior to posterior as well as medial to lateral Strength quadriceps strength is normal Skin no erythema around the skin incision Neuro: normal sensation in the operative leg  Gait: normal expected gait without cane    Medical decision-making section X-rays ordered, internal imaging shows (see full dictated report) stable implant with no signs of loosening  Diagnosis  Encounter Diagnoses  Name Primary?   S/P total knee replacement, right Yes   S/P revision of total knee, left    Primary osteoarthritis of left knee    Primary osteoarthritis of right knee    Failed orthopedic implant, subsequent encounter      Plan follow-up 2 year repeat x-rays

## 2024-10-13 ENCOUNTER — Other Ambulatory Visit: Payer: Self-pay

## 2024-10-14 ENCOUNTER — Encounter: Admitting: Dermatology

## 2024-11-14 ENCOUNTER — Encounter: Payer: Self-pay | Admitting: Primary Care

## 2024-11-14 ENCOUNTER — Other Ambulatory Visit: Payer: Self-pay

## 2024-11-14 ENCOUNTER — Ambulatory Visit: Payer: Self-pay | Admitting: Primary Care

## 2024-11-14 ENCOUNTER — Ambulatory Visit: Payer: 59 | Admitting: Primary Care

## 2024-11-14 VITALS — BP 118/72 | HR 62 | Temp 97.8°F | Ht 62.5 in | Wt 257.2 lb

## 2024-11-14 DIAGNOSIS — E66813 Obesity, class 3: Secondary | ICD-10-CM | POA: Diagnosis not present

## 2024-11-14 DIAGNOSIS — E7849 Other hyperlipidemia: Secondary | ICD-10-CM | POA: Diagnosis not present

## 2024-11-14 DIAGNOSIS — M19042 Primary osteoarthritis, left hand: Secondary | ICD-10-CM | POA: Diagnosis not present

## 2024-11-14 DIAGNOSIS — Z6841 Body Mass Index (BMI) 40.0 and over, adult: Secondary | ICD-10-CM | POA: Diagnosis not present

## 2024-11-14 DIAGNOSIS — G2581 Restless legs syndrome: Secondary | ICD-10-CM | POA: Diagnosis not present

## 2024-11-14 DIAGNOSIS — M19041 Primary osteoarthritis, right hand: Secondary | ICD-10-CM | POA: Diagnosis not present

## 2024-11-14 DIAGNOSIS — I10 Essential (primary) hypertension: Secondary | ICD-10-CM | POA: Diagnosis not present

## 2024-11-14 DIAGNOSIS — Z8619 Personal history of other infectious and parasitic diseases: Secondary | ICD-10-CM

## 2024-11-14 DIAGNOSIS — G43001 Migraine without aura, not intractable, with status migrainosus: Secondary | ICD-10-CM

## 2024-11-14 DIAGNOSIS — G43009 Migraine without aura, not intractable, without status migrainosus: Secondary | ICD-10-CM | POA: Diagnosis not present

## 2024-11-14 DIAGNOSIS — Z0000 Encounter for general adult medical examination without abnormal findings: Secondary | ICD-10-CM

## 2024-11-14 DIAGNOSIS — F3289 Other specified depressive episodes: Secondary | ICD-10-CM

## 2024-11-14 DIAGNOSIS — R7303 Prediabetes: Secondary | ICD-10-CM

## 2024-11-14 DIAGNOSIS — G4733 Obstructive sleep apnea (adult) (pediatric): Secondary | ICD-10-CM

## 2024-11-14 DIAGNOSIS — Z1231 Encounter for screening mammogram for malignant neoplasm of breast: Secondary | ICD-10-CM

## 2024-11-14 LAB — LIPID PANEL
Cholesterol: 155 mg/dL (ref 28–200)
HDL: 53.4 mg/dL
LDL Cholesterol: 80 mg/dL (ref 10–99)
NonHDL: 101.18
Total CHOL/HDL Ratio: 3
Triglycerides: 105 mg/dL (ref 10.0–149.0)
VLDL: 21 mg/dL (ref 0.0–40.0)

## 2024-11-14 LAB — COMPREHENSIVE METABOLIC PANEL WITH GFR
ALT: 25 U/L (ref 3–35)
AST: 23 U/L (ref 5–37)
Albumin: 4.3 g/dL (ref 3.5–5.2)
Alkaline Phosphatase: 94 U/L (ref 39–117)
BUN: 23 mg/dL (ref 6–23)
CO2: 31 meq/L (ref 19–32)
Calcium: 9.9 mg/dL (ref 8.4–10.5)
Chloride: 103 meq/L (ref 96–112)
Creatinine, Ser: 0.78 mg/dL (ref 0.40–1.20)
GFR: 83.73 mL/min
Glucose, Bld: 89 mg/dL (ref 70–99)
Potassium: 4 meq/L (ref 3.5–5.1)
Sodium: 141 meq/L (ref 135–145)
Total Bilirubin: 1 mg/dL (ref 0.2–1.2)
Total Protein: 6.8 g/dL (ref 6.0–8.3)

## 2024-11-14 LAB — HEMOGLOBIN A1C: Hgb A1c MFr Bld: 6 % (ref 4.6–6.5)

## 2024-11-14 MED ORDER — RIZATRIPTAN BENZOATE 10 MG PO TABS
ORAL_TABLET | ORAL | 0 refills | Status: AC
  Filled 2024-11-14: qty 10, 20d supply, fill #0

## 2024-11-14 NOTE — Assessment & Plan Note (Signed)
 Repeat A1C pending.  Work on a healthy diet and regular exercise in order for weight loss, and to reduce the risk of further co-morbidity.

## 2024-11-14 NOTE — Assessment & Plan Note (Signed)
Controlled. ? ?Continue hydrochlorothiazide 25 mg daily. ?CMP pending. ?

## 2024-11-14 NOTE — Assessment & Plan Note (Signed)
 Controlled.  Continue rizatriptan  10 mg PRN.

## 2024-11-14 NOTE — Progress Notes (Signed)
 "  Subjective:    Patient ID: Amy Mooney, female    DOB: 30-Jun-1966, 59 y.o.   MRN: [id number]  Amy Mooney is a very pleasant 59 y.o. female who presents today for complete physical and follow up of chronic conditions.  Immunizations: -Tetanus: Completed in 2017 -Influenza: completed this season  -Shingles: Never completed Shingrix series   Diet: Fair diet.  Exercise: No regular exercise.  Eye exam: Completes annually  Dental exam: Completes semi-annually    Pap Smear: Hysterectomy  Mammogram: Completed in July 2025  Colonoscopy: Completed in 2018, due 2028   BP Readings from Last 3 Encounters:  11/14/24 118/72  10/08/24 132/78  09/04/24 121/85   Wt Readings from Last 3 Encounters:  11/14/24 257 lb 4 oz (116.7 kg)  02/25/24 238 lb (108 kg)  12/25/23 235 lb (106.6 kg)   Body mass index is 46.3 kg/m.    Review of Systems  Constitutional:  Negative for unexpected weight change.  HENT:  Negative for rhinorrhea.   Respiratory:  Negative for cough and shortness of breath.   Cardiovascular:  Negative for chest pain.  Gastrointestinal:  Negative for constipation and diarrhea.  Genitourinary:  Negative for difficulty urinating.  Musculoskeletal:  Negative for arthralgias and myalgias.  Skin:  Negative for rash.  Allergic/Immunologic: Negative for environmental allergies.  Neurological:  Negative for dizziness and headaches.  Psychiatric/Behavioral:  The patient is not nervous/anxious.          Past Medical History:  Diagnosis Date   Allergy    Anemia 04/30/2019   Cataract    Chronic knee pain    arthritis   Class 3 severe obesity with serious comorbidity and body mass index (BMI) of 45.0 to 49.9 in adult [facility]) 06/08/2022   Dysplastic nevus 02/13/2021   right upper back paraspinal, mod to severe atypia    Dysplastic nevus 10/06/2021   left of midline ant base of neck, moderate   Dysplastic nevus 10/03/2023   moderate to severe atypia- left lateral  chest- Tx Excision Dr. Corey 10/08/2024   Dysplastic nevus 10/03/2023   with severe atypia- right upper arm- posterior- tx excision Dr. Corey 11.13.2025   Dysplastic nevus 01/21/2007   moderate to severe atypia- right superior mid calf- excised 01/2007   Dysplastic nevus 12/26/2007   moderate atypia- left medial distal pretibial- observe   Dysplastic nevus 12/26/2007   moderate to marked atypia- right distal lateral forearm   Dysplastic nevus 12/26/2007   moderate atypia- left distal medial dorsum forearm- observe   Dysplastic nevus    Dysplastic nevus 12/26/2007   moderate to marked atypia- left mid medial calf   Dysplastic nevus 12/26/2007   moderate atypia- right medial anterior ankle- observe   Dysplastic nevus 12/26/2007   moderate to marked aypia- right larteral pretibial below knee-   Fever blister    history of   Grief reaction 06/07/2018   Heartburn    History of kidney stones    Hypertension    Migraines    Morbid obesity with BMI of 40.0-44.9, adult (HCC)    Narcolepsy without cataplexy(347.00)    Oral herpes    Osteoarthritis    Ovarian torsion 03/22/2017   Palpitations    Pre-diabetes    Primary osteoarthritis of left knee    Restless leg    S/P TKR (total knee replacement), left 05/27/2019 07/10/2019   S/P total knee replacement, right 10/29/18    Sleep apnea    per pt not treated; not able  to use CPAP due to claustrophobia   Syncope 11/18/2019   Tinea of nail 09/03/2020   Vitamin D  deficiency     Social History   Socioeconomic History   Marital status: Married    Spouse name: Yalexa Blust   Number of children: 2   Years of education: [employer] education level: Not on file  Occupational History   Occupation: CMA    Employer: [employer]  Tobacco Use   Smoking status: Former    Current packs/day: 0.00    Average packs/day: 0.5 packs/day for 4.0 years (2.0 ttl pk-yrs)    Types: Cigarettes    Start date: 10/23/1982    Quit date: 10/23/1986     Years since quitting: 38.0   Smokeless tobacco: Never  Vaping Use   Vaping status: Never Used  Substance and Sexual Activity   Alcohol use: No   Drug use: No   Sexual activity: Yes    Birth control/protection: None  Other Topics Concern   Not on file  Social History Narrative   Patient lives at home with her husband Leafy)   Patient has two adult children.   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   Patient has an college education.   Patient is right-handed.   Patient drinks very little caffeine.   Enjoys spending time with family.    Social Drivers of Health   Tobacco Use: Medium Risk (11/14/2024)   Patient History    Smoking Tobacco Use: Former    Smokeless Tobacco Use: Never    Passive Exposure: Not on file  Financial Resource Strain: Not on file  Food Insecurity: Not on file  Transportation Needs: Not on file  Physical Activity: Not on file  Stress: Not on file  Social Connections: Not on file  Intimate Partner Violence: Not on file  Depression (PHQ2-9): Low Risk (11/14/2024)   Depression (PHQ2-9)    PHQ-2 Score: 0  Alcohol Screen: Not on file  Housing: Not on file  Utilities: Not on file  Health Literacy: Not on file    Past Surgical History:  Procedure Laterality Date   ABDOMINAL HYSTERECTOMY  2010   artroscopic meniscus repair Bilateral    cervix removed, bladder tack     2015   CESAREAN SECTION     COLONOSCOPY WITH PROPOFOL  N/A 06/19/2017   Procedure: COLONOSCOPY WITH PROPOFOL ;  Surgeon: Jinny Carmine, MD;  Location: [facility];  Service: Endoscopy;  Laterality: N/A;   LAPAROSCOPIC SALPINGO OOPHERECTOMY Right 03/22/2017   Procedure: LAPAROSCOPIC SALPINGO OOPHORECTOMY;  Surgeon: Ward, Mitzie BROCKS, MD;  Location: [facility];  Service: Gynecology;  Laterality: Right;   TOTAL KNEE ARTHROPLASTY Right 10/29/2018   Procedure: TOTAL KNEE ARTHROPLASTY;  Surgeon: Margrette Taft BRAVO, MD;  Location: [facility];  Service: Orthopedics;  Laterality: Right;    TOTAL KNEE ARTHROPLASTY Left 05/27/2019   Procedure: TOTAL KNEE ARTHROPLASTY;  Surgeon: Margrette Taft BRAVO, MD;  Location: [facility];  Service: Orthopedics;  Laterality: Left;   TOTAL KNEE REVISION Left 10/08/2023   Procedure: REVISION LEFT KNEE ARTHROPLASTY;  Surgeon: Liam Lerner, MD;  Location: [facility];  Service: Orthopedics;  Laterality: Left;    Family History  Problem Relation Age of Onset   Breast cancer Mother 85   Diabetes Mother    Depression Mother    Anxiety disorder Mother    Obesity Mother    Cancer Mother    Breast cancer Paternal Grandmother    Stroke Paternal Grandmother    Arthritis Maternal  Grandfather     Allergies[1]  Medications Ordered Prior to Encounter[2]  BP 118/72   Pulse 62   Temp 97.8 F (36.6 C) (Oral)   Ht 5' 2.5 (1.588 m)   Wt 257 lb 4 oz (116.7 kg)   SpO2 98%   BMI 46.30 kg/m  Objective:   Physical Exam HENT:     Right Ear: Tympanic membrane and ear canal normal.     Left Ear: Tympanic membrane and ear canal normal.  Eyes:     Pupils: Pupils are equal, round, and reactive to light.  Cardiovascular:     Rate and Rhythm: Normal rate and regular rhythm.  Pulmonary:     Effort: Pulmonary effort is normal.     Breath sounds: Normal breath sounds.  Abdominal:     General: Bowel sounds are normal.     Palpations: Abdomen is soft.     Tenderness: There is no abdominal tenderness.  Musculoskeletal:        General: Normal range of motion.     Cervical back: Neck supple.  Skin:    General: Skin is warm and dry.  Neurological:     Mental Status: She is alert and oriented to person, place, and time.     Cranial Nerves: No cranial nerve deficit.     Deep Tendon Reflexes:     Reflex Scores:      Patellar reflexes are 2+ on the right side and 2+ on the left side. Psychiatric:        Mood and Affect: Mood normal.     Physical Exam        Assessment & Plan:  Preventative health care Assessment & Plan: Declines Shingrix vaccines.   Mammogram due in July, orders placed. Colonoscopy UTD, due 2028  Discussed the importance of a healthy diet and regular exercise in order for weight loss, and to reduce the risk of further co-morbidity.  Exam stable. Labs pending.  Follow up in 1 year for repeat physical.    Migraine without aura and without status migrainosus, not intractable Assessment & Plan: Controlled.  Continue rizatriptan  10 mg PRN.   Migraine without aura and with status migrainosus, not intractable Assessment & Plan: Controlled.  Continue rizatriptan  10 mg PRN.  Orders: -     Rizatriptan  Benzoate; Take 1 tablet by mouth at migraine onset.  May repeat in 2 hours if needed.  Do not exceed 2 tablets in 24 hours  Dispense: 10 tablet; Refill: 0  Essential hypertension Assessment & Plan: Controlled.  Continue hydrochlorothiazide  25 mg daily. CMP pending.  Orders: -     Comprehensive metabolic panel with GFR  Obstructive sleep apnea syndrome Assessment & Plan: No use of CPAP.   Restless leg Assessment & Plan: Controlled.  Continue Mirapex  0.25 mg HS.   Other hyperlipidemia Assessment & Plan: Repeat lipid panel pending.  Work on a healthy diet and regular exercise in order for weight loss, and to reduce the risk of further co-morbidity.   Orders: -     Lipid panel -     Comprehensive metabolic panel with GFR  Prediabetes Assessment & Plan: Repeat A1C pending.  Work on a healthy diet and regular exercise in order for weight loss, and to reduce the risk of further co-morbidity.   Orders: -     Hemoglobin A1c  H/O cold sores Assessment & Plan: No recent outbreak.  Continue Valtrex  2000 mg BID x 1 day PRN.   Screening mammogram for breast cancer -  3D Screening Mammogram, Left and Right; Future  Other depression Assessment & Plan: No concern today. Continue to monitor.   Class 3 severe obesity due to excess calories without serious comorbidity with body mass  index (BMI) of 45.0 to 49.9 in adult [facility]) Assessment & Plan: Slow regain of weight since off Zepbound  She will look into other options and update   Primary osteoarthritis of both hands Assessment & Plan: Improved and controlled.  Continue diclofenac  50 mg twice daily  Renal function pending     Assessment and Plan Assessment & Plan         Comer MARLA Gaskins, NP       [1]  Allergies Allergen Reactions   Armodafinil Hives and Other (See Comments)   Penicillins Other (See Comments) and Rash    Has patient had a PCN reaction causing immediate rash, facial/tongue/throat swelling, SOB or lightheadedness with hypotension: Unknown  Has patient had a PCN reaction causing severe rash involving mucus membranes or skin necrosis: Unknown  Has patient had a PCN reaction that required hospitalization: Unknown  Has patient had a PCN reaction occurring within the last 10 years: Unknown  If all of the above answers are NO, then may proceed with Cephalosporin use.   Vancomycin  Hives   Lyrica  [Pregabalin ] Hives   Bupropion  Other (See Comments)    Causes migraines  [2]  Current Outpatient Medications on File Prior to Visit  Medication Sig Dispense Refill   Calcium  Carb-Cholecalciferol  (CALCIUM  + D3 PO) Take by mouth daily.     cholecalciferol  (VITAMIN D3) 25 MCG (1000 UNIT) tablet Take 1,000 Units by mouth at bedtime.     clobetasol  cream (TEMOVATE ) 0.05 % Apply 1 Application topically 2 (two) times daily. 30 g 0   diclofenac  (CATAFLAM ) 50 MG tablet Take 1 tablet (50 mg total) by mouth 2 (two) times daily. 90 tablet 3   hydrochlorothiazide  (HYDRODIURIL ) 25 MG tablet Take 1 tablet (25 mg total) by mouth daily for blood pressure. 90 tablet 0   loratadine  (CLARITIN ) 10 MG tablet Take 10 mg by mouth daily as needed for allergies.     minoxidil  (LONITEN ) 2.5 MG tablet Take 0.5 tablets (1.25 mg total) by mouth daily. 30 tablet 6   pramipexole  (MIRAPEX ) 0.25 MG tablet Take 1 tablet  (0.25 mg total) by mouth at bedtime. For restless legs. 90 tablet 0   triazolam  (HALCION ) 0.125 MG tablet Take 3 tablets (0.375 mg total) by mouth 1 hour prior to dental appointment. 6 tablet 0   valACYclovir  (VALTREX ) 1000 MG tablet Take 2 tablets (2,000 mg total) by mouth as directed at onset of blister and 2 tablets 12 hours later 30 tablet 11   No current facility-administered medications on file prior to visit.   "

## 2024-11-14 NOTE — Assessment & Plan Note (Signed)
 Repeat lipid panel pending.  Work on a healthy diet and regular exercise in order for weight loss, and to reduce the risk of further co-morbidity.

## 2024-11-14 NOTE — Assessment & Plan Note (Signed)
Controlled.  Continue Mirapex 0.25 mg HS.

## 2024-11-14 NOTE — Assessment & Plan Note (Signed)
No use of CPAP

## 2024-11-14 NOTE — Assessment & Plan Note (Signed)
 Improved and controlled.  Continue diclofenac  50 mg twice daily  Renal function pending

## 2024-11-14 NOTE — Assessment & Plan Note (Signed)
 Slow regain of weight since off Zepbound  She will look into other options and update

## 2024-11-14 NOTE — Assessment & Plan Note (Signed)
 No recent outbreak.  Continue Valtrex  2000 mg BID x 1 day PRN.

## 2024-11-14 NOTE — Assessment & Plan Note (Signed)
 No concern today. Continue to monitor.

## 2024-11-14 NOTE — Patient Instructions (Signed)
 Stop by the lab prior to leaving today. I will notify you of your results once received.   It was a pleasure to see you today!

## 2024-11-14 NOTE — Assessment & Plan Note (Signed)
 Declines Shingrix vaccines.  Mammogram due in July, orders placed. Colonoscopy UTD, due 2028  Discussed the importance of a healthy diet and regular exercise in order for weight loss, and to reduce the risk of further co-morbidity.  Exam stable. Labs pending.  Follow up in 1 year for repeat physical.

## 2024-11-25 ENCOUNTER — Other Ambulatory Visit (HOSPITAL_COMMUNITY): Payer: Self-pay

## 2024-11-25 ENCOUNTER — Other Ambulatory Visit: Payer: Self-pay

## 2024-11-25 DIAGNOSIS — L709 Acne, unspecified: Secondary | ICD-10-CM

## 2024-11-25 MED ORDER — DOXYCYCLINE HYCLATE 50 MG PO CAPS
50.0000 mg | ORAL_CAPSULE | Freq: Every day | ORAL | 2 refills | Status: AC
  Filled 2024-11-25: qty 90, 90d supply, fill #0

## 2024-11-26 ENCOUNTER — Other Ambulatory Visit: Payer: Self-pay

## 2025-05-08 ENCOUNTER — Encounter

## 2025-11-20 ENCOUNTER — Encounter: Admitting: Primary Care

## 2026-10-15 ENCOUNTER — Ambulatory Visit: Admitting: Orthopedic Surgery
